# Patient Record
Sex: Male | Born: 1935
Health system: Southern US, Community
[De-identification: ages and names within clinical notes are randomized; demographics above are authoritative.]

## PROBLEM LIST (undated history)

## (undated) DIAGNOSIS — D049 Carcinoma in situ of skin, unspecified: Secondary | ICD-10-CM

## (undated) DIAGNOSIS — K5730 Diverticulosis of large intestine without perforation or abscess without bleeding: Secondary | ICD-10-CM

## (undated) DIAGNOSIS — I951 Orthostatic hypotension: Secondary | ICD-10-CM

## (undated) DIAGNOSIS — R55 Syncope and collapse: Secondary | ICD-10-CM

## (undated) DIAGNOSIS — R413 Other amnesia: Secondary | ICD-10-CM

## (undated) DIAGNOSIS — I428 Other cardiomyopathies: Secondary | ICD-10-CM

## (undated) DIAGNOSIS — D649 Anemia, unspecified: Secondary | ICD-10-CM

## (undated) DIAGNOSIS — R42 Dizziness and giddiness: Secondary | ICD-10-CM

## (undated) DIAGNOSIS — M1990 Unspecified osteoarthritis, unspecified site: Secondary | ICD-10-CM

## (undated) DIAGNOSIS — N400 Enlarged prostate without lower urinary tract symptoms: Secondary | ICD-10-CM

## (undated) DIAGNOSIS — I10 Essential (primary) hypertension: Secondary | ICD-10-CM

## (undated) DIAGNOSIS — R001 Bradycardia, unspecified: Secondary | ICD-10-CM

## (undated) DIAGNOSIS — I739 Peripheral vascular disease, unspecified: Secondary | ICD-10-CM

## (undated) DIAGNOSIS — I2510 Atherosclerotic heart disease of native coronary artery without angina pectoris: Secondary | ICD-10-CM

## (undated) DIAGNOSIS — I340 Nonrheumatic mitral (valve) insufficiency: Secondary | ICD-10-CM

## (undated) DIAGNOSIS — I493 Ventricular premature depolarization: Secondary | ICD-10-CM

## (undated) DIAGNOSIS — I4510 Unspecified right bundle-branch block: Secondary | ICD-10-CM

## (undated) HISTORY — DX: Bradycardia, unspecified: R00.1

## (undated) HISTORY — DX: Atherosclerotic heart disease of native coronary artery without angina pectoris: I25.10

## (undated) HISTORY — DX: Carcinoma in situ of skin, unspecified: D04.9

## (undated) HISTORY — DX: Peripheral vascular disease, unspecified: I73.9

## (undated) HISTORY — DX: Nonrheumatic mitral (valve) insufficiency: I34.0

## (undated) HISTORY — DX: Ventricular premature depolarization: I49.3

## (undated) HISTORY — DX: Other cardiomyopathies: I42.8

## (undated) HISTORY — PX: OTHER SURGICAL HISTORY: SHX169

## (undated) HISTORY — DX: Benign prostatic hyperplasia without lower urinary tract symptoms: N40.0

## (undated) HISTORY — DX: Dizziness and giddiness: R42

## (undated) HISTORY — DX: Other amnesia: R41.3

## (undated) HISTORY — DX: Anemia, unspecified: D64.9

## (undated) HISTORY — PX: APPENDECTOMY: SHX54

## (undated) HISTORY — DX: Essential (primary) hypertension: I10

## (undated) HISTORY — DX: Syncope and collapse: R55

## (undated) HISTORY — DX: Diverticulosis of large intestine without perforation or abscess without bleeding: K57.30

## (undated) HISTORY — DX: Unspecified right bundle-branch block: I45.10

## (undated) HISTORY — DX: Unspecified osteoarthritis, unspecified site: M19.90

## (undated) HISTORY — DX: Orthostatic hypotension: I95.1

---

## 1995-08-23 ENCOUNTER — Encounter: Payer: Self-pay | Admitting: Family Medicine

## 1997-03-24 ENCOUNTER — Encounter: Payer: Self-pay | Admitting: Family Medicine

## 1997-11-22 ENCOUNTER — Encounter: Payer: Self-pay | Admitting: Family Medicine

## 1999-02-15 ENCOUNTER — Encounter (INDEPENDENT_AMBULATORY_CARE_PROVIDER_SITE_OTHER): Payer: Self-pay | Admitting: Specialist

## 1999-02-15 ENCOUNTER — Encounter: Payer: Self-pay | Admitting: Emergency Medicine

## 1999-02-16 ENCOUNTER — Inpatient Hospital Stay (HOSPITAL_COMMUNITY): Admission: EM | Admit: 1999-02-16 | Discharge: 1999-02-17 | Payer: Self-pay | Admitting: Emergency Medicine

## 1999-04-25 ENCOUNTER — Encounter: Payer: Self-pay | Admitting: Family Medicine

## 2000-04-24 ENCOUNTER — Encounter: Payer: Self-pay | Admitting: Family Medicine

## 2001-05-22 ENCOUNTER — Encounter: Payer: Self-pay | Admitting: Family Medicine

## 2002-03-24 HISTORY — PX: OTHER SURGICAL HISTORY: SHX169

## 2002-05-23 ENCOUNTER — Encounter: Payer: Self-pay | Admitting: Family Medicine

## 2003-05-23 ENCOUNTER — Encounter: Payer: Self-pay | Admitting: Family Medicine

## 2003-05-23 LAB — CONVERTED CEMR LAB: PSA: 0.9 ng/mL

## 2004-03-24 ENCOUNTER — Encounter: Payer: Self-pay | Admitting: Family Medicine

## 2004-03-24 LAB — CONVERTED CEMR LAB: PSA: 1.15 ng/mL

## 2004-03-27 ENCOUNTER — Ambulatory Visit: Payer: Self-pay | Admitting: Family Medicine

## 2004-03-29 ENCOUNTER — Ambulatory Visit: Payer: Self-pay | Admitting: Family Medicine

## 2004-05-23 ENCOUNTER — Ambulatory Visit: Payer: Self-pay | Admitting: Family Medicine

## 2004-10-10 ENCOUNTER — Emergency Department (HOSPITAL_COMMUNITY): Admission: EM | Admit: 2004-10-10 | Discharge: 2004-10-11 | Payer: Self-pay | Admitting: Emergency Medicine

## 2004-10-23 ENCOUNTER — Ambulatory Visit: Payer: Self-pay | Admitting: Family Medicine

## 2004-11-08 ENCOUNTER — Ambulatory Visit (HOSPITAL_COMMUNITY): Admission: RE | Admit: 2004-11-08 | Discharge: 2004-11-08 | Payer: Self-pay | Admitting: Gastroenterology

## 2004-11-08 LAB — HM COLONOSCOPY

## 2004-11-27 ENCOUNTER — Ambulatory Visit: Payer: Self-pay | Admitting: Family Medicine

## 2004-11-29 ENCOUNTER — Ambulatory Visit: Payer: Self-pay | Admitting: Cardiology

## 2004-12-19 ENCOUNTER — Ambulatory Visit: Payer: Self-pay

## 2005-01-13 ENCOUNTER — Ambulatory Visit: Payer: Self-pay | Admitting: Family Medicine

## 2005-01-16 ENCOUNTER — Ambulatory Visit: Payer: Self-pay | Admitting: Family Medicine

## 2005-01-30 ENCOUNTER — Ambulatory Visit: Payer: Self-pay | Admitting: Cardiology

## 2005-04-28 ENCOUNTER — Ambulatory Visit: Payer: Self-pay | Admitting: Family Medicine

## 2005-04-29 ENCOUNTER — Ambulatory Visit: Payer: Self-pay | Admitting: Family Medicine

## 2005-08-22 ENCOUNTER — Ambulatory Visit: Payer: Self-pay | Admitting: Family Medicine

## 2005-09-07 ENCOUNTER — Emergency Department (HOSPITAL_COMMUNITY): Admission: EM | Admit: 2005-09-07 | Discharge: 2005-09-07 | Payer: Self-pay | Admitting: Emergency Medicine

## 2005-09-19 ENCOUNTER — Ambulatory Visit: Payer: Self-pay | Admitting: Family Medicine

## 2005-09-19 LAB — CONVERTED CEMR LAB: PSA: 1.37 ng/mL

## 2005-09-22 ENCOUNTER — Ambulatory Visit: Payer: Self-pay | Admitting: Family Medicine

## 2005-10-10 ENCOUNTER — Ambulatory Visit: Payer: Self-pay | Admitting: Family Medicine

## 2005-11-13 ENCOUNTER — Ambulatory Visit: Payer: Self-pay | Admitting: Family Medicine

## 2006-05-02 ENCOUNTER — Ambulatory Visit: Payer: Self-pay | Admitting: Family Medicine

## 2006-10-05 ENCOUNTER — Ambulatory Visit: Payer: Self-pay | Admitting: Family Medicine

## 2006-10-05 LAB — CONVERTED CEMR LAB
AST: 21 units/L (ref 0–37)
Albumin: 3.4 g/dL — ABNORMAL LOW (ref 3.5–5.2)
Alkaline Phosphatase: 54 units/L (ref 39–117)
Chloride: 104 meq/L (ref 96–112)
Creatinine, Ser: 1.5 mg/dL (ref 0.4–1.5)
Creatinine,U: 236.6 mg/dL
HDL: 69.6 mg/dL (ref >39.0)
PSA: 1.62 ng/mL (ref 0.10–4.00)
Sodium: 139 meq/L (ref 135–145)
TSH: 1.51 microintl units/mL (ref 0.35–5.50)
Total Bilirubin: 0.8 mg/dL (ref 0.3–1.2)
VLDL: 10 mg/dL (ref 0–40)

## 2006-10-07 ENCOUNTER — Encounter: Payer: Self-pay | Admitting: Family Medicine

## 2006-10-07 DIAGNOSIS — L719 Rosacea, unspecified: Secondary | ICD-10-CM | POA: Insufficient documentation

## 2006-10-07 DIAGNOSIS — N400 Enlarged prostate without lower urinary tract symptoms: Secondary | ICD-10-CM

## 2006-10-07 DIAGNOSIS — I739 Peripheral vascular disease, unspecified: Secondary | ICD-10-CM | POA: Insufficient documentation

## 2006-10-07 DIAGNOSIS — J309 Allergic rhinitis, unspecified: Secondary | ICD-10-CM | POA: Insufficient documentation

## 2006-10-07 DIAGNOSIS — E749 Disorder of carbohydrate metabolism, unspecified: Secondary | ICD-10-CM | POA: Insufficient documentation

## 2006-10-07 DIAGNOSIS — M109 Gout, unspecified: Secondary | ICD-10-CM

## 2006-10-07 DIAGNOSIS — K5730 Diverticulosis of large intestine without perforation or abscess without bleeding: Secondary | ICD-10-CM | POA: Insufficient documentation

## 2006-10-09 ENCOUNTER — Ambulatory Visit: Payer: Self-pay | Admitting: Family Medicine

## 2006-10-22 ENCOUNTER — Encounter (INDEPENDENT_AMBULATORY_CARE_PROVIDER_SITE_OTHER): Payer: Self-pay | Admitting: *Deleted

## 2006-10-22 ENCOUNTER — Ambulatory Visit: Payer: Self-pay | Admitting: Family Medicine

## 2006-12-28 ENCOUNTER — Telehealth: Payer: Self-pay | Admitting: Family Medicine

## 2006-12-29 ENCOUNTER — Ambulatory Visit: Payer: Self-pay | Admitting: Family Medicine

## 2006-12-29 DIAGNOSIS — R002 Palpitations: Secondary | ICD-10-CM | POA: Insufficient documentation

## 2007-03-22 ENCOUNTER — Ambulatory Visit: Payer: Self-pay | Admitting: Family Medicine

## 2007-03-22 DIAGNOSIS — K4090 Unilateral inguinal hernia, without obstruction or gangrene, not specified as recurrent: Secondary | ICD-10-CM | POA: Insufficient documentation

## 2007-04-22 ENCOUNTER — Encounter: Payer: Self-pay | Admitting: Family Medicine

## 2007-06-28 ENCOUNTER — Encounter: Admission: RE | Admit: 2007-06-28 | Discharge: 2007-06-28 | Payer: Self-pay | Admitting: Surgery

## 2007-06-30 ENCOUNTER — Encounter: Payer: Self-pay | Admitting: Family Medicine

## 2007-06-30 ENCOUNTER — Ambulatory Visit (HOSPITAL_BASED_OUTPATIENT_CLINIC_OR_DEPARTMENT_OTHER): Admission: RE | Admit: 2007-06-30 | Discharge: 2007-06-30 | Payer: Self-pay | Admitting: Surgery

## 2007-06-30 ENCOUNTER — Encounter (INDEPENDENT_AMBULATORY_CARE_PROVIDER_SITE_OTHER): Payer: Self-pay | Admitting: Surgery

## 2007-07-30 ENCOUNTER — Encounter: Payer: Self-pay | Admitting: Family Medicine

## 2007-08-20 ENCOUNTER — Encounter: Payer: Self-pay | Admitting: Family Medicine

## 2007-10-01 ENCOUNTER — Ambulatory Visit: Payer: Self-pay | Admitting: Family Medicine

## 2007-10-01 DIAGNOSIS — S46212S Strain of muscle, fascia and tendon of other parts of biceps, left arm, sequela: Secondary | ICD-10-CM

## 2007-12-29 ENCOUNTER — Telehealth: Payer: Self-pay | Admitting: Family Medicine

## 2007-12-30 ENCOUNTER — Ambulatory Visit: Payer: Self-pay | Admitting: Family Medicine

## 2008-01-04 ENCOUNTER — Ambulatory Visit: Payer: Self-pay | Admitting: Family Medicine

## 2008-01-05 DIAGNOSIS — E8809 Other disorders of plasma-protein metabolism, not elsewhere classified: Secondary | ICD-10-CM | POA: Insufficient documentation

## 2008-01-05 LAB — CONVERTED CEMR LAB
ALT: 13 units/L (ref 0–53)
Albumin: 3.3 g/dL — ABNORMAL LOW (ref 3.5–5.2)
Calcium: 9 mg/dL (ref 8.4–10.5)
Creatinine, Ser: 1.1 mg/dL (ref 0.4–1.5)
Creatinine,U: 154.4 mg/dL
GFR calc non Af Amer: 70 mL/min
HDL: 66.2 mg/dL (ref >39.0)
LDL Cholesterol: 93 mg/dL (ref 0–99)
Microalb, Ur: 0.5 mg/dL (ref 0.0–1.9)
PSA: 1.35 ng/mL (ref 0.10–4.00)
Total Bilirubin: 0.7 mg/dL (ref 0.3–1.2)
Total CHOL/HDL Ratio: 2.5
Triglycerides: 34 mg/dL (ref 0–149)

## 2008-01-13 ENCOUNTER — Encounter: Payer: Self-pay | Admitting: Family Medicine

## 2008-01-13 LAB — CONVERTED CEMR LAB
Bacteria, UA: 0
Bilirubin Urine: NEGATIVE
Blood in Urine, dipstick: NEGATIVE
Mucus, UA: 0
RBC / HPF: 0
Urine crystals, microscopic: 0 /hpf
Urobilinogen, UA: 0.2
pH: 6.5

## 2008-01-19 ENCOUNTER — Encounter (INDEPENDENT_AMBULATORY_CARE_PROVIDER_SITE_OTHER): Payer: Self-pay | Admitting: *Deleted

## 2008-01-19 ENCOUNTER — Ambulatory Visit: Payer: Self-pay | Admitting: Family Medicine

## 2008-01-19 LAB — CONVERTED CEMR LAB: OCCULT 1: NEGATIVE

## 2008-05-16 ENCOUNTER — Telehealth: Payer: Self-pay | Admitting: Family Medicine

## 2008-05-29 ENCOUNTER — Ambulatory Visit: Payer: Self-pay | Admitting: Family Medicine

## 2008-07-03 ENCOUNTER — Ambulatory Visit: Payer: Self-pay | Admitting: Family Medicine

## 2008-07-10 ENCOUNTER — Ambulatory Visit: Payer: Self-pay | Admitting: Internal Medicine

## 2008-07-10 ENCOUNTER — Encounter: Payer: Self-pay | Admitting: Internal Medicine

## 2008-07-10 DIAGNOSIS — I498 Other specified cardiac arrhythmias: Secondary | ICD-10-CM

## 2008-07-18 ENCOUNTER — Telehealth (INDEPENDENT_AMBULATORY_CARE_PROVIDER_SITE_OTHER): Payer: Self-pay | Admitting: *Deleted

## 2008-07-19 ENCOUNTER — Encounter: Payer: Self-pay | Admitting: Cardiovascular Disease

## 2008-07-19 ENCOUNTER — Ambulatory Visit: Payer: Self-pay

## 2008-07-19 ENCOUNTER — Encounter: Payer: Self-pay | Admitting: Internal Medicine

## 2008-07-19 ENCOUNTER — Telehealth: Payer: Self-pay | Admitting: Internal Medicine

## 2008-07-20 ENCOUNTER — Ambulatory Visit (HOSPITAL_COMMUNITY): Admission: RE | Admit: 2008-07-20 | Discharge: 2008-07-21 | Payer: Self-pay | Admitting: Cardiovascular Disease

## 2008-07-20 ENCOUNTER — Ambulatory Visit: Payer: Self-pay | Admitting: Cardiovascular Disease

## 2008-07-21 ENCOUNTER — Telehealth: Payer: Self-pay | Admitting: Internal Medicine

## 2008-07-24 ENCOUNTER — Ambulatory Visit: Payer: Self-pay | Admitting: Surgery

## 2008-07-24 ENCOUNTER — Telehealth: Payer: Self-pay | Admitting: Family Medicine

## 2008-07-28 ENCOUNTER — Telehealth: Payer: Self-pay | Admitting: Cardiovascular Disease

## 2008-08-25 ENCOUNTER — Ambulatory Visit: Payer: Self-pay | Admitting: Internal Medicine

## 2008-08-25 DIAGNOSIS — I080 Rheumatic disorders of both mitral and aortic valves: Secondary | ICD-10-CM | POA: Insufficient documentation

## 2008-09-04 ENCOUNTER — Ambulatory Visit: Payer: Self-pay | Admitting: Surgery

## 2008-09-20 ENCOUNTER — Telehealth: Payer: Self-pay | Admitting: Internal Medicine

## 2008-09-20 ENCOUNTER — Encounter: Payer: Self-pay | Admitting: Internal Medicine

## 2008-10-25 ENCOUNTER — Encounter (INDEPENDENT_AMBULATORY_CARE_PROVIDER_SITE_OTHER): Payer: Self-pay | Admitting: *Deleted

## 2008-10-26 ENCOUNTER — Ambulatory Visit: Payer: Self-pay | Admitting: Internal Medicine

## 2008-10-26 LAB — CONVERTED CEMR LAB
BUN: 21 mg/dL (ref 6–23)
Basophils Absolute: 0 10*3/uL (ref 0.0–0.1)
CO2: 22 meq/L (ref 19–32)
Chloride: 109 meq/L (ref 96–112)
Glucose, Bld: 90 mg/dL (ref 70–99)
HCT: 36.1 % — ABNORMAL LOW (ref 39.0–52.0)
Hemoglobin: 12.5 g/dL — ABNORMAL LOW (ref 13.0–17.0)
Lymphs Abs: 1 10*3/uL (ref 0.7–4.0)
MCHC: 34.7 g/dL (ref 30.0–36.0)
MCV: 91.4 fL (ref 78.0–100.0)
Monocytes Absolute: 0.4 10*3/uL (ref 0.1–1.0)
Neutro Abs: 1.8 10*3/uL (ref 1.4–7.7)
Platelets: 186 10*3/uL (ref 150.0–400.0)
Potassium: 4.5 meq/L (ref 3.5–5.1)
RDW: 14.2 % (ref 11.5–14.6)
Sodium: 141 meq/L (ref 135–145)
aPTT: 26.6 s (ref 21.7–28.8)

## 2008-11-02 ENCOUNTER — Encounter: Payer: Self-pay | Admitting: Cardiology

## 2008-11-02 ENCOUNTER — Ambulatory Visit (HOSPITAL_COMMUNITY): Admission: RE | Admit: 2008-11-02 | Discharge: 2008-11-02 | Payer: Self-pay | Admitting: Cardiology

## 2008-11-02 ENCOUNTER — Inpatient Hospital Stay (HOSPITAL_BASED_OUTPATIENT_CLINIC_OR_DEPARTMENT_OTHER): Admission: RE | Admit: 2008-11-02 | Discharge: 2008-11-02 | Payer: Self-pay | Admitting: Cardiology

## 2008-11-02 ENCOUNTER — Ambulatory Visit: Payer: Self-pay | Admitting: Cardiology

## 2008-11-15 ENCOUNTER — Ambulatory Visit: Payer: Self-pay | Admitting: Internal Medicine

## 2008-11-15 DIAGNOSIS — I498 Other specified cardiac arrhythmias: Secondary | ICD-10-CM | POA: Insufficient documentation

## 2008-12-11 ENCOUNTER — Ambulatory Visit: Payer: Self-pay | Admitting: Family Medicine

## 2009-01-01 ENCOUNTER — Ambulatory Visit: Payer: Self-pay | Admitting: Family Medicine

## 2009-01-08 ENCOUNTER — Ambulatory Visit: Payer: Self-pay | Admitting: Family Medicine

## 2009-01-08 LAB — CONVERTED CEMR LAB
AST: 20 units/L (ref 0–37)
Albumin: 3.4 g/dL — ABNORMAL LOW (ref 3.5–5.2)
Alkaline Phosphatase: 35 units/L — ABNORMAL LOW (ref 39–117)
BUN: 25 mg/dL — ABNORMAL HIGH (ref 6–23)
Basophils Relative: 0.9 % (ref 0.0–3.0)
CO2: 28 meq/L (ref 19–32)
Calcium: 8.5 mg/dL (ref 8.4–10.5)
Cholesterol: 171 mg/dL (ref 0–200)
Creatinine,U: 181.9 mg/dL
Eosinophils Absolute: 0.2 10*3/uL (ref 0.0–0.7)
Eosinophils Relative: 5.1 % — ABNORMAL HIGH (ref 0.0–5.0)
GFR calc non Af Amer: 57.56 mL/min (ref >60)
Glucose, Bld: 97 mg/dL (ref 70–99)
HCT: 35.5 % — ABNORMAL LOW (ref 39.0–52.0)
Lymphs Abs: 1.1 10*3/uL (ref 0.7–4.0)
MCHC: 34.5 g/dL (ref 30.0–36.0)
MCV: 95.1 fL (ref 78.0–100.0)
Microalb Creat Ratio: 3.3 mg/g (ref 0.0–30.0)
Monocytes Absolute: 0.4 10*3/uL (ref 0.1–1.0)
Neutro Abs: 2.3 10*3/uL (ref 1.4–7.7)
RBC: 3.73 M/uL — ABNORMAL LOW (ref 4.22–5.81)
Sodium: 141 meq/L (ref 135–145)
Total Protein: 5.6 g/dL — ABNORMAL LOW (ref 6.0–8.3)
VLDL: 7.2 mg/dL (ref 0.0–40.0)
WBC: 4 10*3/uL — ABNORMAL LOW (ref 4.5–10.5)

## 2009-01-09 ENCOUNTER — Encounter: Payer: Self-pay | Admitting: Family Medicine

## 2009-01-09 LAB — CONVERTED CEMR LAB: Vit D, 25-Hydroxy: 58 ng/mL (ref 30–89)

## 2009-01-10 ENCOUNTER — Ambulatory Visit: Payer: Self-pay | Admitting: Family Medicine

## 2009-01-18 ENCOUNTER — Ambulatory Visit: Payer: Self-pay | Admitting: Family Medicine

## 2009-01-18 ENCOUNTER — Encounter (INDEPENDENT_AMBULATORY_CARE_PROVIDER_SITE_OTHER): Payer: Self-pay | Admitting: *Deleted

## 2009-01-18 LAB — CONVERTED CEMR LAB
OCCULT 1: NEGATIVE
OCCULT 2: NEGATIVE
OCCULT 3: NEGATIVE

## 2009-01-18 LAB — FECAL OCCULT BLOOD, GUAIAC: Fecal Occult Blood: NEGATIVE

## 2009-03-05 ENCOUNTER — Ambulatory Visit: Payer: Self-pay | Admitting: Family Medicine

## 2009-03-06 ENCOUNTER — Encounter: Payer: Self-pay | Admitting: Family Medicine

## 2009-03-06 LAB — CONVERTED CEMR LAB
Albumin: 3.3 g/dL — ABNORMAL LOW (ref 3.5–5.2)
Alkaline Phosphatase: 52 units/L (ref 39–117)
Basophils Absolute: 0 10*3/uL (ref 0.0–0.1)
Eosinophils Absolute: 0.2 10*3/uL (ref 0.0–0.7)
Hemoglobin: 12.1 g/dL — ABNORMAL LOW (ref 13.0–17.0)
Lymphocytes Relative: 21.3 % (ref 12.0–46.0)
MCHC: 33.8 g/dL (ref 30.0–36.0)
Neutro Abs: 2.8 10*3/uL (ref 1.4–7.7)
Neutrophils Relative %: 63.6 % (ref 43.0–77.0)
Platelets: 204 10*3/uL (ref 150.0–400.0)
RDW: 12.5 % (ref 11.5–14.6)
Total Bilirubin: 1 mg/dL (ref 0.3–1.2)

## 2009-03-12 ENCOUNTER — Ambulatory Visit: Payer: Self-pay | Admitting: Family Medicine

## 2009-03-19 ENCOUNTER — Encounter: Payer: Self-pay | Admitting: Internal Medicine

## 2009-08-07 ENCOUNTER — Ambulatory Visit: Payer: Self-pay | Admitting: Family Medicine

## 2009-08-07 LAB — CONVERTED CEMR LAB
Alkaline Phosphatase: 48 units/L (ref 39–117)
Basophils Absolute: 0 10*3/uL (ref 0.0–0.1)
Bilirubin, Direct: 0.1 mg/dL (ref 0.0–0.3)
CO2: 28 meq/L (ref 19–32)
Calcium: 9.4 mg/dL (ref 8.4–10.5)
Creatinine, Ser: 1.3 mg/dL (ref 0.4–1.5)
Creatinine,U: 349.9 mg/dL
Eosinophils Absolute: 0.2 10*3/uL (ref 0.0–0.7)
Glucose, Bld: 95 mg/dL (ref 70–99)
HDL: 72.9 mg/dL (ref >39.00)
Lymphocytes Relative: 30.8 % (ref 12.0–46.0)
MCHC: 34.4 g/dL (ref 30.0–36.0)
Microalb Creat Ratio: 0.5 mg/g (ref 0.0–30.0)
Microalb, Ur: 1.8 mg/dL (ref 0.0–1.9)
Neutrophils Relative %: 52.6 % (ref 43.0–77.0)
PSA: 1.62 ng/mL (ref 0.10–4.00)
RBC: 3.81 M/uL — ABNORMAL LOW (ref 4.22–5.81)
RDW: 13.2 % (ref 11.5–14.6)
Total Bilirubin: 0.5 mg/dL (ref 0.3–1.2)
Total CHOL/HDL Ratio: 2
Triglycerides: 31 mg/dL (ref 0.0–149.0)
VLDL: 6.2 mg/dL (ref 0.0–40.0)

## 2009-08-09 ENCOUNTER — Telehealth: Payer: Self-pay | Admitting: Internal Medicine

## 2009-08-14 ENCOUNTER — Ambulatory Visit: Payer: Self-pay | Admitting: Family Medicine

## 2009-09-10 ENCOUNTER — Ambulatory Visit: Payer: Self-pay | Admitting: Internal Medicine

## 2009-10-30 ENCOUNTER — Encounter (INDEPENDENT_AMBULATORY_CARE_PROVIDER_SITE_OTHER): Payer: Self-pay | Admitting: *Deleted

## 2010-01-31 ENCOUNTER — Ambulatory Visit: Payer: Self-pay | Admitting: Family Medicine

## 2010-04-23 NOTE — Assessment & Plan Note (Signed)
Summary: CPX / LFW   Vital Signs:  Patient profile:   76 year old male Weight:      164.75 pounds BMI:     24.78 Temp:     97.8 degrees F oral Pulse rate:   84 / minute Pulse rhythm:   irregular BP sitting:   110 / 70  (left arm) Cuff size:   regular  Vitals Entered By: Sydell Axon LPN (08/30/2009 9:21 AM) CC: 30 Minute checkup, had a colonoscopy at [facility] 5-6 years ago?   History of Present Illness: Extremely pleasant pt here fo followup. He feels well and has been stable with weight loss altho he is trying to gain. He still gets slightly dizzy, typically with getting up. His pulse today is higher than usual but BP is great.  He continues with diarrhea almost all the time.    Preventive Screening-Counseling & Management  Alcohol-Tobacco     Alcohol drinks/day: 2     Alcohol type: wine beer     Smoking Status: quit     Year Quit: 1986     Pack years: 60     Passive Smoke Exposure: no  Caffeine-Diet-Exercise     Caffeine use/day: 3     Does Patient Exercise: yes     Type of exercise: walking  calisthenics  1 hour     Times/week: 4  Problems Prior to Update: 1)  Unspecified Anemia  (ICD-285.9) 2)  Premature Ventricular Contractions, Frequent  (ICD-427.69) 3)  Unspecified Systolic Heart Failure  (ICD-428.20) 4)  Mitral Regurgitation  (ICD-396.3) 5)  Bradycardia  (ICD-427.89) 6)  Tendinitis, Right Lower Leg  (ICD-726.90) 7)  Other Disorders of Plasma Protein Metabolism  (ICD-273.8) 8)  Special Screening Malignant Neoplasm of Prostate  (ICD-V76.44) 9)  Biceps Tendon Rupture, Left  (ICD-727.62) 10)  Inguinal Hernia, Left  (ICD-550.90) 11)  Palpitations  (ICD-785.1) 12)  Screening For Malignannt Neoplasm, Site Nec  (ICD-V76.49) 13)  Health Maintenance Exam  (ICD-V70.0) 14)  Rosacea  (ICD-695.3) 15)  Hearing Loss (HFHL)  (ICD-389.9) 16)  Peripheral Vascular Disease  (ICD-443.9) 17)  Hypertension ( BP ELEV SINCE 95- OFF MEDS)  (ICD-401.9) 18)  Diverticulosis, Colon   (ICD-562.10) 19)  Allergic Rhinitis  (ICD-477.9) 20)  Hyprtrphy Prostate Bng w/o Urinary Obst/luts  (ICD-600.00) 21)  Carbohydrate Metabolism Disorder  (ICD-271.9) 22)  Gout  (ICD-274.9) 23)  Hypercholesterolemia, Pure (200/ LDL 117)  (ICD-272.0)  Medications Prior to Update: 1)  Vitamin C 500 Mg  Tabs (Ascorbic Acid) .... Take One By Mouth Daily 2)  Cilostazol 100 Mg Tabs (Cilostazol) .Marland Kitchen.. 1 Tab Two Times A Day 3)  Aspirin Ec 325 Mg Tbec (Aspirin) .... Take One Tablet By Mouth Daily 4)  Calcium Carbonate-Vitamin D 600-400 Mg-Unit  Tabs (Calcium Carbonate-Vitamin D) .... Take 2 Tables Once A Day 5)  Metoprolol Succinate 25 Mg Xr24h-Tab (Metoprolol Succinate) .... Take 1/2 By Mouth Two Times A Day  Allergies: No Known Drug Allergies  Past History:  Past Medical History: Last updated: 11/15/2008 Allergic rhinitis Diverticulosis, colon frequent PVCS asymptomatic bradycardia Gout (remote per pt) Hypertension (pt denies) Peripheral vascular disease Benign prostatic hypertrophy (pt denies) Mitral regurgitation, Pulmonic regurgitation, with marked LA enlargement Nonobstructive CAD  Family History: Last updated: 08/30/09 Father: Died at the age of 1 of a cerebral henmorrhage  HTN Mother: Died at the age of 48 Natural Causes w/CHF Brother died at 29 with an enlarged heart.   Brother A 72  s/p  neck surgery.  Sister died  61  motor vehicle accident.  Prostate Cancer in family uncles  Social History: Last updated: 07/10/2008 Marital Status: Married LIves in [place], [place] Children: 4 Occupation: Production designer, theatre/television/film for [employer]. Tob- 2PPD x 30 years, quit 10/1982 ETOH- 2 beer/wine per day  Risk Factors: Alcohol Use: 2 (08/14/2009) Caffeine Use: 3 (08/14/2009) Exercise: yes (08/14/2009)  Risk Factors: Smoking Status: quit (08/14/2009) Passive Smoke Exposure: no (08/14/2009)  Past Surgical History: Colonocopy 10/95 Madilyn Fireman) S/G divertics Appendectomy11/00 DEXA (secondary  shrunk 2") wnl 07/06/03 Abd/Pelvic CT Horseshoe kidney 10/10/04 Colonoscopy L & R divertics (Dr Madilyn Fireman) 11/08/04    10 years L Herniorraphy (Dr Daphine Deutscher) 06/30/07 Distal Aortogram  Periph Vasc Dz (Dr Sanjuana Kava) 07/20/08  Family History: Father: Died at the age of 59 of a cerebral henmorrhage  HTN Mother: Died at the age of 23 Natural Causes w/CHF Brother died at 29 with an enlarged heart.   Brother A 72  s/p  neck surgery.   Sister died  9  motor vehicle accident.  Prostate Cancer in family uncles  Review of Systems General:  Complains of fatigue and weakness; denies chills, fever, sweats, and weight loss. Eyes:  Denies blurring, discharge, and eye pain. ENT:  Complains of decreased hearing; denies ear discharge and ringing in ears; progressive.. CV:  Complains of fatigue; denies chest pain or discomfort, fainting, palpitations, shortness of breath with exertion, swelling of feet, and swelling of hands. Resp:  Denies cough, shortness of breath, and wheezing. GI:  Complains of diarrhea; denies abdominal pain, bloody stools, change in bowel habits, constipation, dark tarry stools, indigestion, loss of appetite, nausea, vomiting, vomiting blood, and yellowish skin color. GU:  Complains of nocturia; denies discharge, dysuria, and urinary frequency; once. MS:  Complains of joint pain; denies low back pain, muscle aches, and stiffness; r hip at times. Derm:  Denies dryness, itching, and rash. Neuro:  Denies numbness, poor balance, tingling, and tremors; mild dizziness at times..  Physical Exam  General:  Well-developed,well-nourished,in no acute distress; alert,appropriate and cooperative throughout examination, comfortable and without problem. Head:  Normocephalic and atraumatic without obvious abnormalities. No apparent alopecia or balding. Sinuses NT. Eyes:  Conjunctiva clear bilaterally.  Ears:  External ear exam shows no significant lesions or deformities.  Otoscopic examination reveals  clear canals, tympanic membranes are intact bilaterally without bulging, retraction, inflammation or discharge. Hearing is grossly normal bilaterally. Nose:  External nasal examination shows no deformity or inflammation. Nasal mucosa are pink and moist without lesions or exudates. Mouth:  Oral mucosa and oropharynx without lesions or exudates.  Teeth in good repair. Neck:  No deformities, masses, or tenderness noted. Chest Wall:  No deformities, masses, tenderness or gynecomastia noted. Breasts:  No masses or gynecomastia noted Lungs:  Normal respiratory effort, chest expands symmetrically. Lungs are clear to auscultation, no crackles or wheezes. Heart:  Normal rate and regular rhythm. S1 and S2 normal without gallop, murmur, click, rub or other extra sounds. Occas bigeminy by auscultation. Abdomen:  Bowel sounds positive,abdomen soft and non-tender without masses, organomegaly or hernias noted. Rectal:  No external abnormalities noted. Normal sphincter tone. No rectal masses or tenderness. G neg. Genitalia:  Testes bilaterally descended without nodularity, tenderness or masses. No scrotal masses or lesions. No penis lesions or urethral discharge. Prostate:  Prostate gland firm and smooth, no enlargement, nodularity, tenderness, mass, asymmetry or induration. 10 gms. Msk:  No deformity or scoliosis noted of thoracic or lumbar spine.   Pulses:  R and L carotid,radial,femoral,dorsalis pedis and  posterior tibial pulses are full and equal bilaterally Extremities:  No clubbing, cyanosis, edema, or deformity noted with normal full range of motion of all joints.   Neurologic:  No cranial nerve deficits noted. Station and gait are normal. Sensory, motor and coordinative functions appear intact. Skin:  Focal area of pruritis w/o any lesion...has many B9 AKs on his back sporadically yet globally. Cervical Nodes:  No lymphadenopathy noted Inguinal Nodes:  No significant adenopathy Psych:  Cognition and  judgment appear intact. Alert and cooperative with normal attention span and concentration. No apparent delusions, illusions, hallucinations, good eye contact and pleasant demeanor.   Impression & Recommendations:  Problem # 1:  BRADYCARDIA (ICD-427.89) Assessment Improved  His updated medication list for this problem includes:    Cilostazol 100 Mg Tabs (Cilostazol) .Marland Kitchen... 1 tab two times a day    Aspirin Ec 325 Mg Tbec (Aspirin) .Marland Kitchen... Take one tablet by mouth daily    Metoprolol Succinate 25 Mg Xr24h-tab (Metoprolol succinate) .Marland Kitchen... Take 1/2 by mouth two times a day  Echocardiogram: Study Conclusions  1. Left ventricle: The cavity size was mildly dilated. Systolic    function was mildly to moderately reduced. The estimated ejection    fraction was 40%. 2. Aortic valve: There was no stenosis. Trivial regurgitation. 3. Aorta: Normal caliber aortic root and thoracic aorta. Grade    III-IV plaque in the arch. 4. Mitral valve: There is mild to probably moderate mitral    regurgitation. 5. Left atrium: The atrium was mildly dilated. No evidence of    thrombus in the atrial cavity or appendage. 6. Right atrium: No evidence of thrombus in the atrial cavity or    appendage. 7. Atrial septum: No defect or patent foramen ovale was identified.    Negative bubble study. Impressions:  - Mildly dilated LV with mild to moderate global systolic   dysfunction, EF 40%. There is mild to probably moderate mitral   regurgitation. Echocardiography. 2D and color Doppler. Patient status: Outpatient. Location: Endoscopy.  -------------------------------------------------------------------- (11/02/2008)  Labs Reviewed: Na: 139 (08/07/2009)   K+: 5.2 (08/07/2009)   CL: 104 (08/07/2009)   HCO3: 28 (08/07/2009) Ca: 9.4 (08/07/2009)   TSH: 2.06 (08/07/2009)   HCO3: 28 (08/07/2009)  Problem # 2:  PREMATURE VENTRICULAR CONTRACTIONS, FREQUENT (ICD-427.69) Assessment: Improved He is totally unaware  now. His updated medication list for this problem includes:    Cilostazol 100 Mg Tabs (Cilostazol) .Marland Kitchen... 1 tab two times a day    Aspirin Ec 325 Mg Tbec (Aspirin) .Marland Kitchen... Take one tablet by mouth daily    Metoprolol Succinate 25 Mg Xr24h-tab (Metoprolol succinate) .Marland Kitchen... Take 1/2 by mouth two times a day  Problem # 3:  OTHER DISORDERS OF PLASMA PROTEIN METABOLISM (ICD-273.8) Assessment: Unchanged  Problem # 4:  HEARING LOSS (HFHL) (ICD-389.9) Assessment: Unchanged Continues. Is looking at hearing aids again.  Problem # 5:  HYPERTENSION ( BP ELEV SINCE 95- OFF MEDS) (ICD-401.9) Assessment: Improved  His updated medication list for this problem includes:    Metoprolol Succinate 25 Mg Xr24h-tab (Metoprolol succinate) .Marland Kitchen... Take 1/2 by mouth two times a day  BP today: 110/70 Prior BP: 150/100 (03/12/2009)  Labs Reviewed: K+: 5.2 (08/07/2009) Creat: : 1.3 (08/07/2009)   Chol: 175 (08/07/2009)   HDL: 72.90 (08/07/2009)   LDL: 96 (08/07/2009)   TG: 31.0 (08/07/2009)  Problem # 6:  DIVERTICULOSIS, COLON (ICD-562.10) Assessment: Unchanged  Assx. Discussed being seen for prolonged LLQ pain.  Labs Reviewed: Hgb: 12.3 (08/07/2009)   Hct: 35.8 (08/07/2009)  WBC: 4.3 (08/07/2009)  Problem # 7:  HYPRTRPHY PROSTATE BNG W/O URINARY OBST/LUTS (ICD-600.00) Assessment: Unchanged Stable.  Problem # 8:  CARBOHYDRATE METABOLISM DISORDER (ICD-271.9) Assessment: Improved Sugar nml, Microalb nml.  Problem # 9:  HYPERCHOLESTEROLEMIA, PURE (200/ LDL 117) (ICD-272.0) Assessment: Unchanged Good nos. Labs Reviewed: SGOT: 20 (08/07/2009)   SGPT: 16 (08/07/2009)   HDL:72.90 (08/07/2009), 69.20 (01/08/2009)  LDL:96 (08/07/2009), 95 (01/08/2009)  Chol:175 (08/07/2009), 171 (01/08/2009)  Trig:31.0 (08/07/2009), 36.0 (01/08/2009)  Problem # 10:  GOUT (ICD-274.9) Assessment: Improved Uric acid low, hasn't had sxs and don't expect he will at curr level.  Complete Medication List: 1)  Vitamin C 500 Mg  Tabs (Ascorbic acid) .... Take one by mouth daily 2)  Cilostazol 100 Mg Tabs (Cilostazol) .Marland Kitchen.. 1 tab two times a day 3)  Aspirin Ec 325 Mg Tbec (Aspirin) .... Take one tablet by mouth daily 4)  Calcium Carbonate-vitamin D 600-400 Mg-unit Tabs (Calcium carbonate-vitamin d) .... Take 2 tables once a day 5)  Metoprolol Succinate 25 Mg Xr24h-tab (Metoprolol succinate) .... Take 1/2 by mouth two times a day  Patient Instructions: 1)  Start Citrucel 1 tsp in 4 oz of water every AM. 2)  Call in Jul for followup appt in Nov.  Current Allergies (reviewed today): No known allergies

## 2010-04-23 NOTE — Letter (Signed)
Summary: Adam Clements letter  [place] [facility]  [address] [place], [place] [address]   Phone: [phone]  Fax: [phone]       10/30/2009 MRN: [id number]  Adam Clements [address] [place], [place]  [address]  Dear Mr. Rande Brunt [facility] [place] announce the retirement of Arta Silence, M.D., from full-time practice at the [facility] office effective September 20, 2009 and his plans of returning part-time.  It is important to Dr. Hetty Ely and to our practice that you understand that [facility] has seven physicians in our office for your health care needs.  We will continue to offer the same exceptional care that you have today.    Dr. Hetty Ely has spoken to many of you about his plans for retirement and returning part-time in the fall.   We will continue to work with you through the transition to schedule appointments for you in the office and meet the high standards that [facility] is committed to.   Again, it is with great pleasure that we share the news that Dr. Hetty Ely will return to [facility] in October of 2011 with a reduced schedule.    If you have any questions, or would like to request an appointment with one of our physicians, please call us at [phone] and press the option for Scheduling an appointment.  We take pleasure in providing you with excellent patient care and look forward to seeing you at your next office visit.  Our [facility] Physicians are:  Tillman Abide, M.D. Laurita Quint, M.D. Roxy Manns, M.D. Kerby Nora, M.D. Hannah Beat, M.D. Ruthe Mannan, M.D. We proudly welcomed Raechel Ache, M.D. and Eustaquio Boyden, M.D. to the practice in July/August 2011.  Sincerely,  [facility]

## 2010-04-23 NOTE — Assessment & Plan Note (Signed)
Summary: Adam Clements per pt call/lg @ 9am   Visit Type:  6 o f/u Referring Provider:  Laurita Quint, MD Primary Provider:  Shaune Leeks MD  CC:  edema/right foot at times...no other complaints today.  History of Present Illness: The patient presents today for routine cardiology followup. He reports doing very well since last being seen in our clinic.   He exercises daily, without difficulty.  He does find that he gets lightheaded in the sauna.  He feels his PVCs have significantly decreased in frequency.  The patient denies symptoms of chest pain, shortness of breath, orthopnea, PND, lower extremity edema, dizziness, presyncope, syncope, or neurologic sequela. The patient is tolerating medications without difficulties and is otherwise without complaint today.   Current Medications (verified): 1)  Vitamin C 500 Mg  Tabs (Ascorbic Acid) .... Take One By Mouth Daily 2)  Cilostazol 100 Mg Tabs (Cilostazol) .Marland Kitchen.. 1 Tab Two Times A Day 3)  Aspirin Ec 325 Mg Tbec (Aspirin) .... Take One Tablet By Mouth Daily 4)  Calcium Carbonate-Vitamin D 600-400 Mg-Unit  Tabs (Calcium Carbonate-Vitamin D) .... Take 2 Tables Once A Day 5)  Metoprolol Succinate 25 Mg Xr24h-Tab (Metoprolol Succinate) .... Take 1/2 By Mouth Two Times A Day  Allergies (verified): No Known Drug Allergies  Past History:  Past Medical History: Allergic rhinitis Diverticulosis, colon frequent PVCS asymptomatic bradycardia Gout (remote per pt) Hypertension (pt denies) Peripheral vascular disease Benign prostatic hypertrophy (pt denies) Mitral regurgitation, Pulmonic regurgitation, with marked LA enlargement Nonobstructive CAD Nonischemic CM (EF 40%) NYHA Class I CHF  Past Surgical History: Reviewed history from 08/14/2009 and no changes required. Colonocopy 10/95 Madilyn Fireman) S/G divertics Appendectomy11/00 DEXA (secondary shrunk 2") wnl 07/06/03 Abd/Pelvic CT Horseshoe kidney 10/10/04 Colonoscopy L & R divertics (Dr Madilyn Fireman)  11/08/04    10 years L Herniorraphy (Dr Daphine Deutscher) 06/30/07 Distal Aortogram  Periph Vasc Dz (Dr Sanjuana Kava) 07/20/08  Vital Signs:  Patient profile:   76 year old male Height:      68.5 inches Weight:      164 pounds BMI:     24.66 Pulse rate:   69 / minute Pulse rhythm:   irregular BP sitting:   90 / 60  (left arm) Cuff size:   regular  Vitals Entered By: Danielle Rankin, CMA (September 10, 2009 9:07 AM)  Physical Exam  General:  Well developed, well nourished, in no acute distress. Head:  normocephalic and atraumatic Eyes:  PERRLA/EOM intact; conjunctiva and lids normal. Mouth:  Teeth, gums and palate normal. Oral mucosa normal. Neck:  Neck supple, no JVD. No masses, thyromegaly or abnormal cervical nodes. Lungs:  Clear bilaterally to auscultation and percussion. Heart:  Non-displaced PMI, chest non-tender; regular rate and rhythm, S1, S2 without murmurs, rubs or gallops. Carotid upstroke normal, no bruit. Normal abdominal aortic size, no bruits. Femorals normal pulses, no bruits. Pedals normal pulses. No edema, no varicosities. Abdomen:  Bowel sounds positive; abdomen soft and non-tender without masses, organomegaly, or hernias noted. No hepatosplenomegaly. Msk:  Back normal, normal gait. Muscle strength and tone normal. Pulses:  R DP/PT pulses 1+ Skin:  Intact without lesions or rashes. Cervical Nodes:  no significant adenopathy Psych:  Normal affect.   EKG  Procedure date:  09/10/2009  Findings:      sinus rhyhtm 70 bpm,  RBBB  Impression & Recommendations:  Problem # 1:  UNSPECIFIED SYSTOLIC HEART FAILURE (ICD-428.20) Chronic systolic dysfunction appears stable.  He has NYHA Class I symptoms. He did not take lisinopril as  directed, though presently, his BP limits this. No changes today  I have advised him to avoid hottubs/ saunas due to concerns for hypotension and syncope  Problem # 2:  PERIPHERAL VASCULAR DISEASE (ICD-443.9) stable no changes today regular daily walking  is advised  Problem # 3:  PREMATURE VENTRICULAR CONTRACTIONS, FREQUENT (ICD-427.69) much improved with toprol we will reassess EF with echo upon return  Patient Instructions: 1)  Your physician recommends that you schedule a follow-up appointment in: 9 months with Dr Johney Frame 2)  [employer] Hot tubs

## 2010-04-23 NOTE — Progress Notes (Signed)
Summary: Question about medication  Phone Note Call from Patient Call back at Home Phone [phone]   Caller: Patient Summary of Call: Pt have question regarding his medication Pletal 100mg  Initial call taken by: Judie Grieve,  Aug 09, 2009 9:42 AM  Follow-up for Phone Call        the med is two times a day pt will call if there is a problem when he gets to pharmacy Dennis Bast, RN, BSN  Aug 10, 2009 9:33 AM

## 2010-04-23 NOTE — Assessment & Plan Note (Signed)
Summary: f/u per md/dlo   Vital Signs:  Patient profile:   76 year old male Weight:      160 pounds Temp:     97.5 degrees F oral Pulse rate:   72 / minute Pulse rhythm:   irregular BP sitting:   108 / 64  (left arm) Cuff size:   regular  Vitals Entered By: Sydell Axon LPN (January 31, 2010 9:05 AM) CC: follow-up visit   History of Present Illness: Pt here for followup. He feels well and overall everything seems better.  His brother had melanoma recently and he is aware of risk. He has one lesion on the left ear that comes and goes. He was in the sun as a youth on the farm a lot, the brother did more of that into his adult years. He is travelling to [place] ponce a week and notices dry skin there.  Problems Prior to Update: 1)  Premature Ventricular Contractions, Frequent  (ICD-427.69) 2)  Unspecified Systolic Heart Failure  (ICD-428.20) 3)  Mitral Regurgitation  (ICD-396.3) 4)  Bradycardia  (ICD-427.89) 5)  Tendinitis, Right Lower Leg  (ICD-726.90) 6)  Other Disorders of Plasma Protein Metabolism  (ICD-273.8) 7)  Special Screening Malignant Neoplasm of Prostate  (ICD-V76.44) 8)  Biceps Tendon Rupture, Left  (ICD-727.62) 9)  Inguinal Hernia, Left  (ICD-550.90) 10)  Palpitations  (ICD-785.1) 11)  Screening For Malignannt Neoplasm, Site Nec  (ICD-V76.49) 12)  Health Maintenance Exam  (ICD-V70.0) 13)  Rosacea  (ICD-695.3) 14)  Hearing Loss (HFHL)  (ICD-389.9) 15)  Peripheral Vascular Disease  (ICD-443.9) 16)  Hypertension ( BP ELEV SINCE 95- OFF MEDS)  (ICD-401.9) 17)  Diverticulosis, Colon  (ICD-562.10) 18)  Allergic Rhinitis  (ICD-477.9) 19)  Hyprtrphy Prostate Bng w/o Urinary Obst/luts  (ICD-600.00) 20)  Carbohydrate Metabolism Disorder  (ICD-271.9) 21)  Gout  (ICD-274.9) 22)  Hypercholesterolemia, Pure (200/ LDL 117)  (ICD-272.0)  Medications Prior to Update: 1)  Vitamin C 500 Mg  Tabs (Ascorbic Acid) .... Take One By Mouth Daily 2)  Cilostazol 100 Mg Tabs  (Cilostazol) .Marland Kitchen.. 1 Tab Two Times A Day 3)  Aspirin Ec 325 Mg Tbec (Aspirin) .... Take One Tablet By Mouth Daily 4)  Calcium Carbonate-Vitamin D 600-400 Mg-Unit  Tabs (Calcium Carbonate-Vitamin D) .... Take 2 Tables Once A Day 5)  Metoprolol Succinate 25 Mg Xr24h-Tab (Metoprolol Succinate) .... Take 1/2 By Mouth Two Times A Day  Current Medications (verified): 1)  Vitamin C 500 Mg  Tabs (Ascorbic Acid) .... Take One By Mouth Daily 2)  Cilostazol 100 Mg Tabs (Cilostazol) .Marland Kitchen.. 1 Tab Two Times A Day 3)  Aspirin Ec 325 Mg Tbec (Aspirin) .... Take One Tablet By Mouth Daily 4)  Calcium Carbonate-Vitamin D 600-400 Mg-Unit  Tabs (Calcium Carbonate-Vitamin D) .... Take 2 Tables Once A Day 5)  Metoprolol Succinate 25 Mg Xr24h-Tab (Metoprolol Succinate) .... Take 1/2 By Mouth Two Times A Day 6)  Citrucel  Powd (Methylcellulose (Laxative)) .... One Tablespoon Daily As Directed  Allergies: No Known Drug Allergies  Physical Exam  General:  Well-developed,well-nourished,in no acute distress; alert,appropriate and cooperative throughout examination, comfortable and without problem. Head:  Normocephalic and atraumatic without obvious abnormalities. No apparent alopecia or balding. Sinuses NT. Eyes:  Conjunctiva clear bilaterally.  Ears:  External ear exam shows no significant lesions or deformities.  Otoscopic examination reveals clear canals, tympanic membranes are intact bilaterally without bulging, retraction, inflammation or discharge. Hearing is grossly normal bilaterally. Nose:  External nasal examination shows no deformity or  inflammation. Nasal mucosa are pink and moist without lesions or exudates. Mouth:  Oral mucosa and oropharynx without lesions or exudates.  Teeth in good repair. Neck:  No deformities, masses, or tenderness noted. Lungs:  Normal respiratory effort, chest expands symmetrically. Lungs are clear to auscultation, no crackles or wheezes. Heart:  Normal rate and regular rhythm. S1  and S2 normal without gallop, murmur, click, rub or other extra sounds. Occas bigeminy by auscultation. Skin:  L ear with maculopapular irritated spot 5mm diam inside ear.   Impression & Recommendations:  Problem # 1:  PREMATURE VENTRICULAR CONTRACTIONS, FREQUENT (ICD-427.69) Assessment Improved Cont as is. His updated medication list for this problem includes:    Cilostazol 100 Mg Tabs (Cilostazol) .Marland Kitchen... 1 tab two times a day    Aspirin Ec 325 Mg Tbec (Aspirin) .Marland Kitchen... Take one tablet by mouth daily    Metoprolol Succinate 25 Mg Xr24h-tab (Metoprolol succinate) .Marland Kitchen... Take 1/2 by mouth two times a day  Problem # 2:  TENDINITIS, RIGHT LOWER LEG (ICD-726.90) Assessment: Improved Discussed muscle aches.  Problem # 3:  SCREENING FOR MALIGNANNT NEOPLASM, SITE NEC (ICD-V76.49) Assessment: Unchanged Melanoma in bro. Make appt with Derm for general check and look at lesion on left ear.  Problem # 4:  HYPERTENSION ( BP ELEV SINCE 95- OFF MEDS) (ICD-401.9) Assessment: Unchanged  Stable. His updated medication list for this problem includes:    Metoprolol Succinate 25 Mg Xr24h-tab (Metoprolol succinate) .Marland Kitchen... Take 1/2 by mouth two times a day  BP today: 108/64 Prior BP: 90/60 (09/10/2009)  Labs Reviewed: K+: 5.2 (08/07/2009) Creat: : 1.3 (08/07/2009)   Chol: 175 (08/07/2009)   HDL: 72.90 (08/07/2009)   LDL: 96 (08/07/2009)   TG: 31.0 (08/07/2009)  Complete Medication List: 1)  Vitamin C 500 Mg Tabs (Ascorbic acid) .... Take one by mouth daily 2)  Cilostazol 100 Mg Tabs (Cilostazol) .Marland Kitchen.. 1 tab two times a day 3)  Aspirin Ec 325 Mg Tbec (Aspirin) .... Take one tablet by mouth daily 4)  Calcium Carbonate-vitamin D 600-400 Mg-unit Tabs (Calcium carbonate-vitamin d) .... Take 2 tables once a day 5)  Metoprolol Succinate 25 Mg Xr24h-tab (Metoprolol succinate) .... Take 1/2 by mouth two times a day 6)  Citrucel Powd (Methylcellulose (laxative)) .... One tablespoon daily as directed  Patient  Instructions: 1)  RTC early June for Comp Exam, labs prior 2)  For dry skin, use Eucerin Cream as often as desired. 3)  If baad, use Amlactin once a day. 4)  Get appt with Derm. Call if needs referral.   Orders Added: 1)  Est. Patient Level III [16109]    Current Allergies (reviewed today): No known allergies

## 2010-06-29 LAB — POCT I-STAT 3, VENOUS BLOOD GAS (G3P V)
Bicarbonate: 22.4 mEq/L (ref 20.0–24.0)
O2 Saturation: 70 %
TCO2: 24 mmol/L (ref 0–100)
pCO2, Ven: 41.2 mmHg — ABNORMAL LOW (ref 45.0–50.0)
pH, Ven: 7.343 — ABNORMAL HIGH (ref 7.250–7.300)
pO2, Ven: 39 mmHg (ref 30.0–45.0)

## 2010-06-29 LAB — POCT I-STAT 3, ART BLOOD GAS (G3+)
pCO2 arterial: 38.7 mmHg (ref 35.0–45.0)
pH, Arterial: 7.37 (ref 7.350–7.450)
pO2, Arterial: 71 mmHg — ABNORMAL LOW (ref 80.0–100.0)

## 2010-07-03 LAB — CBC
HCT: 33.2 % — ABNORMAL LOW (ref 39.0–52.0)
HCT: 36.8 % — ABNORMAL LOW (ref 39.0–52.0)
Hemoglobin: 11.5 g/dL — ABNORMAL LOW (ref 13.0–17.0)
Hemoglobin: 12.6 g/dL — ABNORMAL LOW (ref 13.0–17.0)
MCV: 91.5 fL (ref 78.0–100.0)
MCV: 91.6 fL (ref 78.0–100.0)
Platelets: 195 10*3/uL (ref 150–400)
Platelets: 221 10*3/uL (ref 150–400)
RDW: 13.3 % (ref 11.5–15.5)
WBC: 5.6 10*3/uL (ref 4.0–10.5)

## 2010-07-03 LAB — POCT I-STAT, CHEM 8
BUN: 23 mg/dL (ref 6–23)
Calcium, Ion: 1.22 mmol/L (ref 1.12–1.32)
Chloride: 107 mEq/L (ref 96–112)
Creatinine, Ser: 1.1 mg/dL (ref 0.4–1.5)
TCO2: 22 mmol/L (ref 0–100)

## 2010-07-03 LAB — BASIC METABOLIC PANEL
BUN: 19 mg/dL (ref 6–23)
Chloride: 111 mEq/L (ref 96–112)
GFR calc non Af Amer: >60 mL/min (ref >60)
Glucose, Bld: 97 mg/dL (ref 70–99)
Potassium: 4.2 mEq/L (ref 3.5–5.1)
Sodium: 141 mEq/L (ref 135–145)

## 2010-07-03 LAB — HEMOGLOBIN AND HEMATOCRIT, BLOOD
HCT: 33.6 % — ABNORMAL LOW (ref 39.0–52.0)
Hemoglobin: 11.6 g/dL — ABNORMAL LOW (ref 13.0–17.0)

## 2010-07-17 ENCOUNTER — Encounter: Payer: Self-pay | Admitting: Internal Medicine

## 2010-07-18 ENCOUNTER — Encounter: Payer: Self-pay | Admitting: Internal Medicine

## 2010-07-18 ENCOUNTER — Ambulatory Visit (INDEPENDENT_AMBULATORY_CARE_PROVIDER_SITE_OTHER): Payer: Medicare Other | Admitting: Internal Medicine

## 2010-07-18 DIAGNOSIS — I5020 Unspecified systolic (congestive) heart failure: Secondary | ICD-10-CM | POA: Insufficient documentation

## 2010-07-18 DIAGNOSIS — I7389 Other specified peripheral vascular diseases: Secondary | ICD-10-CM

## 2010-07-18 DIAGNOSIS — I4949 Other premature depolarization: Secondary | ICD-10-CM

## 2010-07-18 DIAGNOSIS — I519 Heart disease, unspecified: Secondary | ICD-10-CM

## 2010-07-18 DIAGNOSIS — I5022 Chronic systolic (congestive) heart failure: Secondary | ICD-10-CM

## 2010-07-18 DIAGNOSIS — I739 Peripheral vascular disease, unspecified: Secondary | ICD-10-CM

## 2010-07-18 NOTE — Patient Instructions (Signed)
Your physician wants you to follow-up in: 12 months with Dr Jacquiline Doe will receive a reminder letter in the mail two months in advance. If you don't receive a letter, please call our office to schedule the follow-up appointment.  Your physician has requested that you have an echocardiogram. Echocardiography is a painless test that uses sound waves to create images of your heart. It provides your doctor with information about the size and shape of your heart and how well your heart's chambers and valves are working. This procedure takes approximately one hour. There are no restrictions for this procedure.   Your physician recommends that you return for fasting lab work the same day as echo  Lipid and liver  443.89

## 2010-07-18 NOTE — Assessment & Plan Note (Signed)
Nonischemic CM (EF 40%) NYHA Class I/II  He has not tolerated low dose lisinopril previously due to significant dizziness. Continue toprol We will repeat an echo .

## 2010-07-18 NOTE — Progress Notes (Signed)
The patient presents today for routine cardiology followup. He reports doing very well since last being seen in our clinic.   He exercises daily, without difficulty.  He does still find that he gets lightheaded in the sauna.  He is reluctant to stop using the sauna despite my recommendation.  He feels his PVCs have pretty much resolved.   His claudication has also significantly improved with pletal.   The patient denies symptoms of chest pain, shortness of breath, orthopnea, PND, lower extremity edema, dizziness, presyncope, syncope, or neurologic sequela. The patient is tolerating medications without difficulties and is otherwise without complaint today.    Past Medical History  Diagnosis Date  . Allergic rhinitis   . Diverticulosis of colon   . PVC (premature ventricular contraction)   . Bradycardia   . Gout   . HTN (hypertension)   . PVD (peripheral vascular disease)     followed by Dr Myra Gianotti  . BPH (benign prostatic hypertrophy)   . Mitral regurgitation     Pulmonic regurgitation, with marked LA enlargement  . CAD (coronary artery disease)     nonobstructive  . Nonischemic cardiomyopathy     EF 40%   Past Surgical History  Procedure Date  . Appendectomy   . Dexa (secondary shrunk 2") wnl 07/06/03   . Abd/pelvic ct horseshoe kidney 10/10/04     Current Outpatient Prescriptions  Medication Sig Dispense Refill  . Ascorbic Acid (VITAMIN C) 500 MG tablet Take 500 mg by mouth daily.        Marland Kitchen aspirin 325 MG tablet Take 325 mg by mouth daily.        Marland Kitchen CALCIUM-VITAMIN D PO Take by mouth as directed.        . cilostazol (PLETAL) 100 MG tablet Take 100 mg by mouth 2 (two) times daily.        . metoprolol succinate (TOPROL-XL) 25 MG 24 hr tablet 1/2 tab po bid         No Known Allergies  History   Social History  . Marital Status: Married    Spouse Name: N/A    Number of Children: N/A  . Years of Education: N/A   Occupational History  . Not on file.   Social History Main  Topics  . Smoking status: Former Games developer  . Smokeless tobacco: Not on file  . Alcohol Use: No  . Drug Use: No  . Sexually Active: Not on file   Other Topics Concern  . Not on file   Social History Narrative  . No narrative on file    Family History  Problem Relation Age of Onset  . Hypertension      ROS-  All systems are reviewed and are negative except as outlined in the HPI above    Physical Exam: Filed Vitals:   07/18/10 1240  BP: 118/70  Pulse: 60  Height: 5\' 10"  (1.778 m)  Weight: 167 lb (75.751 kg)    GEN- The patient is well appearing, alert and oriented x 3 today.   Head- normocephalic, atraumatic Eyes-  Sclera clear, conjunctiva pink Ears- hearing intact Oropharynx- clear Neck- supple, no JVP Lymph- no cervical lymphadenopathy Lungs- Clear to ausculation bilaterally, normal work of breathing Heart- Regular rate and rhythm, no murmurs, rubs or gallops, PMI not laterally displaced GI- soft, NT, ND, + BS Extremities- no clubbing, cyanosis, or edema, extremities warm and cool MS- no significant deformity or atrophy Skin- no rash or lesion Psych- euthymic mood, full affect Neuro-  strength and sensation are intact  Assessment and Plan:

## 2010-07-18 NOTE — Assessment & Plan Note (Signed)
Stable on pletal.  He does not smoke at this time. Continue regular exercise.  We will check fasting lipids and LFTs.  He will need statin (crestor) if LDL >70.

## 2010-07-18 NOTE — Assessment & Plan Note (Signed)
Improved Continue toprol

## 2010-07-30 ENCOUNTER — Ambulatory Visit (HOSPITAL_COMMUNITY): Payer: Medicare Other | Attending: Internal Medicine

## 2010-07-30 ENCOUNTER — Other Ambulatory Visit (INDEPENDENT_AMBULATORY_CARE_PROVIDER_SITE_OTHER): Payer: Medicare Other | Admitting: *Deleted

## 2010-07-30 DIAGNOSIS — I5022 Chronic systolic (congestive) heart failure: Secondary | ICD-10-CM | POA: Insufficient documentation

## 2010-07-30 DIAGNOSIS — I7389 Other specified peripheral vascular diseases: Secondary | ICD-10-CM

## 2010-07-30 DIAGNOSIS — I428 Other cardiomyopathies: Secondary | ICD-10-CM

## 2010-07-30 DIAGNOSIS — I509 Heart failure, unspecified: Secondary | ICD-10-CM

## 2010-07-30 LAB — HEPATIC FUNCTION PANEL
AST: 21 U/L (ref 0–37)
Albumin: 3.2 g/dL — ABNORMAL LOW (ref 3.5–5.2)
Alkaline Phosphatase: 51 U/L (ref 39–117)
Total Protein: 5.2 g/dL — ABNORMAL LOW (ref 6.0–8.3)

## 2010-07-30 LAB — LIPID PANEL: Cholesterol: 161 mg/dL (ref 0–200)

## 2010-08-02 ENCOUNTER — Other Ambulatory Visit: Payer: Self-pay | Admitting: *Deleted

## 2010-08-02 MED ORDER — CILOSTAZOL 100 MG PO TABS: 100.0000 mg | ORAL_TABLET | Freq: Two times a day (BID) | ORAL | Status: DC

## 2010-08-06 NOTE — Discharge Summary (Signed)
NAME:  DENZAL, MEIR NO.:  [id number]   MEDICAL RECORD NO.:  [id number]          PATIENT TYPE:  OIB   LOCATION:  2018                         FACILITY:  [facility]   PHYSICIAN:  Pricilla Riffle, MD, FACCDATE OF BIRTH:  12-20-1934   DATE OF ADMISSION:  07/20/2008  DATE OF DISCHARGE:  07/21/2008                               DISCHARGE SUMMARY   PROCEDURES:  Distal aortogram with bilateral lower extremity runoff.   PRIMARY FINAL DISCHARGE DIAGNOSIS:  Peripheral vascular disease.   SECONDARY DIAGNOSES:  1. Orthostatic hypotension.  2. History of bradycardia.  3. Allergic rhinitis.  4. Diverticulosis.  5. Gout.  6. Benign prostatic hyperplasia.  7. Status post appendectomy, colonoscopy.   TIME OF DISCHARGE:  35 minutes.   HOSPITAL COURSE:  Mr. Brissett is a 76 year old male with no previous  history of coronary artery disease.  He was evaluated by Dr. Johney Frame for  bradycardia.  At that time, a stress test showed of thinning in the  inferior wall with a question of trivial ischemia.  That study was not  gaited.  A echocardiogram showed an EF of 40% with no wall motion  abnormalities.  He had pseudonormal left ventricular filling pattern  with uncommitted abnormal relaxation and increased filling pressures,  grade 2 diastolic dysfunction.  ABIs showed an ABI of 0.37 on the right  and 0.62 on the left.  He was scheduled for an aortogram.  He came to  the hospital for this on July 20, 2008.   His renal arteries were without stenosis bilaterally.  The right common  iliac had plaque and the right common femoral head of 50% calcified  stenosis.  The left common iliac had plaque in the internal iliac was  40% stenosed.  The left common femoral artery was at least 50% stenosed.  The right mid SFA was 100% occluded with reconstitution via collaterals.  Only small collateral supply the right foot.  The left mid SFA was 40%,  distal 50%, and the left tibioperoneal was 100%  occluded.  The left  anterior tibial artery was occluded as well.  Dr. Clifton Loren evaluated the  films and felt that it was unlikely the PTA and stent of the right SFA  would improve flow to the right foot.  Dr. Myra Gianotti was contacted and  agreed to see Mr. Bernette Mayers on Jul 24, 2008.   Postprocedure, he was hypotensive with a systolic blood pressure in the  70s despite IV fluid boluses.  Hydration was continued and he was  admitted overnight.   On July 21, 2008, his vital signs initially were orthostatic, but the  systolic blood pressure going from 138 to 84 with going from lying to  standing.  He was given an additional 500 mL fluid bolus and the IV  fluids were increased.  After the boluses, his systolic blood pressure  went from 98 to 90 when going from lying to standing.  His heart rate  was stable and there was no dizziness or presyncope with ambulation.  He  was evaluated by Dr. Tenny Craw who felt he was stable for discharge  home with  outpatient followup arranged.   DISCHARGE INSTRUCTIONS:  His activity level is to be increased gradually  with no lifting for a week.  No driving for 2 days.  He is to call our  office for any problems with the cath site.  He is to stick to a low-  sodium, heart-healthy diet.  He is to follow up with Dr. Myra Gianotti as  scheduled.  He is to follow up with Dr. Hetty Ely, Dr. Clifton Sidi, and Dr.  Johney Frame as needed or as scheduled.   DISCHARGE MEDICATIONS:  1. Calcium and vitamin D daily.  2. Vitamin C 500 mg daily.  3. Aspirin 81 mg 2 tablets daily.  4. Pletal 100 mg b.i.d.      Theodore Demark, PA-C      Pricilla Riffle, MD, [facility]  Electronically Signed    RB/MEDQ  D:  07/21/2008  T:  07/22/2008  Job:  [id number]   cc:   Jorge Ny, MD  Arta Silence, MD  Hillis Range, MD

## 2010-08-06 NOTE — Cardiovascular Report (Signed)
NAME:  Adam Clements, HUPFER NO.:  [id number]   MEDICAL RECORD NO.:  [id number]          PATIENT TYPE:  AMB   LOCATION:  ENDO                         FACILITY:  [facility]   PHYSICIAN:  Marca Ancona, MD      DATE OF BIRTH:  Mar 01, 1935   DATE OF PROCEDURE:  DATE OF DISCHARGE:                            CARDIAC CATHETERIZATION   PRIMARY CARDIOLOGIST:  Hillis Range, MD   PROCEDURE:  1. Left heart catheterization.  2. Right heart catheterization.  3. Coronary angiography  4. Left ventriculography.   INDICATIONS:  This is a 76 year old with history of peripheral arterial  disease, frequent PVCs, and decreased LV systolic function noted by  surface echo and transesophageal echocardiogram.  Right heart  catheterization was done to assess filling pressures and left heart  catheterization was done to assess for coronary artery disease because  of his cardiomyopathy.   PROCEDURE NOTE:  After informed consent was obtained, the right groin  was sterilely prepped and draped.  Lidocaine 1% was used to locally  anesthetize the right groin area.  The right common femoral vein was  entered using Seldinger technique and a 7-French venous sheath was  placed.  The right femoral artery was accessed using Seldinger technique  and a 4-French arterial sheath was placed.  The right heart  catheterization was carried out without complication and samples were  removed from the pulmonary artery for saturation measurement.  The left  heart catheterization was then performed.  The left coronary artery was  engaged using the 4-French JL-4 catheter.  The right coronary artery was  engaged using a 4-French 3-DRC catheter and the left ventricle was  entered using the 4-French angled pigtail catheter.  The patient was  noted to have frequent PVCs and bigeminy during the procedure.   FINDINGS:  1. Hemodynamics:  Mean right atrial pressure 2 mmHg, RV 26/6, PA 25/18      with a mean PA pressure of 16  mmHg, mean pulmonary capillary wedge      pressure 5 mmHg, LV 148/9, aorta 136/99.  Cardiac output 5.5 L/min.      Cardiac index 3.1.  Mixed venous O2 saturation is 70%.  Aortic      saturation 94%.  2. Left ventriculography:  The EF was noted to be 40-45% with mild      global hypokinesis.  Determination of EF is difficult due to      frequent PVCs.  There was mild mitral regurgitation  3. Right coronary artery:  The right coronary artery was dominant      vessel.  There was a 30-40% mid RCA stenosis.  4. Left main:  The left main coronary artery had minimal luminal      irregularities.  5. Left circumflex system:  The left circumflex had a large first      obtuse marginal and 2 other smaller obtuse marginals.  There was      about a 30% midvessel stenosis in the large first obtuse marginal.  6. Left anterior descending system:  The LAD is a large vessel.  There  is significant calcification in the proximal vessel, but minimal 10-      20% angiographic stenosis.  There was a large first diagonal with      no significant disease.  Just beyond the first diagonal of the      proximal LAD, there is about a 30% stenosis.  The remainder of the      LAD had only luminal irregularities.   ASSESSMENT/PLAN:  This is a 76 year old with frequent premature  ventricular contractions and mildly decreased systolic function measured  40-45% by left ventriculography and 40% by echo.  He has no obstructive  coronary artery disease, therefore his cardiomyopathy is likely  nonischemic.  His right and left heart filling pressures are normal.  It  may be that his cardiomyopathy results from his very frequent PVCs and  bigeminy.      Marca Ancona, MD  Electronically Signed     Marca Ancona, MD  Electronically Signed    DM/MEDQ  D:  11/02/2008  T:  11/03/2008  Job:  [id number]   cc:   Hillis Range, MD

## 2010-08-06 NOTE — Procedures (Signed)
VASCULAR LAB EXAM   INDICATION:  Evaluation of lower extremity veins prior to lower  extremity bypass graft.   HISTORY:  Diabetes:  No.  Cardiac:  No.  Hypertension:  No.   EXAM:  Bilateral greater saphenous vein mapping.   IMPRESSION:  1. Greater saphenous veins were identified and mapped bilaterally.  2. Measurements are on the affixed page.  3. Greater saphenous veins are of acceptable caliber for use as a      bypass graft conduit.   ___________________________________________  V. Charlena Cross, MD   MC/MEDQ  D:  07/24/2008  T:  07/24/2008  Job:  [id number]

## 2010-08-06 NOTE — Assessment & Plan Note (Signed)
OFFICE VISIT   MONNIE, ANSPACH  DOB:  [birth year]                                       09/04/2008  CHART#:[id number]   REASON FOR VISIT:  Followup claudication.   HISTORY:  This is a 76 year old gentleman I saw at the request Dr.  Clifton Hattie in early May for evaluation of right leg claudication.  The  patient had undergone an arteriogram which showed an occluded right  superficial femoral artery and occlusion of all of his runoff vessels.  He came for discussion of possible blind segment fem-pop bypass.  We had  started him on Pletal after his arteriogram.  When he came in, he had  dramatic improvement in his symptoms in his right leg.  For that reason  we elected to continue to manage him medically.  He comes back in 6  weeks later for further evaluation to see if he is continuing to  improve.  The patient states that he is getting better and better with  each day.  He does complain of a burning pain which begins in his hip  and goes down to his foot which starts after walking.  Does not have any  ulcers.  He does not have any rest pain.   PHYSICAL EXAMINATION:  VITAL SIGNS:  Blood pressure 102/58, pulse is 47.  GENERAL:  Well-appearing, in no distress.  EXTREMITIES:  His legs are warm and well perfused.  His has prominent  varicosities in the right leg.  There are no ulcerations.   ASSESSMENT/PLAN:  Bilateral claudication, right greater than left.   PLAN:  The patient has had an excellent response with Pletal.  I think  we should continue to manage him medically.  I would not recommend  bypass at this time.  I am going to have him come back to see me in 6  months.  He will call me if he is doing well at 6 months and does not  need to come back until later date.  I have given him another  prescription for cilostazol as he wanted to try the generic equivalent.   Jorge Ny, MD  Electronically Signed   VWB/MEDQ  D:  09/03/2008  T:  09/05/2008   Job:  [id number]   cc:   Verne Carrow, MD

## 2010-08-06 NOTE — Assessment & Plan Note (Signed)
OFFICE VISIT   Adam Clements, Adam Clements  DOB:  Apr 17, 1934                                       07/24/2008  CHART#:[id number]   REFERRING PHYSICIAN:  Verne Carrow, MD   REASON FOR VISIT:  Evaluate right leg pain.   HISTORY:  This is 76 year old gentleman I am seeing at the request of  Dr. Clifton Zackrey for evaluation of right leg claudication.  Several weeks  ago, the patient developed an acute change in his claudication.  He  began having trouble walking, doing his normal daily activities.  His  right leg was the only leg affected.  Pain improved with rest.  He had  an ultrasound that showed an ankle brachial index of 0.37 on the right  and 0.62 on the left.  He underwent arteriogram on 04/29 which showed an  occluded right superficial femoral artery with occlusion of all of his  runoff vessels.  No percutaneous intervention was performed.  The  patient is referred to me for discussion of possible bypass.  He was  subsequently started on Pletal.  He states he is now walking  approximately three-quarters of a mile before he develops right calf  pain.   PAST MEDICAL HISTORY:  Significant orthostatic hypotension, history of  bradycardia, allergic rhinitis, diverticulosis, gout, benign prostatic  hypertrophy, history of appendectomy, peripheral vascular disease.   MEDICATIONS:  Include calcium, vitamin C, aspirin, Pletal.   ALLERGIES:  None.   SURGICAL HISTORY:  Appendectomy.   SOCIAL HISTORY:  Negative for tobacco.  Occasional alcohol use.   PHYSICAL EXAMINATION:  VITAL SIGNS:  He is afebrile, hemodynamically  stable.  GENERAL:  Well-appearing, no distress.  HEENT:  Normocephalic, atraumatic.  Pupils equal.  Sclerae anicteric.  NECK:  Supple.  No JVD.  CARDIOVASCULAR:  Regular rate and rhythm.  PULMONARY:  Clear bilaterally.  EXTREMITIES:  Warm and well-perfused.  There is no rash, no ulceration.  Mild redness on the dorsum of his right foot.  Motor  and sensory  function are intact.  NEURO:  Cranial nerves II-XII grossly intact, nonfocal exam.  PSYCHIATRIC:  He is alert and oriented x3.  SKIN:  Without rash.   DIAGNOSTIC STUDIES:  I have reviewed the patient's arteriogram.  He has  an occluded right superficial femoral artery with reconstitution of  popliteal artery above the knee.  All his runoff vessels are occluded.   PLAN:  Given the patient has had a good response to Pletal, I would like  to continue to monitor his claudication.  We should consider a blind  segment above-knee femoral popliteal bypass graft.  However, since he  has symptomatic improvement with Pletal, I think we should delay this.  He was vein mapped today.  He has adequate vein should we need to  operate on him.  The patient is in agreement with this plan.  He is  going to see me back in 6 weeks.   Adam Ny, MD  Electronically Signed   VWB/MEDQ  D:  07/24/2008  T:  07/25/2008  Job:  [id number]   cc:   Verne Carrow, MD

## 2010-08-06 NOTE — Op Note (Signed)
NAME:  Adam Clements, Adam Clements               ACCOUNT NO.:  [id number]   MEDICAL RECORD NO.:  [id number]          PATIENT TYPE:  AMB   LOCATION:  DSC                          FACILITY:  [facility]   PHYSICIAN:  Thornton Park. Daphine Deutscher, MD  DATE OF BIRTH:  1934/06/10   DATE OF PROCEDURE:  06/30/2007  DATE OF DISCHARGE:                               OPERATIVE REPORT   PREOPERATIVE DIAGNOSIS:  Left inguinal hernia.   POSTOPERATIVE DIAGNOSIS:  Left indirect inguinal hernia.   PROCEDURE:  Left inguinal hernia repair, open, using ULTRAPRO mesh.   SURGEON:  Thornton Park. Daphine Deutscher, MD   ANESTHESIA:  General.   FINDINGS:  Indirect hernia with small sliding component with excision of  sac closure, and then repair of the floor with mesh.   DESCRIPTION OF PROCEDURE:  Mr. No is a 76 year old white male who  was brought to OR #3 at [facility] Day surgery on June 30, 2007 and given  general anesthesia.  The abdomen was clipped and the correct side had  been marked in the holding area with his assistance, and then this was  prepped with Techni-Care and draped sterilely.   A small oblique incision was made, having first made as a crosshatch to  help align the edges of the wound.  This was carried down to the  external oblique.  This was noted to be splayed and torn.  I went ahead  and opened down to the external ring.  I then mobilized the cord.  Proximally I went and identified the hernia sac, which was readily  apparent, and dissected it free from the cord structures.  I opened it,  and put my finger in so that I could get a complete excision; and then I  went up high, where I found medially, what appeared to be the sigmoid  colon.   At that point, I cut down on the sac, and I sutured it under direct  vision with a 2-0 Vicryl.  I then removed the excess sac, and sent that  as specimen.  This was then tucked into the abdomen.  I repaired the  ring with the medial suture, again, trying to get the muscle of the  internal transversalis fascia down to the inguinal ligament, and then  laterally, I incorporated the mesh that I placed to close the internal  ring somewhat laterally.   Meantime, I cut a piece of the ULTRAPRO mesh to fit and sutured it along  the inguinal ligament with running 2-0 Prolene, and medially running 2-0  Prolene tucked beneath the external oblique which was then closed over  this.  I identified the ilioinguinal nerve branches and spared those.  The area was injected with 1/2% lidocaine, and it was irrigated well.  No bleeding was noted.  The wound was then closed with 4-0 Vicryl  subcuticularly and with  Dermabond.  The patient seemed to tolerate the procedure well, and was  taken to recovery room.  He will be given Tylox for pain, and will be  sent home to the care of his wife.   FINAL DIAGNOSIS:  Left indirect  inguinal hernia.      Thornton Park Daphine Deutscher, MD  Electronically Signed     MBM/MEDQ  D:  06/30/2007  T:  06/30/2007  Job:  [id number]   cc:   Arta Silence, MD

## 2010-08-06 NOTE — Op Note (Signed)
NAME:  Adam Clements, Adam Clements NO.:  [id number]   MEDICAL RECORD NO.:  [id number]          PATIENT TYPE:  OIB   LOCATION:  2018                         FACILITY:  [facility]   PHYSICIAN:  Verne Carrow, MDDATE OF BIRTH:  1934/08/15   DATE OF PROCEDURE:  07/20/2008  DATE OF DISCHARGE:                               OPERATIVE REPORT   PRIMARY CARDIOLOGIST:  Hillis Range, MD   REFERRING PHYSICIAN:  Hillis Range, MD   PROCEDURE PERFORMED:  Distal aortogram with bilateral lower extremity  runoff.   OPERATOR:  Verne Carrow, MD   INDICATION:  This is a 76 year old Caucasian male with a history of  hypertension, gout, and known peripheral vascular disease who was  recently evaluated for worsening claudication in the right lower  extremity.  Noninvasive arterial Doppler studies performed on July 19, 2008, showed a moderate obstructive disease in the bilateral common  femoral arteries as well as a total occlusion of the right superficial  femoral artery in the mid to distal portion with reconstitution in the  right popliteal artery.  There was moderate disease noted in the left  superficial femoral artery.  The Doppler studies were suggestive of poor  runoff in the arteries below the knees.  The patient was referred for  diagnostic lower aortogram with bilateral lower extremity runoff.   DETAILS OF PROCEDURE:  The patient was brought into the Peripheral  Vascular Laboratory after signing informed consent for the procedure.  The left groin was prepped and draped in a sterile fashion.  A 5-French  sheath was inserted into the left femoral artery.  A 1% lidocaine was  used for local anesthesia.  A pigtail catheter was placed in the distal  aorta and angiography was performed.  The patient tolerated the  procedure well and was taken to the holding area in stable condition.   HEMODYNAMIC FINDINGS:  Central aortic pressure 143/72.   ANGIOGRAPHIC FINDINGS:  1. The  right renal artery was not selectively injected and showed no      significant stenosis.  2. The left renal artery was not selectively injected and showed no      significant stenosis.  3. The infrarenal aorta had diffuse plaque but no severe stenosis.  4. The right common iliac artery, right external iliac artery, right      internal iliac artery had plaque disease only.  There was no      obstructive disease in this system.  The right common femoral      artery had heavy calcification and at least to 50% stenosis prior      to the bifurcation.  5. The left common iliac artery, left external iliac artery, and left      internal iliac artery had nonobstructive plaque disease noted.  The      left common femoral artery had severe calcification with at least      50% stenosis.  This could be higher grade, but by angiography could      not be further defined.  6. The right superficial femoral artery had plaque in the proximal  portion and it was 100% occluded in the midportion.  There was      reconstitution of flow into the right popliteal artery via      collaterals.  The right tibioperoneal trunk was 100% occluded at      the margin.  The right anterior tibial artery was occluded at its      proximal portion.  The only runoff below the knee to the patient's      foot was via small collateral vessels.  7. The left superficial femoral artery had plaque in the proximal      portion, a 40% stenosis in the midportion, a 50% stenosis in the      distal portion.  The left popliteal artery had plaque disease.  The      left tibioperoneal trunk was 100% occluded at the margin.  The left      anterior tibial artery had a proximal 100% occlusion.  The only      runoff below the knee in the left leg was via small collaterals      extending to the foot.   IMPRESSION:  1. Severe bilateral lower extremity arterial disease.  2. Moderate severe obstructive disease in the bilateral common femoral       arteries with calcification.  3. A 100% occlusion of the mid superficial femoral artery with      reconstitution via collaterals in the right popliteal artery.  4. No significant arterial flow through major vessels to the bilateral      feet.  The bulk of the flow below the knees was via small      collateral networks.   RECOMMENDATIONS:  I did not think that it is likely that placing a stent  in the distal right superficial femoral artery will significantly  benefit this patient, as the runoff is poor and is only via small  collaterals.  I have reviewed the films here today with Dr. Durene Cal of Vascular Surgery and  a surgical option may be the best  approach in this patient.  Dr. Myra Gianotti has arranged to have Mr. Babington  come to his office for a followup on Monday, Jul 24, 2008.  We could  consider a possible right common femoral endarterectomy with the right  femoral-to-popliteal bypass.  The patient will be continued on full-  strength aspirin and will be started on Pletal 100 mg twice daily.  He  will be discharged later today if he is clinically stable.  He will be  instructed to return to the emergency room promptly if his foot becomes  cold or blue.      Verne Carrow, MD  Electronically Signed     CM/MEDQ  D:  07/20/2008  T:  07/21/2008  Job:  [id number]   cc:   Hillis Range, MD

## 2010-08-09 NOTE — Op Note (Signed)
NAME:  Adam Clements, Adam Clements               ACCOUNT NO.:  [id number]   MEDICAL RECORD NO.:  [id number]          PATIENT TYPE:  AMB   LOCATION:  ENDO                         [facility]AC[facility]   PHYSICIAN:  John C. Madilyn Fireman, M.D.    DATE OF BIRTH:  1934-11-25   DATE OF PROCEDURE:  11/08/2004  DATE OF DISCHARGE:                                 OPERATIVE REPORT   PROCEDURE:  Colonoscopy.   ENDOSCOPIST:  Everardo All. Madilyn Fireman, M.D.   INDICATIONS FOR PROCEDURE:  Average-risk colon cancer screening as well as  history of previous presentations compatible clinically with diverticulitis.   DESCRIPTION OF PROCEDURE:  The patient was placed in the left lateral  decubitus position and placed on the pulse monitor with continuous low-flow  oxygen delivered by nasal cannula. He was sedated with 75 mcg of IV fentanyl  and 7 mg of IV Versed. The Olympus video colonoscope was inserted into the  rectum and advanced to cecum, confirmed by transillumination of McBurney's  point and visualization of the ileocecal valve and appendiceal orifice. The  prep was excellent. The cecum appeared normal with no masses, polyps,  diverticula, or other mucosal abnormalities. Within the ascending colon,  there were seen several scattered diverticula that were also present in the  transverse and probably more concentrated in the descending and sigmoid  colon.  Otherwise, no masses or polyps were seen anywhere in the colon. The  rectum appeared normal on retroflexed view. The anus revealed no obvious  internal hemorrhoids. The scope was then withdrawn and the patient returned  to the recovery room in a stable condition. He tolerated the procedure well,  and there were no immediate complications.   IMPRESSION:  Left and right-sided diverticulosis.   PLAN:  Repeat colonoscopy within 10 years, consider flexible sigmoidoscopy  and/or Hemoccults in five years.           ______________________________  Everardo All Madilyn Fireman, M.D.     JCH/MEDQ   D:  11/08/2004  T:  11/08/2004  Job:  [id number]   cc:   Laurita Quint, M.D.  [address] [place]  [place] [address]  Fax: [phone]

## 2010-08-10 ENCOUNTER — Encounter: Payer: Self-pay | Admitting: Family Medicine

## 2010-08-10 ENCOUNTER — Encounter: Payer: Self-pay | Admitting: Internal Medicine

## 2010-08-12 ENCOUNTER — Telehealth: Payer: Self-pay | Admitting: Internal Medicine

## 2010-08-12 NOTE — Telephone Encounter (Signed)
Pt aware of results of echo and labs

## 2010-08-12 NOTE — Telephone Encounter (Signed)
Pt calling re echo test results. Pt would like to talk to a nurse.

## 2010-08-26 ENCOUNTER — Other Ambulatory Visit: Payer: Self-pay

## 2010-08-28 ENCOUNTER — Encounter: Payer: Self-pay | Admitting: Family Medicine

## 2010-10-31 ENCOUNTER — Other Ambulatory Visit: Payer: Self-pay | Admitting: Family Medicine

## 2010-10-31 DIAGNOSIS — R002 Palpitations: Secondary | ICD-10-CM

## 2010-10-31 DIAGNOSIS — N400 Enlarged prostate without lower urinary tract symptoms: Secondary | ICD-10-CM

## 2010-10-31 DIAGNOSIS — M109 Gout, unspecified: Secondary | ICD-10-CM

## 2010-10-31 DIAGNOSIS — I5020 Unspecified systolic (congestive) heart failure: Secondary | ICD-10-CM

## 2010-10-31 DIAGNOSIS — E749 Disorder of carbohydrate metabolism, unspecified: Secondary | ICD-10-CM

## 2010-10-31 DIAGNOSIS — K5730 Diverticulosis of large intestine without perforation or abscess without bleeding: Secondary | ICD-10-CM

## 2010-11-04 ENCOUNTER — Other Ambulatory Visit (INDEPENDENT_AMBULATORY_CARE_PROVIDER_SITE_OTHER): Payer: Medicare Other | Admitting: Family Medicine

## 2010-11-04 DIAGNOSIS — R002 Palpitations: Secondary | ICD-10-CM

## 2010-11-04 DIAGNOSIS — K5730 Diverticulosis of large intestine without perforation or abscess without bleeding: Secondary | ICD-10-CM

## 2010-11-04 DIAGNOSIS — M109 Gout, unspecified: Secondary | ICD-10-CM

## 2010-11-04 DIAGNOSIS — N400 Enlarged prostate without lower urinary tract symptoms: Secondary | ICD-10-CM

## 2010-11-04 DIAGNOSIS — E749 Disorder of carbohydrate metabolism, unspecified: Secondary | ICD-10-CM

## 2010-11-04 DIAGNOSIS — I5020 Unspecified systolic (congestive) heart failure: Secondary | ICD-10-CM

## 2010-11-04 LAB — RENAL FUNCTION PANEL
CO2: 26 mEq/L (ref 19–32)
Chloride: 104 mEq/L (ref 96–112)
GFR: 62.82 mL/min (ref >60.00)
Phosphorus: 2.9 mg/dL (ref 2.3–4.6)
Potassium: 4.4 mEq/L (ref 3.5–5.1)
Sodium: 137 mEq/L (ref 135–145)

## 2010-11-04 LAB — LIPID PANEL
HDL: 74.6 mg/dL (ref >39.00)
LDL Cholesterol: 88 mg/dL (ref 0–99)
Total CHOL/HDL Ratio: 2
Triglycerides: 29 mg/dL (ref 0.0–149.0)
VLDL: 5.8 mg/dL (ref 0.0–40.0)

## 2010-11-04 LAB — TSH: TSH: 1.34 u[IU]/mL (ref 0.35–5.50)

## 2010-11-04 LAB — HEPATIC FUNCTION PANEL
Alkaline Phosphatase: 49 U/L (ref 39–117)
Bilirubin, Direct: 0.1 mg/dL (ref 0.0–0.3)
Total Bilirubin: 0.9 mg/dL (ref 0.3–1.2)
Total Protein: 5.5 g/dL — ABNORMAL LOW (ref 6.0–8.3)

## 2010-11-04 LAB — CBC WITH DIFFERENTIAL/PLATELET
Basophils Absolute: 0 10*3/uL (ref 0.0–0.1)
Eosinophils Relative: 6 % — ABNORMAL HIGH (ref 0.0–5.0)
Lymphocytes Relative: 26.9 % (ref 12.0–46.0)
Monocytes Relative: 11.1 % (ref 3.0–12.0)
Platelets: 234 10*3/uL (ref 150.0–400.0)
RDW: 14 % (ref 11.5–14.6)
WBC: 3.5 10*3/uL — ABNORMAL LOW (ref 4.5–10.5)

## 2010-11-04 LAB — URIC ACID: Uric Acid, Serum: 5.1 mg/dL (ref 4.0–7.8)

## 2010-11-07 ENCOUNTER — Encounter: Payer: Self-pay | Admitting: Family Medicine

## 2010-11-07 ENCOUNTER — Ambulatory Visit (INDEPENDENT_AMBULATORY_CARE_PROVIDER_SITE_OTHER): Payer: Medicare Other | Admitting: Family Medicine

## 2010-11-07 DIAGNOSIS — N400 Enlarged prostate without lower urinary tract symptoms: Secondary | ICD-10-CM

## 2010-11-07 DIAGNOSIS — E749 Disorder of carbohydrate metabolism, unspecified: Secondary | ICD-10-CM

## 2010-11-07 DIAGNOSIS — Z0000 Encounter for general adult medical examination without abnormal findings: Secondary | ICD-10-CM

## 2010-11-07 DIAGNOSIS — K5730 Diverticulosis of large intestine without perforation or abscess without bleeding: Secondary | ICD-10-CM

## 2010-11-07 DIAGNOSIS — E8809 Other disorders of plasma-protein metabolism, not elsewhere classified: Secondary | ICD-10-CM

## 2010-11-07 DIAGNOSIS — J309 Allergic rhinitis, unspecified: Secondary | ICD-10-CM

## 2010-11-07 DIAGNOSIS — Z0100 Encounter for examination of eyes and vision without abnormal findings: Secondary | ICD-10-CM

## 2010-11-07 DIAGNOSIS — K4090 Unilateral inguinal hernia, without obstruction or gangrene, not specified as recurrent: Secondary | ICD-10-CM

## 2010-11-07 DIAGNOSIS — R002 Palpitations: Secondary | ICD-10-CM

## 2010-11-07 NOTE — Assessment & Plan Note (Signed)
Sxs stable as is PSA. Cont to observe.

## 2010-11-07 NOTE — Assessment & Plan Note (Signed)
Albumin slightly low but stqble. Long history of stable nos.

## 2010-11-07 NOTE — Progress Notes (Signed)
  Subjective:    Patient ID: Adam Clements, male    DOB: 1934-06-09, 76 y.o.   MRN: [id number]  HPI Pt here for Comp Exam. He was last seen by Dr Johney Frame of Cardiology 4/12 and was found to be stable, kept off Lisinopril because of dizziness and kept on Toprol.  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    Review of Systems  Constitutional: Negative for fever, chills, diaphoresis, appetite change, fatigue and unexpected weight change.  HENT: Negative for ear pain, tinnitus and ear discharge. Hearing loss: mild noticeable. Has hearing aids and wears them about 50% of the time.    Eyes: Negative for pain, discharge and visual disturbance.  Respiratory: Negative for cough, shortness of breath and wheezing.   Cardiovascular: Negative for chest pain and palpitations.       No SOB w/ exertion  Gastrointestinal: Negative for nausea, vomiting, abdominal pain, diarrhea, constipation and blood in stool.       No heartburn or swallowing problems.  Genitourinary: Negative for dysuria, frequency and difficulty urinating.       No nocturia  Musculoskeletal: Positive for arthralgias (physical work at [employer] makes tired.). Negative for myalgias and back pain.  Skin: Negative for rash.       No itching or dryness.  Neurological: Negative for tremors and numbness.       No tingling or balance problems.  Hematological: Negative for adenopathy. Does not bruise/bleed easily.  Psychiatric/Behavioral: Negative for dysphoric mood and agitation.       Objective:   Physical Exam  Constitutional: He is oriented to person, place, and time. He appears well-developed and well-nourished. No distress.  HENT:  Head: Normocephalic and atraumatic.  Right Ear: External ear normal.  Left Ear: External ear normal.  Nose: Nose normal.  Mouth/Throat: Oropharynx is clear and moist.  Eyes: Conjunctivae and EOM are normal. Pupils are equal, round, and reactive to light. Right eye exhibits no  discharge. Left eye exhibits no discharge. No scleral icterus.  Neck: Normal range of motion. Neck supple. No thyromegaly present.  Cardiovascular: Normal rate, regular rhythm, normal heart sounds and intact distal pulses.   No murmur heard. Pulmonary/Chest: Effort normal and breath sounds normal. No respiratory distress. He has no wheezes.  Abdominal: Soft. Bowel sounds are normal. He exhibits no distension and no mass. There is no tenderness. There is no rebound and no guarding.  Genitourinary: Penis normal.       No inguinal hernias felt. Rectal not done.  Musculoskeletal: Normal range of motion. He exhibits no edema.  Lymphadenopathy:    He has no cervical adenopathy.  Neurological: He is alert and oriented to person, place, and time. Coordination normal.  Skin: Skin is warm and dry. No rash noted. He is not diaphoretic.  Psychiatric: He has a normal mood and affect. His behavior is normal. Judgment and thought content normal.          Assessment & Plan:  HMPE   I have personally reviewed the Medicare Annual Wellness questionnaire and have noted 1. The patient's medical and social history 2. Their use of alcohol, tobacco or illicit drugs 3. Their current medications and supplements 4. The patient's functional ability including ADL's, fall risks, home safety risks and hearing or visual             impairment. 5. Diet and physical activities 6. Evidence for depression or mood disorders

## 2010-11-07 NOTE — Patient Instructions (Signed)
Call early for appt next year to be assigned to new doctor.

## 2010-11-07 NOTE — Assessment & Plan Note (Signed)
Repaired 4/09.

## 2010-11-07 NOTE — Assessment & Plan Note (Signed)
Adequately controlled per his symptoms and opinion.

## 2010-11-07 NOTE — Assessment & Plan Note (Signed)
Come in for prolonged LLQ discomfort. 

## 2010-11-07 NOTE — Assessment & Plan Note (Signed)
Well controlled. Cont curr meds. 

## 2010-11-07 NOTE — Assessment & Plan Note (Signed)
Stable at fasting glucose at 102. Discussed avoiding sweets and carbs.

## 2010-11-20 ENCOUNTER — Other Ambulatory Visit: Payer: Self-pay | Admitting: *Deleted

## 2010-11-20 MED ORDER — METOPROLOL SUCCINATE ER 25 MG PO TB24: ORAL_TABLET | ORAL | Status: DC

## 2010-12-17 LAB — POCT HEMOGLOBIN-HEMACUE: Hemoglobin: 13.1

## 2011-02-05 ENCOUNTER — Encounter: Payer: Self-pay | Admitting: Internal Medicine

## 2011-02-05 ENCOUNTER — Ambulatory Visit (INDEPENDENT_AMBULATORY_CARE_PROVIDER_SITE_OTHER): Payer: Medicare Other | Admitting: Internal Medicine

## 2011-02-05 VITALS — BP 104/52 | HR 73 | Ht 69.0 in | Wt 158.0 lb

## 2011-02-05 DIAGNOSIS — I999 Unspecified disorder of circulatory system: Secondary | ICD-10-CM

## 2011-02-05 DIAGNOSIS — I519 Heart disease, unspecified: Secondary | ICD-10-CM

## 2011-02-05 DIAGNOSIS — I739 Peripheral vascular disease, unspecified: Secondary | ICD-10-CM

## 2011-02-05 DIAGNOSIS — I4949 Other premature depolarization: Secondary | ICD-10-CM

## 2011-02-05 DIAGNOSIS — I498 Other specified cardiac arrhythmias: Secondary | ICD-10-CM

## 2011-02-05 NOTE — Assessment & Plan Note (Signed)
He has occasional fatigue which may be due to metoprolol.  As PVCs have resolved, I have advised that he stop toprol.  IF his palpitations return or his fatigue does not improve, then he should restart toprol. No further workup planned at this point.

## 2011-02-05 NOTE — Assessment & Plan Note (Signed)
Claudication is stable with pletal Regular exercise encouraged He will follow-up with Dr Myra Gianotti is worsened.  Given recent arm numbness in the suana, we will order carotid dopplers.  If sypmtoms return, he should contact my office or follow-up with PCP. We will also obtain an abdominal US to evaluate for AAA given h/o tobacco and vascular disease.

## 2011-02-05 NOTE — Assessment & Plan Note (Signed)
Stable No change required today  

## 2011-02-05 NOTE — Progress Notes (Signed)
The patient presents today for routine cardiology followup. He reports doing very well since last being seen in our clinic.   He exercises daily, without difficulty.He is reluctant to stop using the sauna despite my recommendation.  He recently thinks that he has occasional R hand numbness when in the shower.  This has not occurred over the past few weeks.  He feels his PVCs have pretty much resolved.   His claudication is stable with pletal.   The patient denies symptoms of chest pain, shortness of breath, orthopnea, PND, lower extremity edema, dizziness, presyncope, syncope, or neurologic sequela.  He reports occasional fatigue. The patient is tolerating medications without difficulties and is otherwise without complaint today.    Past Medical History  Diagnosis Date  . Allergic rhinitis   . Diverticulosis of colon     conoscopy 12/1993 (Dr. Madilyn Fireman) S/G divertics//colonoscopy L&R divertics (Dr. Madilyn Fireman) 11/08/2004  . PVC (premature ventricular contraction)   . Bradycardia   . Gout   . HTN (hypertension)   . PVD (peripheral vascular disease)     followed by Dr Myra Gianotti  . BPH (benign prostatic hypertrophy)   . Mitral regurgitation     Pulmonic regurgitation, with marked LA enlargement  . CAD (coronary artery disease)     nonobstructive  . Nonischemic cardiomyopathy     EF 40%   Past Surgical History  Procedure Date  . Appendectomy   . Dexa (secondary shrunk 2") wnl 07/06/03   . Abd/pelvic ct horseshoe kidney 10/10/04     Current Outpatient Prescriptions  Medication Sig Dispense Refill  . Ascorbic Acid (VITAMIN C) 500 MG tablet Take 500 mg by mouth daily.        Marland Kitchen aspirin 325 MG tablet Take 325 mg by mouth daily.        Marland Kitchen CALCIUM-VITAMIN D PO Take by mouth as directed.        . cilostazol (PLETAL) 100 MG tablet Take 1 tablet (100 mg total) by mouth 2 (two) times daily.  60 tablet  6  . metoprolol succinate (TOPROL-XL) 25 MG 24 hr tablet Take 1/2 by mouth two times a day  30 tablet  6    . psyllium (FIBER THERAPY) 0.52 G capsule Take two by mouth twice a day         No Known Allergies  History   Social History  . Marital Status: Married    Spouse Name: N/A    Number of Children: N/A  . Years of Education: N/A   Occupational History  . Not on file.   Social History Main Topics  . Smoking status: Former Smoker -- 2.0 packs/day    Types: Cigarettes    Quit date: 11/10/1982  . Smokeless tobacco: Former Neurosurgeon  . Alcohol Use: 0.5 oz/week    1 drink(s) per week     daily  . Drug Use: No  . Sexually Active: No   Other Topics Concern  . Not on file   Social History Narrative  . No narrative on file    Family History  Problem Relation Age of Onset  . Hypertension    . Heart disease Mother 56    Natural causes with CHF  . Hypertension Father   . Stroke Father     cerebral hemorrhage    ROS-  All systems are reviewed and are negative except as outlined in the HPI above    Physical Exam: Filed Vitals:   02/05/11 1530  BP: 104/52  Pulse: 73  Height: 5\' 9"  (1.753 m)  Weight: 158 lb (71.668 kg)    GEN- The patient is well appearing, alert and oriented x 3 today.   Head- normocephalic, atraumatic Eyes-  Sclera clear, conjunctiva pink Ears- hearing intact Oropharynx- clear Neck- supple, no JVP Lymph- no cervical lymphadenopathy Lungs- Clear to ausculation bilaterally, normal work of breathing Heart- Regular rate and rhythm, no murmurs, rubs or gallops, PMI not laterally displaced GI- soft, NT, ND, + BS Extremities- no clubbing, cyanosis, or edema, extremities warm and cool MS- no significant deformity or atrophy Skin- no rash or lesion Psych- euthymic mood, full affect Neuro- strength and sensation are intact  ekg today reveals sinus rhythm 73 bpm, RBBB  Assessment and Plan:

## 2011-02-05 NOTE — Assessment & Plan Note (Signed)
improved

## 2011-02-05 NOTE — Patient Instructions (Signed)
Your physician wants you to follow-up in: 6 months with Dr Jacquiline Doe will receive a reminder letter in the mail two months in advance. If you don't receive a letter, please call our office to schedule the follow-up appointment.  Your physician has requested that you have a carotid duplex. This test is an ultrasound of the carotid arteries in your neck. It looks at blood flow through these arteries that supply the brain with blood. Allow one hour for this exam. There are no restrictions or special instructions.--vascular disease  Your physician has requested that you have an abdominal aorta duplex. During this test, an ultrasound is used to evaluate the aorta. Allow 30 minutes for this exam. Do not eat after midnight the day before and avoid carbonated beverages--vascular disease

## 2011-03-03 ENCOUNTER — Other Ambulatory Visit: Payer: Self-pay | Admitting: Internal Medicine

## 2011-03-07 ENCOUNTER — Other Ambulatory Visit: Payer: Self-pay | Admitting: Cardiology

## 2011-03-07 DIAGNOSIS — I6529 Occlusion and stenosis of unspecified carotid artery: Secondary | ICD-10-CM

## 2011-03-10 ENCOUNTER — Encounter (INDEPENDENT_AMBULATORY_CARE_PROVIDER_SITE_OTHER): Payer: Medicare Other | Admitting: Cardiology

## 2011-03-10 ENCOUNTER — Other Ambulatory Visit: Payer: Self-pay | Admitting: Internal Medicine

## 2011-03-10 ENCOUNTER — Encounter (INDEPENDENT_AMBULATORY_CARE_PROVIDER_SITE_OTHER): Payer: Medicare Other | Admitting: *Deleted

## 2011-03-10 DIAGNOSIS — I70219 Atherosclerosis of native arteries of extremities with intermittent claudication, unspecified extremity: Secondary | ICD-10-CM

## 2011-03-10 DIAGNOSIS — I6529 Occlusion and stenosis of unspecified carotid artery: Secondary | ICD-10-CM

## 2011-03-10 DIAGNOSIS — R209 Unspecified disturbances of skin sensation: Secondary | ICD-10-CM

## 2011-03-10 DIAGNOSIS — I700 Atherosclerosis of aorta: Secondary | ICD-10-CM

## 2011-03-10 DIAGNOSIS — I999 Unspecified disorder of circulatory system: Secondary | ICD-10-CM

## 2011-03-10 MED ORDER — CILOSTAZOL 100 MG PO TABS: 100.0000 mg | ORAL_TABLET | Freq: Two times a day (BID) | ORAL | Status: DC

## 2011-03-12 ENCOUNTER — Ambulatory Visit (INDEPENDENT_AMBULATORY_CARE_PROVIDER_SITE_OTHER): Payer: Medicare Other | Admitting: Family Medicine

## 2011-03-12 ENCOUNTER — Encounter: Payer: Self-pay | Admitting: Family Medicine

## 2011-03-12 DIAGNOSIS — R42 Dizziness and giddiness: Secondary | ICD-10-CM

## 2011-03-12 NOTE — Assessment & Plan Note (Addendum)
Seems related to sitting for a while and then getting up. BP typically low but not pathological as this pt's BP is on low side most of the time. Suggest he try hydrating better for a month and see if this simple routine might help. If not, would then add a small amt of salt to his regimen to see if augmented vascular volume might help.  If neither of these measures changes his symptoms for the better, would then see Dr Johney Frame again and discuss possible holter. He has not found his pulse to be low. Indeed he finds i elevated slightly when this happens, which is reassuring.

## 2011-03-12 NOTE — Progress Notes (Signed)
  Subjective:    Patient ID: Adam Clements, male    DOB: 14-Apr-1934, 76 y.o.   MRN: [id number]  HPI Pt here as acute appt for dizziness. He was seen by Dr Johney Frame and discussed working two days a week with [employer] and working two days otherwise leaves him very tired most weeks. He has checked his pulse and noticed 2 abnormal beats out of 100 and without it he is about the same. So he stopped the Metoprolol.  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 He had carotids checked a few days thereafter and was normal. He then had the same thing after having lunch and then standing. He has noticed that BP is 90/50 and he thinks his pulse is ok typically when this happens.  He also has passed out after using the hot tub and Dr Johney Frame told him to stop the hot tub, which he has done. He has not passed out since. He is very active and works with [employer] regularly. He does not want an episode while working on a roof or up on a ladder.    Review of SystemsNoncontributory except as above.       Objective:   Physical Exam  Constitutional: He is oriented to person, place, and time. He appears well-developed and well-nourished. No distress.  HENT:  Head: Normocephalic and atraumatic.  Right Ear: External ear normal.  Left Ear: External ear normal.  Nose: Nose normal.  Mouth/Throat: Oropharynx is clear and moist.  Eyes: Conjunctivae and EOM are normal. Pupils are equal, round, and reactive to light. Right eye exhibits no discharge. Left eye exhibits no discharge.  Neck: Normal range of motion. Neck supple.  Cardiovascular: Normal rate and regular rhythm.   Pulmonary/Chest: Effort normal and breath sounds normal. He has no wheezes.  Lymphadenopathy:    He has no cervical adenopathy.  Neurological: He is alert and oriented to person, place, and time. He has normal reflexes. He displays normal reflexes. No  cranial nerve deficit. He exhibits normal muscle tone. Coordination normal.  Skin: Skin is warm and dry. He is not diaphoretic.  Psychiatric: He has a normal mood and affect. His behavior is normal. Judgment and thought content normal.          Assessment & Plan:

## 2011-03-12 NOTE — Patient Instructions (Signed)
Concentrate on fluid replacement for one month and associate sxs. If no improvement, add some salt daily for another month. If no improvement, see Dr Johney Frame. ?Possibly get holter?

## 2011-03-25 DIAGNOSIS — D649 Anemia, unspecified: Secondary | ICD-10-CM

## 2011-03-25 HISTORY — DX: Anemia, unspecified: D64.9

## 2011-07-14 DIAGNOSIS — H905 Unspecified sensorineural hearing loss: Secondary | ICD-10-CM | POA: Diagnosis not present

## 2011-07-14 DIAGNOSIS — H903 Sensorineural hearing loss, bilateral: Secondary | ICD-10-CM | POA: Diagnosis not present

## 2011-07-15 ENCOUNTER — Other Ambulatory Visit: Payer: Self-pay | Admitting: Otolaryngology

## 2011-07-15 DIAGNOSIS — H905 Unspecified sensorineural hearing loss: Secondary | ICD-10-CM

## 2011-07-18 ENCOUNTER — Ambulatory Visit (INDEPENDENT_AMBULATORY_CARE_PROVIDER_SITE_OTHER): Payer: Medicare Other | Admitting: Internal Medicine

## 2011-07-18 ENCOUNTER — Ambulatory Visit
Admission: RE | Admit: 2011-07-18 | Discharge: 2011-07-18 | Disposition: A | Discharge status: Home / Self Care | Payer: Medicare Other | Source: Ambulatory Visit | Attending: Otolaryngology | Admitting: Otolaryngology

## 2011-07-18 ENCOUNTER — Encounter: Payer: Self-pay | Admitting: Internal Medicine

## 2011-07-18 VITALS — BP 142/74 | HR 82 | Ht 69.0 in | Wt 160.8 lb

## 2011-07-18 DIAGNOSIS — H905 Unspecified sensorineural hearing loss: Secondary | ICD-10-CM | POA: Diagnosis not present

## 2011-07-18 DIAGNOSIS — G319 Degenerative disease of nervous system, unspecified: Secondary | ICD-10-CM | POA: Diagnosis not present

## 2011-07-18 DIAGNOSIS — I519 Heart disease, unspecified: Secondary | ICD-10-CM

## 2011-07-18 DIAGNOSIS — I739 Peripheral vascular disease, unspecified: Secondary | ICD-10-CM | POA: Diagnosis not present

## 2011-07-18 MED ORDER — GADOBENATE DIMEGLUMINE 529 MG/ML IV SOLN
15.0000 mL | Freq: Once | INTRAVENOUS | Status: AC | PRN
  Administered 2011-07-18: 15 mL via INTRAVENOUS

## 2011-07-18 NOTE — Patient Instructions (Signed)
Your physician wants you to follow-up in: 12 months with Dr Allred You will receive a reminder letter in the mail two months in advance. If you don't receive a letter, please call our office to schedule the follow-up appointment.  

## 2011-07-21 ENCOUNTER — Encounter: Payer: Self-pay | Admitting: Internal Medicine

## 2011-07-21 NOTE — Progress Notes (Signed)
The patient presents today for routine cardiology followup. He reports doing very well since last being seen in our clinic.   He feels his PVCs have pretty much resolved.   His claudication is stable with pletal.   The patient denies symptoms of chest pain, shortness of breath, orthopnea, PND, lower extremity edema, dizziness, presyncope, syncope, or neurologic sequela.  He reports occasional fatigue. The patient is tolerating medications without difficulties and is otherwise without complaint today.    Past Medical History  Diagnosis Date  . Allergic rhinitis   . Diverticulosis of colon     conoscopy 12/1993 (Dr. Madilyn Fireman) S/G divertics//colonoscopy L&R divertics (Dr. Madilyn Fireman) 11/08/2004  . PVC (premature ventricular contraction)   . Bradycardia   . Gout   . HTN (hypertension)   . PVD (peripheral vascular disease)     followed by Dr Myra Gianotti  . BPH (benign prostatic hypertrophy)   . Mitral regurgitation     Pulmonic regurgitation, with marked LA enlargement  . CAD (coronary artery disease)     nonobstructive  . Nonischemic cardiomyopathy     EF 40%   Past Surgical History  Procedure Date  . Appendectomy   . Dexa (secondary shrunk 2") wnl 07/06/03   . Abd/pelvic ct horseshoe kidney 10/10/04     Current Outpatient Prescriptions  Medication Sig Dispense Refill  . Ascorbic Acid (VITAMIN C) 500 MG tablet Take 500 mg by mouth daily.        Marland Kitchen aspirin 325 MG tablet Take 325 mg by mouth daily.        Marland Kitchen CALCIUM-VITAMIN D PO Take by mouth as directed.        . cilostazol (PLETAL) 100 MG tablet Take 1 tablet (100 mg total) by mouth 2 (two) times daily.  60 tablet  6  . psyllium (FIBER THERAPY) 0.52 G capsule Take two by mouth twice a day         No Known Allergies  History   Social History  . Marital Status: Married    Spouse Name: N/A    Number of Children: N/A  . Years of Education: N/A   Occupational History  . Not on file.   Social History Main Topics  . Smoking status: Former  Smoker -- 2.0 packs/day    Types: Cigarettes    Quit date: 11/10/1982  . Smokeless tobacco: Former Neurosurgeon  . Alcohol Use: 0.5 oz/week    1 drink(s) per week     daily  . Drug Use: No  . Sexually Active: No   Other Topics Concern  . Not on file   Social History Narrative  . No narrative on file    Family History  Problem Relation Age of Onset  . Hypertension    . Heart disease Mother 33    Natural causes with CHF  . Hypertension Father   . Stroke Father     cerebral hemorrhage     Physical Exam: Filed Vitals:   07/18/11 1541  BP: 142/74  Pulse: 82  Height: 5\' 9"  (1.753 m)  Weight: 160 lb 12.8 oz (72.938 kg)    GEN- The patient is well appearing, alert and oriented x 3 today.   Head- normocephalic, atraumatic Eyes-  Sclera clear, conjunctiva pink Ears- hearing intact Oropharynx- clear Neck- supple, no JVP Lymph- no cervical lymphadenopathy Lungs- Clear to ausculation bilaterally, normal work of breathing Heart- Regular rate and rhythm, no murmurs, rubs or gallops, PMI not laterally displaced GI- soft, NT, ND, + BS Extremities-  no clubbing, cyanosis, or edema, extremities warm and cool MS- no significant deformity or atrophy Skin- no rash or lesion Psych- euthymic mood, full affect Neuro- strength and sensation are intact  Assessment and Plan:

## 2011-07-21 NOTE — Assessment & Plan Note (Signed)
Stable No change required today  

## 2011-07-21 NOTE — Assessment & Plan Note (Signed)
Claudication is stable with pletal Regular exercise encouraged He will follow-up with Dr Myra Gianotti   Carotid dopplers and abdominal US reviewed with patient.  No changes today.

## 2011-07-29 ENCOUNTER — Other Ambulatory Visit: Payer: Self-pay

## 2011-07-29 DIAGNOSIS — D0420 Carcinoma in situ of skin of unspecified ear and external auricular canal: Secondary | ICD-10-CM | POA: Diagnosis not present

## 2011-07-29 DIAGNOSIS — C44319 Basal cell carcinoma of skin of other parts of face: Secondary | ICD-10-CM | POA: Diagnosis not present

## 2011-09-12 ENCOUNTER — Ambulatory Visit (INDEPENDENT_AMBULATORY_CARE_PROVIDER_SITE_OTHER): Payer: Medicare Other | Admitting: Family Medicine

## 2011-09-12 ENCOUNTER — Encounter: Payer: Self-pay | Admitting: Family Medicine

## 2011-09-12 VITALS — BP 140/87 | HR 78 | Temp 98.4°F | Ht 69.0 in | Wt 157.5 lb

## 2011-09-12 DIAGNOSIS — R634 Abnormal weight loss: Secondary | ICD-10-CM | POA: Diagnosis not present

## 2011-09-12 DIAGNOSIS — J309 Allergic rhinitis, unspecified: Secondary | ICD-10-CM

## 2011-09-12 LAB — COMPREHENSIVE METABOLIC PANEL
ALT: 17 U/L (ref 0–53)
AST: 22 U/L (ref 0–37)
Albumin: 3.4 g/dL — ABNORMAL LOW (ref 3.5–5.2)
CO2: 27 mEq/L (ref 19–32)
Calcium: 8.9 mg/dL (ref 8.4–10.5)
Chloride: 104 mEq/L (ref 96–112)
GFR: 59.79 mL/min — ABNORMAL LOW (ref >60.00)
Potassium: 4.1 mEq/L (ref 3.5–5.1)
Sodium: 138 mEq/L (ref 135–145)
Total Protein: 5.5 g/dL — ABNORMAL LOW (ref 6.0–8.3)

## 2011-09-12 LAB — CBC WITH DIFFERENTIAL/PLATELET
Basophils Absolute: 0 10*3/uL (ref 0.0–0.1)
Eosinophils Absolute: 0.1 10*3/uL (ref 0.0–0.7)
Lymphocytes Relative: 24 % (ref 12.0–46.0)
MCHC: 33.3 g/dL (ref 30.0–36.0)
Monocytes Relative: 11 % (ref 3.0–12.0)
Neutro Abs: 2.3 10*3/uL (ref 1.4–7.7)
Platelets: 224 10*3/uL (ref 150.0–400.0)
RDW: 15.5 % — ABNORMAL HIGH (ref 11.5–14.6)

## 2011-09-12 LAB — TSH: TSH: 1.24 u[IU]/mL (ref 0.35–5.50)

## 2011-09-12 MED ORDER — LORATADINE 10 MG PO TABS: 10.0000 mg | ORAL_TABLET | Freq: Every day | ORAL | Status: DC

## 2011-09-12 MED ORDER — CILOSTAZOL 100 MG PO TABS: 100.0000 mg | ORAL_TABLET | Freq: Two times a day (BID) | ORAL | Status: DC

## 2011-09-12 NOTE — Progress Notes (Signed)
  Subjective:    Patient ID: Adam Clements, male    DOB: 1934-05-10, 77 y.o.   MRN: [id number]  HPI CC: transfer of care from Dr. Hetty Ely, weight loss  Continues to work 2d/wk with [employer].  Pleasant 77 yo presents with concern for weight loss.  Thinks has been losing weight over last few years.  Goes to gym regularly, within past month, weights have been as low as 150lbs.  Stays active.  Also this month hemoglobin has been low - unable to give blood because of this.  Hgb was 11.8 and then 11.2.  States eats very well.  Recently had low albumin.  Denies fevers/chill, appetite changes, night sweats, abd pain, n/v, chest pain/tightness, SOB, blood in stool or urine, coughing.  Quit smoking 1984, prior 2 ppd.  Wt Readings from Last 3 Encounters:  09/12/11 157 lb 8 oz (71.442 kg)  07/18/11 160 lb 12.8 oz (72.938 kg)  03/12/11 162 lb 8 oz (73.71 kg)  Body mass index is 23.26 kg/(m^2).   Colonoscopy - 2006 by Dr. Madilyn Fireman - R and L sided diverticulosis, rec rpt in 10 yrs.  H/o PVCs, stable off toprol.  Actually off all bp meds for last several months and feels overall well.  bp elevated for him today - however h/o low bps and dizziness.  Claudication sxs ok with pletal.  Saw Dr. Johney Frame 06/2011 - overall stable  Past Medical History  Diagnosis Date  . Allergic rhinitis   . Diverticulosis of colon     conoscopy 12/1993 (Dr. Madilyn Fireman) S/G divertics//colonoscopy L&R divertics (Dr. Madilyn Fireman) 11/08/2004  . PVC (premature ventricular contraction)   . Bradycardia   . Gout   . HTN (hypertension)   . PVD (peripheral vascular disease)     followed by Dr Myra Gianotti  . BPH (benign prostatic hypertrophy)   . Mitral regurgitation     Pulmonic regurgitation, with marked LA enlargement  . CAD (coronary artery disease)     nonobstructive  . Nonischemic cardiomyopathy     EF 40%   Last CPE 10/2010.  Denies falls in last year.  Denies depression/anhedonia.  Review of Systems Per HPI     Objective:   Physical Exam  Nursing note and vitals reviewed. Constitutional: He appears well-developed and well-nourished. No distress.  HENT:  Head: Normocephalic and atraumatic.  Right Ear: Hearing, tympanic membrane, external ear and ear canal normal.  Left Ear: Hearing, tympanic membrane, external ear and ear canal normal.  Mouth/Throat: Oropharynx is clear and moist. No oropharyngeal exudate.  Eyes: Conjunctivae and EOM are normal. Pupils are equal, round, and reactive to light. No scleral icterus.  Neck: Normal range of motion. Neck supple.  Cardiovascular: Normal rate, regular rhythm, normal heart sounds and intact distal pulses.   No murmur heard. Pulmonary/Chest: Effort normal and breath sounds normal. No respiratory distress. He has no wheezes. He has no rales.  Abdominal: Soft. Bowel sounds are normal. He exhibits no distension and no mass. There is no tenderness. There is no rebound and no guarding.  Musculoskeletal: He exhibits no edema.  Lymphadenopathy:    He has no cervical adenopathy.  Skin: Skin is warm and dry. No rash noted.  Psychiatric: He has a normal mood and affect.       Assessment & Plan:

## 2011-09-12 NOTE — Assessment & Plan Note (Signed)
rec try some nasal saline and claritin.

## 2011-09-12 NOTE — Assessment & Plan Note (Signed)
Actually reviewing last few years, seems pretty stable.  Reassured. Monitor for now. Will check CMP, CBC, TSH for reversible causes of possible weight loss esp given endorsing anemia when tried to donate blood last. Colonoscopy with diverticulosis in 2006 Madilyn Fireman).  If anemia, further eval with iFOB, iron studies, possible referral sooner to GI.

## 2011-09-12 NOTE — Patient Instructions (Addendum)
Keep an eye on blood pressure, if staying consistently >140/90, let me know. Weight seems to be doing allright looking over last 3 years. We will check blood work today.  If abnormal, we will further investigate. Try claritin for drainage. Good to see you today, call us with questions.

## 2011-09-14 ENCOUNTER — Other Ambulatory Visit: Payer: Self-pay | Admitting: Family Medicine

## 2011-09-14 DIAGNOSIS — D72819 Decreased white blood cell count, unspecified: Secondary | ICD-10-CM

## 2011-09-14 DIAGNOSIS — D649 Anemia, unspecified: Secondary | ICD-10-CM

## 2011-09-15 ENCOUNTER — Other Ambulatory Visit (INDEPENDENT_AMBULATORY_CARE_PROVIDER_SITE_OTHER): Payer: Medicare Other

## 2011-09-15 DIAGNOSIS — D649 Anemia, unspecified: Secondary | ICD-10-CM | POA: Diagnosis not present

## 2011-09-15 DIAGNOSIS — D72819 Decreased white blood cell count, unspecified: Secondary | ICD-10-CM

## 2011-09-15 LAB — IBC PANEL
Iron: 152 ug/dL (ref 42–165)
Transferrin: 236.3 mg/dL (ref 212.0–360.0)

## 2011-09-15 LAB — VITAMIN B12: Vitamin B-12: 384 pg/mL (ref 211–911)

## 2011-09-16 LAB — PATHOLOGIST SMEAR REVIEW

## 2011-09-18 ENCOUNTER — Other Ambulatory Visit: Payer: Self-pay | Admitting: Family Medicine

## 2011-09-18 DIAGNOSIS — D509 Iron deficiency anemia, unspecified: Secondary | ICD-10-CM

## 2011-09-18 DIAGNOSIS — D649 Anemia, unspecified: Secondary | ICD-10-CM | POA: Insufficient documentation

## 2011-09-26 ENCOUNTER — Telehealth: Payer: Self-pay

## 2011-09-26 NOTE — Telephone Encounter (Signed)
Pt left v/m requesting stool sample results from 1 week ago.Please advise.

## 2011-09-26 NOTE — Telephone Encounter (Signed)
No results received yet. Spoke with patient and advised that Dr. Reece Agar was out of the office this week and that I would call him as soon as I received the results. He verbalized understanding.

## 2011-10-03 NOTE — Telephone Encounter (Signed)
Pt left v/m requesting stool test results. Terri will ck on and call pt.

## 2011-10-03 NOTE — Telephone Encounter (Signed)
Spoke to the [employer], they have not received ifob on this patient. I called and informed the patient, I told him the samples get held up in the mail, but I would give him another ifob kit. He will wait another week, if no results he will pick up another kit

## 2011-10-13 NOTE — Telephone Encounter (Signed)
Pt called for results of IFOB, non available and pt will come by for IFOB kit.

## 2011-10-17 ENCOUNTER — Other Ambulatory Visit: Payer: Medicare Other

## 2011-10-17 DIAGNOSIS — D509 Iron deficiency anemia, unspecified: Secondary | ICD-10-CM

## 2011-10-17 LAB — FECAL OCCULT BLOOD, IMMUNOCHEMICAL: Fecal Occult Bld: NEGATIVE

## 2011-10-21 ENCOUNTER — Other Ambulatory Visit: Payer: Medicare Other

## 2011-10-30 ENCOUNTER — Other Ambulatory Visit: Payer: Self-pay | Admitting: Family Medicine

## 2011-10-30 DIAGNOSIS — I10 Essential (primary) hypertension: Secondary | ICD-10-CM | POA: Insufficient documentation

## 2011-10-30 DIAGNOSIS — I739 Peripheral vascular disease, unspecified: Secondary | ICD-10-CM

## 2011-10-30 DIAGNOSIS — N400 Enlarged prostate without lower urinary tract symptoms: Secondary | ICD-10-CM

## 2011-11-05 ENCOUNTER — Other Ambulatory Visit (INDEPENDENT_AMBULATORY_CARE_PROVIDER_SITE_OTHER): Payer: Medicare Other

## 2011-11-05 DIAGNOSIS — N400 Enlarged prostate without lower urinary tract symptoms: Secondary | ICD-10-CM | POA: Diagnosis not present

## 2011-11-05 DIAGNOSIS — I10 Essential (primary) hypertension: Secondary | ICD-10-CM | POA: Diagnosis not present

## 2011-11-05 DIAGNOSIS — I739 Peripheral vascular disease, unspecified: Secondary | ICD-10-CM

## 2011-11-05 LAB — LIPID PANEL
Cholesterol: 171 mg/dL (ref 0–200)
HDL: 78.5 mg/dL (ref >39.00)
LDL Cholesterol: 86 mg/dL (ref 0–99)
Total CHOL/HDL Ratio: 2
Triglycerides: 34 mg/dL (ref 0.0–149.0)

## 2011-11-10 ENCOUNTER — Ambulatory Visit (INDEPENDENT_AMBULATORY_CARE_PROVIDER_SITE_OTHER): Payer: Medicare Other | Admitting: Family Medicine

## 2011-11-10 ENCOUNTER — Encounter: Payer: Self-pay | Admitting: Family Medicine

## 2011-11-10 VITALS — BP 140/82 | HR 82 | Temp 97.6°F | Ht 69.0 in | Wt 156.2 lb

## 2011-11-10 DIAGNOSIS — R634 Abnormal weight loss: Secondary | ICD-10-CM | POA: Diagnosis not present

## 2011-11-10 DIAGNOSIS — Z1211 Encounter for screening for malignant neoplasm of colon: Secondary | ICD-10-CM | POA: Diagnosis not present

## 2011-11-10 DIAGNOSIS — Z0000 Encounter for general adult medical examination without abnormal findings: Secondary | ICD-10-CM | POA: Diagnosis not present

## 2011-11-10 DIAGNOSIS — D509 Iron deficiency anemia, unspecified: Secondary | ICD-10-CM | POA: Diagnosis not present

## 2011-11-10 MED ORDER — FERROUS SULFATE CR 160 (50 FE) MG PO TBCR: 160.0000 mg | EXTENDED_RELEASE_TABLET | Freq: Every day | ORAL | Status: DC

## 2011-11-10 NOTE — Assessment & Plan Note (Addendum)
With Hgb 11.5 last check, ferritin 9.  Other iron panel WNL. Unsure where blood loss from. On aspirin 325mg  and pletal for h/o PVD. Refer to GI for further eval Previous iron caused GI upset.  Start with slow FE

## 2011-11-10 NOTE — Assessment & Plan Note (Signed)
I have personally reviewed the Medicare Annual Wellness questionnaire and have noted 1. The patient's medical and social history 2. Their use of alcohol, tobacco or illicit drugs 3. Their current medications and supplements 4. The patient's functional ability including ADL's, fall risks, home safety risks and hearing or visual impairment. 5. Diet and physical activity 6. Evidence for depression or mood disorders The patients weight, height, BMI have been recorded in the chart.  Hearing and vision has been addressed. I have made referrals, counseling and provided education to the patient based review of the above and I have provided the pt with a written personalized care plan for preventive services. See scanned questionairre. Advanced directives discussed: has living will at home.  would want wife to be HCPOA then children  Reviewed preventative protocols and updated unless pt declined. PSA/DRE reassuring, consider d/c (pt wanted to continue this year). UTD colonoscopy and immunizations.

## 2011-11-10 NOTE — Progress Notes (Signed)
Subjective:    Patient ID: Adam Clements, male    DOB: 28-May-1934, 77 y.o.   MRN: [id number]  HPI CC: medicare wellness visit  Denies falls in last year. Denies anhedonia, depression, sadness.  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  Enjoys reading and working with horses. Goes to gym 3x/wk.  Body mass index is 23.07 kg/(m^2).  Wt Readings from Last 3 Encounters:  11/10/11 156 lb 4 oz (70.875 kg)  09/12/11 157 lb 8 oz (71.442 kg)  07/18/11 160 lb 12.8 oz (72.938 kg)  worried about weight continuing to drop, albeit slightly.  Down 1 lb in last 2 months.  Per pt down 10-20 lbs total in last several years, per chart max down 10lbs.  Found IDA - denies bleeding/bruising.  Colonoscopy 2006.  Several years ago on iron supplement, caused stomach upset and decreased appetite.  Diet - good meat, vegetable intake.  No h/o GERD, no stomach upset.  H/o claudication - on compression stocking which have helped, also on aspiirn 325 and cilostazol.  Preventative: Colonoscopy - 2006 by Dr. Madilyn Fireman - R and L sided diverticulosis, rec rpt in 10 yrs. Prostate screening - normal PSA last year, normal this year.  H/o BPH.  May age out.  Pt would like to continue for next few years.  Nocturia x1.  Strong stream. Pneumovax 2004 Td 2007, again 2012 in [place] after dog bite. zostavax 2009. Flu shot - did receive last year. Advanced directives: has living will at home -done through attorney.  HCPOA is wife then son and then oldest daughter etc.  Activity: goes to gym, works for [employer] for [employer] Diet: healthy, leafy greens, good meat intake weekly, good water  Medications and allergies reviewed and updated in chart.  Past histories reviewed and updated if relevant as below. Patient Active Problem List  Diagnosis  . CARBOHYDRATE METABOLISM DISORDER  . OTHER DISORDERS OF PLASMA PROTEIN METABOLISM  . GOUT  . MITRAL REGURGITATION  . PREMATURE VENTRICULAR CONTRACTIONS, FREQUENT  . BRADYCARDIA  .  UNSPECIFIED SYSTOLIC HEART FAILURE  . PERIPHERAL VASCULAR DISEASE  . ALLERGIC RHINITIS  . DIVERTICULOSIS, COLON  . HYPRTRPHY PROSTATE BNG W/O URINARY OBST/LUTS  . ROSACEA  . BICEPS TENDON RUPTURE, LEFT  . PALPITATIONS  . Chronic systolic dysfunction of left ventricle  . Postural dizziness  . Weight loss  . IDA (iron deficiency anemia)  . HTN (hypertension)   Past Medical History  Diagnosis Date  . Allergic rhinitis   . Diverticulosis of colon     conoscopy 12/1993 (Dr. Madilyn Fireman) S/G divertics//colonoscopy L&R divertics (Dr. Madilyn Fireman) 11/08/2004  . PVC (premature ventricular contraction)   . Bradycardia   . Gout   . HTN (hypertension)   . PVD (peripheral vascular disease)     followed by Dr Myra Gianotti  . BPH (benign prostatic hypertrophy)   . Mitral regurgitation     Pulmonic regurgitation, with marked LA enlargement  . CAD (coronary artery disease)     nonobstructive  . Nonischemic cardiomyopathy     EF 40%   Past Surgical History  Procedure Date  . Appendectomy   . Dexa (secondary shrunk 2") wnl 07/06/03   . Abd/pelvic ct horseshoe kidney 10/10/04    History  Substance Use Topics  . Smoking status: Former Smoker -- 2.0 packs/day    Types: Cigarettes    Quit date: 11/10/1982  . Smokeless tobacco: Former Neurosurgeon  . Alcohol Use: 0.5 oz/week    1 drink(s) per week  daily   Family History  Problem Relation Age of Onset  . Hypertension    . Heart disease Mother 10    Natural causes with CHF  . Hypertension Father   . Stroke Father     cerebral hemorrhage   No Known Allergies Current Outpatient Prescriptions on File Prior to Visit  Medication Sig Dispense Refill  . aspirin 325 MG tablet Take 325 mg by mouth daily.        Marland Kitchen CALCIUM-VITAMIN D PO Take by mouth as directed.        . cilostazol (PLETAL) 100 MG tablet Take 1 tablet (100 mg total) by mouth 2 (two) times daily.  60 tablet  6  . flintstones complete (FLINTSTONES) 60 MG chewable tablet Chew 1 tablet by mouth  daily.      . psyllium (FIBER THERAPY) 0.52 G capsule Take two by mouth twice a day         Review of Systems  Constitutional: Positive for unexpected weight change (slightly decreased). Negative for fever, chills, activity change, appetite change and fatigue.  HENT: Negative for hearing loss and neck pain.   Eyes: Negative for visual disturbance.  Respiratory: Negative for cough, chest tightness, shortness of breath and wheezing.   Cardiovascular: Negative for chest pain, palpitations and leg swelling.  Gastrointestinal: Negative for nausea, vomiting, abdominal pain, diarrhea, constipation, blood in stool and abdominal distention.  Genitourinary: Negative for hematuria and difficulty urinating.  Musculoskeletal: Negative for myalgias and arthralgias.  Skin: Negative for rash.  Neurological: Negative for dizziness, seizures, syncope and headaches.  Hematological: Does not bruise/bleed easily.  Psychiatric/Behavioral: Negative for dysphoric mood. The patient is not nervous/anxious.        Objective:   Physical Exam  Nursing note and vitals reviewed. Constitutional: He is oriented to person, place, and time. He appears well-developed and well-nourished. No distress.  HENT:  Head: Normocephalic and atraumatic.  Right Ear: Hearing, tympanic membrane, external ear and ear canal normal.  Left Ear: Hearing, tympanic membrane, external ear and ear canal normal.  Nose: Nose normal.  Mouth/Throat: Oropharynx is clear and moist. No oropharyngeal exudate.       Hearing aide  Eyes: Conjunctivae and EOM are normal. Pupils are equal, round, and reactive to light. No scleral icterus.  Neck: Normal range of motion. Neck supple. Carotid bruit is not present.  Cardiovascular: Normal rate, regular rhythm, normal heart sounds and intact distal pulses.   No murmur heard. Pulses:      Radial pulses are 2+ on the right side, and 2+ on the left side.  Pulmonary/Chest: Effort normal and breath sounds  normal. No respiratory distress. He has no wheezes. He has no rales.  Abdominal: Soft. Bowel sounds are normal. He exhibits no distension and no mass. There is no tenderness. There is no rigidity, no rebound, no guarding and negative Murphy's sign.       No HSM  Genitourinary: Rectum normal and prostate normal. Rectal exam shows no external hemorrhoid, no internal hemorrhoid, no fissure, no mass, no tenderness and anal tone normal. Guaiac negative stool. Prostate is not enlarged (minimally, 20gm) and not tender.  Musculoskeletal: Normal range of motion. He exhibits no edema.  Lymphadenopathy:    He has no cervical adenopathy.  Neurological: He is alert and oriented to person, place, and time.       CN grossly intact, station and gait intact  Skin: Skin is warm and dry. No rash noted.  Psychiatric: He has a normal mood  and affect. His behavior is normal. Judgment and thought content normal.       Assessment & Plan:

## 2011-11-10 NOTE — Assessment & Plan Note (Signed)
Mild on clinic records but pt endorses 10-20 lb weight loss over last several years.  Denies appetite changes or constitutional sxs. Given associated IDA (albeit longstanding), will refer to GI for recs.  Pt agrees with plan, worried about weight.

## 2011-11-10 NOTE — Patient Instructions (Addendum)
Pass by Marion's office for GI referral. Return as needed or in 1 year for next physical. Start slow release iron at 1 pill daily, try to take with orange juice between meals if stomach tolerates. Return sooner if weight continues to drop. Good to see you today, call us with quesitons.

## 2011-11-11 ENCOUNTER — Telehealth: Payer: Self-pay

## 2011-11-11 DIAGNOSIS — D509 Iron deficiency anemia, unspecified: Secondary | ICD-10-CM

## 2011-11-11 MED ORDER — FERROUS SULFATE ER 142 (45 FE) MG PO TBCR: 1.0000 | EXTENDED_RELEASE_TABLET | Freq: Every day | ORAL | Status: DC

## 2011-11-11 NOTE — Telephone Encounter (Signed)
plz notify I've sent in new formulation, but I recommend pt check with pharmacy to see if available prior to taking trip.

## 2011-11-11 NOTE — Telephone Encounter (Signed)
Pt said [facility] on [address]  iron med sent in 11/10/11 is no longer available and request substitution sent to [facility].Please advise.

## 2011-11-12 NOTE — Telephone Encounter (Signed)
Left message asking pt to call.

## 2011-11-13 NOTE — Telephone Encounter (Signed)
Pt left v/m he now has slow release iron; all is good.

## 2011-11-13 NOTE — Telephone Encounter (Signed)
Message left for patient to return my call.  

## 2011-11-30 DIAGNOSIS — H2510 Age-related nuclear cataract, unspecified eye: Secondary | ICD-10-CM | POA: Diagnosis not present

## 2011-12-05 DIAGNOSIS — D649 Anemia, unspecified: Secondary | ICD-10-CM | POA: Diagnosis not present

## 2011-12-05 DIAGNOSIS — R634 Abnormal weight loss: Secondary | ICD-10-CM | POA: Diagnosis not present

## 2011-12-12 ENCOUNTER — Encounter: Payer: Self-pay | Admitting: Family Medicine

## 2011-12-26 DIAGNOSIS — Z23 Encounter for immunization: Secondary | ICD-10-CM | POA: Diagnosis not present

## 2012-04-02 ENCOUNTER — Encounter (INDEPENDENT_AMBULATORY_CARE_PROVIDER_SITE_OTHER): Payer: Medicare Other

## 2012-04-02 DIAGNOSIS — I6529 Occlusion and stenosis of unspecified carotid artery: Secondary | ICD-10-CM

## 2012-04-13 ENCOUNTER — Telehealth: Payer: Self-pay | Admitting: Internal Medicine

## 2012-04-13 NOTE — Telephone Encounter (Signed)
Pt requesting carotid test results

## 2012-04-13 NOTE — Telephone Encounter (Signed)
Pt aware of results 

## 2012-04-27 ENCOUNTER — Other Ambulatory Visit: Payer: Self-pay | Admitting: Family Medicine

## 2012-06-28 ENCOUNTER — Ambulatory Visit (INDEPENDENT_AMBULATORY_CARE_PROVIDER_SITE_OTHER): Payer: Medicare Other | Admitting: Internal Medicine

## 2012-06-28 ENCOUNTER — Other Ambulatory Visit: Payer: Self-pay

## 2012-06-28 ENCOUNTER — Encounter: Payer: Self-pay | Admitting: Internal Medicine

## 2012-06-28 ENCOUNTER — Encounter: Payer: Self-pay | Admitting: Surgery

## 2012-06-28 VITALS — BP 103/63 | HR 82 | Ht 69.0 in | Wt 159.6 lb

## 2012-06-28 DIAGNOSIS — I739 Peripheral vascular disease, unspecified: Secondary | ICD-10-CM | POA: Diagnosis not present

## 2012-06-28 DIAGNOSIS — I4949 Other premature depolarization: Secondary | ICD-10-CM | POA: Diagnosis not present

## 2012-06-28 DIAGNOSIS — I10 Essential (primary) hypertension: Secondary | ICD-10-CM | POA: Diagnosis not present

## 2012-06-28 DIAGNOSIS — I493 Ventricular premature depolarization: Secondary | ICD-10-CM

## 2012-06-28 NOTE — Assessment & Plan Note (Signed)
Well controlled 

## 2012-06-28 NOTE — Patient Instructions (Addendum)
Your physician wants you to follow-up in: 12 months with Dr Jacquiline Doe will receive a reminder letter in the mail two months in advance. If you don't receive a letter, please call our office to schedule the follow-up appointment.   Your physician recommends that you continue on your current medications as directed. Please refer to the Current Medication list given to you today.   You have been referred back to Dr Myra Gianotti [phone]  08/09/12 at 1:30

## 2012-06-28 NOTE — Assessment & Plan Note (Signed)
Stable No change required today  

## 2012-06-28 NOTE — Progress Notes (Signed)
PCP: Eustaquio Boyden, MD  The patient presents today for routine cardiology followup. He reports doing very well since last being seen in our clinic.  His claudication is stable with pletal.   The patient denies symptoms of palpitations, chest pain, shortness of breath, orthopnea, PND, lower extremity edema, dizziness, presyncope, syncope, or neurologic sequela.  He reports occasional fatigue. The patient is tolerating medications without difficulties and is otherwise without complaint today.    Past Medical History  Diagnosis Date  . Allergic rhinitis   . Diverticulosis of colon     conoscopy 12/1993 (Dr. Madilyn Fireman) S/G divertics//colonoscopy L&R divertics (Dr. Madilyn Fireman) 11/08/2004  . PVC (premature ventricular contraction)   . Bradycardia   . Gout   . HTN (hypertension)   . PVD (peripheral vascular disease)     followed by Dr Myra Gianotti  . BPH (benign prostatic hypertrophy)   . Mitral regurgitation     Pulmonic regurgitation, with marked LA enlargement  . CAD (coronary artery disease)     nonobstructive  . Nonischemic cardiomyopathy     EF 40%   Past Surgical History  Procedure Laterality Date  . Appendectomy    . Dexa (secondary shrunk 2") wnl 07/06/03    . 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 Ascorbic Acid (VITAMIN C PO) Take 1 tablet by mouth daily.      Marland Kitchen aspirin 325 MG tablet Take 325 mg by mouth daily.        Marland Kitchen CALCIUM-VITAMIN D PO Take by mouth as directed.        . cilostazol (PLETAL) 100 MG tablet take 1 tablet by mouth twice a day  60 tablet  6  . psyllium (FIBER THERAPY) 0.52 G capsule Take two by mouth twice a day        No current facility-administered medications for this visit.    No Known Allergies  History   Social History  . Marital Status: Married    Spouse Name: N/A    Number of Children: N/A  . Years of Education: N/A   Occupational History  . Not on file.   Social History  Main Topics  . Smoking status: Former Smoker -- 2.00 packs/day    Types: Cigarettes    Quit date: 11/10/1982  . Smokeless tobacco: Former Neurosurgeon  . Alcohol Use: 0.5 oz/week    1 drink(s) per week     Comment: daily  . Drug Use: No  . Sexually Active: No   Other Topics Concern  . Not on file   Social History Narrative   Activity: goes to gym, works for [employer]   Diet: healthy, leafy greens, good meat intake weekly, good water    Family History  Problem Relation Age of Onset  . Hypertension    . Heart disease Mother 6    Natural causes with CHF  . Hypertension Father   . Stroke Father     cerebral hemorrhage     Physical Exam: Filed Vitals:   06/28/12 0920  BP: 103/63  Pulse: 82  Height: 5\' 9"  (1.753 m)  Weight: 159 lb 9.6 oz (72.394 kg)    GEN- The patient is well appearing, alert and oriented x 3 today.   Head- normocephalic, atraumatic Eyes-  Sclera clear, conjunctiva pink Ears- hearing intact Oropharynx- clear Neck- supple, no JVP Lymph- no cervical lymphadenopathy Lungs- Clear to ausculation bilaterally, normal work of breathing Heart- Regular rate  and rhythm, no murmurs, rubs or gallops, PMI not laterally displaced GI- soft, NT, ND, + BS Extremities- no clubbing, cyanosis, or edema, extremities warm and cool  ekg today reveals sinus rhythm 77 bpm, PR 174, QRS 148, RBBB, rare PVCs  Assessment and Plan:

## 2012-06-28 NOTE — Assessment & Plan Note (Signed)
Stable No change required today  He has not seen Dr Myra Gianotti in several years.  We will have him follow-up.

## 2012-08-06 ENCOUNTER — Encounter: Payer: Self-pay | Admitting: Surgery

## 2012-08-09 ENCOUNTER — Encounter: Payer: Medicare Other | Admitting: Surgery

## 2012-08-09 ENCOUNTER — Encounter: Payer: Self-pay | Admitting: Surgery

## 2012-08-09 ENCOUNTER — Encounter (INDEPENDENT_AMBULATORY_CARE_PROVIDER_SITE_OTHER): Payer: Medicare Other | Admitting: *Deleted

## 2012-08-09 ENCOUNTER — Ambulatory Visit (INDEPENDENT_AMBULATORY_CARE_PROVIDER_SITE_OTHER): Payer: Medicare Other | Admitting: Surgery

## 2012-08-09 VITALS — BP 132/81 | HR 78 | Ht 69.0 in | Wt 159.4 lb

## 2012-08-09 DIAGNOSIS — I739 Peripheral vascular disease, unspecified: Secondary | ICD-10-CM | POA: Insufficient documentation

## 2012-08-09 DIAGNOSIS — I70219 Atherosclerosis of native arteries of extremities with intermittent claudication, unspecified extremity: Secondary | ICD-10-CM

## 2012-08-09 NOTE — Progress Notes (Signed)
[facility] [place]   Patient name: Adam Clements MRN: [id number] DOB: 04-Oct-1934 Sex: male   Referred by: Dr. Johney Frame  Reason for referral:  Chief Complaint  Patient presents with  . PVD    est pt last OV 09/04/2008 - Dr. Sharlene Motts referring for PVD pt c/o intermittent claudication/pain RT leg moreso but states that his sx have got better since starting pletal  several years ago    HISTORY OF PRESENT ILLNESS: Patient comes in for continued followup of his peripheral vascular disease. The patient was last seen in this office in 2010. At that time he was having severe bilateral lower extremity claudication, right greater than left. At that time his ABI was 0.37 on the right and 0.63 on the left. Arteriography revealed right superficial femoral artery occlusion and occlusion of all of his runoff vessels. I contemplated a possible blind segment femoral-popliteal bypass graft however he had an excellent response to Pletal. He continues to be stable with regards to his symptoms. He states that he walks on a treadmill 3 times a week and can walk 1 mile. He also is an avid Marine scientist. He had difficulty at the beginning however the more he walks the further he can go. He denies rest pain or nonhealing ulcers.  The patient suffers from cardiac disease. He has a history of bradycardia as well as nonischemic cardiomyopathy. He is medically managed for his hypertension. He is a former smoker who quit in 1984.  Past Medical History  Diagnosis Date  . Allergic rhinitis   . Diverticulosis of colon     conoscopy 12/1993 (Dr. Madilyn Fireman) S/G divertics//colonoscopy L&R divertics (Dr. Madilyn Fireman) 11/08/2004  . PVC (premature ventricular contraction)   . Bradycardia   . Gout   . HTN (hypertension)   . PVD (peripheral vascular disease)     followed by Dr Myra Gianotti  . Mitral regurgitation     Pulmonic regurgitation, with marked LA enlargement  . CAD (coronary artery disease)     nonobstructive  .  Nonischemic cardiomyopathy     EF 40%  . Arthritis     Gout  . BPH (benign prostatic hypertrophy)     Past Surgical History  Procedure Laterality Date  . Appendectomy    . Dexa (secondary shrunk 2") wnl 07/06/03    . Abd/pelvic ct horseshoe kidney 10/10/04      History   Social History  . Marital Status: Married    Spouse Name: N/A    Number of Children: N/A  . Years of Education: N/A   Occupational History  . Not on file.   Social History Main Topics  . Smoking status: Former Smoker -- 2.00 packs/day    Types: Cigarettes    Quit date: 11/10/1982  . Smokeless tobacco: Former Neurosurgeon  . Alcohol Use: 8.4 oz/week    10 Glasses of wine, 4 Cans of beer per week     Comment: daily  . Drug Use: No  . Sexually Active: No   Other Topics Concern  . Not on file   Social History Narrative   Activity: goes to gym, works for [employer]   Diet: healthy, leafy greens, good meat intake weekly, good water    Family History  Problem Relation Age of Onset  . Hypertension    . Heart disease Mother 67    Natural causes with CHF  . Hypertension Mother   . Hypertension Father   . Stroke Father     cerebral  hemorrhage  . Hyperlipidemia Brother   . Heart disease Brother     before age 27  . Hypertension Brother   . Heart attack Brother     Allergies as of 08/09/2012  . (No Known Allergies)    Current Outpatient Prescriptions on File Prior to Visit  Medication Sig Dispense Refill  . Ascorbic Acid (VITAMIN C PO) Take 1 tablet by mouth daily.      Marland Kitchen aspirin 325 MG tablet Take 325 mg by mouth daily.        Marland Kitchen CALCIUM-VITAMIN D PO Take 600 mg by mouth daily. 2 tablets once daily      . cilostazol (PLETAL) 100 MG tablet take 1 tablet by mouth twice a day  60 tablet  6  . psyllium (FIBER THERAPY) 0.52 G capsule Take two by mouth twice a day        No current facility-administered medications on file prior to visit.     REVIEW OF SYSTEMS: Cardiovascular: Positive for  pain in legs with walking, history of phlebitis, varicose veins Pulmonary: No productive cough, asthma or wheezing. Neurologic: Positive for numbness in his legs and occasional dizziness  Hematologic: No bleeding problems or clotting disorders. Musculoskeletal: No joint pain or joint swelling. Gastrointestinal: No blood in stool or hematemesis Genitourinary: No dysuria or hematuria. Psychiatric:: No history of major depression. Integumentary: No rashes or ulcers. Constitutional: No fever or chills.  PHYSICAL EXAMINATION: General: The patient appears their stated age.  Vital signs are BP 132/81  Pulse 78  Ht 5\' 9"  (1.753 m)  Wt 159 lb 6.4 oz (72.303 kg)  BMI 23.53 kg/m2  SpO2 100% HEENT:  No gross abnormalities Pulmonary: Respirations are non-labored Musculoskeletal: There are no major deformities.   Neurologic: No focal weakness or paresthesias are detected, Skin: There are no ulcer or rashes noted. Psychiatric: The patient has normal affect. Cardiovascular: There is a regular rate and rhythm without significant murmur appreciated. Pedal pulses are not palpable  Diagnostic Studies: ABI on the right is 0.56 which is an increase from 0.37. On the left it measures 0.65.  Outside Studies/Documentation    Assessment:  Peripheral vascular disease with claudication, bilateral Plan: The patient's symptoms have remained stable over the past 78 years. He does have some limitations with his activity level, however these are not lifestyle limiting. He does not have rest pain or nonhealing ulcers. I have recommended continued medical management. This would include continuing his Pletal as well as aspirin. He will need close monitoring of his blood pressure which she states is under good control. In addition should he have hypercholesterolemia, he would need to be on a statin. If he develops a nonhealing wound or worsening pain the patient has been instructed to contact me. Should this occur, he  would need to undergo repeat angiography. He will contact me should that occur     V. Charlena Cross, M.D. [facility] [place] Office: [phone] Pager:  [phone]

## 2012-11-03 ENCOUNTER — Other Ambulatory Visit (INDEPENDENT_AMBULATORY_CARE_PROVIDER_SITE_OTHER): Payer: Medicare Other

## 2012-11-03 ENCOUNTER — Other Ambulatory Visit: Payer: Self-pay | Admitting: Family Medicine

## 2012-11-03 DIAGNOSIS — R634 Abnormal weight loss: Secondary | ICD-10-CM | POA: Diagnosis not present

## 2012-11-03 DIAGNOSIS — N400 Enlarged prostate without lower urinary tract symptoms: Secondary | ICD-10-CM

## 2012-11-03 DIAGNOSIS — I5020 Unspecified systolic (congestive) heart failure: Secondary | ICD-10-CM

## 2012-11-03 DIAGNOSIS — E749 Disorder of carbohydrate metabolism, unspecified: Secondary | ICD-10-CM

## 2012-11-03 DIAGNOSIS — I10 Essential (primary) hypertension: Secondary | ICD-10-CM | POA: Diagnosis not present

## 2012-11-03 DIAGNOSIS — D509 Iron deficiency anemia, unspecified: Secondary | ICD-10-CM | POA: Diagnosis not present

## 2012-11-03 LAB — COMPREHENSIVE METABOLIC PANEL
ALT: 15 U/L (ref 0–53)
AST: 21 U/L (ref 0–37)
CO2: 27 mEq/L (ref 19–32)
Calcium: 9.1 mg/dL (ref 8.4–10.5)
Chloride: 106 mEq/L (ref 96–112)
GFR: 58 mL/min — ABNORMAL LOW (ref >60.00)
Sodium: 137 mEq/L (ref 135–145)
Total Protein: 5.6 g/dL — ABNORMAL LOW (ref 6.0–8.3)

## 2012-11-03 LAB — CBC WITH DIFFERENTIAL/PLATELET
Eosinophils Relative: 5.5 % — ABNORMAL HIGH (ref 0.0–5.0)
HCT: 35.5 % — ABNORMAL LOW (ref 39.0–52.0)
Lymphocytes Relative: 24 % (ref 12.0–46.0)
Monocytes Relative: 10.1 % (ref 3.0–12.0)
Neutrophils Relative %: 59.9 % (ref 43.0–77.0)
Platelets: 245 10*3/uL (ref 150.0–400.0)
WBC: 4.1 10*3/uL — ABNORMAL LOW (ref 4.5–10.5)

## 2012-11-03 LAB — PSA: PSA: 1.45 ng/mL (ref 0.10–4.00)

## 2012-11-03 LAB — IBC PANEL: Saturation Ratios: 37.8 % (ref 20.0–50.0)

## 2012-11-10 ENCOUNTER — Ambulatory Visit (INDEPENDENT_AMBULATORY_CARE_PROVIDER_SITE_OTHER): Payer: Medicare Other | Admitting: Family Medicine

## 2012-11-10 ENCOUNTER — Encounter: Payer: Self-pay | Admitting: Family Medicine

## 2012-11-10 VITALS — BP 118/76 | HR 80 | Temp 97.8°F | Ht 69.0 in | Wt 155.2 lb

## 2012-11-10 DIAGNOSIS — I10 Essential (primary) hypertension: Secondary | ICD-10-CM | POA: Diagnosis not present

## 2012-11-10 DIAGNOSIS — Z0000 Encounter for general adult medical examination without abnormal findings: Secondary | ICD-10-CM | POA: Diagnosis not present

## 2012-11-10 DIAGNOSIS — Z1211 Encounter for screening for malignant neoplasm of colon: Secondary | ICD-10-CM | POA: Diagnosis not present

## 2012-11-10 NOTE — Patient Instructions (Signed)
Let's do stool kit today.  If normal, we may wait until next year to return to see Dr. Madilyn Fireman. Bring me a copy of your living will to update your chart. Schedule eye exam as you're due Good to see you today ! Call us with questions.

## 2012-11-10 NOTE — Progress Notes (Signed)
Subjective:    Patient ID: Adam Clements, male    DOB: 1934/12/26, 78 y.o.   MRN: [id number]  HPI CC: medicare wellness visit  Iron deficiency - even slow release upset stomach so he stopped taking.   Wt Readings from Last 3 Encounters:  11/10/12 155 lb 4 oz (70.421 kg)  08/09/12 159 lb 6.4 oz (72.303 kg)  06/28/12 159 lb 9.6 oz (72.394 kg)    After workout, blood pressure drops.  Works out for 1 hour - 15 min walking, 30 min stretching, 15 min weight lifting.  Some dizziness with this.  H/o claudication - on compression stocking which have helped, also on aspiirn 325 and cilostazol.   Passes vision screen.  Wears hearing aides. Denies depression/sadness, anhedonia or falls in the past year.  Preventative:  Colonoscopy - 2006 by Dr. Madilyn Fireman - R and L sided diverticulosis, rec rpt in 10 yrs.  Saw GI last year 2/2 weight loss and IDA.  Decided to do yearly hemoccults until next colonoscopy 2016, continue slow release iron daily. Prostate screening - normal PSA last year, normal this year. H/o BPH. Pt would like to continue for next few years. Nocturia x1. Strong stream.  Pneumovax 2004  Td 2007, again 2012 in [place] after dog bite.  zostavax 2009.  Flu shot - did receive last year.  Advanced directives: has living will at home -done through attorney. HCPOA is wife then son and then oldest daughter etc.   Activity: goes to gym, works for [employer]  Diet: healthy, leafy greens, good meat intake weekly, good water  Medications and allergies reviewed and updated in chart.  Past histories reviewed and updated if relevant as below. Patient Active Problem List   Diagnosis Date Noted  . Atherosclerosis of native arteries of the extremities with intermittent claudication 08/09/2012  . Medicare annual wellness visit, subsequent 11/10/2011  . HTN (hypertension)   . IDA (iron deficiency anemia) 09/18/2011  . Weight loss 09/12/2011  . Postural dizziness 03/12/2011  . Chronic  systolic dysfunction of left ventricle 07/18/2010  . PREMATURE VENTRICULAR CONTRACTIONS, FREQUENT 11/15/2008  . UNSPECIFIED SYSTOLIC HEART FAILURE 11/15/2008  . MITRAL REGURGITATION 08/25/2008  . BRADYCARDIA 07/10/2008  . OTHER DISORDERS OF PLASMA PROTEIN METABOLISM 01/05/2008  . BICEPS TENDON RUPTURE, LEFT 10/01/2007  . PALPITATIONS 12/29/2006  . CARBOHYDRATE METABOLISM DISORDER 10/07/2006  . GOUT 10/07/2006  . PERIPHERAL VASCULAR DISEASE 10/07/2006  . ALLERGIC RHINITIS 10/07/2006  . DIVERTICULOSIS, COLON 10/07/2006  . HYPRTRPHY PROSTATE BNG W/O URINARY OBST/LUTS 10/07/2006  . ROSACEA 10/07/2006   Past Medical History  Diagnosis Date  . Allergic rhinitis   . Diverticulosis of colon     conoscopy 12/1993 (Dr. Madilyn Fireman) S/G divertics//colonoscopy L&R divertics (Dr. Madilyn Fireman) 11/08/2004  . PVC (premature ventricular contraction)   . Bradycardia   . Gout   . HTN (hypertension)   . PVD (peripheral vascular disease)     followed by Dr Myra Gianotti, treating medically (pletal and aspirin)  . Mitral regurgitation     Pulmonic regurgitation, with marked LA enlargement  . CAD (coronary artery disease)     nonobstructive  . Nonischemic cardiomyopathy     EF 40%  . Arthritis     Gout  . BPH (benign prostatic hypertrophy)    Past Surgical History  Procedure Laterality Date  . Appendectomy    . Dexa (secondary shrunk 2") wnl 07/06/03    . Abd/pelvic ct horseshoe kidney 10/10/04     History  Substance Use Topics  .  Smoking status: Former Smoker -- 2.00 packs/day    Types: Cigarettes    Quit date: 11/10/1982  . Smokeless tobacco: Former Neurosurgeon  . Alcohol Use: 8.4 oz/week    10 Glasses of wine, 4 Cans of beer per week     Comment: daily   Family History  Problem Relation Age of Onset  . Hypertension    . Heart disease Mother 19    Natural causes with CHF  . Hypertension Mother   . Hypertension Father   . Stroke Father     cerebral hemorrhage  . Hyperlipidemia Brother   . Heart  disease Brother     before age 51  . Hypertension Brother   . Heart attack Brother    No Known Allergies Current Outpatient Prescriptions on File Prior to Visit  Medication Sig Dispense Refill  . Ascorbic Acid (VITAMIN C PO) Take 1 tablet by mouth daily.      Marland Kitchen aspirin 325 MG tablet Take 325 mg by mouth daily.        Marland Kitchen CALCIUM-VITAMIN D PO Take 600 mg by mouth daily. 2 tablets once daily      . cilostazol (PLETAL) 100 MG tablet take 1 tablet by mouth twice a day  60 tablet  6  . psyllium (FIBER THERAPY) 0.52 G capsule Take two by mouth twice a day        No current facility-administered medications on file prior to visit.     Review of Systems  Constitutional: Negative for fever, chills, activity change, appetite change, fatigue and unexpected weight change.  HENT: Negative for hearing loss and neck pain.   Eyes: Negative for visual disturbance.  Respiratory: Negative for cough, chest tightness, shortness of breath and wheezing.   Cardiovascular: Negative for chest pain, palpitations and leg swelling.  Gastrointestinal: Negative for nausea, vomiting, abdominal pain, diarrhea, constipation, blood in stool and abdominal distention.  Genitourinary: Negative for hematuria and difficulty urinating.  Musculoskeletal: Negative for myalgias and arthralgias.  Skin: Negative for rash.  Neurological: Negative for dizziness, seizures, syncope and headaches.  Hematological: Negative for adenopathy. Does not bruise/bleed easily.  Psychiatric/Behavioral: Negative for dysphoric mood. The patient is not nervous/anxious.        Objective:   Physical Exam  Nursing note and vitals reviewed. Constitutional: He is oriented to person, place, and time. He appears well-developed and well-nourished. No distress.  HENT:  Head: Normocephalic and atraumatic.  Right Ear: Hearing, tympanic membrane, external ear and ear canal normal.  Left Ear: Hearing, tympanic membrane, external ear and ear canal normal.   Nose: Nose normal.  Mouth/Throat: Oropharynx is clear and moist. No oropharyngeal exudate.  Eyes: Conjunctivae and EOM are normal. Pupils are equal, round, and reactive to light. No scleral icterus.  Neck: Normal range of motion. Neck supple. Carotid bruit is not present. No thyromegaly present.  Cardiovascular: Normal rate, regular rhythm, normal heart sounds and intact distal pulses.   No murmur heard. Pulses:      Radial pulses are 2+ on the right side, and 2+ on the left side.  Pulmonary/Chest: Effort normal and breath sounds normal. No respiratory distress. He has no wheezes. He has no rales.  Abdominal: Soft. Bowel sounds are normal. He exhibits no distension and no mass. There is no tenderness. There is no rebound and no guarding.  Musculoskeletal: Normal range of motion. He exhibits no edema.  Lymphadenopathy:    He has no cervical adenopathy.  Neurological: He is alert and oriented to person,  place, and time.  CN grossly intact, station and gait intact  Skin: Skin is warm and dry. No rash noted.  Psychiatric: He has a normal mood and affect. His behavior is normal. Judgment and thought content normal.       Assessment & Plan:

## 2012-11-10 NOTE — Assessment & Plan Note (Addendum)
I have personally reviewed the Medicare Annual Wellness questionnaire and have noted 1. The patient's medical and social history 2. Their use of alcohol, tobacco or illicit drugs 3. Their current medications and supplements 4. The patient's functional ability including ADL's, fall risks, home safety risks and hearing or visual impairment. 5. Diet and physical activity 6. Evidence for depression or mood disorders The patients weight, height, BMI have been recorded in the chart.  Hearing and vision has been addressed. I have made referrals, counseling and provided education to the patient based review of the above and I have provided the pt with a written personalized care plan for preventive services. See scanned questionairre. Advanced directives discussed: will bring me copy of advanced directives  Reviewed preventative protocols and updated unless pt declined. PSA/DRE reassuring - pt wants to continue screening for now. Sent home with iFO[employer] today.

## 2012-11-10 NOTE — Assessment & Plan Note (Signed)
This has resolved.

## 2012-11-14 ENCOUNTER — Encounter: Payer: Self-pay | Admitting: Family Medicine

## 2012-11-15 ENCOUNTER — Other Ambulatory Visit: Payer: Medicare Other

## 2012-11-15 DIAGNOSIS — Z1211 Encounter for screening for malignant neoplasm of colon: Secondary | ICD-10-CM

## 2012-11-15 LAB — FECAL OCCULT BLOOD, GUAIAC: Fecal Occult Blood: NEGATIVE

## 2012-11-17 ENCOUNTER — Encounter: Payer: Self-pay | Admitting: Family Medicine

## 2012-11-17 ENCOUNTER — Encounter: Payer: Self-pay | Admitting: *Deleted

## 2012-11-26 ENCOUNTER — Other Ambulatory Visit: Payer: Self-pay | Admitting: Family Medicine

## 2012-12-03 ENCOUNTER — Ambulatory Visit (INDEPENDENT_AMBULATORY_CARE_PROVIDER_SITE_OTHER): Payer: Medicare Other | Admitting: Family Medicine

## 2012-12-03 ENCOUNTER — Encounter: Payer: Self-pay | Admitting: Family Medicine

## 2012-12-03 VITALS — BP 136/90 | HR 84 | Temp 98.1°F | Wt 154.5 lb

## 2012-12-03 DIAGNOSIS — S51809A Unspecified open wound of unspecified forearm, initial encounter: Secondary | ICD-10-CM | POA: Diagnosis not present

## 2012-12-03 DIAGNOSIS — Z23 Encounter for immunization: Secondary | ICD-10-CM

## 2012-12-03 DIAGNOSIS — S51812A Laceration without foreign body of left forearm, initial encounter: Secondary | ICD-10-CM | POA: Insufficient documentation

## 2012-12-03 NOTE — Addendum Note (Signed)
Addended by: Josph Macho A on: 12/03/2012 10:47 AM   Modules accepted: Orders

## 2012-12-03 NOTE — Assessment & Plan Note (Addendum)
Outside of window to suture lac, and relatively small, no further bleeding (despite pt on asa and pletal).  Relatively superficial lac, no evidence of tendon involvement, anticipate more forearm flexor muscle contusion from window injury. Treat with compression, elevation, rest. Red flags to return discussed (ie concern for infection) Pt agrees with plan.

## 2012-12-03 NOTE — Progress Notes (Signed)
  Subjective:    Patient ID: Adam Clements, male    DOB: Mar 29, 1934, 78 y.o.   MRN: [id number]  HPI CC: check L arm injury  DOI: 12/01/2012 Working with [employer], 2 story house.  From inside sealing 2nd story window.  As he took out seal strip, window fell down on him, swung on left anterior wrist.  Treated with pressure bandage.  Since then, when he moves hand certain way feels contraction of forearm wrist flexors - describes severe cramp.  Notices more when lathering hands with soap.  H/o L arm biceps rupture 3 yrs ago. H/o right hand tendon injury in remote past.  Past Medical History  Diagnosis Date  . Allergic rhinitis   . Diverticulosis of colon     conoscopy 12/1993 (Dr. Madilyn Fireman) S/G divertics//colonoscopy L&R divertics (Dr. Madilyn Fireman) 11/08/2004  . PVC (premature ventricular contraction)   . Bradycardia   . Gout   . HTN (hypertension)   . PVD (peripheral vascular disease)     followed by Dr Myra Gianotti, treating medically (pletal and aspirin)  . Mitral regurgitation     Pulmonic regurgitation, with marked LA enlargement  . CAD (coronary artery disease)     nonobstructive  . Nonischemic cardiomyopathy     EF 40%  . Arthritis     Gout  . BPH (benign prostatic hypertrophy)      Review of Systems Per HPI    Objective:   Physical Exam  Nursing note and vitals reviewed. Constitutional: He appears well-developed and well-nourished. No distress.  Cardiovascular:  Pulses:      Radial pulses are 2+ on the right side, and 2+ on the left side.  Musculoskeletal: He exhibits no edema.  L anterior forearm with ~1.5cm lac transverse on arm. No surrounding erythema or induration or drainage. Able to flex wrist and all digits individually. Tender just proximal to lac at forearm flexor muscle bulk Tender at lac to testing of flexion against resistance Sensation intact       Assessment & Plan:

## 2012-12-03 NOTE — Patient Instructions (Addendum)
Flu shot today. You had a tetanus shot 2012 so no need for another I want you to rest left arm and wrist to allow for speedy recovery. Continue antibiotic ointment and wrapping forearm. Monitor for spreading redness or draining pus - if this happens return right away or seek urgent. Good to see you today, call us with questions.

## 2013-01-22 DIAGNOSIS — H2510 Age-related nuclear cataract, unspecified eye: Secondary | ICD-10-CM | POA: Diagnosis not present

## 2013-03-22 ENCOUNTER — Encounter: Payer: Self-pay | Admitting: Family Medicine

## 2013-03-22 ENCOUNTER — Ambulatory Visit (INDEPENDENT_AMBULATORY_CARE_PROVIDER_SITE_OTHER): Payer: Medicare Other | Admitting: Family Medicine

## 2013-03-22 VITALS — BP 124/66 | HR 88 | Temp 98.0°F | Wt 156.8 lb

## 2013-03-22 DIAGNOSIS — R42 Dizziness and giddiness: Secondary | ICD-10-CM

## 2013-03-22 LAB — CBC WITH DIFFERENTIAL/PLATELET
Basophils Relative: 0.4 % (ref 0.0–3.0)
Eosinophils Relative: 4.3 % (ref 0.0–5.0)
HCT: 33.8 % — ABNORMAL LOW (ref 39.0–52.0)
Lymphs Abs: 1.3 10*3/uL (ref 0.7–4.0)
MCV: 95.7 fl (ref 78.0–100.0)
Monocytes Absolute: 0.5 10*3/uL (ref 0.1–1.0)
Neutro Abs: 3.5 10*3/uL (ref 1.4–7.7)
Platelets: 289 10*3/uL (ref 150.0–400.0)
WBC: 5.5 10*3/uL (ref 4.5–10.5)

## 2013-03-22 NOTE — Assessment & Plan Note (Signed)
Describes post exertional dizziness - in known h/o mild systolic CHF and PVD, as well as with evidence of varicose veins. Anticipate combination of decreased periph vascular resistance after exercise and sCHF. Recommended good hydration status to keep tank full, and recommended starting use of compression stockings (20-22mmHg) If not better with this, may recommend he return to see Dr. Johney Frame - ?rpt echo. Did not advise him to increase salt in diet given h/o sCHF. Doubt sick sinus or other etiology.

## 2013-03-22 NOTE — Patient Instructions (Addendum)
I think this is a combination of dilated veins in legs as well as some due to your heart. I'd like to check blood work today to follow your history of mild anemia. I'd like you to start knee high compression stockings during the day - prescription provided today. Always make sure to stay well hydrated. If this doesn't help, I may suggest return to see Dr. Johney Frame for further evaluation.

## 2013-03-22 NOTE — Progress Notes (Signed)
Pre-visit discussion using our clinic review tool. No additional management support is needed unless otherwise documented below in the visit note.  

## 2013-03-22 NOTE — Progress Notes (Signed)
   Subjective:    Patient ID: Adam Clements, male    DOB: 1934/05/19, 78 y.o.   MRN: [id number]  HPI CC: dizziness  Noticing increasing frequency and duration of dizzy episodes.  Dizziness occurs with any exertion.  No chest pain, dyspnea, palpitations, headache.   No h/o HTN. H/o syncopal episodes when in hot tub. Notes blood pressure drops after exercise - down to 60/30, symptomatic with these lows.  Heart rate 90-100 during these episodes.  This has been an ongoing issue for several years, but blood pressure seem to be lower than previously.  Affects vision as well.  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  H/o PVCs. H/o PVD - on pletal.  Continued intermittent claudication symptoms with first half mile then sxs improve. Reviewed EKG from 06/2012 cardiology appt - RBBB, one PVC. Last echo - nonischemic CM with mild systolic CHF with EF 40%, mild to mod regurg, mildly increased systolic pressure.  Past Medical History  Diagnosis Date  . Allergic rhinitis   . Diverticulosis of colon     conoscopy 12/1993 (Dr. Madilyn Fireman) S/G divertics//colonoscopy L&R divertics (Dr. Madilyn Fireman) 11/08/2004  . PVC (premature ventricular contraction)   . Bradycardia   . Gout   . HTN (hypertension)   . PVD (peripheral vascular disease)     followed by Dr Myra Gianotti, treating medically (pletal and aspirin)  . Mitral regurgitation     Pulmonic regurgitation, with marked LA enlargement  . CAD (coronary artery disease)     nonobstructive  . Nonischemic cardiomyopathy     EF 40%  . Arthritis     Gout  . BPH (benign prostatic hypertrophy)     Family History  Problem Relation Age of Onset  . Hypertension    . Heart disease Mother 29    Natural causes with CHF  . Hypertension Mother   . Hypertension Father   . Stroke Father     cerebral hemorrhage  . Hyperlipidemia Brother   . Heart disease Brother     before age 5  . Hypertension Brother   . Heart attack Brother     Review of Systems Per  HPI    Objective:   Physical Exam  Nursing note and vitals reviewed. Constitutional: He appears well-developed and well-nourished. No distress.  HENT:  Mouth/Throat: Oropharynx is clear and moist. No oropharyngeal exudate.  Neck: Normal range of motion. Neck supple.  Cardiovascular: Normal rate, regular rhythm, normal heart sounds and intact distal pulses.   No murmur heard. Rare ectopy  Pulmonary/Chest: Effort normal and breath sounds normal. No respiratory distress. He has no wheezes. He has no rales.  Musculoskeletal: He exhibits no edema.  Varicose veins R>L       Assessment & Plan:

## 2013-03-23 ENCOUNTER — Encounter: Payer: Self-pay | Admitting: Family Medicine

## 2013-03-24 DIAGNOSIS — D049 Carcinoma in situ of skin, unspecified: Secondary | ICD-10-CM

## 2013-03-24 HISTORY — DX: Carcinoma in situ of skin, unspecified: D04.9

## 2013-05-24 ENCOUNTER — Telehealth: Payer: Self-pay

## 2013-05-24 DIAGNOSIS — I739 Peripheral vascular disease, unspecified: Secondary | ICD-10-CM

## 2013-05-24 DIAGNOSIS — I872 Venous insufficiency (chronic) (peripheral): Secondary | ICD-10-CM

## 2013-05-24 NOTE — Telephone Encounter (Signed)
Pt brought copies of EOB and copy of prescription for compression stocking written 02/2013. Pt is self filing for insurance. Request diagnosis code changed on prescription. Copy of EOB and rx is in Dr Synthia Innocent in box.Please advise.

## 2013-05-26 ENCOUNTER — Encounter (HOSPITAL_COMMUNITY): Payer: Self-pay | Admitting: Emergency Medicine

## 2013-05-26 ENCOUNTER — Emergency Department (HOSPITAL_COMMUNITY)
Admission: EM | Admit: 2013-05-26 | Discharge: 2013-05-27 | Disposition: A | Discharge status: Home / Self Care | Payer: Medicare Other | Attending: Emergency Medicine | Admitting: Emergency Medicine

## 2013-05-26 DIAGNOSIS — R197 Diarrhea, unspecified: Secondary | ICD-10-CM | POA: Insufficient documentation

## 2013-05-26 DIAGNOSIS — I10 Essential (primary) hypertension: Secondary | ICD-10-CM | POA: Diagnosis not present

## 2013-05-26 DIAGNOSIS — Z8719 Personal history of other diseases of the digestive system: Secondary | ICD-10-CM | POA: Insufficient documentation

## 2013-05-26 DIAGNOSIS — Z87448 Personal history of other diseases of urinary system: Secondary | ICD-10-CM | POA: Insufficient documentation

## 2013-05-26 DIAGNOSIS — R404 Transient alteration of awareness: Secondary | ICD-10-CM | POA: Diagnosis present

## 2013-05-26 DIAGNOSIS — E860 Dehydration: Secondary | ICD-10-CM

## 2013-05-26 DIAGNOSIS — Z87891 Personal history of nicotine dependence: Secondary | ICD-10-CM | POA: Diagnosis not present

## 2013-05-26 DIAGNOSIS — Z8709 Personal history of other diseases of the respiratory system: Secondary | ICD-10-CM | POA: Insufficient documentation

## 2013-05-26 DIAGNOSIS — R55 Syncope and collapse: Secondary | ICD-10-CM | POA: Diagnosis not present

## 2013-05-26 DIAGNOSIS — R5381 Other malaise: Secondary | ICD-10-CM | POA: Diagnosis not present

## 2013-05-26 DIAGNOSIS — Z79899 Other long term (current) drug therapy: Secondary | ICD-10-CM | POA: Diagnosis not present

## 2013-05-26 DIAGNOSIS — M129 Arthropathy, unspecified: Secondary | ICD-10-CM | POA: Insufficient documentation

## 2013-05-26 DIAGNOSIS — Z862 Personal history of diseases of the blood and blood-forming organs and certain disorders involving the immune mechanism: Secondary | ICD-10-CM | POA: Diagnosis not present

## 2013-05-26 DIAGNOSIS — I872 Venous insufficiency (chronic) (peripheral): Secondary | ICD-10-CM | POA: Insufficient documentation

## 2013-05-26 DIAGNOSIS — Z7982 Long term (current) use of aspirin: Secondary | ICD-10-CM | POA: Diagnosis not present

## 2013-05-26 DIAGNOSIS — I2510 Atherosclerotic heart disease of native coronary artery without angina pectoris: Secondary | ICD-10-CM | POA: Insufficient documentation

## 2013-05-26 DIAGNOSIS — R5383 Other fatigue: Secondary | ICD-10-CM | POA: Diagnosis not present

## 2013-05-26 LAB — CBC WITH DIFFERENTIAL/PLATELET
Basophils Absolute: 0 10*3/uL (ref 0.0–0.1)
Basophils Relative: 0 % (ref 0–1)
Eosinophils Absolute: 0.1 10*3/uL (ref 0.0–0.7)
Eosinophils Relative: 2 % (ref 0–5)
HEMATOCRIT: 40.7 % (ref 39.0–52.0)
HEMOGLOBIN: 14.4 g/dL (ref 13.0–17.0)
LYMPHS ABS: 0.3 10*3/uL — AB (ref 0.7–4.0)
LYMPHS PCT: 4 % — AB (ref 12–46)
MCH: 32.7 pg (ref 26.0–34.0)
MCHC: 35.4 g/dL (ref 30.0–36.0)
MCV: 92.3 fL (ref 78.0–100.0)
MONO ABS: 0.4 10*3/uL (ref 0.1–1.0)
MONOS PCT: 6 % (ref 3–12)
NEUTROS ABS: 5.7 10*3/uL (ref 1.7–7.7)
NEUTROS PCT: 88 % — AB (ref 43–77)
Platelets: 233 10*3/uL (ref 150–400)
RBC: 4.41 MIL/uL (ref 4.22–5.81)
RDW: 12.5 % (ref 11.5–15.5)
WBC: 6.5 10*3/uL (ref 4.0–10.5)

## 2013-05-26 LAB — COMPREHENSIVE METABOLIC PANEL
ALBUMIN: 3.5 g/dL (ref 3.5–5.2)
ALK PHOS: 74 U/L (ref 39–117)
ALT: 15 U/L (ref 0–53)
AST: 21 U/L (ref 0–37)
BILIRUBIN TOTAL: 0.8 mg/dL (ref 0.3–1.2)
BUN: 34 mg/dL — AB (ref 6–23)
CHLORIDE: 102 meq/L (ref 96–112)
CO2: 18 meq/L — AB (ref 19–32)
Calcium: 9.2 mg/dL (ref 8.4–10.5)
Creatinine, Ser: 1.46 mg/dL — ABNORMAL HIGH (ref 0.50–1.35)
GFR calc Af Amer: 52 mL/min — ABNORMAL LOW (ref >90)
GFR, EST NON AFRICAN AMERICAN: 45 mL/min — AB (ref >90)
GLUCOSE: 118 mg/dL — AB (ref 70–99)
POTASSIUM: 4.7 meq/L (ref 3.7–5.3)
Sodium: 135 mEq/L — ABNORMAL LOW (ref 137–147)
Total Protein: 6.3 g/dL (ref 6.0–8.3)

## 2013-05-26 LAB — URINALYSIS, ROUTINE W REFLEX MICROSCOPIC
Bilirubin Urine: NEGATIVE
GLUCOSE, UA: NEGATIVE mg/dL
Ketones, ur: 15 mg/dL — AB
Nitrite: NEGATIVE
PROTEIN: NEGATIVE mg/dL
SPECIFIC GRAVITY, URINE: 1.023 (ref 1.005–1.030)
Urobilinogen, UA: 0.2 mg/dL (ref 0.0–1.0)
pH: 5.5 (ref 5.0–8.0)

## 2013-05-26 LAB — URINE MICROSCOPIC-ADD ON

## 2013-05-26 LAB — TROPONIN I: Troponin I: <0.3 ng/mL (ref <0.30)

## 2013-05-26 MED ORDER — SODIUM CHLORIDE 0.9 % IV BOLUS (SEPSIS)
1000.0000 mL | Freq: Once | INTRAVENOUS | Status: AC
  Administered 2013-05-26: 1000 mL via INTRAVENOUS

## 2013-05-26 MED ORDER — DICYCLOMINE HCL 10 MG PO CAPS
10.0000 mg | ORAL_CAPSULE | Freq: Once | ORAL | Status: AC
  Administered 2013-05-26: 10 mg via ORAL
  Filled 2013-05-26: qty 1

## 2013-05-26 MED ORDER — ONDANSETRON HCL 4 MG/2ML IJ SOLN: 4.0000 mg | Freq: Once | INTRAMUSCULAR | Status: DC

## 2013-05-26 MED ORDER — ONDANSETRON HCL 4 MG PO TABS: 4.0000 mg | ORAL_TABLET | Freq: Four times a day (QID) | ORAL | Status: DC

## 2013-05-26 MED ORDER — DICYCLOMINE HCL 20 MG PO TABS: 20.0000 mg | ORAL_TABLET | Freq: Two times a day (BID) | ORAL | Status: DC

## 2013-05-26 NOTE — ED Notes (Signed)
Pt nausea now

## 2013-05-26 NOTE — Telephone Encounter (Signed)
New script written out and placed in Adam Clements's box.  plz notify pt he may send this in instead. Also notify - use caution with compression stockings - if worsening leg pains would back off their use given his history of arterial insufficiency.

## 2013-05-26 NOTE — ED Provider Notes (Signed)
TIME SEEN: 9:30 PM  CHIEF COMPLAINT: Syncope, diarrhea  HPI: Patient is  79 year old male with a history of bradycardia, cardiac disease, nonischemic cardiomyopathy, hypertension, peripheral vascular disease who presents to the emergency department with complaints of greater than 8 episodes of nonbloody diarrhea and then a syncopal event today while on the toilet. He denies that he hit his head. He states her past off for several seconds. There is no tongue biting or incontinence. He denies any chest pain, shortness of breath, palpitations prior to syncopal episode currently. He did have one episode of nonbloody, nonbilious vomiting today. He denies he's had any fevers or chills. He has had diffuse crampy abdominal pain this is improved. Denies any sick contacts or recent hospitalization, recent travel or antibiotic use.   ROS: See HPI Constitutional: no fever  Eyes: no drainage  ENT: no runny nose   Cardiovascular:  no chest pain  Resp: no SOB  GI:  vomiting GU: no dysuria Integumentary: no rash  Allergy: no hives  Musculoskeletal: no leg swelling  Neurological: no slurred speech ROS otherwise negative  PAST MEDICAL HISTORY/PAST SURGICAL HISTORY:  Past Medical History  Diagnosis Date  . Allergic rhinitis   . Diverticulosis of colon     conoscopy 12/1993 (Dr. Amedeo Plenty) S/G divertics//colonoscopy L&R divertics (Dr. Amedeo Plenty) 11/08/2004  . PVC (premature ventricular contraction)   . Bradycardia   . Gout   . HTN (hypertension)   . PVD (peripheral vascular disease)     followed by Dr Trula Slade, treating medically (pletal and aspirin)  . Mitral regurgitation     Pulmonic regurgitation, with marked LA enlargement  . CAD (coronary artery disease)     nonobstructive  . Nonischemic cardiomyopathy     EF 40%  . Arthritis     Gout  . BPH (benign prostatic hypertrophy)   . Chronic anemia 2013    h/o IDA, did not tolerate oral iron, latest normal iron studies, B12, folate    MEDICATIONS:   Prior to Admission medications   Medication Sig Start Date End Date Taking? Authorizing Provider  Ascorbic Acid (VITAMIN C PO) Take 1 tablet by mouth 2 (two) times daily.    Yes Historical Provider, MD  aspirin 325 MG tablet Take 325 mg by mouth daily.     Yes Historical Provider, MD  CALCIUM-VITAMIN D PO Take 600 mg by mouth daily. 2 tablets once daily   Yes Historical Provider, MD  cilostazol (PLETAL) 100 MG tablet take 1 tablet by mouth twice a day 11/26/12  Yes Ria Bush, MD  psyllium (FIBER THERAPY) 0.52 G capsule Take two by mouth twice a day    Yes Historical Provider, MD    ALLERGIES:  No Known Allergies  SOCIAL HISTORY:  History  Substance Use Topics  . Smoking status: Former Smoker -- 2.00 packs/day    Types: Cigarettes    Quit date: 11/10/1982  . Smokeless tobacco: Former Systems developer  . Alcohol Use: 8.4 oz/week    10 Glasses of wine, 4 Cans of beer per week     Comment: daily    FAMILY HISTORY: Family History  Problem Relation Age of Onset  . Hypertension    . Heart disease Mother 43    Natural causes with CHF  . Hypertension Mother   . Hypertension Father   . Stroke Father     cerebral hemorrhage  . Hyperlipidemia Brother   . Heart disease Brother     before age 46  . Hypertension Brother   .  Heart attack Brother     EXAM: BP 123/71  Pulse 78  Temp(Src) 97.7 F (36.5 C) (Oral)  Resp 15  SpO2 98% CONSTITUTIONAL: Alert and oriented and responds appropriately to questions. Well-appearing; well-nourished HEAD: Normocephalic EYES: Conjunctivae clear, PERRL ENT: normal nose; no rhinorrhea; dry  mucous membranes; pharynx without lesions noted NECK: Supple, no meningismus, no LAD  CARD: RRR; S1 and S2 appreciated; no murmurs, no clicks, no rubs, no gallops RESP: Normal chest excursion without splinting or tachypnea; breath sounds clear and equal bilaterally; no wheezes, no rhonchi, no rales,  ABD/GI: Normal bowel sounds; non-distended; soft, non-tender, no  rebound, no guarding BACK:  The back appears normal and is non-tender to palpation, there is no CVA tenderness EXT: Normal ROM in all joints; non-tender to palpation; no edema; normal capillary refill; no cyanosis    SKIN: Normal color for age and race; warm NEURO: Moves all extremities equally PSYCH: The patient's mood and manner are appropriate. Grooming and personal hygiene are appropriate.  MEDICAL DECISION MAKING:  Patient here with diarrhea and syncopal event. Suspect a syncopal event was secondary to  Hypokalemia. He is significantly orthostatic. We'll obtain labs, urine. His abdominal exam is benign, do not feel he needs a dull imaging at this time. We'll give IV fluids.   ED PROGRESS:  Patient's labs show bicarbonate 18, anion gap of 15. He also has mildly elevated creatinine from his baseline. Urine shows trace hemoglobin small leukocytes but no other signs of infection and he has no urinary symptoms. He does have ketones in his urine. Patient's troponin is negative and his EKG shows no new ischemic changes. Will continue to hydrate and reassess. He may need admission for observation and continued hydration.    Patient is no longer orthostatic after 2 L of fluid. He reports his dizziness has resolved. He is having a mild abdominal cramping, will give Bentyl. Repeat abdominal exam is still very benign. Likely viral illness. Have recommended admission for observation and continued hydration the patient reports he would like to be discharged home. He lives at home with his wife who can monitor him. Patient's daughter at bedside is comfortable with this plan. Given strict return precautions.   EKG Interpretation  Date/Time:  Thursday May 26 2013 21:10:55 EST Ventricular Rate:  78 PR Interval:  154 QRS Duration: 160 QT Interval:  406 QTC Calculation: 462 R Axis:   85 Text Interpretation:  Sinus rhythm Multiform ventricular premature complexes Consider right atrial enlargement Right  bundle branch block No significant change since last tracing Confirmed by Christophere Hillhouse,  DO, Jonnette Nuon [phone]) on 05/26/2013 9:43:51 PM        Allison, DO 05/26/13 [id number]

## 2013-05-26 NOTE — Discharge Instructions (Signed)
Dehydration, Adult Dehydration is when you lose more fluids from the body than you take in. Vital organs like the kidneys, brain, and heart cannot function without a proper amount of fluids and salt. Any loss of fluids from the body can cause dehydration.  CAUSES   Vomiting.  Diarrhea.  Excessive sweating.  Excessive urine output.  Fever. SYMPTOMS  Mild dehydration  Thirst.  Dry lips.  Slightly dry mouth. Moderate dehydration  Very dry mouth.  Sunken eyes.  Skin does not bounce back quickly when lightly pinched and released.  Dark urine and decreased urine production.  Decreased tear production.  Headache. Severe dehydration  Very dry mouth.  Extreme thirst.  Rapid, weak pulse (more than 100 beats per minute at rest).  Cold hands and feet.  Not able to sweat in spite of heat and temperature.  Rapid breathing.  Blue lips.  Confusion and lethargy.  Difficulty being awakened.  Minimal urine production.  No tears. DIAGNOSIS  Your caregiver will diagnose dehydration based on your symptoms and your exam. Blood and urine tests will help confirm the diagnosis. The diagnostic evaluation should also identify the cause of dehydration. TREATMENT  Treatment of mild or moderate dehydration can often be done at home by increasing the amount of fluids that you drink. It is best to drink small amounts of fluid more often. Drinking too much at one time can make vomiting worse. Refer to the home care instructions below. Severe dehydration needs to be treated at the hospital where you will probably be given intravenous (IV) fluids that contain water and electrolytes. HOME CARE INSTRUCTIONS   Ask your caregiver about specific rehydration instructions.  Drink enough fluids to keep your urine clear or pale yellow.  Drink small amounts frequently if you have nausea and vomiting.  Eat as you normally do.  Avoid:  Foods or drinks high in sugar.  Carbonated  drinks.  Juice.  Extremely hot or cold fluids.  Drinks with caffeine.  Fatty, greasy foods.  Alcohol.  Tobacco.  Overeating.  Gelatin desserts.  Wash your hands well to avoid spreading bacteria and viruses.  Only take over-the-counter or prescription medicines for pain, discomfort, or fever as directed by your caregiver.  Ask your caregiver if you should continue all prescribed and over-the-counter medicines.  Keep all follow-up appointments with your caregiver. SEEK MEDICAL CARE IF:  You have abdominal pain and it increases or stays in one area (localizes).  You have a rash, stiff neck, or severe headache.  You are irritable, sleepy, or difficult to awaken.  You are weak, dizzy, or extremely thirsty. SEEK IMMEDIATE MEDICAL CARE IF:   You are unable to keep fluids down or you get worse despite treatment.  You have frequent episodes of vomiting or diarrhea.  You have blood or green matter (bile) in your vomit.  You have blood in your stool or your stool looks black and tarry.  You have not urinated in 6 to 8 hours, or you have only urinated a small amount of very dark urine.  You have a fever.  You faint. MAKE SURE YOU:   Understand these instructions.  Will watch your condition.  Will get help right away if you are not doing well or get worse. Document Released: 03/10/2005 Document Revised: 06/02/2011 Document Reviewed: 10/28/2010 [facility] Patient Information 2014 [place], [place].  Syncope Syncope is a fainting spell. This means the person loses consciousness and drops to the ground. The person is generally unconscious for less than 5 minutes.  The person may have some muscle twitches for up to 15 seconds before waking up and returning to normal. Syncope occurs more often in elderly people, but it can happen to anyone. While most causes of syncope are not dangerous, syncope can be a sign of a serious medical problem. It is important to seek medical care.   CAUSES  Syncope is caused by a sudden decrease in blood flow to the brain. The specific cause is often not determined. Factors that can trigger syncope include:  Taking medicines that lower blood pressure.  Sudden changes in posture, such as standing up suddenly.  Taking more medicine than prescribed.  Standing in one place for too long.  Seizure disorders.  Dehydration and excessive exposure to heat.  Low blood sugar (hypoglycemia).  Straining to have a bowel movement.  Heart disease, irregular heartbeat, or other circulatory problems.  Fear, emotional distress, seeing blood, or severe pain. SYMPTOMS  Right before fainting, you may:  Feel dizzy or lightheaded.  Feel nauseous.  See all white or all black in your field of vision.  Have cold, clammy skin. DIAGNOSIS  Your caregiver will ask about your symptoms, perform a physical exam, and perform electrocardiography (ECG) to record the electrical activity of your heart. Your caregiver may also perform other heart or blood tests to determine the cause of your syncope. TREATMENT  In most cases, no treatment is needed. Depending on the cause of your syncope, your caregiver may recommend changing or stopping some of your medicines. HOME CARE INSTRUCTIONS  Have someone stay with you until you feel stable.  Do not drive, operate machinery, or play sports until your caregiver says it is okay.  Keep all follow-up appointments as directed by your caregiver.  Lie down right away if you start feeling like you might faint. Breathe deeply and steadily. Wait until all the symptoms have passed.  Drink enough fluids to keep your urine clear or pale yellow.  If you are taking blood pressure or heart medicine, get up slowly, taking several minutes to sit and then stand. This can reduce dizziness. SEEK IMMEDIATE MEDICAL CARE IF:   You have a severe headache.  You have unusual pain in the chest, abdomen, or back.  You are bleeding  from the mouth or rectum, or you have black or tarry stool.  You have an irregular or very fast heartbeat.  You have pain with breathing.  You have repeated fainting or seizure-like jerking during an episode.  You faint when sitting or lying down.  You have confusion.  You have difficulty walking.  You have severe weakness.  You have vision problems. If you fainted, call your local emergency services (911 in [place] Do not drive yourself to the hospital.  MAKE SURE YOU:  Understand these instructions.  Will watch your condition.  Will get help right away if you are not doing well or get worse. Document Released: 03/10/2005 Document Revised: 09/09/2011 Document Reviewed: 05/09/2011 [facility] Patient Information 2014 [place], [place].  Rehydration, Elderly Rehydration is the replacement of body fluids lost during dehydration. Dehydration is an extreme loss of body fluids to the point of body function impairment. There are many ways extreme fluid loss can occur, including vomiting, diarrhea, or excess sweating. Recovering from dehydration requires replacing lost fluids, continuing to eat to maintain strength, and avoiding foods and beverages that may contribute to further fluid loss or may increase nausea. It is especially important for older adults to stay hydrated because dehydration can lead to  dizziness and falls. Some factors that can contribute to dehydration are more common in the elderly, including the use of prescription medicines and a decreased sensation of thirst.  HOW TO REHYDRATE In most cases, rehydration involves the replacement of not only fluids but also carbohydrates and basic body salts. Rehydration with an oral rehydration solution is one way to replace essential nutrients lost through dehydration. An oral rehydration solution can be purchased at pharmacies, retail stores, and online. Premixed packets of powder that you combine with water to make a solution are also  sold. You can prepare an oral rehydration solution at home by mixing the following ingredients together:      tsp table salt.   tsp baking soda.   tsp salt substitute containing potassium chloride.  1 tablespoons sugar.  1 L (34 oz) of water. Be sure to use exact measurements. Including too much sugar can make diarrhea worse. Drink  1 cup (120 240 mL) of oral rehydration solution each time you have diarrhea or vomit. If drinking this amount makes your vomiting worse, try drinking smaller amounts more often. For example, drink 1 3 tsp every 5 10 minutes.  A general rule for staying hydrated is to drink 1 2 L of fluid per day. Talk to your caregiver about the specific amount you should be drinking each day. Drink enough fluids to keep your urine clear or pale yellow. EATING WHEN DEHYDRATED  Even if you have had severe sweating or you are having diarrhea, do not stop eating. Many healthy items in a normal diet are okay to continue eating while recovering from dehydration. The following tips can help you to lessen nausea when you eat:  Ask someone else to prepare your food. Cooking smells may worsen nausea.  Eat in a well-ventilated room away from cooking smells.  Sit up when you eat. Avoid lying down until 1 2 hours after eating.  Eat small amounts when you eat.  Eat foods that are easy to digest. These include soft, well-cooked, or mashed foods. FOODS AND BEVERAGES TO AVOID Avoid eating or drinking the following foods and beverages that may increase nausea or further loss of fluid:   Fruit juices with a high sugar content, such as concentrated juices.  Alcohol.  Beverages containing caffeine.  Carbonated drinks. They may cause a lot of gas.  Foods that may cause a lot of gas, such as cabbage, broccoli, and beans.  Fatty, greasy, and fried foods.  Spicy, very salty, and very sweet foods or drinks.  Foods or drinks that are very hot or very cold. Consume food or drinks at or  near room temperature.  Foods that need a lot of chewing, such as raw vegetables.  Foods that are sticky or hard to swallow, such as peanut butter. Document Released: 06/02/2011 Document Revised: 12/03/2011 Document Reviewed: 06/02/2011 [facility] Patient Information 2014 [place], [place]. Viral Gastroenteritis Viral gastroenteritis is also known as stomach flu. This condition affects the stomach and intestinal tract. It can cause sudden diarrhea and vomiting. The illness typically lasts 3 to 8 days. Most people develop an immune response that eventually gets rid of the virus. While this natural response develops, the virus can make you quite ill. CAUSES  Many different viruses can cause gastroenteritis, such as rotavirus or noroviruses. You can catch one of these viruses by consuming contaminated food or water. You may also catch a virus by sharing utensils or other personal items with an infected person or by touching a contaminated surface.  SYMPTOMS  The most common symptoms are diarrhea and vomiting. These problems can cause a severe loss of body fluids (dehydration) and a body salt (electrolyte) imbalance. Other symptoms may include:  Fever.  Headache.  Fatigue.  Abdominal pain. DIAGNOSIS  Your caregiver can usually diagnose viral gastroenteritis based on your symptoms and a physical exam. A stool sample may also be taken to test for the presence of viruses or other infections. TREATMENT  This illness typically goes away on its own. Treatments are aimed at rehydration. The most serious cases of viral gastroenteritis involve vomiting so severely that you are not able to keep fluids down. In these cases, fluids must be given through an intravenous line (IV). HOME CARE INSTRUCTIONS   Drink enough fluids to keep your urine clear or pale yellow. Drink small amounts of fluids frequently and increase the amounts as tolerated.  Ask your caregiver for specific rehydration  instructions.  Avoid:  Foods high in sugar.  Alcohol.  Carbonated drinks.  Tobacco.  Juice.  Caffeine drinks.  Extremely hot or cold fluids.  Fatty, greasy foods.  Too much intake of anything at one time.  Dairy products until 24 to 48 hours after diarrhea stops.  You may consume probiotics. Probiotics are active cultures of beneficial bacteria. They may lessen the amount and number of diarrheal stools in adults. Probiotics can be found in yogurt with active cultures and in supplements.  Wash your hands well to avoid spreading the virus.  Only take over-the-counter or prescription medicines for pain, discomfort, or fever as directed by your caregiver. Do not give aspirin to children. Antidiarrheal medicines are not recommended.  Ask your caregiver if you should continue to take your regular prescribed and over-the-counter medicines.  Keep all follow-up appointments as directed by your caregiver. SEEK IMMEDIATE MEDICAL CARE IF:   You are unable to keep fluids down.  You do not urinate at least once every 6 to 8 hours.  You develop shortness of breath.  You notice blood in your stool or vomit. This may look like coffee grounds.  You have abdominal pain that increases or is concentrated in one small area (localized).  You have persistent vomiting or diarrhea.  You have a fever.  The patient is a child younger than 3 months, and he or she has a fever.  The patient is a child older than 3 months, and he or she has a fever and persistent symptoms.  The patient is a child older than 3 months, and he or she has a fever and symptoms suddenly get worse.  The patient is a baby, and he or she has no tears when crying. MAKE SURE YOU:   Understand these instructions.  Will watch your condition.  Will get help right away if you are not doing well or get worse. Document Released: 03/10/2005 Document Revised: 06/02/2011 Document Reviewed: 12/25/2010 [facility] Patient  Information 2014 [place].

## 2013-05-26 NOTE — ED Notes (Addendum)
Pt to ED via EMS for syncopal episode while in a bath room. Pt states he passed out for few seconds. Pt states he felt dizzy before he passed out. Pt denies hitting head. Pt reports vomiting. Pt reports watery diarrhea for 12hrs, had bowl movement x8, states had abdominal pain but denies now. Per EMS initial BP-95/73, HR-105, SpO2-96 on room air.

## 2013-05-27 NOTE — Telephone Encounter (Signed)
Patient notified and Rx mailed to patient.

## 2013-05-28 ENCOUNTER — Encounter: Payer: Self-pay | Admitting: Family Medicine

## 2013-06-03 ENCOUNTER — Encounter: Payer: Self-pay | Admitting: Family Medicine

## 2013-06-03 ENCOUNTER — Ambulatory Visit (INDEPENDENT_AMBULATORY_CARE_PROVIDER_SITE_OTHER): Payer: Medicare Other | Admitting: Family Medicine

## 2013-06-03 VITALS — BP 136/84 | HR 88 | Temp 97.5°F | Wt 161.0 lb

## 2013-06-03 DIAGNOSIS — I739 Peripheral vascular disease, unspecified: Secondary | ICD-10-CM | POA: Diagnosis not present

## 2013-06-03 DIAGNOSIS — I951 Orthostatic hypotension: Secondary | ICD-10-CM

## 2013-06-03 DIAGNOSIS — G903 Multi-system degeneration of the autonomic nervous system: Secondary | ICD-10-CM | POA: Insufficient documentation

## 2013-06-03 NOTE — Patient Instructions (Signed)
I think this all stems from your hydration status.  You are a bit more sensitive to blood pressure changes than normal. Continue medicines as up to now.  Orthostatic Hypotension Orthostatic hypotension is a sudden fall in blood pressure. It occurs when a person goes from a sitting or lying position to a standing position. CAUSES   Loss of body fluids (dehydration).  Medicines that lower blood pressure.  Sudden changes in posture, such as sudden standing when you have been sitting or lying down.  Taking too much of your medicine. SYMPTOMS   Lightheadedness or dizziness.  Fainting or near-fainting.  A fast heart rate (tachycardia).  Weakness.  Feeling tired (fatigue). DIAGNOSIS  Your caregiver may find the cause of orthostatic hypotension through:  A history and/or physical exam.  Checking your blood pressure. Your caregiver will check your blood pressure when you are:  Lying down.  Sitting.  Standing.  Tilt table testing. In this test, you are placed on a table that goes from a lying position to a standing position. You will be strapped to the table. This test helps to monitor your blood pressure and heart rate when you are in different positions. TREATMENT   If orthostatic hypotension is caused by your medicines, your caregiver will need to adjust your dosage. Do not stop or adjust your medicine on your own.  When changing positions, make these changes slowly. This allows your body to adjust to the different position.  Compression stockings that are worn on your lower legs may be helpful.  Your caregiver may have you consume extra salt. Do not add extra salt to your diet unless directed by your caregiver.  Eat frequent, small meals. Avoid sudden standing after eating.  Avoid hot showers or excessive heat.  Your caregiver may give you fluids through the vein (intravenous).  Your caregiver may put you on medicine to help enhance fluid retention. SEEK IMMEDIATE  MEDICAL CARE IF:   You faint or have a near-fainting episode. Call your local emergency services (911 in [place]  You have or develop chest pain.  You feel sick to your stomach (nauseous) or vomit.  You have a loss of feeling or movement in your arms or legs.  You have difficulty talking, slurred speech, or you are unable to talk.  You have difficulty thinking or have confused thinking. MAKE SURE YOU:   Understand these instructions.  Will watch your condition.  Will get help right away if you are not doing well or get worse. Document Released: 02/28/2002 Document Revised: 06/02/2011 Document Reviewed: 06/23/2008 [facility] Patient Information 2014 [place], [place].

## 2013-06-03 NOTE — Assessment & Plan Note (Addendum)
After presumed viral gastroenteritis. Discussed importance of good hydration status. Provided with handout on orthostatic hypotension. Supportive care as per instructions.

## 2013-06-03 NOTE — Progress Notes (Signed)
Pre visit review using our clinic review tool, if applicable. No additional management support is needed unless otherwise documented below in the visit note. 

## 2013-06-03 NOTE — Assessment & Plan Note (Signed)
Tolerating compression stockings despite this dx.

## 2013-06-03 NOTE — Progress Notes (Signed)
   BP 136/84  Pulse 88  Temp(Src) 97.5 F (36.4 C) (Oral)  Wt 161 lb (73.029 kg)   CC: ER f/u  Subjective:    Patient ID: Adam Clements, male    DOB: 1934/08/13, 79 y.o.   MRN: [id number]  HPI: Adam Clements is a 79 y.o. male presenting on 06/03/2013 for Follow-up   H/o nonischemic CM with EF 40%, PAD, CVI, mitral regurg. Recent ER evaluation 05/26/2013 for non- bloody diarrhea followed by syncope while on commode.  This was thought related to orthostasis from dehydration.  ER records reviewed.  BP down to 60/40 according to patient.  Treated with IVF and bentyl for abd cramping.  Blood work showed slight ARF with Cr 1.4 (baseline 1.3), normal K and CBC.  Dx with dehydration after viral gastroenteritis that led to orthostatic syncope.  Since child, every times he gets sick tends to pass out. H/o syncopal episodes when in hot tub.  Blood pressure drops after exercise - down to 60/30, symptomatic with these lows. Improved with improved hydration status. These have been ongoing issue for several years.  Drinks on average 6 glasses of water a day. Does drink wine and tea as well.  Relevant past medical, surgical, family and social history reviewed and updated as indicated.  Allergies and medications reviewed and updated. Current Outpatient Prescriptions on File Prior to Visit  Medication Sig  . Ascorbic Acid (VITAMIN C PO) Take 1 tablet by mouth 2 (two) times daily.   Marland Kitchen aspirin 325 MG tablet Take 325 mg by mouth daily.    Marland Kitchen CALCIUM-VITAMIN D PO Take 600 mg by mouth daily. 2 tablets once daily  . cilostazol (PLETAL) 100 MG tablet take 1 tablet by mouth twice a day  . psyllium (FIBER THERAPY) 0.52 G capsule Take two by mouth twice a day    No current facility-administered medications on file prior to visit.    Review of Systems Per HPI unless specifically indicated above    Objective:    BP 136/84  Pulse 88  Temp(Src) 97.5 F (36.4 C) (Oral)  Wt 161 lb (73.029 kg)  Physical  Exam  Nursing note and vitals reviewed. Constitutional: He appears well-developed and well-nourished. No distress.  HENT:  Head: Normocephalic and atraumatic.  Mouth/Throat: Oropharynx is clear and moist. No oropharyngeal exudate.  Eyes: Conjunctivae and EOM are normal. Pupils are equal, round, and reactive to light. No scleral icterus.  Cardiovascular: Normal rate, regular rhythm, normal heart sounds and intact distal pulses.   No murmur heard. Pulses:      Dorsalis pedis pulses are 2+ on the right side, and 2+ on the left side.  Pulmonary/Chest: Effort normal and breath sounds normal. No respiratory distress. He has no wheezes. He has no rales.  Musculoskeletal: He exhibits no edema.  Compression stockings on       Assessment & Plan:   Problem List Items Addressed This Visit   Orthostatic syncope     After presumed viral gastroenteritis. Discussed importance of good hydration status. Provided with handout on orthostatic hypotension. Supportive care as per instructions.    PAD (peripheral artery disease) - Primary     Tolerating compression stockings despite this dx.        Follow up plan: Return if symptoms worsen or fail to improve.

## 2013-06-29 ENCOUNTER — Other Ambulatory Visit: Payer: Self-pay | Admitting: Family Medicine

## 2013-08-02 ENCOUNTER — Other Ambulatory Visit: Payer: Self-pay

## 2013-08-02 DIAGNOSIS — B353 Tinea pedis: Secondary | ICD-10-CM | POA: Diagnosis not present

## 2013-08-02 DIAGNOSIS — L739 Follicular disorder, unspecified: Secondary | ICD-10-CM | POA: Diagnosis not present

## 2013-08-02 DIAGNOSIS — D0420 Carcinoma in situ of skin of unspecified ear and external auricular canal: Secondary | ICD-10-CM | POA: Diagnosis not present

## 2013-08-02 DIAGNOSIS — L919 Hypertrophic disorder of the skin, unspecified: Secondary | ICD-10-CM | POA: Diagnosis not present

## 2013-08-02 DIAGNOSIS — L909 Atrophic disorder of skin, unspecified: Secondary | ICD-10-CM | POA: Diagnosis not present

## 2013-08-02 DIAGNOSIS — D1801 Hemangioma of skin and subcutaneous tissue: Secondary | ICD-10-CM | POA: Diagnosis not present

## 2013-08-02 DIAGNOSIS — D485 Neoplasm of uncertain behavior of skin: Secondary | ICD-10-CM | POA: Diagnosis not present

## 2013-08-02 DIAGNOSIS — L821 Other seborrheic keratosis: Secondary | ICD-10-CM | POA: Diagnosis not present

## 2013-08-02 DIAGNOSIS — B351 Tinea unguium: Secondary | ICD-10-CM | POA: Diagnosis not present

## 2013-08-02 DIAGNOSIS — L819 Disorder of pigmentation, unspecified: Secondary | ICD-10-CM | POA: Diagnosis not present

## 2013-08-09 ENCOUNTER — Encounter: Payer: Self-pay | Admitting: Family Medicine

## 2013-08-22 DIAGNOSIS — C44221 Squamous cell carcinoma of skin of unspecified ear and external auricular canal: Secondary | ICD-10-CM | POA: Diagnosis not present

## 2013-10-14 DIAGNOSIS — H903 Sensorineural hearing loss, bilateral: Secondary | ICD-10-CM | POA: Diagnosis not present

## 2013-10-14 DIAGNOSIS — H905 Unspecified sensorineural hearing loss: Secondary | ICD-10-CM | POA: Diagnosis not present

## 2013-11-09 DIAGNOSIS — H905 Unspecified sensorineural hearing loss: Secondary | ICD-10-CM | POA: Diagnosis not present

## 2013-11-09 DIAGNOSIS — H903 Sensorineural hearing loss, bilateral: Secondary | ICD-10-CM | POA: Diagnosis not present

## 2013-11-11 ENCOUNTER — Ambulatory Visit
Admission: RE | Admit: 2013-11-11 | Discharge: 2013-11-11 | Disposition: A | Discharge status: Home / Self Care | Payer: Medicare Other | Source: Ambulatory Visit | Attending: Otolaryngology | Admitting: Otolaryngology

## 2013-11-11 ENCOUNTER — Other Ambulatory Visit: Payer: Self-pay | Admitting: Otolaryngology

## 2013-11-11 DIAGNOSIS — H903 Sensorineural hearing loss, bilateral: Secondary | ICD-10-CM

## 2013-11-11 DIAGNOSIS — H905 Unspecified sensorineural hearing loss: Secondary | ICD-10-CM | POA: Diagnosis not present

## 2013-11-11 MED ORDER — GADOBENATE DIMEGLUMINE 529 MG/ML IV SOLN
15.0000 mL | Freq: Once | INTRAVENOUS | Status: AC | PRN
  Administered 2013-11-11: 15 mL via INTRAVENOUS

## 2013-11-21 ENCOUNTER — Ambulatory Visit (INDEPENDENT_AMBULATORY_CARE_PROVIDER_SITE_OTHER): Payer: Medicare Other | Admitting: Internal Medicine

## 2013-11-21 ENCOUNTER — Other Ambulatory Visit: Payer: Medicare Other

## 2013-11-21 ENCOUNTER — Encounter: Payer: Self-pay | Admitting: Internal Medicine

## 2013-11-21 VITALS — BP 128/86 | HR 71 | Ht 69.0 in | Wt 153.4 lb

## 2013-11-21 DIAGNOSIS — I080 Rheumatic disorders of both mitral and aortic valves: Secondary | ICD-10-CM

## 2013-11-21 DIAGNOSIS — I519 Heart disease, unspecified: Secondary | ICD-10-CM

## 2013-11-21 DIAGNOSIS — I4949 Other premature depolarization: Secondary | ICD-10-CM

## 2013-11-21 DIAGNOSIS — I739 Peripheral vascular disease, unspecified: Secondary | ICD-10-CM

## 2013-11-21 NOTE — Progress Notes (Signed)
PCP: Ria Bush, MD  The patient presents today for routine cardiology followup. He reports doing very well since last being seen in our clinic.  His claudication is stable with pletal.   The patient denies symptoms of palpitations, chest pain, shortness of breath, orthopnea, PND, lower extremity edema, presyncope, syncope, or neurologic sequela.  He reports occasional postural dizziness. The patient is tolerating medications without difficulties and is otherwise without complaint today.    Past Medical History  Diagnosis Date  . Allergic rhinitis   . Diverticulosis of colon     conoscopy 12/1993 (Dr. Amedeo Plenty) S/G divertics//colonoscopy L&R divertics (Dr. Amedeo Plenty) 11/08/2004  . PVC (premature ventricular contraction)   . Bradycardia   . Gout   . HTN (hypertension)   . PVD (peripheral vascular disease)     followed by Dr Trula Slade, treating medically (pletal and aspirin)  . Mitral regurgitation     Pulmonic regurgitation, with marked LA enlargement  . CAD (coronary artery disease)     nonobstructive  . Nonischemic cardiomyopathy     EF 40%  . Arthritis     Gout  . BPH (benign prostatic hypertrophy)   . Chronic anemia 2013    h/o IDA, did not tolerate oral iron, latest normal iron studies, B12, folate  . RBBB longstanding  . Squamous cell carcinoma in situ of skin 2015    L ear   Past Surgical History  Procedure Laterality Date  . Appendectomy    . Dexa (secondary shrunk 2") wnl 07/06/03    . 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 Ascorbic Acid (VITAMIN C PO) Take 1 tablet by mouth 2 (two) times daily.       Marland Kitchen aspirin 325 MG tablet Take 325 mg by mouth daily.        Marland Kitchen CALCIUM-VITAMIN D PO Take 1,200 mg by mouth daily.       . cilostazol (PLETAL) 100 MG tablet take 1 tablet by mouth twice a day  60 tablet  6  . psyllium (FIBER THERAPY) 0.52 G capsule Take 2 capsules by mouth twice a day       No  current facility-administered medications for this visit.    No Known Allergies  History   Social History  . Marital Status: Married    Spouse Name: N/A    Number of Children: N/A  . Years of Education: N/A   Occupational History  . Not on file.   Social History Main Topics  . Smoking status: Former Smoker -- 2.00 packs/day    Types: Cigarettes    Quit date: 11/10/1982  . Smokeless tobacco: Former Systems developer  . Alcohol Use: 8.4 oz/week    10 Glasses of wine, 4 Cans of beer per week     Comment: daily  . Drug Use: No  . Sexual Activity: No   Other Topics Concern  . Not on file   Social History Narrative   Activity: goes to gym, works for [employer]   Diet: healthy, leafy greens, good meat intake weekly, good water      Advanced directives in chart (10/2012).  Does not want artificial nutrition/hydration or prolonged life support    Family History  Problem Relation Age of Onset  . Hypertension    . Heart disease Mother 32    Natural causes with CHF  . Hypertension Mother   . Hypertension Father   . Stroke Father  cerebral hemorrhage  . Hyperlipidemia Brother   . Heart disease Brother     before age 65  . Hypertension Brother   . Heart attack Brother      Physical Exam: Filed Vitals:   11/21/13 1026  BP: 128/86  Pulse: 71  Height: 5\' 9"  (1.753 m)  Weight: 153 lb 6.4 oz (69.582 kg)    GEN- The patient is well appearing, alert and oriented x 3 today.   Head- normocephalic, atraumatic Eyes-  Sclera clear, conjunctiva pink Ears- hearing intact Oropharynx- clear Neck- supple, no JVP Lymph- no cervical lymphadenopathy Lungs- Clear to ausculation bilaterally, normal work of breathing Heart- Regular rate and rhythm, no murmurs, rubs or gallops, PMI not laterally displaced GI- soft, NT, ND, + BS Extremities- no clubbing, cyanosis, or edema, extremities warm and cool  ekg today reveals sinus rhythm 71 bpm, PR 170, QRS 148, RBBB, rare PVCs Echo 2012  is reviewed Epic records are reviewed  Assessment and Plan:  1. PVCs Asymptomatic No changes  2. Nonischemic CM NYHA Class I/II Repeat echo (last echo 2012) Low BP limits our ability to treat  3. Postural dizziness Adequate hydration and salt liberalization encouraged  4. PAD Follows with Dr Trula Slade  5. Health maintenance He is due for Lipids.  States that he would want PCP to do with physical at next appointment  Return to see me in 1 year

## 2013-11-21 NOTE — Patient Instructions (Signed)
Your physician wants you to follow-up in: 12 months with Dr Vallery Ridge will receive a reminder letter in the mail two months in advance. If you don't receive a letter, please call our office to schedule the follow-up appointment.   Your physician has requested that you have an echocardiogram. Echocardiography is a painless test that uses sound waves to create images of your heart. It provides your doctor with information about the size and shape of your heart and how well your heart's chambers and valves are working. This procedure takes approximately one hour. There are no restrictions for this procedure.  Increase Salt and keep hydrated

## 2013-11-25 ENCOUNTER — Other Ambulatory Visit (HOSPITAL_COMMUNITY): Payer: Medicare Other

## 2013-12-02 ENCOUNTER — Ambulatory Visit (HOSPITAL_COMMUNITY): Payer: Medicare Other | Attending: Cardiology | Admitting: Radiology

## 2013-12-02 DIAGNOSIS — I359 Nonrheumatic aortic valve disorder, unspecified: Secondary | ICD-10-CM | POA: Insufficient documentation

## 2013-12-02 DIAGNOSIS — I079 Rheumatic tricuspid valve disease, unspecified: Secondary | ICD-10-CM | POA: Insufficient documentation

## 2013-12-02 DIAGNOSIS — I428 Other cardiomyopathies: Secondary | ICD-10-CM

## 2013-12-02 DIAGNOSIS — I739 Peripheral vascular disease, unspecified: Secondary | ICD-10-CM | POA: Insufficient documentation

## 2013-12-02 DIAGNOSIS — I4510 Unspecified right bundle-branch block: Secondary | ICD-10-CM | POA: Insufficient documentation

## 2013-12-02 DIAGNOSIS — I4949 Other premature depolarization: Secondary | ICD-10-CM | POA: Diagnosis not present

## 2013-12-02 DIAGNOSIS — I519 Heart disease, unspecified: Secondary | ICD-10-CM

## 2013-12-02 DIAGNOSIS — Z87891 Personal history of nicotine dependence: Secondary | ICD-10-CM | POA: Diagnosis not present

## 2013-12-02 DIAGNOSIS — I379 Nonrheumatic pulmonary valve disorder, unspecified: Secondary | ICD-10-CM | POA: Diagnosis not present

## 2013-12-02 DIAGNOSIS — R002 Palpitations: Secondary | ICD-10-CM | POA: Diagnosis not present

## 2013-12-02 DIAGNOSIS — I059 Rheumatic mitral valve disease, unspecified: Secondary | ICD-10-CM | POA: Diagnosis not present

## 2013-12-02 DIAGNOSIS — I517 Cardiomegaly: Secondary | ICD-10-CM | POA: Insufficient documentation

## 2013-12-02 DIAGNOSIS — I2510 Atherosclerotic heart disease of native coronary artery without angina pectoris: Secondary | ICD-10-CM | POA: Diagnosis not present

## 2013-12-02 DIAGNOSIS — R42 Dizziness and giddiness: Secondary | ICD-10-CM | POA: Insufficient documentation

## 2013-12-02 NOTE — Progress Notes (Signed)
Echocardiogram performed.  

## 2013-12-04 ENCOUNTER — Other Ambulatory Visit: Payer: Self-pay | Admitting: Family Medicine

## 2013-12-04 DIAGNOSIS — R634 Abnormal weight loss: Secondary | ICD-10-CM

## 2013-12-04 DIAGNOSIS — Z125 Encounter for screening for malignant neoplasm of prostate: Secondary | ICD-10-CM

## 2013-12-04 DIAGNOSIS — I519 Heart disease, unspecified: Secondary | ICD-10-CM

## 2013-12-05 ENCOUNTER — Other Ambulatory Visit (INDEPENDENT_AMBULATORY_CARE_PROVIDER_SITE_OTHER): Payer: Medicare Other

## 2013-12-05 DIAGNOSIS — I519 Heart disease, unspecified: Secondary | ICD-10-CM

## 2013-12-05 DIAGNOSIS — Z125 Encounter for screening for malignant neoplasm of prostate: Secondary | ICD-10-CM | POA: Diagnosis not present

## 2013-12-05 DIAGNOSIS — R634 Abnormal weight loss: Secondary | ICD-10-CM

## 2013-12-05 LAB — BASIC METABOLIC PANEL
BUN: 22 mg/dL (ref 6–23)
CALCIUM: 8.8 mg/dL (ref 8.4–10.5)
CO2: 27 mEq/L (ref 19–32)
CREATININE: 1.2 mg/dL (ref 0.4–1.5)
Chloride: 106 mEq/L (ref 96–112)
GFR: 61.71 mL/min (ref >60.00)
Glucose, Bld: 81 mg/dL (ref 70–99)
Potassium: 4.8 mEq/L (ref 3.5–5.1)
Sodium: 138 mEq/L (ref 135–145)

## 2013-12-05 LAB — CBC WITH DIFFERENTIAL/PLATELET
BASOS ABS: 0 10*3/uL (ref 0.0–0.1)
Basophils Relative: 0.2 % (ref 0.0–3.0)
Eosinophils Absolute: 0.2 10*3/uL (ref 0.0–0.7)
Eosinophils Relative: 4.4 % (ref 0.0–5.0)
HCT: 36.5 % — ABNORMAL LOW (ref 39.0–52.0)
Hemoglobin: 12.2 g/dL — ABNORMAL LOW (ref 13.0–17.0)
LYMPHS PCT: 22.2 % (ref 12.0–46.0)
Lymphs Abs: 1.1 10*3/uL (ref 0.7–4.0)
MCHC: 33.5 g/dL (ref 30.0–36.0)
MCV: 95.3 fl (ref 78.0–100.0)
MONOS PCT: 10.5 % (ref 3.0–12.0)
Monocytes Absolute: 0.5 10*3/uL (ref 0.1–1.0)
Neutro Abs: 3.1 10*3/uL (ref 1.4–7.7)
Neutrophils Relative %: 62.7 % (ref 43.0–77.0)
PLATELETS: 260 10*3/uL (ref 150.0–400.0)
RBC: 3.83 Mil/uL — ABNORMAL LOW (ref 4.22–5.81)
RDW: 13.3 % (ref 11.5–15.5)
WBC: 4.9 10*3/uL (ref 4.0–10.5)

## 2013-12-05 LAB — LIPID PANEL
Cholesterol: 150 mg/dL (ref 0–200)
HDL: 62.3 mg/dL (ref >39.00)
LDL CALC: 84 mg/dL (ref 0–99)
NONHDL: 87.7
Total CHOL/HDL Ratio: 2
Triglycerides: 17 mg/dL (ref 0.0–149.0)
VLDL: 3.4 mg/dL (ref 0.0–40.0)

## 2013-12-05 LAB — TSH: TSH: 2.35 u[IU]/mL (ref 0.35–4.50)

## 2013-12-05 LAB — PSA, MEDICARE: PSA: 1.38 ng/mL (ref 0.10–4.00)

## 2013-12-12 ENCOUNTER — Ambulatory Visit (INDEPENDENT_AMBULATORY_CARE_PROVIDER_SITE_OTHER): Payer: Medicare Other | Admitting: Family Medicine

## 2013-12-12 ENCOUNTER — Encounter: Payer: Self-pay | Admitting: Family Medicine

## 2013-12-12 VITALS — BP 110/60 | HR 75 | Temp 97.5°F | Ht 67.5 in | Wt 151.2 lb

## 2013-12-12 DIAGNOSIS — Z1211 Encounter for screening for malignant neoplasm of colon: Secondary | ICD-10-CM

## 2013-12-12 DIAGNOSIS — Z7189 Other specified counseling: Secondary | ICD-10-CM | POA: Insufficient documentation

## 2013-12-12 DIAGNOSIS — R42 Dizziness and giddiness: Secondary | ICD-10-CM

## 2013-12-12 DIAGNOSIS — Z23 Encounter for immunization: Secondary | ICD-10-CM

## 2013-12-12 DIAGNOSIS — H903 Sensorineural hearing loss, bilateral: Secondary | ICD-10-CM | POA: Diagnosis not present

## 2013-12-12 DIAGNOSIS — R634 Abnormal weight loss: Secondary | ICD-10-CM

## 2013-12-12 DIAGNOSIS — Z0000 Encounter for general adult medical examination without abnormal findings: Secondary | ICD-10-CM

## 2013-12-12 DIAGNOSIS — D509 Iron deficiency anemia, unspecified: Secondary | ICD-10-CM

## 2013-12-12 DIAGNOSIS — H905 Unspecified sensorineural hearing loss: Secondary | ICD-10-CM | POA: Diagnosis not present

## 2013-12-12 DIAGNOSIS — I739 Peripheral vascular disease, unspecified: Secondary | ICD-10-CM

## 2013-12-12 NOTE — Assessment & Plan Note (Signed)
Continue aspirin ,pletal, compression stockings.

## 2013-12-12 NOTE — Progress Notes (Signed)
BP 110/60  Pulse 75  Temp(Src) 97.5 F (36.4 C) (Oral)  Ht 5' 7.5" (1.715 m)  Wt 151 lb 4 oz (68.607 kg)  BMI 23.33 kg/m2  SpO2 94%   CC: medicare wellness visit Subjective:    Patient ID: Adam Clements, male    DOB: November 29, 1934, 79 y.o.   MRN: [id number]  HPI: Adam Clements is a 79 y.o. male presenting on 12/12/2013 for Annual Exam and Dizziness   Wt Readings from Last 3 Encounters:  12/12/13 151 lb 4 oz (68.607 kg)  11/21/13 153 lb 6.4 oz (69.582 kg)  06/03/13 161 lb (73.029 kg)   Body mass index is 23.33 kg/(m^2). Weight slowly creeping down - attributes to increase in outdoor work over summer.  After workout, blood pressure drops. Works out for 1 hour - 15 min walking, 30 min stretching, 15 min weight lifting. Some dizziness with this.  H/o claudication - on compression stocking which have helped, also on aspiirn 325 and cilostazol.  Passes vision screen. Wears hearing aides.  Denies depression/sadness, anhedonia or falls in the past year.   Preventative: Colonoscopy - 2006 by Dr. Amedeo Plenty - R and L sided diverticulosis, rec rpt in 10 yrs. Saw GI last year 2/2 weight loss and IDA. Decided to do yearly hemoccults until next colonoscopy 2016, continue slow release iron daily.  Prostate screening - normal PSA last year, normal this year. H/o BPH. Nocturia x1. Strong stream. Desires this year as last screening. DEXA 2004 WNL.  Flu shot - today Pneumovax 2004, prevnar today Td 2012 zostavax 2009.  Advanced directives: has living will at home -done through attorney. HCPOA is wife then son and then oldest daughter etc. Advanced directives in chart 2012/11/14). Does not want artificial nutrition/hydration or prolonged life support  Lives with wife [name]). Dog passed away 2012/11/14. 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  Allergies and medications reviewed and updated. Current Outpatient Prescriptions on File Prior to Visit  Medication Sig  . Ascorbic Acid (VITAMIN C PO) Take 1 tablet by mouth 2 (two) times daily.   Marland Kitchen aspirin 325 MG tablet Take 325 mg by mouth daily.    Marland Kitchen CALCIUM-VITAMIN D PO Take 1,200 mg by mouth daily.   . cilostazol (PLETAL) 100 MG tablet take 1 tablet by mouth twice a day  . psyllium (FIBER THERAPY) 0.52 G capsule Take 2 capsules by mouth twice a day   No current facility-administered medications on file prior to visit.    Review of Systems Per HPI unless specifically indicated above    Objective:    BP 110/60  Pulse 75  Temp(Src) 97.5 F (36.4 C) (Oral)  Ht 5' 7.5" (1.715 m)  Wt 151 lb 4 oz (68.607 kg)  BMI 23.33 kg/m2  SpO2 94%  Physical Exam  Nursing note and vitals reviewed. Constitutional: He is oriented to person, place, and time. He appears well-developed and well-nourished. No distress.  HENT:  Head: Normocephalic and atraumatic.  Right Ear: Hearing, tympanic membrane, external ear and ear canal normal.  Left Ear: Hearing, tympanic membrane, external ear and ear canal normal.  Nose: Nose normal.  Mouth/Throat: Uvula is midline, oropharynx is clear and moist and mucous membranes are normal. No oropharyngeal exudate, posterior oropharyngeal edema or posterior oropharyngeal  erythema.  Eyes: Conjunctivae and EOM are normal. Pupils are equal, round, and reactive to light. No scleral icterus.  Neck: Normal range of motion. Neck supple. Carotid bruit is not present. No thyromegaly present.  Cardiovascular: Normal rate, regular rhythm, normal heart sounds and intact distal pulses.   No murmur heard. Pulses:      Radial pulses are 2+ on the right side, and 2+ on the left side.  Pulmonary/Chest: Effort normal and breath sounds normal. No respiratory distress. He has no  wheezes. He has no rales.  Abdominal: Soft. Bowel sounds are normal. He exhibits no distension and no mass. There is no tenderness. There is no rebound and no guarding.  Genitourinary: Rectum normal and prostate normal. Rectal exam shows no external hemorrhoid, no internal hemorrhoid, no fissure, no mass, no tenderness and anal tone normal. Prostate is not enlarged (20gm) and not tender.  Musculoskeletal: Normal range of motion. He exhibits no edema.  Lymphadenopathy:    He has no cervical adenopathy.  Neurological: He is alert and oriented to person, place, and time.  CN grossly intact, station and gait intact Recall 3/3  Calculation 4/5 serial 7s  Skin: Skin is warm and dry. No rash noted.  Psychiatric: He has a normal mood and affect. His behavior is normal. Judgment and thought content normal.   Results for orders placed in visit on 12/05/13  LIPID PANEL      Result Value Ref Range   Cholesterol 150  0 - 200 mg/dL   Triglycerides 17.0  0.0 - 149.0 mg/dL   HDL 62.30  >39.00 mg/dL   VLDL 3.4  0.0 - 40.0 mg/dL   LDL Cholesterol 84  0 - 99 mg/dL   Total CHOL/HDL Ratio 2     NonHDL 46.56    BASIC METABOLIC PANEL      Result Value Ref Range   Sodium 138  135 - 145 mEq/L   Potassium 4.8  3.5 - 5.1 mEq/L   Chloride 106  96 - 112 mEq/L   CO2 27  19 - 32 mEq/L   Glucose, Bld 81  70 - 99 mg/dL   BUN 22  6 - 23 mg/dL   Creatinine, Ser 1.2  0.4 - 1.5 mg/dL   Calcium 8.8  8.4 - 10.5 mg/dL   GFR 61.71  >60.00 mL/min  PSA, MEDICARE      Result Value Ref Range   PSA 1.38  0.10 - 4.00 ng/ml  TSH      Result Value Ref Range   TSH 2.35  0.35 - 4.50 uIU/mL  CBC WITH DIFFERENTIAL      Result Value Ref Range   WBC 4.9  4.0 - 10.5 K/uL   RBC 3.83 (*) 4.22 - 5.81 Mil/uL   Hemoglobin 12.2 (*) 13.0 - 17.0 g/dL   HCT 36.5 (*) 39.0 - 52.0 %   MCV 95.3  78.0 - 100.0 fl   MCHC 33.5  30.0 - 36.0 g/dL   RDW 13.3  11.5 - 15.5 %   Platelets 260.0  150.0 - 400.0 K/uL   Neutrophils Relative % 62.7   43.0 - 77.0 %   Lymphocytes Relative 22.2  12.0 - 46.0 %   Monocytes Relative 10.5  3.0 - 12.0 %   Eosinophils Relative 4.4  0.0 - 5.0 %   Basophils Relative 0.2  0.0 - 3.0 %   Neutro Abs 3.1  1.4 - 7.7 K/uL   Lymphs Abs 1.1  0.7 - 4.0 K/uL  Monocytes Absolute 0.5  0.1 - 1.0 K/uL   Eosinophils Absolute 0.2  0.0 - 0.7 K/uL   Basophils Absolute 0.0  0.0 - 0.1 K/uL      Assessment & Plan:   Problem List Items Addressed This Visit   Weight loss     Overall stable. Continue to monitor. Advised notify me if persistent weight loss.    PAD (peripheral artery disease)     Continue aspirin ,pletal, compression stockings.    Orthostatic dizziness     Discussed liberalizing sodium intake. Continue compression stockings.    Medicare annual wellness visit, subsequent - Primary     I have personally reviewed the Medicare Annual Wellness questionnaire and have noted 1. The patient's medical and social history 2. Their use of alcohol, tobacco or illicit drugs 3. Their current medications and supplements 4. The patient's functional ability including ADL's, fall risks, home safety risks and hearing or visual impairment. 5. Diet and physical activity 6. Evidence for depression or mood disorders The patients weight, height, BMI have been recorded in the chart.  Hearing and vision has been addressed. I have made referrals, counseling and provided education to the patient based review of the above and I have provided the pt with a written personalized care plan for preventive services. Provider list updated - see scanned questionairre.  Reviewed preventative protocols and updated unless pt declined.    IDA (iron deficiency anemia)     Stable. Did not tolerate oral iron. Check iFOB today.    Advanced care planning/counseling discussion     Advanced directives: has living will at home -done through attorney. HCPOA is wife then son and then oldest daughter etc. Advanced directives in chart (10/2012).  Does not want artificial nutrition/hydration or prolonged life support     Other Visit Diagnoses   Special screening for malignant neoplasms, colon        Relevant Orders       Fecal occult blood, imunochemical        Follow up plan: Return in about 1 year (around 12/13/2014), or as needed, for medicare wellness.

## 2013-12-12 NOTE — Assessment & Plan Note (Signed)
Stable. Did not tolerate oral iron. Check iFOB today.

## 2013-12-12 NOTE — Assessment & Plan Note (Signed)
Discussed liberalizing sodium intake. Continue compression stockings.

## 2013-12-12 NOTE — Addendum Note (Signed)
Addended by: Modena Nunnery on: 12/12/2013 12:38 PM   Modules accepted: Orders

## 2013-12-12 NOTE — Progress Notes (Signed)
Pre visit review using our clinic review tool, if applicable. No additional management support is needed unless otherwise documented below in the visit note. 

## 2013-12-12 NOTE — Patient Instructions (Signed)
Flu shot and pneumonia shot today. Pass by lab for a stool kit. You can be a bit more liberal with salt intake to help your low blood pressures. Let's keep an eye on weight -and let me know or return if weight continues dropping instead of weight gain expected. Return as needed or in 1 year for next physical. Good to se you today, call us with questions. 

## 2013-12-12 NOTE — Assessment & Plan Note (Signed)
Advanced directives: has living will at home -done through attorney. HCPOA is wife then son and then oldest daughter etc. Advanced directives in chart (10/2012). Does not want artificial nutrition/hydration or prolonged life support

## 2013-12-12 NOTE — Assessment & Plan Note (Signed)
Overall stable. Continue to monitor. Advised notify me if persistent weight loss.

## 2013-12-12 NOTE — Assessment & Plan Note (Signed)

## 2013-12-25 ENCOUNTER — Other Ambulatory Visit: Payer: Self-pay | Admitting: Family Medicine

## 2013-12-26 ENCOUNTER — Other Ambulatory Visit: Payer: Medicare Other

## 2013-12-26 DIAGNOSIS — Z1211 Encounter for screening for malignant neoplasm of colon: Secondary | ICD-10-CM

## 2013-12-26 LAB — FECAL OCCULT BLOOD, GUAIAC: Fecal Occult Blood: NEGATIVE

## 2013-12-26 LAB — FECAL OCCULT BLOOD, IMMUNOCHEMICAL: FECAL OCCULT BLD: NEGATIVE

## 2013-12-27 ENCOUNTER — Telehealth: Payer: Self-pay | Admitting: Family Medicine

## 2013-12-27 ENCOUNTER — Encounter (HOSPITAL_COMMUNITY): Payer: Self-pay | Admitting: Emergency Medicine

## 2013-12-27 ENCOUNTER — Encounter: Payer: Self-pay | Admitting: *Deleted

## 2013-12-27 ENCOUNTER — Emergency Department (HOSPITAL_COMMUNITY)
Admission: EM | Admit: 2013-12-27 | Discharge: 2013-12-27 | Disposition: A | Discharge status: Home / Self Care | Payer: Medicare Other | Attending: Emergency Medicine | Admitting: Emergency Medicine

## 2013-12-27 ENCOUNTER — Emergency Department (HOSPITAL_COMMUNITY): Payer: Medicare Other

## 2013-12-27 ENCOUNTER — Encounter: Payer: Self-pay | Admitting: Family Medicine

## 2013-12-27 ENCOUNTER — Ambulatory Visit (INDEPENDENT_AMBULATORY_CARE_PROVIDER_SITE_OTHER): Payer: Medicare Other | Admitting: Family Medicine

## 2013-12-27 VITALS — BP 140/76 | HR 44 | Temp 97.4°F | Wt 150.8 lb

## 2013-12-27 DIAGNOSIS — Z7982 Long term (current) use of aspirin: Secondary | ICD-10-CM | POA: Insufficient documentation

## 2013-12-27 DIAGNOSIS — I951 Orthostatic hypotension: Secondary | ICD-10-CM | POA: Diagnosis not present

## 2013-12-27 DIAGNOSIS — I10 Essential (primary) hypertension: Secondary | ICD-10-CM | POA: Insufficient documentation

## 2013-12-27 DIAGNOSIS — Z85828 Personal history of other malignant neoplasm of skin: Secondary | ICD-10-CM | POA: Diagnosis not present

## 2013-12-27 DIAGNOSIS — Z8719 Personal history of other diseases of the digestive system: Secondary | ICD-10-CM | POA: Insufficient documentation

## 2013-12-27 DIAGNOSIS — Z862 Personal history of diseases of the blood and blood-forming organs and certain disorders involving the immune mechanism: Secondary | ICD-10-CM | POA: Diagnosis not present

## 2013-12-27 DIAGNOSIS — R401 Stupor: Secondary | ICD-10-CM | POA: Diagnosis not present

## 2013-12-27 DIAGNOSIS — M1990 Unspecified osteoarthritis, unspecified site: Secondary | ICD-10-CM | POA: Diagnosis not present

## 2013-12-27 DIAGNOSIS — Z79899 Other long term (current) drug therapy: Secondary | ICD-10-CM | POA: Diagnosis not present

## 2013-12-27 DIAGNOSIS — I2510 Atherosclerotic heart disease of native coronary artery without angina pectoris: Secondary | ICD-10-CM | POA: Insufficient documentation

## 2013-12-27 DIAGNOSIS — R55 Syncope and collapse: Secondary | ICD-10-CM | POA: Diagnosis not present

## 2013-12-27 DIAGNOSIS — R400 Somnolence: Secondary | ICD-10-CM | POA: Diagnosis present

## 2013-12-27 DIAGNOSIS — R42 Dizziness and giddiness: Secondary | ICD-10-CM | POA: Diagnosis not present

## 2013-12-27 LAB — COMPREHENSIVE METABOLIC PANEL
ALT: 13 U/L (ref 0–53)
AST: 22 U/L (ref 0–37)
Albumin: 3.3 g/dL — ABNORMAL LOW (ref 3.5–5.2)
Alkaline Phosphatase: 56 U/L (ref 39–117)
Anion gap: 10 (ref 5–15)
BUN: 24 mg/dL — ABNORMAL HIGH (ref 6–23)
CALCIUM: 8.7 mg/dL (ref 8.4–10.5)
CO2: 26 mEq/L (ref 19–32)
Chloride: 100 mEq/L (ref 96–112)
Creatinine, Ser: 1.29 mg/dL (ref 0.50–1.35)
GFR calc non Af Amer: 52 mL/min — ABNORMAL LOW (ref >90)
GFR, EST AFRICAN AMERICAN: 60 mL/min — AB (ref >90)
GLUCOSE: 112 mg/dL — AB (ref 70–99)
Potassium: 3.7 mEq/L (ref 3.7–5.3)
Sodium: 136 mEq/L — ABNORMAL LOW (ref 137–147)
TOTAL PROTEIN: 5.9 g/dL — AB (ref 6.0–8.3)
Total Bilirubin: 0.5 mg/dL (ref 0.3–1.2)

## 2013-12-27 LAB — URINALYSIS, ROUTINE W REFLEX MICROSCOPIC
Bilirubin Urine: NEGATIVE
Glucose, UA: NEGATIVE mg/dL
Hgb urine dipstick: NEGATIVE
Ketones, ur: NEGATIVE mg/dL
LEUKOCYTES UA: NEGATIVE
Nitrite: NEGATIVE
Protein, ur: NEGATIVE mg/dL
Specific Gravity, Urine: 1.008 (ref 1.005–1.030)
UROBILINOGEN UA: 0.2 mg/dL (ref 0.0–1.0)
pH: 5.5 (ref 5.0–8.0)

## 2013-12-27 LAB — CBC
HCT: 34.5 % — ABNORMAL LOW (ref 39.0–52.0)
Hemoglobin: 11.9 g/dL — ABNORMAL LOW (ref 13.0–17.0)
MCH: 32 pg (ref 26.0–34.0)
MCHC: 34.5 g/dL (ref 30.0–36.0)
MCV: 92.7 fL (ref 78.0–100.0)
PLATELETS: 252 10*3/uL (ref 150–400)
RBC: 3.72 MIL/uL — AB (ref 4.22–5.81)
RDW: 12.5 % (ref 11.5–15.5)
WBC: 5.4 10*3/uL (ref 4.0–10.5)

## 2013-12-27 LAB — TROPONIN I

## 2013-12-27 MED ORDER — SODIUM CHLORIDE 0.9 % IV BOLUS (SEPSIS)
1000.0000 mL | Freq: Once | INTRAVENOUS | Status: AC
  Administered 2013-12-27: 1000 mL via INTRAVENOUS

## 2013-12-27 NOTE — Assessment & Plan Note (Addendum)
In h/o orthostatic hypotension with + orthostatics today, but today's episode is different - associated with palpitations/bradycardia. EKG today - RBBB, PVCs with bigeminy, normal axis, stable intervals, no acute ST/T changes. Anticipate dehydration combined with orthostatic hypotension. Dr. Rayann Heman not available so I spoke with Dr. Radford Pax regarding patient. Given marked drop in BP she suggests eval in hospital today. I spoke with patient regarding this. We will call hospital to notify he is on his way.  If eval unrevealing - may consider midodrine.

## 2013-12-27 NOTE — Telephone Encounter (Signed)
Pt evaluated at ER, stable. Discharged last night. plz call on Wednesday and ask him to update [place] with dizziness and blood pressures over next 1-2 wks, sooner if any more passing out.

## 2013-12-27 NOTE — Progress Notes (Signed)
BP 140/76  Pulse 44  Temp(Src) 97.4 F (36.3 C) (Oral)  Wt 150 lb 12 oz (68.38 kg)   CC: LOC  Subjective:    Patient ID: Adam Clements, male    DOB: [birth year], 79 y.o.   MRN: [id number]  HPI: Adam Clements is a 79 y.o. male presenting on 12/27/2013 for Loss of Consciousness   Pleasant 79 yo patient of mine who presents with 1 syncopal episode in last 24 hours. Noticed increased voiding today. Woke up around 6:30am to void, after voiding felt presyncopal, so sat down on floor and passed out. This lasted ~30 sec, then felt better and got up, remained dizzy throughout the morning. Went on to do his regular barn chores this morning. Did have some upper back pain yesterday after cleaning barn. "Shoulders and lower back feel tense". + palpitations.  EMS evaluated him, thought he was dehydrated.  Denies dysuria or urgency or hematuria, nausea, fevers. No dyspnea, no leg swelling. Denies diaphoresis. No chest pain/tightness. No tremors or imbalance.  Orthostatics today: supine - 140/77, HR 44, standing - 84/50, HR 56.  Known h/o orthostatic dizziness, uses compression stockings regularly and liberalizes salt intake. H/o claudication - on compression stocking which have helped, also on aspiirn 325 and cilostazol.  H/o NICM with last echo 11/2013 - EF 35-40%, diffuse hypokinesis, mild-mod MR, mild LA dilation. Known RBBB and mild bradycardia. Followed by Dr Rayann Heman.  Relevant past medical, surgical, family and social history reviewed and updated as indicated.  Allergies and medications reviewed and updated. Current Outpatient Prescriptions on File Prior to Visit  Medication Sig  . Ascorbic Acid (VITAMIN C PO) Take 1 tablet by mouth 2 (two) times daily.   Marland Kitchen aspirin 325 MG tablet Take 325 mg by mouth daily.    Marland Kitchen CALCIUM-VITAMIN D PO Take 1,200 mg by mouth daily.   . cilostazol (PLETAL) 100 MG tablet take 1 tablet by mouth twice a day  . psyllium (FIBER THERAPY) 0.52 G capsule Take 2  capsules by mouth twice a day   No current facility-administered medications on file prior to visit.   Past Medical History  Diagnosis Date  . Allergic rhinitis   . Diverticulosis of colon     conoscopy 12/1993 (Dr. Amedeo Plenty) S/G divertics//colonoscopy L&R divertics (Dr. Amedeo Plenty) 11/08/2004  . PVC (premature ventricular contraction)   . Bradycardia   . Gout   . HTN (hypertension)   . PVD (peripheral vascular disease)     followed by Dr Trula Slade, treating medically (pletal and aspirin)  . Mitral regurgitation     Pulmonic regurgitation, with marked LA enlargement  . CAD (coronary artery disease)     nonobstructive  . Nonischemic cardiomyopathy     EF 40%  . Arthritis     Gout  . BPH (benign prostatic hypertrophy)   . Chronic anemia 2013    h/o IDA, did not tolerate oral iron, latest normal iron studies, B12, folate  . RBBB longstanding  . Squamous cell carcinoma in situ of skin 2015    L ear    Review of Systems Per HPI unless specifically indicated above    Objective:    BP 140/76  Pulse 44  Temp(Src) 97.4 F (36.3 C) (Oral)  Wt 150 lb 12 oz (68.38 kg)  Physical Exam  Nursing note and vitals reviewed. Constitutional: He appears well-developed and well-nourished. No distress.  HENT:  Mouth/Throat: Oropharynx is clear and moist. No oropharyngeal exudate.  Eyes: Conjunctivae and EOM are normal.  Pupils are equal, round, and reactive to light. No scleral icterus.  Neck: Normal range of motion. Neck supple.  Cardiovascular: Normal heart sounds and intact distal pulses.  An irregularly irregular rhythm present. Bradycardia present.   No murmur heard. Pulmonary/Chest: Effort normal and breath sounds normal. No respiratory distress. He has no wheezes. He has no rales.  Musculoskeletal: He exhibits no edema.  Lymphadenopathy:    He has no cervical adenopathy.  Skin: Skin is warm and dry.  Psychiatric: He has a normal mood and affect.       Assessment & Plan:   Problem List  Items Addressed This Visit   Syncope - Primary     In h/o orthostatic hypotension with + orthostatics today, but today's episode is different - associated with palpitations/bradycardia. EKG today - RBBB, PVCs with bigeminy, normal axis, stable intervals, no acute ST/T changes. Anticipate dehydration combined with orthostatic hypotension. Dr. Rayann Heman not available so I spoke with Dr. Radford Pax regarding patient. Given marked drop in BP she suggests eval in hospital today. I spoke with patient regarding this. We will call hospital to notify he is on his way.  If eval unrevealing - may consider midodrine.    Relevant Orders      EKG 12-Lead (Completed)       Follow up plan: Return if symptoms worsen or fail to improve.

## 2013-12-27 NOTE — ED Notes (Signed)
Notified EDP pts ortho vs

## 2013-12-27 NOTE — Progress Notes (Signed)
Pre visit review using our clinic review tool, if applicable. No additional management support is needed unless otherwise documented below in the visit note. 

## 2013-12-27 NOTE — Patient Instructions (Signed)
To ER at [facility] - we will call them to let them know you're coming.

## 2013-12-27 NOTE — Telephone Encounter (Signed)
Patient's wife called back and spoke to the front office staff and said that patient passed out again and that she called EMS and wanted patient to be seen sooner than [place] office staff advised me and I had them tell her to have patient go to ER for eval. Patient's wife verbalized understanding and said that they would go right away.

## 2013-12-27 NOTE — ED Notes (Signed)
Presents with syncopal episode that occurred at 6 am today, pt endorses lightheadedness most of the day-denies pain, reports he knew that he was about to pass so he layed down. Dr. Milon Dikes sent him du to hypotension. Pt is hypotensive at this time in the 90s and still feels light headed.

## 2013-12-27 NOTE — Telephone Encounter (Signed)
Noted. Agree. H/o orthostatic hypotension/dizziness but with new sxs recommend ER evaluation.

## 2013-12-27 NOTE — Telephone Encounter (Signed)
Patient Information:  Caller Name: Adam Clements  Phone: [phone]  Patient: Adam, Clements  Gender: Male  DOB: June 24, 1934  Age: 79 Years  PCP: Ria Bush [facility])  Office Follow Up:  Does the office need to follow up with this patient?: Yes  Instructions For The Office: probably a good idea to have re-inforcement for symptoms, necessity for care now  RN Note:  Patient declining 911 so spoke with wife about symptoms, tried to have her convince him to call 911, says she agrees.  Symptoms  Reason For Call & Symptoms: "not feeling like self at all today 10/6".  Felt going to pass out about 6:30 am so got to bathroom floor and did pass out.  About 30 seconds later getting up, feeling okay so stood up.  Still has been very lightheaded.  Trying to do barn chores and had to stop several times due to feeling cold, clammy.  Noting pulse very irregular 58-59 when usually 70-80.  Noted lower back and shoulder pain at one time.  Still not feeling well but wanting to wait until 2 pm OV that he made to be seen.  Reviewed Health History In EMR: Yes  Reviewed Medications In EMR: Yes  Reviewed Allergies In EMR: Yes  Reviewed Surgeries / Procedures: Yes  Date of Onset of Symptoms: 12/27/2013  Guideline(s) Used:  Fainting  Disposition Per Guideline:   Call EMS 911 Now  Reason For Disposition Reached:   Still feels dizzy or lightheaded  Advice Given:  N/A  Patient Refused Recommendation:  Patient Refused Care Advice  Unsure if wife will be able to convince him to call 911.

## 2013-12-27 NOTE — ED Notes (Signed)
Dr Harrison at bedside

## 2013-12-27 NOTE — ED Provider Notes (Signed)
CSN: [id number]     Arrival date & time 12/27/13  1627 History   First MD Initiated Contact with Patient 12/27/13 1727     Chief Complaint  Patient presents with  . Loss of Consciousness   Patient is a 79 y.o. male presenting with syncope. The history is provided by the patient.  Loss of Consciousness Episode history:  Single Most recent episode:  Today Progression:  Waxing and waning Chronicity:  Recurrent Context: standing up   Witnessed: no   Relieved by:  Lying down Worsened by:  Posture Ineffective treatments:  None tried Associated symptoms: no chest pain, no confusion, no diaphoresis, no difficulty breathing, no fever, no headaches, no nausea, no shortness of breath, no vomiting and no weakness   Risk factors: coronary artery disease    79 year old Caucasian male with a history of bradycardia, CAD, nonischemic cardiomyopathy with an EF of 40% who presents after a syncopal event at home. Patient states that he has had micturition syncope in the past but that is not what happened today. Patient felt prodromal symptoms and sat down on the floor. While he laid down he states he passed out. He did not fall or hit his head. He denies bloody stools. He denies tachycardia. Patient states that he does occasionally have an abnormal heart rhythm.  Past Medical History  Diagnosis Date  . Allergic rhinitis   . Diverticulosis of colon     conoscopy 12/1993 (Dr. Amedeo Plenty) S/G divertics//colonoscopy L&R divertics (Dr. Amedeo Plenty) 11/08/2004  . PVC (premature ventricular contraction)   . Bradycardia   . Gout   . HTN (hypertension)   . PVD (peripheral vascular disease)     followed by Dr Trula Slade, treating medically (pletal and aspirin)  . Mitral regurgitation     Pulmonic regurgitation, with marked LA enlargement  . CAD (coronary artery disease)     nonobstructive  . Nonischemic cardiomyopathy     EF 40%  . Arthritis     Gout  . BPH (benign prostatic hypertrophy)   . Chronic anemia 2013   h/o IDA, did not tolerate oral iron, latest normal iron studies, B12, folate  . RBBB longstanding  . Squamous cell carcinoma in situ of skin 2015    L ear   Past Surgical History  Procedure Laterality Date  . Appendectomy    . Dexa  2004    "shrunk 2 inches", WNL  . Abd/pelvic ct horseshoe kidney 10/10/04     Family History  Problem Relation Age of Onset  . Hypertension    . Heart disease Mother 28    Natural causes with CHF  . Hypertension Mother   . Hypertension Father   . Stroke Father     cerebral hemorrhage  . Hyperlipidemia Brother   . Heart disease Brother     before age 50  . Hypertension Brother   . Heart attack Brother    History  Substance Use Topics  . Smoking status: Former Smoker -- 2.00 packs/day    Types: Cigarettes    Quit date: 11/10/1982  . Smokeless tobacco: Former Systems developer  . Alcohol Use: 8.4 oz/week    10 Glasses of wine, 4 Cans of beer per week     Comment: 1-2 drinks/day    Review of Systems  Constitutional: Negative for fever, chills and diaphoresis.  HENT: Negative for rhinorrhea and sore throat.   Eyes: Negative for visual disturbance.  Respiratory: Negative for cough and shortness of breath.   Cardiovascular: Positive for syncope. Negative  for chest pain.  Gastrointestinal: Negative for nausea, vomiting, abdominal pain, diarrhea and constipation.  Genitourinary: Negative for dysuria and hematuria.  Musculoskeletal: Negative for back pain and neck pain.  Skin: Negative for rash.  Neurological: Positive for light-headedness. Negative for syncope, speech difficulty, weakness and headaches.  Psychiatric/Behavioral: Negative for confusion.  All other systems reviewed and are negative.  Allergies  Review of patient's allergies indicates no known allergies.  Home Medications   Prior to Admission medications   Medication Sig Start Date End Date Taking? Authorizing Provider  Ascorbic Acid (VITAMIN C PO) Take 1 tablet by mouth 2 (two) times  daily.    Yes Historical Provider, MD  aspirin 325 MG tablet Take 325 mg by mouth daily.     Yes Historical Provider, MD  CALCIUM-VITAMIN D PO Take 1,200 mg by mouth daily.    Yes Historical Provider, MD  cilostazol (PLETAL) 100 MG tablet take 1 tablet by mouth twice a day 12/25/13  Yes Ria Bush, MD  psyllium (FIBER THERAPY) 0.52 G capsule Take 2 capsules by mouth twice a day   Yes Historical Provider, MD   BP 139/72  Pulse 67  Temp(Src) 97.8 F (36.6 C) (Oral)  Resp 14  SpO2 100% Physical Exam  Constitutional: He is oriented to person, place, and time. He appears well-developed and well-nourished. No distress.  Thin elderly male, NAD  HENT:  Head: Normocephalic and atraumatic.  Dry mucous membranes  Eyes: EOM are normal.  Neck: Neck supple. No JVD present.  Cardiovascular: Normal rate, regular rhythm, normal heart sounds and intact distal pulses.   Frequent PVCs  Pulmonary/Chest: Effort normal and breath sounds normal.  Abdominal: Soft. He exhibits no distension. There is no tenderness.  Musculoskeletal: Normal range of motion. He exhibits no edema.  Neurological: He is alert and oriented to person, place, and time. No cranial nerve deficit.  Skin: Skin is warm and dry.  Psychiatric: His behavior is normal.    ED Course  Procedures  None Labs Review Labs Reviewed  CBC - Abnormal; Notable for the following:    RBC 3.72 (*)    Hemoglobin 11.9 (*)    HCT 34.5 (*)    All other components within normal limits  COMPREHENSIVE METABOLIC PANEL - Abnormal; Notable for the following:    Sodium 136 (*)    Glucose, Bld 112 (*)    BUN 24 (*)    Total Protein 5.9 (*)    Albumin 3.3 (*)    GFR calc non Af Amer 52 (*)    GFR calc Af Amer 60 (*)    All other components within normal limits  TROPONIN I  URINALYSIS, ROUTINE W REFLEX MICROSCOPIC    Imaging Review Dg Chest 2 View  12/27/2013   CLINICAL DATA:  Loss of consciousness today.  EXAM: CHEST  2 VIEW  COMPARISON:   07/20/2008  FINDINGS: The heart size and mediastinal contours are within normal limits. Both lungs are clear. The visualized skeletal structures are unremarkable.  IMPRESSION: No active cardiopulmonary disease.   Electronically Signed   By: Rozetta Nunnery M.D.   On: 12/27/2013 19:36     EKG Interpretation   Date/Time:  Tuesday December 27 2013 16:56:58 EDT Ventricular Rate:  85 PR Interval:  164 QRS Duration: 150 QT Interval:  390 QTC Calculation: 464 R Axis:   89 Text Interpretation:  Sinus rhythm with Premature supraventricular  complexes Non-specific intra-ventricular conduction block Likely  repolarization abnormality seen in inferior leads Confirmed by HARRISON  MD, Shea Evans [phone]) on 12/27/2013 5:24:41 PM      MDM   Final diagnoses:  Orthostatic hypotension   79 year old Caucasian male with a history of PVCs, HTN, CAD, chronic anemia, mitral regurg who presents after syncopal episode at home. The patient appears clinically dehydrated with dry mucous membranes. He his EKG demonstrates sinus rhythm with a rate of 85 and PVCs. Patient also has episodes of PVCs on the monitor during my examination. QTC 464. No ST segment elevation or depression. Will obtain CBC, CMP, troponin, chest x-ray. Number saline bolus given. Will also obtain orthostatics. Orthostatic show hypertension but no tachycardia upon standing. She was given 1 L normal saline bolus and orthostatics were repeated with similar results. Suspect this was neurocardiogenic syncope as opposed to pure cardiogenic syncope. Patient had no prodromal symptoms upon standing. Feel patient is stable for discharge home with close followup. Return precautions given. Patient voices understanding and is agreeable with this plan.   Case discussed with Dr. Aline Brochure.   Gustavus Bryant, MD 12/27/13 2016

## 2013-12-28 NOTE — ED Provider Notes (Signed)
Medical screening examination/treatment/procedure(s) were conducted as a shared visit with resident physician and myself.  I personally evaluated the patient during the encounter.   EKG Interpretation  Date/Time:  Tuesday December 27 2013 16:56:58 EDT Ventricular Rate:  85 PR Interval:  164 QRS Duration: 150 QT Interval:  390 QTC Calculation: 464 R Axis:   89 Text Interpretation:  Sinus rhythm with Premature supraventricular complexes Non-specific intra-ventricular conduction block Likely repolarization abnormality seen in inferior leads Confirmed by Janaiya Beauchesne  MD, Markiah Janeway ([id number]) on 12/27/2013 5:24:41 PM      I interviewed and examined the patient. Lungs are CTAB. Cardiac exam wnl. Abdomen soft.  Pt w/ lightheadedness for mos now. Has had similar sx previously. Prodrome prior to episode today. Likely neurocardiogenic. Orthostatic per EMS. Pt would like to go home and I think this is reasonable. Will rec close f/u w/ pcp.   Pamella Pert, MD 12/28/13 1233

## 2013-12-28 NOTE — Telephone Encounter (Signed)
Patient notified and verbalized understanding. 

## 2014-01-24 ENCOUNTER — Encounter: Payer: Self-pay | Admitting: Family Medicine

## 2014-01-24 ENCOUNTER — Ambulatory Visit (INDEPENDENT_AMBULATORY_CARE_PROVIDER_SITE_OTHER): Payer: Medicare Other | Admitting: Family Medicine

## 2014-01-24 VITALS — BP 132/82 | HR 60 | Temp 97.6°F | Wt 153.2 lb

## 2014-01-24 DIAGNOSIS — I951 Orthostatic hypotension: Secondary | ICD-10-CM | POA: Diagnosis not present

## 2014-01-24 NOTE — Assessment & Plan Note (Addendum)
Clearly orthostatic hypotension based on history and log he brings. Some degree of autonomic instability but not necessarily consistent with a neurodegenerative disorder. Has already implemented several preventative measures without success (compressino stockings, pushing fluids for hydration status, small increases in salt intake). Discussed other nonpharmacological measures including elastic stockings to waist, raising head of bed 10 degrees, and avoiding exertion in hot environment. Also discussed pharmacological measures - pt requests continued trial at non pharmacological treatment. If this fails to control sxs, would consider trial of fludrocortisone. See pt instructions. I will also check with Dr. Rayann Heman about pletal use and possible orthostatic effect.

## 2014-01-24 NOTE — Progress Notes (Signed)
Pre visit review using our clinic review tool, if applicable. No additional management support is needed unless otherwise documented below in the visit note. 

## 2014-01-24 NOTE — Patient Instructions (Addendum)
I will check with Dr Rayann Heman about pletal. Try compression stockings to waist, keep head of bed elevated to 10-20 degrees, avoid exertion in warm weather, liberalize salt intake as well as ensure good hydration status, slowly stand with resting prior to ambulation.  If no improvement with these measures, we may discuss a daily medication to help prevent low blood pressures. Good to see you today, call us with questions.

## 2014-01-24 NOTE — Progress Notes (Signed)
BP 132/82 mmHg  Pulse 60  Temp(Src) 97.6 F (36.4 C) (Oral)  Wt 153 lb 4 oz (69.514 kg)   CC: ER f/u   Subjective:    Patient ID: Adam Clements, male    DOB: 1934/06/05, 79 y.o.   MRN: [id number]  HPI: Adam Clements is a 79 y.o. male presenting on 01/24/2014 for Follow-up   See prior note for details. Evaluated in office 1 mo ago with orthostatic syncope, found to have markedly low blood pressures so after consultation with cardiology sent to ER for further evaluation. Records reviewed. Hgb 11.9, BUN 24, Alb 3.3. IVF provided and discharged home. Thought neurocardiogenic reflex syncope.   He brings log of orthostatic blood pressures daily over the last month which clearly show orthostatic drops with sitting and standing.  Denies confusion, memory loss, paresthesias, weakness.  Known h/o orthostatic dizziness and symptomatic PVCs, uses compression stockings regularly and liberalizes salt intake. H/o claudication - on compression stocking which have helped, also on aspiirn 325 and cilostazol.  H/o NICM with last echo 11/2013 - EF 35-40%, diffuse hypokinesis, mild-mod MR, mild LA dilation. Known RBBB and mild bradycardia. Followed by Dr Rayann Heman.  Relevant past medical, surgical, family and social history reviewed and updated as indicated.  Allergies and medications reviewed and updated. Current Outpatient Prescriptions on File Prior to Visit  Medication Sig  . Ascorbic Acid (VITAMIN C PO) Take 1 tablet by mouth 2 (two) times daily.   Marland Kitchen aspirin 325 MG tablet Take 325 mg by mouth daily.    Marland Kitchen CALCIUM-VITAMIN D PO Take 1,200 mg by mouth daily.   . cilostazol (PLETAL) 100 MG tablet take 1 tablet by mouth twice a day  . psyllium (FIBER THERAPY) 0.52 G capsule Take 2 capsules by mouth twice a day   No current facility-administered medications on file prior to visit.    Review of Systems Per HPI unless specifically indicated above    Objective:    BP 132/82 mmHg  Pulse 60   Temp(Src) 97.6 F (36.4 C) (Oral)  Wt 153 lb 4 oz (69.514 kg)  Physical Exam  Constitutional: He is oriented to person, place, and time. He appears well-developed and well-nourished. No distress.  HENT:  Mouth/Throat: Oropharynx is clear and moist. No oropharyngeal exudate.  Eyes: Conjunctivae and EOM are normal. Pupils are equal, round, and reactive to light. No scleral icterus.  Neck: Normal range of motion. Neck supple. No thyromegaly present.  Cardiovascular: Normal rate, regular rhythm, normal heart sounds and intact distal pulses.   No murmur heard. Pulmonary/Chest: Effort normal and breath sounds normal. No respiratory distress. He has no wheezes. He has no rales.  Musculoskeletal: He exhibits no edema.  Lymphadenopathy:    He has no cervical adenopathy.  Neurological: He is alert and oriented to person, place, and time. He has normal strength. No cranial nerve deficit or sensory deficit. He displays a negative Romberg sign. Coordination and gait normal.  CN 2-12 intact Sensation and strength intact Intact FTN No pronator drift  Skin: Skin is warm and dry. No rash noted.  Psychiatric: He has a normal mood and affect.  Nursing note and vitals reviewed.      Assessment & Plan:   Problem List Items Addressed This Visit    Orthostatic hypotension - Primary    Clearly orthostatic hypotension based on history and log he brings. Some degree of autonomic instability but not necessarily consistent with a neurodegenerative disorder. Has already implemented several preventative measures  without success (compressino stockings, pushing fluids for hydration status, small increases in salt intake). Discussed other nonpharmacological measures including elastic stockings to waist, raising head of bed 10 degrees, and avoiding exertion in hot environment. Also discussed pharmacological measures - pt requests continued trial at non pharmacological treatment. If this fails to control sxs, would  consider trial of fludrocortisone. See pt instructions. I will also check with Dr. Rayann Heman about pletal use and possible orthostatic effect.        Follow up plan: Return if symptoms worsen or fail to improve.

## 2014-01-25 ENCOUNTER — Telehealth: Payer: Self-pay | Admitting: *Deleted

## 2014-01-25 NOTE — Telephone Encounter (Signed)
Message left advising patient.  

## 2014-01-25 NOTE — Telephone Encounter (Signed)
Would recommend low dose compression ~87mmHg given his h/o arterial insufficiency.

## 2014-01-25 NOTE — Telephone Encounter (Signed)
Pt states he would prescribed some stockings and he needs to know the amount of of compression in the hose. Please advise

## 2014-02-08 ENCOUNTER — Telehealth: Payer: Self-pay | Admitting: Family Medicine

## 2014-02-08 NOTE — Telephone Encounter (Signed)
plz notify - I checked with Dr Trula Slade and Allred about holding pletal. They agree. Let's stop pletal and monitor for dizziness/hypotension.

## 2014-02-08 NOTE — Telephone Encounter (Signed)
Patient notified and verbalized understanding. 

## 2014-02-27 DIAGNOSIS — H2513 Age-related nuclear cataract, bilateral: Secondary | ICD-10-CM | POA: Diagnosis not present

## 2014-03-07 ENCOUNTER — Other Ambulatory Visit: Payer: Self-pay

## 2014-03-07 DIAGNOSIS — B351 Tinea unguium: Secondary | ICD-10-CM | POA: Diagnosis not present

## 2014-03-07 DIAGNOSIS — L821 Other seborrheic keratosis: Secondary | ICD-10-CM | POA: Diagnosis not present

## 2014-03-07 DIAGNOSIS — Z85828 Personal history of other malignant neoplasm of skin: Secondary | ICD-10-CM | POA: Diagnosis not present

## 2014-03-07 DIAGNOSIS — D485 Neoplasm of uncertain behavior of skin: Secondary | ICD-10-CM | POA: Diagnosis not present

## 2014-03-07 DIAGNOSIS — L738 Other specified follicular disorders: Secondary | ICD-10-CM | POA: Diagnosis not present

## 2014-05-23 DIAGNOSIS — H903 Sensorineural hearing loss, bilateral: Secondary | ICD-10-CM | POA: Diagnosis not present

## 2014-06-26 ENCOUNTER — Ambulatory Visit (INDEPENDENT_AMBULATORY_CARE_PROVIDER_SITE_OTHER): Payer: Medicare Other | Admitting: Family Medicine

## 2014-06-26 ENCOUNTER — Encounter: Payer: Self-pay | Admitting: Family Medicine

## 2014-06-26 VITALS — BP 136/80 | HR 68 | Temp 98.1°F | Wt 154.5 lb

## 2014-06-26 DIAGNOSIS — I951 Orthostatic hypotension: Secondary | ICD-10-CM | POA: Diagnosis not present

## 2014-06-26 MED ORDER — CILOSTAZOL 50 MG PO TABS: 50.0000 mg | ORAL_TABLET | Freq: Two times a day (BID) | ORAL | Status: DC

## 2014-06-26 NOTE — Assessment & Plan Note (Signed)
Again discussed options. Remains hesitant for pharmacotherapy. Given some claudication symptoms , requests to try 1/2 dose pletal which is reasonable Will start pletal 50mg  bid. Pt's main goal is to continue participating in what he enjoys (gym and [place]).

## 2014-06-26 NOTE — Progress Notes (Signed)
BP 136/80 mmHg  Pulse 68  Temp(Src) 98.1 F (36.7 C) (Oral)  Wt 154 lb 8 oz (70.081 kg)   CC: f/u visit  Subjective:    Patient ID: Adam Clements, male    DOB: 1935/02/19, 80 y.o.   MRN: [id number]  HPI: Adam Clements is a 80 y.o. male presenting on 06/26/2014 for Follow-up   Known orthostatic hypotension - neurocardiogenic reflex syncope. Uses compression stockings regularly and liberalizes salt intake. Last visit we discussed fludrocortisone - pt declined pharmacotherapy. After consulting with [facility] and cards, we did trial off cilostazol to see if this would help at all. Since stopping, improved but still mildly persistent dizziness. No syncope in over a year. Dizziness "not as bad". Never tried waist high compression stockings.   Continues working with [employer] and goes to gym 3x/wk. Occasionally stays lightheaded. Noticing increased claudication sxs with exertion off pletal (ie picking up 8 ft ladder and walking 50 yards).   H/o PAD with claudication - compression stockings have actually helped. Also on aspirin 325mg .  Also with general fatigue. Declines labwork.   H/o NICM with last echo 11/2013 - EF 35-40%, diffuse hypokinesis, mild-mod MR, mild LA dilation. Known RBBB and mild bradycardia. Followed by Dr Rayann Heman.  Relevant past medical, surgical, family and social history reviewed and updated as indicated. Interim medical history since our last visit reviewed. Allergies and medications reviewed and updated. Current Outpatient Prescriptions on File Prior to Visit  Medication Sig  . Ascorbic Acid (VITAMIN C PO) Take 1 tablet by mouth 2 (two) times daily.   Marland Kitchen aspirin 325 MG tablet Take 325 mg by mouth daily.    Marland Kitchen CALCIUM-VITAMIN D PO Take 1,200 mg by mouth daily.   . psyllium (FIBER THERAPY) 0.52 G capsule Take 1 capsule by mouth 2 (two) times daily. Take 2 capsules by mouth twice a day   No current facility-administered medications on file prior to visit.     Review of Systems Per HPI unless specifically indicated above     Objective:    BP 136/80 mmHg  Pulse 68  Temp(Src) 98.1 F (36.7 C) (Oral)  Wt 154 lb 8 oz (70.081 kg)  Wt Readings from Last 3 Encounters:  06/26/14 154 lb 8 oz (70.081 kg)  01/24/14 153 lb 4 oz (69.514 kg)  12/27/13 150 lb 12 oz (68.38 kg)    Physical Exam  Constitutional: He appears well-developed and well-nourished. No distress.  HENT:  Head: Normocephalic and atraumatic.  Mouth/Throat: Oropharynx is clear and moist. No oropharyngeal exudate.  Cardiovascular: Normal rate, regular rhythm, normal heart sounds and intact distal pulses.   No murmur heard. Pulmonary/Chest: Effort normal and breath sounds normal. No respiratory distress. He has no wheezes. He has no rales.  Musculoskeletal: He exhibits no edema.  Diminished pulses bilaterally  Skin: Skin is warm and dry. No rash noted.  Psychiatric: He has a normal mood and affect.  Nursing note and vitals reviewed.  Results for orders placed or performed during the hospital encounter of 12/27/13  CBC  Result Value Ref Range   WBC 5.4 4.0 - 10.5 K/uL   RBC 3.72 (L) 4.22 - 5.81 MIL/uL   Hemoglobin 11.9 (L) 13.0 - 17.0 g/dL   HCT 34.5 (L) 39.0 - 52.0 %   MCV 92.7 78.0 - 100.0 fL   MCH 32.0 26.0 - 34.0 pg   MCHC 34.5 30.0 - 36.0 g/dL   RDW 12.5 11.5 - 15.5 %   Platelets  252 150 - 400 K/uL  Troponin I  Result Value Ref Range   Troponin I <0.30 <0.30 ng/mL  Comprehensive metabolic panel  Result Value Ref Range   Sodium 136 (L) 137 - 147 mEq/L   Potassium 3.7 3.7 - 5.3 mEq/L   Chloride 100 96 - 112 mEq/L   CO2 26 19 - 32 mEq/L   Glucose, Bld 112 (H) 70 - 99 mg/dL   BUN 24 (H) 6 - 23 mg/dL   Creatinine, Ser 1.29 0.50 - 1.35 mg/dL   Calcium 8.7 8.4 - 10.5 mg/dL   Total Protein 5.9 (L) 6.0 - 8.3 g/dL   Albumin 3.3 (L) 3.5 - 5.2 g/dL   AST 22 0 - 37 U/L   ALT 13 0 - 53 U/L   Alkaline Phosphatase 56 39 - 117 U/L   Total Bilirubin 0.5 0.3 - 1.2 mg/dL    GFR calc non Af Amer 52 (L) >90 mL/min   GFR calc Af Amer 60 (L) >90 mL/min   Anion gap 10 5 - 15  Urinalysis, Routine w reflex microscopic  Result Value Ref Range   Color, Urine YELLOW YELLOW   APPearance CLEAR CLEAR   Specific Gravity, Urine 1.008 1.005 - 1.030   pH 5.5 5.0 - 8.0   Glucose, UA NEGATIVE NEGATIVE mg/dL   Hgb urine dipstick NEGATIVE NEGATIVE   Bilirubin Urine NEGATIVE NEGATIVE   Ketones, ur NEGATIVE NEGATIVE mg/dL   Protein, ur NEGATIVE NEGATIVE mg/dL   Urobilinogen, UA 0.2 0.0 - 1.0 mg/dL   Nitrite NEGATIVE NEGATIVE   Leukocytes, UA NEGATIVE NEGATIVE      Assessment & Plan:   Problem List Items Addressed This Visit    Orthostatic hypotension - Primary    Again discussed options. Remains hesitant for pharmacotherapy. Given some claudication symptoms , requests to try 1/2 dose pletal which is reasonable Will start pletal 50mg  bid. Pt's main goal is to continue participating in what he enjoys (gym and [place]).          Follow up plan: Return in about 6 months (around 12/26/2014), or if symptoms worsen or fail to improve, for medicare wellness visit.

## 2014-06-26 NOTE — Progress Notes (Signed)
Pre visit review using our clinic review tool, if applicable. No additional management support is needed unless otherwise documented below in the visit note. 

## 2014-06-26 NOTE — Patient Instructions (Addendum)
Let's restart pletal 50mg  twice daily (lower dose). Watch for worsening dizziness or any passing out - and we should see improvement in leg fatigue symptoms. Return as needed or in 6 months for wellness visit.

## 2014-09-29 ENCOUNTER — Encounter: Payer: Self-pay | Admitting: Family Medicine

## 2014-09-29 ENCOUNTER — Ambulatory Visit (INDEPENDENT_AMBULATORY_CARE_PROVIDER_SITE_OTHER): Payer: Medicare Other | Admitting: Family Medicine

## 2014-09-29 VITALS — BP 88/58 | HR 92 | Temp 98.0°F | Wt 155.2 lb

## 2014-09-29 DIAGNOSIS — I951 Orthostatic hypotension: Secondary | ICD-10-CM

## 2014-09-29 DIAGNOSIS — I70219 Atherosclerosis of native arteries of extremities with intermittent claudication, unspecified extremity: Secondary | ICD-10-CM | POA: Diagnosis not present

## 2014-09-29 MED ORDER — FLUDROCORTISONE ACETATE 0.1 MG PO TABS: 0.1000 mg | ORAL_TABLET | Freq: Every day | ORAL | Status: DC

## 2014-09-29 NOTE — Patient Instructions (Addendum)
I think we need pletal for leg circulation. Continue compression stockings.  Let's start fludrocortisone 0.1mg  daily to keep blood pressures more steady. Let me know how you're doing with this.

## 2014-09-29 NOTE — Assessment & Plan Note (Signed)
pletal restarted due to claudication off med. Now better. Continue half dose pletal and high dose aspirin.

## 2014-09-29 NOTE — Progress Notes (Signed)
Pre visit review using our clinic review tool, if applicable. No additional management support is needed unless otherwise documented below in the visit note. 

## 2014-09-29 NOTE — Assessment & Plan Note (Addendum)
Pletal restarted 2/2 claudication symptoms.  Persistent orthostatic hypotension with pletal on board.  Pt willing to try fludrocortisone today - will start at 0.1mg  daily. Discussed side effects to monitor for. Update [place] with effect.  Continue good hydration, liberalized salt, knee high compression stockings (pt declines thigh high esp in this summer weather).

## 2014-09-29 NOTE — Progress Notes (Signed)
BP 88/58 mmHg  Pulse 92  Temp(Src) 98 F (36.7 C) (Oral)  Wt 155 lb 4 oz (70.421 kg)   CC: f/u known orthostatic hypotension  Subjective:    Patient ID: Adam Clements, male    DOB: [birth year], 80 y.o.   MRN: [id number]  HPI: Adam Clements is a 80 y.o. male presenting on 09/29/2014 for Follow-up   See prior note for details. Seen here 06/26/2014 with persistent known orthostatic hypotension - neurocardiogenic reflex syncope. Uses compression stockings regularly and liberalizes salt intake. Declined fludrocortisone in the past. Does not want to try waist high compression stockings.   After consulting with [facility] /cards, we trialed off cilostazol which was helpful (no syncope i nlast year, dizziness improved). However claudication sxs slowly started recurring so we restarted 1/2 dose pletal (50mg  BID). Pt's main goal was to continue participating in gym and [employer].   With restarting pletal noticed he's getting more lightheaded and blood pressures are dropping. Some confusion with this. No syncope. Interested in [place] of fludrocortinef.   H/o PAD with claudication - compression stockings have actually helped. Also on aspirin 325mg .   H/o NICM with last echo 11/2013 - EF 35-40%, diffuse hypokinesis, mild-mod MR, mild LA dilation. Known RBBB and mild bradycardia. Followed by Dr Rayann Heman.  Relevant past medical, surgical, family and social history reviewed and updated as indicated. Interim medical history since our last visit reviewed. Allergies and medications reviewed and updated. Current Outpatient Prescriptions on File Prior to Visit  Medication Sig  . Ascorbic Acid (VITAMIN C PO) Take 1 tablet by mouth 2 (two) times daily.   Marland Kitchen aspirin 325 MG tablet Take 325 mg by mouth daily.    Marland Kitchen CALCIUM-VITAMIN D PO Take 1,200 mg by mouth daily.   . cilostazol (PLETAL) 50 MG tablet Take 1 tablet (50 mg total) by mouth 2 (two) times daily.  . psyllium (FIBER THERAPY) 0.52 G capsule Take  1 capsule by mouth daily.    No current facility-administered medications on file prior to visit.    Review of Systems Per HPI unless specifically indicated above     Objective:    BP 88/58 mmHg  Pulse 92  Temp(Src) 98 F (36.7 C) (Oral)  Wt 155 lb 4 oz (70.421 kg)  Wt Readings from Last 3 Encounters:  09/29/14 155 lb 4 oz (70.421 kg)  06/26/14 154 lb 8 oz (70.081 kg)  01/24/14 153 lb 4 oz (69.514 kg)    Physical Exam  Constitutional: He appears well-developed and well-nourished. No distress.  HENT:  Mouth/Throat: Oropharynx is clear and moist. No oropharyngeal exudate.  Neck: No thyromegaly present.  Cardiovascular: Normal rate, regular rhythm, normal heart sounds and intact distal pulses.   No murmur heard. Pulmonary/Chest: Effort normal and breath sounds normal. No respiratory distress. He has no wheezes. He has no rales.  Musculoskeletal: He exhibits no edema.  Skin: Skin is warm and dry. No rash noted.  Nursing note and vitals reviewed.     Assessment & Plan:   Problem List Items Addressed This Visit    Atherosclerosis of native arteries of extremity with intermittent claudication    pletal restarted due to claudication off med. Now better. Continue half dose pletal and high dose aspirin.      Orthostatic hypotension - Primary    Pletal restarted 2/2 claudication symptoms.  Persistent orthostatic hypotension with pletal on board.  Pt willing to try fludrocortisone today - will start at 0.1mg  daily. Discussed side  effects to monitor for. Update [place] with effect.  Continue good hydration, liberalized salt, knee high compression stockings (pt declines thigh high esp in this summer weather).          Follow up plan: Return if symptoms worsen or fail to improve.

## 2014-10-28 ENCOUNTER — Other Ambulatory Visit: Payer: Self-pay | Admitting: Family Medicine

## 2014-11-10 ENCOUNTER — Telehealth: Payer: Self-pay | Admitting: Family Medicine

## 2014-11-10 NOTE — Telephone Encounter (Signed)
PLEASE NOTE: All timestamps contained within this report are represented as [place] Standard Time. CONFIDENTIALTY NOTICE: This fax transmission is intended only for the addressee. It contains information that is legally privileged, confidential or otherwise protected from use or disclosure. If you are not the intended recipient, you are strictly prohibited from reviewing, disclosing, copying using or disseminating any of this information or taking any action in reliance on or regarding this information. If you have received this fax in error, please notify us immediately by telephone so that we can arrange for its return to [place]. Phone: [phone], Toll-Free: [phone], Fax: [phone] Page: 1 of 2 Call Id: [id number] [place] Patient Name: Adam Clements Gender: Male DOB: Nov 14, 1934 Age: 80 Y 6 M 13 D Return Phone Number: [phone] (Primary) Address: [place] City/State/Zip: [place] [address] Client [facility] - Client Client Site [place] - Day Physician Ria Bush Contact Type Call Call Type Triage / Clinical Relationship To Patient Self Appointment Disposition EMR Appointment Not Necessary Info pasted into Epic Yes Return Phone Number [phone] (Primary) Chief Complaint Health information question (non symptomatic) Initial Comment Caller states two occasions working in sun in [place] where he could formulate speech in his mind but not speak it; one in July and one yesterday; lasts about 15 mins until cooling off; no sx now; Nurse Assessment Nurse: Ronnald Ramp, RN, Miranda Date/Time (Eastern Time): 11/10/2014 11:04:47 AM Confirm and document reason for call. If symptomatic, describe symptoms. ---Caller states he has had 2 episodes where he had speech was slurred while he was working out in the high heat. The  symptoms resolve when he gets out of the heat. He is not currently having symptoms. Has the patient traveled out of the country within the last 30 days? ---Not Applicable Does the patient require triage? ---No Please document clinical information provided and list any resource used. ---Information given regarding heat exhaustion given per reference: RareVoices.pl firstAidHeatExhaustion.htm?Imiw9cApl Heat exhaustion is the moderate form of heat illness. Heat illness occurs when a person's core body temperature rises above a safe level of the body's internal temperature range. Heat cramps are the earliest sign of heat illness. If precautions to cool off and rehydrate at this point are not made, the more severe stage of heat illness, heat exhaustion, can occur in a rapid progression. Use a combination of the following measures depending on the circumstances and means available: .Have the person rest, legs slightly elevated, in a shaded area or cool or air-conditioned building, room, or car. .Give the person an electrolyte drink, such as Gatorade Guidelines Guideline Title Affirmed Question Affirmed Notes Nurse Date/Time ([place] Time) Disp. Time Eilene Ghazi Time) Disposition Final User 11/10/2014 11:14:26 AM Clinical Call Yes Ronnald Ramp, RN, Miranda PLEASE NOTE: All timestamps contained within this report are represented as [place] Standard Time. CONFIDENTIALTY NOTICE: This fax transmission is intended only for the addressee. It contains information that is legally privileged, confidential or otherwise protected from use or disclosure. If you are not the intended recipient, you are strictly prohibited from reviewing, disclosing, copying using or disseminating any of this information or taking any action in reliance on or regarding this information. If you have received this fax in error, please notify us immediately by telephone so that we can arrange for its return to  [place]. Phone: [phone], Toll-Free: [phone], Fax: [phone] Page: 2 of 2 Call Id: [id number] After Care  Instructions Given Call Event Type User Date / Time Description Comments User: Leverne Humbles, RN Date/Time Eilene Ghazi Time): 11/10/2014 11:13:03 AM Office had already scheduled pt an appt for next week. Cancelled appt because he is not having any symptoms now and RN does not feel the symptoms he had are not unexpected under the circumstance.

## 2014-11-10 NOTE — Telephone Encounter (Signed)
Patient Name: Adam Clements DOB: [birth year] Initial Comment Caller states two occasions working in sun in [place] where he could formulate speech in his mind but not speak it; one in July and one yesterday; lasts about 15 mins until cooling off; no sx now; Nurse Assessment Nurse: Ronnald Ramp, RN, Miranda Date/Time (Eastern Time): 11/10/2014 11:04:47 AM Confirm and document reason for call. If symptomatic, describe symptoms. ---Caller states he has had 2 episodes where he had speech was slurred while he was working out in the high heat. The symptoms resolve when he gets out of the heat. He is not currently having symptoms. Has the patient traveled out of the country within the last 30 days? ---Not Applicable Does the patient require triage? ---No Please document clinical information provided and list any resource used. ---Information given regarding heat exhaustion given per reference: RareVoices.pl firstAidHeatExhaustion.htm?Imiw9cApl Heat exhaustion is the moderate form of heat illness. Heat illness occurs when a person's core body temperature rises above a safe level of the body's internal temperature range. Heat cramps are the earliest sign of heat illness. If precautions to cool off and rehydrate at this point are not made, the more severe stage of heat illness, heat exhaustion, can occur in a rapid progression. Use a combination of the following measures depending on the circumstances and means available: Have the person rest, legs slightly elevated, in a shaded area or cool or air-conditioned building, room, or car. Give the person an electrolyte drink, such as Gatorade Guidelines Guideline Title Affirmed Question Affirmed Notes Final Disposition User Comments Office had already scheduled pt an appt for next week. Cancelled appt because he is not having any symptoms now and RN does not feel the symptoms he had are not unexpected under the circumstance.

## 2014-11-11 NOTE — Telephone Encounter (Signed)
Noted. Agree. Consistent with heat exhaustion.

## 2014-11-14 ENCOUNTER — Ambulatory Visit: Payer: Medicare Other | Admitting: Family Medicine

## 2014-11-22 ENCOUNTER — Encounter: Payer: Self-pay | Admitting: Internal Medicine

## 2014-11-22 ENCOUNTER — Ambulatory Visit (INDEPENDENT_AMBULATORY_CARE_PROVIDER_SITE_OTHER): Payer: Medicare Other | Admitting: Internal Medicine

## 2014-11-22 VITALS — BP 120/90 | HR 82 | Ht 69.0 in | Wt 154.8 lb

## 2014-11-22 DIAGNOSIS — I951 Orthostatic hypotension: Secondary | ICD-10-CM | POA: Diagnosis not present

## 2014-11-22 DIAGNOSIS — I519 Heart disease, unspecified: Secondary | ICD-10-CM

## 2014-11-22 DIAGNOSIS — I70219 Atherosclerosis of native arteries of extremities with intermittent claudication, unspecified extremity: Secondary | ICD-10-CM

## 2014-11-22 NOTE — Progress Notes (Signed)
PCP: Ria Bush, MD  The patient presents today for routine followup. He reports doing very well since last being seen in our clinic.  His claudication is stable.  Orthostasis is much improved with florinef.  He has reduced EF but with class I/II symptoms.  Medical therapy has been limited by symptomatic hypotension.   The patient denies symptoms of palpitations, chest pain, shortness of breath, orthopnea, PND, lower extremity edema, presyncope, or recent syncope.  He did have 2 episodes over the summer where he developed an expressive aphasia while working in the heat for [employer].  Denies any other neurologic symptoms.  The patient is tolerating medications without difficulties and is otherwise without complaint today.    Past Medical History  Diagnosis Date  . Allergic rhinitis   . Diverticulosis of colon     conoscopy 12/1993 (Dr. Amedeo Plenty) S/G divertics//colonoscopy L&R divertics (Dr. Amedeo Plenty) 11/08/2004  . PVC (premature ventricular contraction)   . Bradycardia   . Gout   . HTN (hypertension)   . PVD (peripheral vascular disease)     followed by Dr Trula Slade, treating medically (pletal and aspirin)  . Mitral regurgitation     Pulmonic regurgitation, with marked LA enlargement  . CAD (coronary artery disease)     nonobstructive  . Nonischemic cardiomyopathy     EF 40%  . Arthritis     Gout  . BPH (benign prostatic hypertrophy)   . Chronic anemia 2013    h/o IDA, did not tolerate oral iron, latest normal iron studies, B12, folate  . RBBB longstanding  . Squamous cell carcinoma in situ of skin 2015    L ear   Past Surgical History  Procedure Laterality Date  . Appendectomy    . Dexa  2004    "shrunk 2 inches", WNL  . 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 Ascorbic Acid (VITAMIN C PO) Take 1 tablet by mouth 2 (two) times daily.     Marland Kitchen aspirin 325 MG tablet Take 325 mg by mouth daily.       Marland Kitchen CALCIUM-VITAMIN D PO Take 1,200 mg by mouth daily.     . cilostazol (PLETAL) 50 MG tablet take 1 tablet by mouth twice a day 60 tablet 3  . fludrocortisone (FLORINEF) 0.1 MG tablet Take 1 tablet (0.1 mg total) by mouth daily. 30 tablet 6  . psyllium (FIBER THERAPY) 0.52 G capsule Take 1 capsule by mouth 2 (two) times daily.      No current facility-administered medications for this visit.    No Known Allergies  Social History   Social History  . Marital Status: Married    Spouse Name: N/A  . Number of Children: N/A  . Years of Education: N/A   Occupational History  . Not on file.   Social History Main Topics  . Smoking status: Former Smoker -- 2.00 packs/day    Types: Cigarettes    Quit date: 11/10/1982  . Smokeless tobacco: Former Systems developer  . Alcohol Use: 8.4 oz/week    10 Glasses of wine, 4 Cans of beer per week     Comment: 1-2 drinks/day  . Drug Use: No  . Sexual Activity: No   Other Topics Concern  . Not on file   Social History Narrative   Lives with wife [name]). Dog passed away 11-06-2012.   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 HCPOA is wife then son and then oldest daughter etc. Advanced directives in chart (10/2012). Does not want artificial nutrition/hydration or prolonged life support    Family History  Problem Relation Age of Onset  . Hypertension    . Heart disease Mother 52    Natural causes with CHF  . Hypertension Mother   . Hypertension Father   . Stroke Father     cerebral hemorrhage  . Hyperlipidemia Brother   . Heart disease Brother     before age 59  . Hypertension Brother   . Heart attack Brother      Physical Exam: Filed Vitals:   11/22/14 1049  BP: 120/90  Pulse: 82  Height: 5\' 9"  (1.753  m)  Weight: 70.217 kg (154 lb 12.8 oz)    GEN- The patient is well appearing, alert and oriented x 3 today.   Head- normocephalic, atraumatic Eyes-  Sclera clear, conjunctiva pink Ears- hearing intact Oropharynx- clear Neck- supple, no JVP, no bruit Lymph- no cervical lymphadenopathy Lungs- Clear to ausculation bilaterally, normal work of breathing Heart- Regular rate and rhythm, no murmurs, rubs or gallops, PMI not laterally displaced,diminished DP/PT pulses today GI- soft, NT, ND, + BS Extremities- no clubbing, cyanosis, or edema, extremities warm and cool  ekg today reveals sinus rhythm 82 bpm, PR 170, QRS 150, RBBB, rare PVCs Echo 2015 is reviewed, lipids 2015 ar reviewed Epic records are reviewed  Assessment and Plan:  1. PVCs Asymptomatic No changes  2. Nonischemic CM NYHA Class I/II Symptomatic pustural hypotension limits our ability to treat  3. Postural dizziness Adequate hydration and salt liberalization encouraged Improved with florinef  4. PAD Follows with Dr Trula Slade Lipids reviewed, he wants to have repeat labs by primary care (declines today)  5. Expression aphasia No neurologic findings of concern today, though I am worried that he may have had a TIA.  Symptomatic hypotension due to dehydration with reduced cerebral perfusion is also possible. I have expressed concern today and have advised head CT with intra/extra cranial vessels as well as referral to neurology.  He is clear in his decision to decline.  He will discuss further when he sees primary care.  He states that he does not feel that further workup is indicated at this time.  States "I just got too hot".  Return to see Truitt Merle in 1 year EP issues are stable May benefit from general cardiology referral in the future  Thompson Grayer MD, [facility] 11/22/2014 11:20 AM

## 2014-11-22 NOTE — Patient Instructions (Signed)
Medication Instructions:  No changes were made to your medications today  Labwork: N/A  Testing/Procedures: N/A  Follow-Up: Your physician wants you to follow-up in: 12 months with Truitt Merle, NP. You will receive a reminder letter in the mail two months in advance. If you don't receive a letter, please call our office to schedule the follow-up appointment.   Any Other Special Instructions Will Be Listed Below (If Applicable).

## 2014-11-27 DIAGNOSIS — Z23 Encounter for immunization: Secondary | ICD-10-CM | POA: Diagnosis not present

## 2015-01-02 ENCOUNTER — Telehealth: Payer: Self-pay | Admitting: Family Medicine

## 2015-01-02 ENCOUNTER — Encounter: Payer: Self-pay | Admitting: Family Medicine

## 2015-01-02 ENCOUNTER — Ambulatory Visit (INDEPENDENT_AMBULATORY_CARE_PROVIDER_SITE_OTHER): Payer: Medicare Other | Admitting: Family Medicine

## 2015-01-02 ENCOUNTER — Ambulatory Visit
Admission: RE | Admit: 2015-01-02 | Discharge: 2015-01-02 | Disposition: A | Discharge status: Home / Self Care | Payer: Medicare Other | Source: Ambulatory Visit | Attending: Family Medicine | Admitting: Family Medicine

## 2015-01-02 VITALS — BP 110/60 | HR 80 | Temp 97.4°F | Wt 153.5 lb

## 2015-01-02 DIAGNOSIS — I951 Orthostatic hypotension: Secondary | ICD-10-CM | POA: Diagnosis not present

## 2015-01-02 DIAGNOSIS — I10 Essential (primary) hypertension: Secondary | ICD-10-CM

## 2015-01-02 DIAGNOSIS — R4789 Other speech disturbances: Secondary | ICD-10-CM | POA: Diagnosis not present

## 2015-01-02 DIAGNOSIS — R29810 Facial weakness: Secondary | ICD-10-CM

## 2015-01-02 DIAGNOSIS — R4781 Slurred speech: Secondary | ICD-10-CM | POA: Diagnosis not present

## 2015-01-02 DIAGNOSIS — R42 Dizziness and giddiness: Secondary | ICD-10-CM | POA: Diagnosis not present

## 2015-01-02 DIAGNOSIS — G459 Transient cerebral ischemic attack, unspecified: Secondary | ICD-10-CM

## 2015-01-02 DIAGNOSIS — I739 Peripheral vascular disease, unspecified: Secondary | ICD-10-CM | POA: Diagnosis not present

## 2015-01-02 LAB — CBC WITH DIFFERENTIAL/PLATELET
Basophils Absolute: 0 10*3/uL (ref 0.0–0.1)
Basophils Relative: 0.2 % (ref 0.0–3.0)
EOS ABS: 0.2 10*3/uL (ref 0.0–0.7)
Eosinophils Relative: 3.8 % (ref 0.0–5.0)
HCT: 35.9 % — ABNORMAL LOW (ref 39.0–52.0)
Hemoglobin: 12.1 g/dL — ABNORMAL LOW (ref 13.0–17.0)
LYMPHS ABS: 0.9 10*3/uL (ref 0.7–4.0)
Lymphocytes Relative: 23 % (ref 12.0–46.0)
MCHC: 33.8 g/dL (ref 30.0–36.0)
MCV: 94.9 fl (ref 78.0–100.0)
MONOS PCT: 9.8 % (ref 3.0–12.0)
Monocytes Absolute: 0.4 10*3/uL (ref 0.1–1.0)
NEUTROS PCT: 63.2 % (ref 43.0–77.0)
Neutro Abs: 2.5 10*3/uL (ref 1.4–7.7)
Platelets: 199 10*3/uL (ref 150.0–400.0)
RBC: 3.79 Mil/uL — AB (ref 4.22–5.81)
RDW: 13.6 % (ref 11.5–15.5)
WBC: 4 10*3/uL (ref 4.0–10.5)

## 2015-01-02 LAB — TSH: TSH: 1.18 u[IU]/mL (ref 0.35–4.50)

## 2015-01-02 MED ORDER — ASPIRIN-DIPYRIDAMOLE ER 25-200 MG PO CP12: 1.0000 | ORAL_CAPSULE | Freq: Two times a day (BID) | ORAL | Status: DC

## 2015-01-02 MED ORDER — ATORVASTATIN CALCIUM 20 MG PO TABS: 20.0000 mg | ORAL_TABLET | Freq: Every day | ORAL | Status: DC

## 2015-01-02 NOTE — Assessment & Plan Note (Signed)
This has been stable since he started fludrocortisone 0.1mg  daily.  No hypertension on this.

## 2015-01-02 NOTE — Telephone Encounter (Signed)
[facility]  Patient Name: Adam Clements  DOB: 1934-08-12    Initial Comment Caller states, he has had a 3rd episode where he can't think straight. He know what he wants to say, but he can't form the words to say it. The last episode was yesterday.    Nurse Assessment  Nurse: Wynetta Emery, RN, Baker Janus Date/Time Eilene Ghazi Time): 01/02/2015 9:32:41 AM  Confirm and document reason for call. If symptomatic, describe symptoms. ---[name] has had 3 episodes where he can't say what he wants to say unable to focus can't speak words he is thinking -- preparing for colonoscopy yesterday and could not verbalize thoughts. lasting about 1 hour and notes he could not read not comprehending what he is reading.  Has the patient traveled out of the country within the last 30 days? ---No  Does the patient have any new or worsening symptoms? ---Yes  Will a triage be completed? ---Yes  Related visit to physician within the last 2 weeks? ---No  Does the PT have any chronic conditions? (i.e. diabetes, asthma, etc.) ---Unknown     Guidelines    Guideline Title Affirmed Question Affirmed Notes  Neurologic Deficit [1] Loss of speech or garbled speech AND [2] gradual onset (e.g., days to weeks) AND [3] present now    Final Disposition User   See Physician within 4 Hours (or PCP triage) Wynetta Emery, RNBaker Janus    Comments  1115am 02-02-2015 Dr. Danise Mina c/o TIA s.sx   Referrals  REFERRED TO PCP OFFICE

## 2015-01-02 NOTE — Telephone Encounter (Signed)
Pt has appt 01/02/15 at 11:15 with [name].

## 2015-01-02 NOTE — Progress Notes (Signed)
Pre visit review using our clinic review tool, if applicable. No additional management support is needed unless otherwise documented below in the visit note. 

## 2015-01-02 NOTE — Progress Notes (Signed)
BP 110/60 mmHg  Pulse 80  Temp(Src) 97.4 F (36.3 C) (Oral)  Wt 153 lb 8 oz (69.627 kg)   CC: neurological symptoms  Subjective:    Patient ID: Adam Clements, male    DOB: 1934-11-15, 80 y.o.   MRN: [id number]  HPI: Adam Clements is a 80 y.o. male presenting on 01/02/2015 for Neurologic Problem   Pleasant patient of mine with known h/o orthostatic hypotension on fludrocortisone 0.1mg  daily, known PAD on pletal and aspirin 325mg  daily presents with episode of confusion while preparing for colonoscopy yesterday 5pm - trouble understanding colonoscopy report, also noted trouble forming words when went outside to talk with wife as well as slurred speech. Wife thought mouth was sagging on right. sxs resolved within the next 1-2 hours. This morning when he awoke noticed R sided headache and malaise. Also endorses intermittent dark spot in vision for years.   Pt denies unilateral weakness, unsteadiness, numbness or paresthesia, dysphagia. Denies chest pain, dyspnea,   Colonoscopy was cancelled. Advised to go to ER - but did not go.   Strong fmhx CAD and stroke.  MRI w/ and w/o contrast overall stable (10/2013) - checked for sensorineural hearing loss by Dr Thornell Mule. 2D echo stable dilated cardiomyopathy with ER 35-40% 11/2013 Carotid US stable B 0-39% ICA stenosis 03/2012 Known PAD with mod-severe disease by ABIs. On pletal for this.   Relevant past medical, surgical, family and social history reviewed and updated as indicated. Interim medical history since our last visit reviewed. Allergies and medications reviewed and updated. Current Outpatient Prescriptions on File Prior to Visit  Medication Sig  . Ascorbic Acid (VITAMIN C PO) Take 1 tablet by mouth 2 (two) times daily.   Marland Kitchen CALCIUM-VITAMIN D PO Take 1,200 mg by mouth daily.   . cilostazol (PLETAL) 50 MG tablet take 1 tablet by mouth twice a day  . fludrocortisone (FLORINEF) 0.1 MG tablet Take 1 tablet (0.1 mg total) by mouth daily.  .  psyllium (FIBER THERAPY) 0.52 G capsule Take 1 capsule by mouth 2 (two) times daily.    No current facility-administered medications on file prior to visit.   Past Medical History  Diagnosis Date  . Allergic rhinitis   . Diverticulosis of colon     conoscopy 12/1993 (Dr. Amedeo Plenty) S/G divertics//colonoscopy L&R divertics (Dr. Amedeo Plenty) 11/08/2004  . PVC (premature ventricular contraction)   . Bradycardia   . Gout   . HTN (hypertension)   . PVD (peripheral vascular disease) ([place])     followed by Dr Trula Slade, treating medically (pletal and aspirin)  . Mitral regurgitation     Pulmonic regurgitation, with marked LA enlargement  . CAD (coronary artery disease)     nonobstructive  . Nonischemic cardiomyopathy (HCC)     EF 40%  . Arthritis     Gout  . BPH (benign prostatic hypertrophy)   . Chronic anemia 2013    h/o IDA, did not tolerate oral iron, latest normal iron studies, B12, folate  . RBBB longstanding  . Squamous cell carcinoma in situ of skin 2015    L ear    Past Surgical History  Procedure Laterality Date  . Appendectomy    . Dexa  2004    "shrunk 2 inches", WNL  . Abd/pelvic ct horseshoe kidney 10/10/04      Review of Systems Per HPI unless specifically indicated above     Objective:    BP 110/60 mmHg  Pulse 80  Temp(Src) 97.4 F (36.3 C) (  Oral)  Wt 153 lb 8 oz (69.627 kg)  Wt Readings from Last 3 Encounters:  01/02/15 153 lb 8 oz (69.627 kg)  11/22/14 154 lb 12.8 oz (70.217 kg)  09/29/14 155 lb 4 oz (70.421 kg)    Physical Exam  Constitutional: He appears well-developed and well-nourished. No distress.  HENT:  Mouth/Throat: Oropharynx is clear and moist. No oropharyngeal exudate.  Neck: Carotid bruit is not present. No thyromegaly present.  Cardiovascular: Normal rate, regular rhythm, normal heart sounds and intact distal pulses.   No murmur heard. Pulmonary/Chest: Effort normal and breath sounds normal. No respiratory distress. He has no wheezes. He has no  rales.  Musculoskeletal: He exhibits no edema.  Lymphadenopathy:    He has no cervical adenopathy.  Skin: Skin is warm and dry. No rash noted.  Psychiatric: He has a normal mood and affect.  Nursing note and vitals reviewed.      Assessment & Plan:   Problem List Items Addressed This Visit    TIA (transient ischemic attack) - Primary    Concern for TIA symptoms (slurred speech, facial weakness, and trouble forming words) yesterday during prep for colonoscopy in h/o orthostatic syncope. BP stable today. Discussed importance of cholesterol control and blood pressure control - historically well controlled.  If true TIA which is likely, has failed full dose aspirin 325mg . Will change to aggrenox BID, will start atorvastatin 20mg  daily - in his case for plaque stabilization effect. Check head CT without contrast, update carotid ultrasound. Check BMP, CBC, TSH and dLDL today (nonfasting) Discussed in detail reasons to seen ER care if recurrent symptoms. Offered neurology appt - pt will consider. EKG today - NSR rate 60, normal axis, intervals, no acute ST/T changes ,?borderline RBBB.      Relevant Medications   atorvastatin (LIPITOR) 20 MG tablet   Other Relevant Orders   TSH   CBC with Differential/Platelet   Basic metabolic panel   LDL Cholesterol, Direct   CT Head Wo Contrast   Carotid   EKG 12-Lead (Completed)   PAD (peripheral artery disease) (HCC)    Continue pletal. When this was stopped, intermittent claudication sxs recurred.      Relevant Medications   atorvastatin (LIPITOR) 20 MG tablet   Orthostatic hypotension    This has been stable since he started fludrocortisone 0.1mg  daily.  No hypertension on this.      Relevant Medications   atorvastatin (LIPITOR) 20 MG tablet       Follow up plan: No Follow-up on file.

## 2015-01-02 NOTE — Assessment & Plan Note (Signed)
Continue pletal. When this was stopped, intermittent claudication sxs recurred.

## 2015-01-02 NOTE — Patient Instructions (Signed)
Concern for mini stroke.  labwork today. If recurrent symptoms please go to ER.  Let's stop aspirin, start aggrenox twice daily, start lipitor (atorvastatin) 20mg  nightly.  EKG today.  Pass by our referral coordinators to schedule upcoming imaging studies.  Return to see me in 1 month.   Transient Ischemic Attack A transient ischemic attack (TIA) is a "warning stroke" that causes stroke-like symptoms. Unlike a stroke, a TIA does not cause permanent damage to the brain. The symptoms of a TIA can happen very fast and do not last long. It is important to know the symptoms of a TIA and what to do. This can help prevent a major stroke or death. CAUSES  A TIA is caused by a temporary blockage in an artery in the brain or neck (carotid artery). The blockage does not allow the brain to get the blood supply it needs and can cause different symptoms. The blockage can be caused by either:  A blood clot.  Fatty buildup (plaque) in a neck or brain artery. RISK FACTORS  High blood pressure (hypertension).  High cholesterol.  Diabetes mellitus.  Heart disease.  The buildup of plaque in the blood vessels (peripheral artery disease or atherosclerosis).  The buildup of plaque in the blood vessels that provide blood and oxygen to the brain (carotid artery stenosis).  An abnormal heart rhythm (atrial fibrillation).  Obesity.  Using any tobacco products, including cigarettes, chewing tobacco, or electronic cigarettes.  Taking oral contraceptives, especially in combination with using tobacco.  Physical inactivity.  A diet high in fats, salt (sodium), and calories.  Excessive alcohol use.  Use of illegal drugs (especially cocaine and methamphetamine).  Being male.  Being African American.  Being over the age of 65 years.  Family history of stroke.  Previous history of blood clots, stroke, TIA, or heart attack.  Sickle cell disease. SIGNS AND SYMPTOMS  TIA symptoms are the same as a  stroke but are temporary. These symptoms usually develop suddenly, or may be newly present upon waking from sleep:  Sudden weakness or numbness of the face, arm, or leg, especially on one side of the body.  Sudden trouble walking or difficulty moving arms or legs.  Sudden confusion.  Sudden personality changes.  Trouble speaking (aphasia) or understanding.  Difficulty swallowing.  Sudden trouble seeing in one or both eyes.  Double vision.  Dizziness.  Loss of balance or coordination.  Sudden severe headache with no known cause.  Trouble reading or writing.  Loss of bowel or bladder control.  Loss of consciousness. DIAGNOSIS  Your health care provider may be able to determine the presence or absence of a TIA based on your symptoms, history, and physical exam. CT scan of the brain is usually performed to help identify a TIA. Other tests may include:  Electrocardiography (ECG).  Continuous heart monitoring.  Echocardiography.  Carotid ultrasonography.  MRI.  A scan of the brain circulation.  Blood tests. TREATMENT  Since the symptoms of TIA are the same as a stroke, it is important to seek treatment as soon as possible. You may need a medicine to dissolve a blood clot (thrombolytic) if that is the cause of the TIA. This medicine cannot be given if too much time has passed. Treatment may also include:   Rest, oxygen, fluids through an IV tube, and medicines to thin the blood (anticoagulants).  Measures will be taken to prevent short-term and long-term complications, including infection from breathing foreign material into the lungs (aspiration pneumonia), blood  clots in the legs, and falls.  Procedures to either remove plaque in the carotid arteries or dilate carotid arteries that have narrowed due to plaque. Those procedures are:  Carotid endarterectomy.  Carotid angioplasty and stenting.  Medicines and diet may be used to address diabetes, high blood pressure,  and other underlying risk factors. HOME CARE INSTRUCTIONS   Take medicines only as directed by your health care provider. Follow the directions carefully. Medicines may be used to control risk factors for a stroke. Be sure you understand all your medicine instructions.  You may be told to take aspirin or the anticoagulant warfarin. Warfarin needs to be taken exactly as instructed.  Taking too much or too little warfarin is dangerous. Too much warfarin increases the risk of bleeding. Too little warfarin continues to allow the risk for blood clots. While taking warfarin, you will need to have regular blood tests to measure your blood clotting time. A PT blood test measures how long it takes for blood to clot. Your PT is used to calculate another value called an INR. Your PT and INR help your health care provider to adjust your dose of warfarin. The dose can change for many reasons. It is critically important that you take warfarin exactly as prescribed.  Many foods, especially foods high in vitamin K can interfere with warfarin and affect the PT and INR. Foods high in vitamin K include spinach, kale, broccoli, cabbage, collard and turnip greens, Brussels sprouts, peas, cauliflower, seaweed, and parsley, as well as beef and pork liver, green tea, and soybean oil. You should eat a consistent amount of foods high in vitamin K. Avoid major changes in your diet, or notify your health care provider before changing your diet. Arrange a visit with a dietitian to answer your questions.  Many medicines can interfere with warfarin and affect the PT and INR. You must tell your health care provider about any and all medicines you take; this includes all vitamins and supplements. Be especially cautious with aspirin and anti-inflammatory medicines. Do not take or discontinue any prescribed or over-the-counter medicine except on the advice of your health care provider or pharmacist.  Warfarin can have side effects, such  as excessive bruising or bleeding. You will need to hold pressure over cuts for longer than usual. Your health care provider or pharmacist will discuss other potential side effects.  Avoid sports or activities that may cause injury or bleeding.  Be careful when shaving, flossing your teeth, or handling sharp objects.  Alcohol can change the body's ability to handle warfarin. It is best to avoid alcoholic drinks or consume only very small amounts while taking warfarin. Notify your health care provider if you change your alcohol intake.  Notify your dentist or other health care providers before procedures.  Eat a diet that includes 5 or more servings of fruits and vegetables each day. This may reduce the risk of stroke. Certain diets may be prescribed to address high blood pressure, high cholesterol, diabetes, or obesity.  A diet low in sodium, saturated fat, trans fat, and cholesterol is recommended to manage high blood pressure.  A diet low in saturated fat, trans fat, and cholesterol, and high in fiber may control cholesterol levels.  A controlled-carbohydrate, controlled-sugar diet is recommended to manage diabetes.  A reduced-calorie diet that is low in sodium, saturated fat, trans fat, and cholesterol is recommended to manage obesity.  Maintain a healthy weight.  Stay physically active. It is recommended that you get at least  30 minutes of activity on most or all days.  Do not use any tobacco products, including cigarettes, chewing tobacco, or electronic cigarettes. If you need help quitting, ask your health care provider.  Limit alcohol intake to no more than 1 drink per day for nonpregnant women and 2 drinks per day for men. One drink equals 12 ounces of beer, 5 ounces of wine, or 1 ounces of hard liquor.  Do not abuse drugs.  A safe home environment is important to reduce the risk of falls. Your health care provider may arrange for specialists to evaluate your home. Having grab  bars in the bedroom and bathroom is often important. Your health care provider may arrange for equipment to be used at home, such as raised toilets and a seat for the shower.  Follow all instructions for follow-up with your health care provider. This is very important. This includes any referrals and lab tests. Proper follow-up can prevent a stroke or another TIA from occurring. PREVENTION  The risk of a TIA can be decreased by appropriately treating high blood pressure, high cholesterol, diabetes, heart disease, and obesity, and by quitting smoking, limiting alcohol, and staying physically active. SEEK MEDICAL CARE IF:  You have personality changes.  You have difficulty swallowing.  You are seeing double.  You have dizziness.  You have a fever. SEEK IMMEDIATE MEDICAL CARE IF:  Any of the following symptoms may represent a serious problem that is an emergency. Do not wait to see if the symptoms will go away. Get medical help right away. Call your local emergency services (911 in [place] Do not drive yourself to the hospital.  You have sudden weakness or numbness of the face, arm, or leg, especially on one side of the body.  You have sudden trouble walking or difficulty moving arms or legs.  You have sudden confusion.  You have trouble speaking (aphasia) or understanding.  You have sudden trouble seeing in one or both eyes.  You have a loss of balance or coordination.  You have a sudden, severe headache with no known cause.  You have new chest pain or an irregular heartbeat.  You have a partial or total loss of consciousness. MAKE SURE YOU:   Understand these instructions.  Will watch your condition.  Will get help right away if you are not doing well or get worse.   This information is not intended to replace advice given to you by your health care provider. Make sure you discuss any questions you have with your health care provider.   Document Released: 12/18/2004  Document Revised: 03/31/2014 Document Reviewed: 06/15/2013 Elsevier Interactive Patient Education [employer].

## 2015-01-02 NOTE — Assessment & Plan Note (Addendum)
Concern for TIA symptoms (slurred speech, facial weakness, and trouble forming words) yesterday during prep for colonoscopy in h/o orthostatic syncope. BP stable today. Discussed importance of cholesterol control and blood pressure control - historically well controlled.  If true TIA which is likely, has failed full dose aspirin 325mg . Will change to aggrenox BID, will start atorvastatin 20mg  daily - in his case for plaque stabilization effect. Check head CT without contrast, update carotid ultrasound. Check BMP, CBC, TSH and dLDL today (nonfasting) Discussed in detail reasons to seen ER care if recurrent symptoms. Offered neurology appt - pt will consider. EKG today - NSR rate 60, normal axis, intervals, no acute ST/T changes ,?borderline RBBB.

## 2015-01-03 LAB — BASIC METABOLIC PANEL
BUN: 21 mg/dL (ref 6–23)
CHLORIDE: 105 meq/L (ref 96–112)
CO2: 28 meq/L (ref 19–32)
CREATININE: 1.13 mg/dL (ref 0.40–1.50)
Calcium: 8.8 mg/dL (ref 8.4–10.5)
GFR: 66.59 mL/min (ref >60.00)
Glucose, Bld: 93 mg/dL (ref 70–99)
POTASSIUM: 4 meq/L (ref 3.5–5.1)
SODIUM: 138 meq/L (ref 135–145)

## 2015-01-03 LAB — LDL CHOLESTEROL, DIRECT: Direct LDL: 73 mg/dL

## 2015-01-04 ENCOUNTER — Telehealth: Payer: Self-pay | Admitting: Family Medicine

## 2015-01-04 DIAGNOSIS — G459 Transient cerebral ischemic attack, unspecified: Secondary | ICD-10-CM

## 2015-01-04 DIAGNOSIS — I739 Peripheral vascular disease, unspecified: Secondary | ICD-10-CM

## 2015-01-04 DIAGNOSIS — I951 Orthostatic hypotension: Secondary | ICD-10-CM

## 2015-01-04 NOTE — Telephone Encounter (Signed)
Called the patient to schedule his carotid and he asked me to ask you to put in a Referral to Neurology. He wants to go to [facility] so please put that in your comments when the Referral is put in. Thank You

## 2015-01-05 NOTE — Telephone Encounter (Signed)
Referral placed.

## 2015-01-09 NOTE — Telephone Encounter (Signed)
Appt made with Dr Tomi Likens

## 2015-01-10 ENCOUNTER — Ambulatory Visit (HOSPITAL_COMMUNITY)
Admission: RE | Admit: 2015-01-10 | Discharge: 2015-01-10 | Disposition: A | Discharge status: Home / Self Care | Payer: Medicare Other | Source: Ambulatory Visit | Attending: Family Medicine | Admitting: Family Medicine

## 2015-01-10 DIAGNOSIS — I739 Peripheral vascular disease, unspecified: Secondary | ICD-10-CM | POA: Insufficient documentation

## 2015-01-10 DIAGNOSIS — I2510 Atherosclerotic heart disease of native coronary artery without angina pectoris: Secondary | ICD-10-CM | POA: Diagnosis not present

## 2015-01-10 DIAGNOSIS — I6523 Occlusion and stenosis of bilateral carotid arteries: Secondary | ICD-10-CM | POA: Diagnosis not present

## 2015-01-10 DIAGNOSIS — G459 Transient cerebral ischemic attack, unspecified: Secondary | ICD-10-CM | POA: Insufficient documentation

## 2015-01-10 DIAGNOSIS — I10 Essential (primary) hypertension: Secondary | ICD-10-CM | POA: Diagnosis not present

## 2015-01-15 ENCOUNTER — Telehealth: Payer: Self-pay | Admitting: Family Medicine

## 2015-01-15 NOTE — Telephone Encounter (Signed)
I don't think atorvastatin should cause lip sore But ok to hold atorvastatin until sore has healed, then rec restart it.

## 2015-01-15 NOTE — Telephone Encounter (Signed)
Pt called and believes he may be having a reaction, in the form of a lip sore, to his recently proscribed statin medication.  atorvastatin (LIPITOR) 20 MG tablet, prescribed 01/02/15.  Last office visit 01/02/15.  Best number to call pt is [phone]

## 2015-01-15 NOTE — Telephone Encounter (Signed)
Message left advising patient.  

## 2015-01-16 MED ORDER — PRAVASTATIN SODIUM 40 MG PO TABS: 40.0000 mg | ORAL_TABLET | Freq: Every day | ORAL | Status: DC

## 2015-01-16 NOTE — Telephone Encounter (Signed)
Spoke with patient. He said the symptoms all started when he started the medication and went away once he stopped. He says he is feeling fine now with no problems. He also said he never had a sore on his lip and has no idea where that came from. He wanted you to be aware so that if you wanted to change his meds you could.

## 2015-01-16 NOTE — Telephone Encounter (Signed)
Noted. Let's stop atorvastatin, start pravastatin 40mg  daily (less potent chol med).

## 2015-01-16 NOTE — Telephone Encounter (Signed)
Patient notified and verbalized understanding. 

## 2015-01-16 NOTE — Addendum Note (Signed)
Addended by: Ria Bush on: 01/16/2015 11:40 AM   Modules accepted: Orders, Medications

## 2015-01-16 NOTE — Telephone Encounter (Signed)
Pt called back , he does not think the sore on his lip is related to the starting of the atorvastatin.He stated that he does not have a sore on his lip at all.  He stopped taking meds  on Friday.  He was experiencing symptoms of memory trouble and confusion and tiredness and now they are gone   He is requesting cb cb [phone]

## 2015-02-02 ENCOUNTER — Encounter: Payer: Self-pay | Admitting: Family Medicine

## 2015-02-02 ENCOUNTER — Ambulatory Visit (INDEPENDENT_AMBULATORY_CARE_PROVIDER_SITE_OTHER): Payer: Medicare Other | Admitting: Family Medicine

## 2015-02-02 VITALS — BP 142/62 | HR 71 | Temp 97.5°F | Wt 155.0 lb

## 2015-02-02 DIAGNOSIS — I70219 Atherosclerosis of native arteries of extremities with intermittent claudication, unspecified extremity: Secondary | ICD-10-CM

## 2015-02-02 DIAGNOSIS — G459 Transient cerebral ischemic attack, unspecified: Secondary | ICD-10-CM | POA: Diagnosis not present

## 2015-02-02 MED ORDER — PRAVASTATIN SODIUM 20 MG PO TABS: 20.0000 mg | ORAL_TABLET | Freq: Every day | ORAL | Status: DC

## 2015-02-02 NOTE — Progress Notes (Signed)
BP 142/62 mmHg  Pulse 71  Temp(Src) 97.5 F (36.4 C) (Oral)  Wt 155 lb (70.308 kg)  SpO2 96%   CC: 1 mo f/u visit  Subjective:    Patient ID: Adam Clements, male    DOB: 11/22/1934, 80 y.o.   MRN: [id number]  HPI: Adam Clements is a 80 y.o. male presenting on 02/02/2015 for Follow-up   See prior note for details. Briefly, seen here 01/02/2015 with concern for TIA with confusion, slurred speech. sxs completely resolved. Aspirin 325mg  changed to aggrenox BID, and he was started on atorvastatin 20mg  daily. Reaction to atorvastatin - memory trouble, confusion, fatigue. Changed to pravastatin and tolerating better. Has appt with neurology Dr Tomi Likens 02/20/2015. No more episodes. He does notice more fatigue and more trouble with memory since presumed TIA 01/01/2015.   Head CT stable. Carotid US 1-39% stenosis bilaterally.   Strong fmhx CAD and stroke. MRI w/ and w/o contrast overall stable (10/2013) - checked for sensorineural hearing loss by Dr Thornell Mule. 2D echo stable dilated cardiomyopathy with ER 35-40% 11/2013 Carotid US stable B 0-39% ICA stenosis 03/2012 Known PAD with mod-severe disease by ABIs. On pletal for this.   Known orthostasis on fludrocortisone. Noticing some intermittent nocturnal polyuria, am nausea since statin started. Also with worsening memory and fatigue and "decreased vitality". Trouble getting motivated.   Denies tremor, trouble with sense of smell, no urine incontinence, no visual hallucinations. Denies easy bleeding or bruising.   Relevant past medical, surgical, family and social history reviewed and updated as indicated. Interim medical history since our last visit reviewed. Allergies and medications reviewed and updated. Current Outpatient Prescriptions on File Prior to Visit  Medication Sig  . Ascorbic Acid (VITAMIN C PO) Take 1 tablet by mouth 2 (two) times daily.   Marland Kitchen CALCIUM-VITAMIN D PO Take 1,200 mg by mouth daily.   . cilostazol (PLETAL) 50 MG tablet  take 1 tablet by mouth twice a day  . dipyridamole-aspirin (AGGRENOX) 200-25 MG 12hr capsule Take 1 capsule by mouth 2 (two) times daily.  . fludrocortisone (FLORINEF) 0.1 MG tablet Take 1 tablet (0.1 mg total) by mouth daily.  . psyllium (FIBER THERAPY) 0.52 G capsule Take 1 capsule by mouth 2 (two) times daily.    No current facility-administered medications on file prior to visit.    Review of Systems Per HPI unless specifically indicated in ROS section     Objective:    BP 142/62 mmHg  Pulse 71  Temp(Src) 97.5 F (36.4 C) (Oral)  Wt 155 lb (70.308 kg)  SpO2 96%  Wt Readings from Last 3 Encounters:  02/02/15 155 lb (70.308 kg)  01/02/15 153 lb 8 oz (69.627 kg)  11/22/14 154 lb 12.8 oz (70.217 kg)    Physical Exam  Constitutional: He appears well-developed and well-nourished. No distress.  HENT:  Mouth/Throat: Oropharynx is clear and moist. No oropharyngeal exudate.  Cardiovascular: Normal rate, regular rhythm, normal heart sounds and intact distal pulses.   No murmur heard. Pulmonary/Chest: Effort normal and breath sounds normal. No respiratory distress. He has no wheezes. He has no rales.  Musculoskeletal: He exhibits no edema.  Nursing note and vitals reviewed.  Results for orders placed or performed in visit on 01/02/15  TSH  Result Value Ref Range   TSH 1.18 0.35 - 4.50 uIU/mL  CBC with Differential/Platelet  Result Value Ref Range   WBC 4.0 4.0 - 10.5 K/uL   RBC 3.79 (L) 4.22 - 5.81 Mil/uL   Hemoglobin  12.1 (L) 13.0 - 17.0 g/dL   HCT 35.9 (L) 39.0 - 52.0 %   MCV 94.9 78.0 - 100.0 fl   MCHC 33.8 30.0 - 36.0 g/dL   RDW 13.6 11.5 - 15.5 %   Platelets 199.0 150.0 - 400.0 K/uL   Neutrophils Relative % 63.2 43.0 - 77.0 %   Lymphocytes Relative 23.0 12.0 - 46.0 %   Monocytes Relative 9.8 3.0 - 12.0 %   Eosinophils Relative 3.8 0.0 - 5.0 %   Basophils Relative 0.2 0.0 - 3.0 %   Neutro Abs 2.5 1.4 - 7.7 K/uL   Lymphs Abs 0.9 0.7 - 4.0 K/uL   Monocytes Absolute  0.4 0.1 - 1.0 K/uL   Eosinophils Absolute 0.2 0.0 - 0.7 K/uL   Basophils Absolute 0.0 0.0 - 0.1 K/uL  Basic metabolic panel  Result Value Ref Range   Sodium 138 135 - 145 mEq/L   Potassium 4.0 3.5 - 5.1 mEq/L   Chloride 105 96 - 112 mEq/L   CO2 28 19 - 32 mEq/L   Glucose, Bld 93 70 - 99 mg/dL   BUN 21 6 - 23 mg/dL   Creatinine, Ser 1.13 0.40 - 1.50 mg/dL   Calcium 8.8 8.4 - 10.5 mg/dL   GFR 66.59 >60.00 mL/min  LDL Cholesterol, Direct  Result Value Ref Range   Direct LDL 73.0 mg/dL      Assessment & Plan:   Problem List Items Addressed This Visit    TIA (transient ischemic attack) - Primary    See prior note for details. Tolerating aggrenox, pravastatin. Worried about possible med-related fatigue and memory impairment. Will decrease to 20mg  daily. Will appreciate neuro input on meds and ?dx.       Relevant Medications   pravastatin (PRAVACHOL) 20 MG tablet       Follow up plan: Return in about 3 months (around 05/05/2015), or as needed, for medicare wellness visit.

## 2015-02-02 NOTE — Assessment & Plan Note (Signed)
See prior note for details. Tolerating aggrenox, pravastatin. Worried about possible med-related fatigue and memory impairment. Will decrease to 20mg  daily. Will appreciate neuro input on meds and ?dx.

## 2015-02-02 NOTE — Patient Instructions (Addendum)
I think you are doing well. Continue current medicines. Cut pravastatin in half - take 20mg  daily. New dose at pharmacy. Return in 3 months for medicare wellness visit, fasting labs prior.

## 2015-02-05 ENCOUNTER — Telehealth: Payer: Self-pay | Admitting: Family Medicine

## 2015-02-05 ENCOUNTER — Emergency Department (HOSPITAL_COMMUNITY): Payer: Medicare Other

## 2015-02-05 ENCOUNTER — Encounter (HOSPITAL_COMMUNITY): Payer: Self-pay | Admitting: *Deleted

## 2015-02-05 ENCOUNTER — Emergency Department (HOSPITAL_COMMUNITY)
Admission: EM | Admit: 2015-02-05 | Discharge: 2015-02-05 | Disposition: A | Discharge status: Home / Self Care | Payer: Medicare Other | Attending: Emergency Medicine | Admitting: Emergency Medicine

## 2015-02-05 DIAGNOSIS — Z7952 Long term (current) use of systemic steroids: Secondary | ICD-10-CM | POA: Diagnosis not present

## 2015-02-05 DIAGNOSIS — R410 Disorientation, unspecified: Secondary | ICD-10-CM | POA: Diagnosis not present

## 2015-02-05 DIAGNOSIS — I951 Orthostatic hypotension: Secondary | ICD-10-CM | POA: Insufficient documentation

## 2015-02-05 DIAGNOSIS — Z8719 Personal history of other diseases of the digestive system: Secondary | ICD-10-CM | POA: Diagnosis not present

## 2015-02-05 DIAGNOSIS — R55 Syncope and collapse: Secondary | ICD-10-CM

## 2015-02-05 DIAGNOSIS — Z87438 Personal history of other diseases of male genital organs: Secondary | ICD-10-CM | POA: Diagnosis not present

## 2015-02-05 DIAGNOSIS — Z87891 Personal history of nicotine dependence: Secondary | ICD-10-CM | POA: Insufficient documentation

## 2015-02-05 DIAGNOSIS — I2510 Atherosclerotic heart disease of native coronary artery without angina pectoris: Secondary | ICD-10-CM | POA: Diagnosis not present

## 2015-02-05 DIAGNOSIS — R42 Dizziness and giddiness: Secondary | ICD-10-CM | POA: Diagnosis present

## 2015-02-05 DIAGNOSIS — R031 Nonspecific low blood-pressure reading: Secondary | ICD-10-CM | POA: Diagnosis not present

## 2015-02-05 DIAGNOSIS — Z85828 Personal history of other malignant neoplasm of skin: Secondary | ICD-10-CM | POA: Insufficient documentation

## 2015-02-05 DIAGNOSIS — Z79899 Other long term (current) drug therapy: Secondary | ICD-10-CM | POA: Diagnosis not present

## 2015-02-05 DIAGNOSIS — I10 Essential (primary) hypertension: Secondary | ICD-10-CM | POA: Insufficient documentation

## 2015-02-05 DIAGNOSIS — Z8679 Personal history of other diseases of the circulatory system: Secondary | ICD-10-CM

## 2015-02-05 DIAGNOSIS — Z862 Personal history of diseases of the blood and blood-forming organs and certain disorders involving the immune mechanism: Secondary | ICD-10-CM | POA: Diagnosis not present

## 2015-02-05 DIAGNOSIS — M1000 Idiopathic gout, unspecified site: Secondary | ICD-10-CM | POA: Diagnosis not present

## 2015-02-05 DIAGNOSIS — R51 Headache: Secondary | ICD-10-CM | POA: Diagnosis not present

## 2015-02-05 LAB — COMPREHENSIVE METABOLIC PANEL
ALBUMIN: 3.4 g/dL — AB (ref 3.5–5.0)
ALK PHOS: 48 U/L (ref 38–126)
ALT: 15 U/L — ABNORMAL LOW (ref 17–63)
ANION GAP: 6 (ref 5–15)
AST: 21 U/L (ref 15–41)
BILIRUBIN TOTAL: 0.8 mg/dL (ref 0.3–1.2)
BUN: 19 mg/dL (ref 6–20)
CALCIUM: 8.4 mg/dL — AB (ref 8.9–10.3)
CO2: 24 mmol/L (ref 22–32)
Chloride: 106 mmol/L (ref 101–111)
Creatinine, Ser: 1.05 mg/dL (ref 0.61–1.24)
GFR calc Af Amer: >60 mL/min (ref >60)
GLUCOSE: 92 mg/dL (ref 65–99)
Potassium: 3.6 mmol/L (ref 3.5–5.1)
Sodium: 136 mmol/L (ref 135–145)
TOTAL PROTEIN: 5.5 g/dL — AB (ref 6.5–8.1)

## 2015-02-05 LAB — CBC
HCT: 33.6 % — ABNORMAL LOW (ref 39.0–52.0)
HEMOGLOBIN: 11.6 g/dL — AB (ref 13.0–17.0)
MCH: 31.8 pg (ref 26.0–34.0)
MCHC: 34.5 g/dL (ref 30.0–36.0)
MCV: 92.1 fL (ref 78.0–100.0)
Platelets: 199 10*3/uL (ref 150–400)
RBC: 3.65 MIL/uL — ABNORMAL LOW (ref 4.22–5.81)
RDW: 12.7 % (ref 11.5–15.5)
WBC: 4.4 10*3/uL (ref 4.0–10.5)

## 2015-02-05 LAB — URINALYSIS, ROUTINE W REFLEX MICROSCOPIC
Bilirubin Urine: NEGATIVE
GLUCOSE, UA: NEGATIVE mg/dL
HGB URINE DIPSTICK: NEGATIVE
KETONES UR: NEGATIVE mg/dL
Leukocytes, UA: NEGATIVE
Nitrite: NEGATIVE
PROTEIN: NEGATIVE mg/dL
Specific Gravity, Urine: 1.014 (ref 1.005–1.030)
Urobilinogen, UA: 0.2 mg/dL (ref 0.0–1.0)
pH: 7 (ref 5.0–8.0)

## 2015-02-05 MED ORDER — SODIUM CHLORIDE 0.9 % IV BOLUS (SEPSIS)
1000.0000 mL | Freq: Once | INTRAVENOUS | Status: AC
  Administered 2015-02-05: 1000 mL via INTRAVENOUS

## 2015-02-05 NOTE — ED Notes (Signed)
Patient has had orthostatic changes in vital signs for months. EMS was called out today for a blood pressure check today. Patient was having some confusion and headache with drops in blood pressure.

## 2015-02-05 NOTE — Discharge Instructions (Signed)
You have been seen today for orthostatic hypotension. Your imaging and lab tests showed no abnormalities. Follow up with PCP as soon as possible on this issue for possible medication adjustment. Return to ED should symptoms worsen.

## 2015-02-05 NOTE — Telephone Encounter (Signed)
Patient Name: Adam Clements DOB: 07-09-1934 Initial Comment Caller states, husband had dizzy episode this morning, BP was low, 64/49 standing, sitting it did not register Nurse Assessment Nurse: Vallery Sa, RN, [place] Date/Time (Eastern Time): 02/05/2015 11:24:44 AM Confirm and document reason for call. If symptomatic, describe symptoms. ---Caller states her husband's blood pressure was 64/49 about 30 minutes ago and he is feeling dizzy. No severe breathing difficulty. Alert and responsive. Has the patient traveled out of the country within the last 30 days? ---No Does the patient have any new or worsening symptoms? ---Yes Will a triage be completed? ---Yes Related visit to physician within the last 2 weeks? ---Yes Does the PT have any chronic conditions? (i.e. diabetes, asthma, etc.) ---Yes List chronic conditions. ---Low blood pressure, Guidelines Guideline Title Affirmed Question Affirmed Notes Low Blood Pressure [id number] Systolic BP < 90 AND [id number] dizzy, lightheaded, or weak Final Disposition User Call EMS 911 Now Vallery Sa, RN, [place] Disagree/Comply: Agree. 911 on the way.

## 2015-02-05 NOTE — ED Provider Notes (Signed)
CSN: [id number]     Arrival date & time 02/05/15  1318 History   First MD Initiated Contact with Patient 02/05/15 1504     Chief Complaint  Patient presents with  . Hypotension     (Consider location/radiation/quality/duration/timing/severity/associated sxs/prior Treatment) HPI   Adam Clements is a 80 y.o. male, with a history of known neuro-cardiogenic orthostatic hypotension, presenting to the ED with lightheadedness, confusion, and occasional hypotension beginning upon waking at 6:20 AM this morning. Pt states he was trying to put in his hearing aids this morning and couldn't figure out how to do it, which is abnormal for him. Pt confirms that he knew he was feeling confused. Pt states he currently feels better but feels "thickheaded" and it's hard to think. Pt states the only medication change was pt pravastatin dosage being cut in half 3 days ago. Pt states he last felt normal yesterday. Pt also complains of feeling cold and "tense" in his muscles yesterday. Pt states he had an episode similar to this in July 2016, which spontaneous resolved and was ruled dehydration vs TIA. Pt has an appointment with a neurologist on Nov 29. Pt adds that he has had some diarrhea intermittently for months. Pt also states he has been getting up 4 or 5 times a night to urinate for the past 3 nights, which has made him drink less water.  Pt denies recent illness, fever, cough, N/V/C, chest pain, shortness of breath, or any other complaints.    Past Medical History  Diagnosis Date  . Allergic rhinitis   . Diverticulosis of colon     conoscopy 12/1993 (Dr. Amedeo Plenty) S/G divertics//colonoscopy L&R divertics (Dr. Amedeo Plenty) 11/08/2004  . PVC (premature ventricular contraction)   . Bradycardia   . Gout   . HTN (hypertension)   . PVD (peripheral vascular disease) ([place])     followed by Dr Trula Slade, treating medically (pletal and aspirin)  . Mitral regurgitation     Pulmonic regurgitation, with marked LA enlargement   . CAD (coronary artery disease)     nonobstructive  . Nonischemic cardiomyopathy ([facility])     EF 40%  . Arthritis     Gout  . BPH (benign prostatic hypertrophy)   . Chronic anemia 2013    h/o IDA, did not tolerate oral iron, latest normal iron studies, B12, folate  . RBBB longstanding  . Squamous cell carcinoma in situ of skin 2015    L ear   Past Surgical History  Procedure Laterality Date  . Appendectomy    . Dexa  2004    "shrunk 2 inches", WNL  . Abd/pelvic ct horseshoe kidney 10/10/04     Family History  Problem Relation Age of Onset  . Hypertension    . Heart disease Mother 80    Natural causes with CHF  . Hypertension Mother   . Hypertension Father   . Stroke Father     cerebral hemorrhage  . Hyperlipidemia Brother   . Heart disease Brother     before age 14  . Hypertension Brother   . Heart attack Brother    Social History  Substance Use Topics  . Smoking status: Former Smoker -- 2.00 packs/day    Types: Cigarettes    Quit date: 11/10/1982  . Smokeless tobacco: Former Systems developer  . Alcohol Use: 8.4 oz/week    10 Glasses of wine, 4 Cans of beer per week     Comment: 1-2 drinks/day    Review of Systems  Constitutional: Negative  for fever, chills and diaphoresis.  Cardiovascular: Negative for chest pain and leg swelling.  Gastrointestinal: Negative for nausea, vomiting, diarrhea and constipation.  Neurological: Positive for light-headedness. Negative for syncope, facial asymmetry, speech difficulty, weakness and numbness.  All other systems reviewed and are negative.     Allergies  Atorvastatin  Home Medications   Prior to Admission medications   Medication Sig Start Date End Date Taking? Authorizing Provider  Ascorbic Acid (VITAMIN C PO) Take 1 tablet by mouth 2 (two) times daily.    Yes Historical Provider, MD  CALCIUM-VITAMIN D PO Take 1,200 mg by mouth daily.    Yes Historical Provider, MD  cilostazol (PLETAL) 50 MG tablet take 1 tablet by mouth  twice a day 10/30/14  Yes Ria Bush, MD  dipyridamole-aspirin (AGGRENOX) 200-25 MG 12hr capsule Take 1 capsule by mouth 2 (two) times daily. 01/02/15  Yes Ria Bush, MD  fludrocortisone (FLORINEF) 0.1 MG tablet Take 1 tablet (0.1 mg total) by mouth daily. 09/29/14  Yes Ria Bush, MD  pravastatin (PRAVACHOL) 20 MG tablet Take 1 tablet (20 mg total) by mouth daily. 02/02/15  Yes Ria Bush, MD  psyllium (FIBER THERAPY) 0.52 G capsule Take 1 capsule by mouth 2 (two) times daily.    Yes Historical Provider, MD   BP 143/82 mmHg  Pulse 80  Temp(Src) 97.9 F (36.6 C) (Oral)  Resp 16  SpO2 96% Physical Exam  Constitutional: He is oriented to person, place, and time. He appears well-developed and well-nourished. No distress.  HENT:  Head: Normocephalic and atraumatic.  Mouth/Throat: Oropharynx is clear and moist.  Eyes: Conjunctivae and EOM are normal. Pupils are equal, round, and reactive to light.  Neck: Normal range of motion. Neck supple.  Cardiovascular: Normal rate, regular rhythm, normal heart sounds and intact distal pulses.   Pulmonary/Chest: Effort normal and breath sounds normal. No respiratory distress.  Abdominal: Soft. Bowel sounds are normal.  Musculoskeletal: Normal range of motion. He exhibits no edema or tenderness.  Lymphadenopathy:    He has no cervical adenopathy.  Neurological: He is alert and oriented to person, place, and time. He has normal reflexes.  No sensory deficits. Strength 5/5 in all extremities. Cranial nerves II-XII grossly intact.  Skin: Skin is warm and dry. He is not diaphoretic.  Nursing note and vitals reviewed.   ED Course  Procedures (including critical care time) Labs Review Labs Reviewed  COMPREHENSIVE METABOLIC PANEL - Abnormal; Notable for the following:    Calcium 8.4 (*)    Total Protein 5.5 (*)    Albumin 3.4 (*)    ALT 15 (*)    All other components within normal limits  CBC - Abnormal; Notable for the following:     RBC 3.65 (*)    Hemoglobin 11.6 (*)    HCT 33.6 (*)    All other components within normal limits  URINALYSIS, ROUTINE W REFLEX MICROSCOPIC (NOT AT [facility])    Imaging Review Ct Head Wo Contrast  02/05/2015  CLINICAL DATA:  Lightheaded.  Confusion. EXAM: CT HEAD WITHOUT CONTRAST TECHNIQUE: Contiguous axial images were obtained from the base of the skull through the vertex without intravenous contrast. COMPARISON:  01/02/2015 head CT. FINDINGS: No evidence of parenchymal hemorrhage or extra-axial fluid collection. No mass lesion, mass effect, or midline shift. No CT evidence of acute infarction. There is intracranial atherosclerosis and mild diffuse cerebral volume loss. Nonspecific mild stable subcortical and periventricular white matter hypodensity, most in keeping with chronic small vessel ischemic change. No  ventriculomegaly. Stable tiny osteoma in the left posterior sphenoid sinus. Partial opacification of the left frontal sinus. The visualized paranasal sinuses are otherwise essentially clear. The mastoid air cells are unopacified. No evidence of calvarial fracture. IMPRESSION: 1.  No evidence of acute intracranial abnormality. 2. Intracranial atherosclerosis, mild diffuse cerebral volume loss and mild chronic small vessel ischemic white matter change. 3. Left frontal sinusitis, likely chronic. Electronically Signed   By: Ilona Sorrel M.D.   On: 02/05/2015 16:47   I have personally reviewed and evaluated these images and lab results as part of my medical decision-making.   EKG Interpretation   Date/Time:  Monday February 05 2015 14:30:13 EST Ventricular Rate:  64 PR Interval:  168 QRS Duration: 163 QT Interval:  462 QTC Calculation: 477 R Axis:   62 Text Interpretation:  Sinus rhythm Ventricular premature complex Right  bundle branch block No significant change since last tracing Confirmed by  YAO  MD, DAVID ([id number]) on 02/05/2015 3:18:52 PM      MDM   Final diagnoses:  Syncope,  cardiogenic  Orthostatic hypotension  H/O orthostatic hypotension    Adam Clements presents with orthostatic hypotension, confusion, and lightheadedness since this morning.  Findings and plan of care discussed with Wandra Arthurs, MD.  No focal neurologic deficits, now or in pt description of his problems this morning. Suspect this may still be a manifestation of his known orthostatic hypotension problems. Dr. Darl Householder will send a message to patient's PCP and inform him so PCP can adjust pt Florinef. This plan of care and plan for discharge was communicated with the patient and the patient's wife at the bedside, both parties agreed plan and are comfortable with discharge.     Lorayne Bender, PA-C 02/05/15 [place] Yao, MD 02/06/15 [phone]

## 2015-02-05 NOTE — ED Notes (Signed)
Pt has urine at bedside

## 2015-02-05 NOTE — ED Notes (Signed)
IV site Left AC, Placed by prior to arrival - EMS. Taken out per order, Clean.

## 2015-02-06 NOTE — Telephone Encounter (Signed)
Spoke with patient. He checked BP while on the phone and  Results were: Sitting 115/68    Standing 138/86. He said he is feeling great and thinks the IVF really made a big difference.

## 2015-02-06 NOTE — Telephone Encounter (Signed)
Ok. notify me if bp ever stays <100/60.

## 2015-02-06 NOTE — Telephone Encounter (Signed)
Patient was advised to do same.

## 2015-02-06 NOTE — Telephone Encounter (Signed)
Seen at ER with hypotension, given IVF. plz call to f/u today - how are blood pressures running? If still low <100/60 rec increase fludrocortisone to 0.2mg  daily.

## 2015-02-07 ENCOUNTER — Ambulatory Visit (INDEPENDENT_AMBULATORY_CARE_PROVIDER_SITE_OTHER): Payer: Medicare Other | Admitting: Family Medicine

## 2015-02-07 ENCOUNTER — Ambulatory Visit (HOSPITAL_COMMUNITY)
Admission: RE | Admit: 2015-02-07 | Discharge: 2015-02-07 | Disposition: A | Discharge status: Home / Self Care | Payer: Medicare Other | Source: Ambulatory Visit | Attending: Family Medicine | Admitting: Family Medicine

## 2015-02-07 ENCOUNTER — Telehealth: Payer: Self-pay | Admitting: Family Medicine

## 2015-02-07 ENCOUNTER — Encounter: Payer: Self-pay | Admitting: Family Medicine

## 2015-02-07 VITALS — BP 156/86 | HR 65 | Temp 97.4°F | Ht 69.0 in | Wt 154.8 lb

## 2015-02-07 DIAGNOSIS — G459 Transient cerebral ischemic attack, unspecified: Secondary | ICD-10-CM

## 2015-02-07 DIAGNOSIS — I70219 Atherosclerosis of native arteries of extremities with intermittent claudication, unspecified extremity: Secondary | ICD-10-CM | POA: Diagnosis not present

## 2015-02-07 DIAGNOSIS — G319 Degenerative disease of nervous system, unspecified: Secondary | ICD-10-CM | POA: Diagnosis not present

## 2015-02-07 NOTE — Patient Instructions (Signed)
Please stop up front for referral for MRI/MRA  Keep your follow up appt with [name] and also with neurology  Let us know if you have more episodes Stay hydrated Continue current medicines

## 2015-02-07 NOTE — Progress Notes (Signed)
Pre visit review using our clinic review tool, if applicable. No additional management support is needed unless otherwise documented below in the visit note. 

## 2015-02-07 NOTE — Telephone Encounter (Signed)
Spoke with pt; not having a problem now and scheduled appt to see Dr Glori Bickers 02/07/15 at 11:45 am. Pt has someone who will drive him to [facility] appt. If pt condition worsens prior to appt pt will call 911 and go to ED.

## 2015-02-07 NOTE — Progress Notes (Signed)
Subjective:    Patient ID: Adam Clements, male    DOB: 24-Jan-1935, 80 y.o.   MRN: [id number]  HPI Here for f/u of MS change  Pt has hx of neurocardiogenic hypotension and syncope  Was in ED for confusion and hypotension on 11/14 (felt confused)  Had CT  Ct Head Wo Contrast  02/05/2015 CLINICAL DATA: Lightheaded. Confusion. EXAM: CT HEAD WITHOUT CONTRAST TECHNIQUE: Contiguous axial images were obtained from the base of the skull through the vertex without intravenous contrast. COMPARISON: 01/02/2015 head CT. FINDINGS: No evidence of parenchymal hemorrhage or extra-axial fluid collection. No mass lesion, mass effect, or midline shift. No CT evidence of acute infarction. There is intracranial atherosclerosis and mild diffuse cerebral volume loss. Nonspecific mild stable subcortical and periventricular white matter hypodensity, most in keeping with chronic small vessel ischemic change. No ventriculomegaly. Stable tiny osteoma in the left posterior sphenoid sinus. Partial opacification of the left frontal sinus. The visualized paranasal sinuses are otherwise essentially clear. The mastoid air cells are unopacified. No evidence of calvarial fracture. IMPRESSION: 1. No evidence of acute intracranial abnormality. 2. Intracranial atherosclerosis, mild diffuse cerebral volume loss and mild chronic small vessel ischemic white matter change. 3. Left frontal sinusitis, likely chronic.   EKG showed rate of 64 and RBBB  Admission on 02/05/2015, Discharged on 02/05/2015  Component Date Value Ref Range Status  . Sodium 02/05/2015 136  135 - 145 mmol/L Final  . Potassium 02/05/2015 3.6  3.5 - 5.1 mmol/L Final  . Chloride 02/05/2015 106  101 - 111 mmol/L Final  . CO2 02/05/2015 24  22 - 32 mmol/L Final  . Glucose, Bld 02/05/2015 92  65 - 99 mg/dL Final  . BUN 02/05/2015 19  6 - 20 mg/dL Final  . Creatinine, Ser 02/05/2015 1.05  0.61 - 1.24 mg/dL Final  . Calcium 02/05/2015 8.4* 8.9 - 10.3 mg/dL Final   . Total Protein 02/05/2015 5.5* 6.5 - 8.1 g/dL Final  . Albumin 02/05/2015 3.4* 3.5 - 5.0 g/dL Final  . AST 02/05/2015 21  15 - 41 U/L Final  . ALT 02/05/2015 15* 17 - 63 U/L Final  . Alkaline Phosphatase 02/05/2015 48  38 - 126 U/L Final  . Total Bilirubin 02/05/2015 0.8  0.3 - 1.2 mg/dL Final  . GFR calc non Af Amer 02/05/2015 >60  >60 mL/min Final  . GFR calc Af Amer 02/05/2015 >60  >60 mL/min Final   Comment: (NOTE) The eGFR has been calculated using the CKD EPI equation. This calculation has not been validated in all clinical situations. eGFR's persistently <60 mL/min signify possible Chronic Kidney Disease.   . Anion gap 02/05/2015 6  5 - 15 Final  . WBC 02/05/2015 4.4  4.0 - 10.5 K/uL Final  . RBC 02/05/2015 3.65* 4.22 - 5.81 MIL/uL Final  . Hemoglobin 02/05/2015 11.6* 13.0 - 17.0 g/dL Final  . HCT 02/05/2015 33.6* 39.0 - 52.0 % Final  . MCV 02/05/2015 92.1  78.0 - 100.0 fL Final  . MCH 02/05/2015 31.8  26.0 - 34.0 pg Final  . MCHC 02/05/2015 34.5  30.0 - 36.0 g/dL Final  . RDW 02/05/2015 12.7  11.5 - 15.5 % Final  . Platelets 02/05/2015 199  150 - 400 K/uL Final  . Color, Urine 02/05/2015 YELLOW  YELLOW Final  . APPearance 02/05/2015 CLEAR  CLEAR Final  . Specific Gravity, Urine 02/05/2015 1.014  1.005 - 1.030 Final  . pH 02/05/2015 7.0  5.0 - 8.0 Final  . Glucose, UA  02/05/2015 NEGATIVE  NEGATIVE mg/dL Final  . Hgb urine dipstick 02/05/2015 NEGATIVE  NEGATIVE Final  . Bilirubin Urine 02/05/2015 NEGATIVE  NEGATIVE Final  . Ketones, ur 02/05/2015 NEGATIVE  NEGATIVE mg/dL Final  . Protein, ur 02/05/2015 NEGATIVE  NEGATIVE mg/dL Final  . Urobilinogen, UA 02/05/2015 0.2  0.0 - 1.0 mg/dL Final  . Nitrite 02/05/2015 NEGATIVE  NEGATIVE Final  . Leukocytes, UA 02/05/2015 NEGATIVE  NEGATIVE Final   MICROSCOPIC NOT DONE ON URINES WITH NEGATIVE PROTEIN, BLOOD, LEUKOCYTES, NITRITE, OR GLUCOSE <1000 mg/dL.    BP Readings from Last 3 Encounters:  02/07/15 156/86  02/05/15 174/84    02/02/15 142/62    Had another episode last night at 7:30- read a paragraph 5 times and could not understand it  Then went to speak to his wife and could not formulate words  (this has happened in the past) Mild headache /not bad Some dizziness  This lasted less than 10 minutes (in the past up to an hour)   Takes florinef to keep his bp up   Does not see any specialists   Feels fine right now   Used to have this happen yearly- now more often  Prev dx as TIAs  Has had 2D echo and carotid dopplers -no cause found ? About dehydration in the past - he has made an attempt to drink more water and then he forgets after a while   Hx of chronic iron def   Patient Active Problem List   Diagnosis Date Noted  . TIA (transient ischemic attack) 01/02/2015  . Syncope 12/27/2013  . Advanced care planning/counseling discussion 12/12/2013  . Orthostatic hypotension 06/03/2013  . Chronic venous insufficiency 05/26/2013  . PAD (peripheral artery disease) ([place]) 08/09/2012  . Medicare annual wellness visit, subsequent 11/10/2011  . IDA (iron deficiency anemia) 09/18/2011  . Weight loss 09/12/2011  . Dizziness 03/12/2011  . Chronic systolic dysfunction of left ventricle 07/18/2010  . PREMATURE VENTRICULAR CONTRACTIONS, FREQUENT 11/15/2008  . MITRAL REGURGITATION 08/25/2008  . BICEPS TENDON RUPTURE, LEFT 10/01/2007  . PALPITATIONS 12/29/2006  . GOUT 10/07/2006  . ALLERGIC RHINITIS 10/07/2006  . DIVERTICULOSIS, COLON 10/07/2006  . HYPRTRPHY PROSTATE BNG W/O URINARY OBST/LUTS 10/07/2006  . ROSACEA 10/07/2006   Past Medical History  Diagnosis Date  . Allergic rhinitis   . Diverticulosis of colon     conoscopy 12/1993 (Dr. Amedeo Plenty) S/G divertics//colonoscopy L&R divertics (Dr. Amedeo Plenty) 11/08/2004  . PVC (premature ventricular contraction)   . Bradycardia   . Gout   . HTN (hypertension)   . PVD (peripheral vascular disease) ([place])     followed by Dr Trula Slade, treating medically (pletal and  aspirin)  . Mitral regurgitation     Pulmonic regurgitation, with marked LA enlargement  . CAD (coronary artery disease)     nonobstructive  . Nonischemic cardiomyopathy ([facility])     EF 40%  . Arthritis     Gout  . BPH (benign prostatic hypertrophy)   . Chronic anemia 2013    h/o IDA, did not tolerate oral iron, latest normal iron studies, B12, folate  . RBBB longstanding  . Squamous cell carcinoma in situ of skin 2015    L ear   Past Surgical History  Procedure Laterality Date  . Appendectomy    . Dexa  2004    "shrunk 2 inches", WNL  . Abd/pelvic ct horseshoe kidney 10/10/04     Social History  Substance Use Topics  . Smoking status: Former Smoker -- 2.00 packs/day  Types: Cigarettes    Quit date: 11/10/1982  . Smokeless tobacco: Former Systems developer  . Alcohol Use: 8.4 oz/week    10 Glasses of wine, 4 Cans of beer per week     Comment: 1-2 drinks/day   Family History  Problem Relation Age of Onset  . Hypertension    . Heart disease Mother 11    Natural causes with CHF  . Hypertension Mother   . Hypertension Father   . Stroke Father     cerebral hemorrhage  . Hyperlipidemia Brother   . Heart disease Brother     before age 55  . Hypertension Brother   . Heart attack Brother    Allergies  Allergen Reactions  . Atorvastatin Other (See Comments)    Memory trouble, confusion, fatigue   Current Outpatient Prescriptions on File Prior to Visit  Medication Sig Dispense Refill  . Ascorbic Acid (VITAMIN C PO) Take 1 tablet by mouth 2 (two) times daily.     Marland Kitchen CALCIUM-VITAMIN D PO Take 1,200 mg by mouth daily.     . cilostazol (PLETAL) 50 MG tablet take 1 tablet by mouth twice a day 60 tablet 3  . dipyridamole-aspirin (AGGRENOX) 200-25 MG 12hr capsule Take 1 capsule by mouth 2 (two) times daily. 60 capsule 3  . fludrocortisone (FLORINEF) 0.1 MG tablet Take 1 tablet (0.1 mg total) by mouth daily. 30 tablet 6  . pravastatin (PRAVACHOL) 20 MG tablet Take 1 tablet (20 mg total) by  mouth daily. 30 tablet 3  . psyllium (FIBER THERAPY) 0.52 G capsule Take 1 capsule by mouth 2 (two) times daily.      No current facility-administered medications on file prior to visit.    Review of Systems Review of Systems  Constitutional: Negative for fever, appetite change, fatigue and unexpected weight change.  Eyes: Negative for pain and visual disturbance.  Respiratory: Negative for cough and shortness of breath.   Cardiovascular: Negative for cp or palpitations    Gastrointestinal: Negative for nausea, diarrhea and constipation.  Genitourinary: Negative for urgency and frequency.  Skin: Negative for pallor or rash   Neurological: Negative for weakness, light-headedness, numbness and headaches. pos for MS change and speech problem last night that is now resolved) Hematological: Negative for adenopathy. Does not bruise/bleed easily.  Psychiatric/Behavioral: Negative for dysphoric mood. The patient is not nervous/anxious.         Objective:   Physical Exam  Constitutional: He is oriented to person, place, and time. He appears well-developed and well-nourished. No distress.  Well appearing   HENT:  Head: Normocephalic and atraumatic.  Right Ear: External ear normal.  Left Ear: External ear normal.  Nose: Nose normal.  Mouth/Throat: Oropharynx is clear and moist. No oropharyngeal exudate.  No sinus tenderness No temporal tenderness  No TMJ tenderness  Eyes: Conjunctivae and EOM are normal. Pupils are equal, round, and reactive to light. Right eye exhibits no discharge. Left eye exhibits no discharge. No scleral icterus.  No nystagmus  Neck: Normal range of motion and full passive range of motion without pain. Neck supple. No JVD present. Carotid bruit is not present. No tracheal deviation present. No thyromegaly present.  Cardiovascular: Normal rate, regular rhythm and normal heart sounds.   No murmur heard. Pulmonary/Chest: Effort normal and breath sounds normal. No  respiratory distress. He has no wheezes. He has no rales.  Abdominal: Soft. Bowel sounds are normal. He exhibits no distension and no mass. There is no tenderness.  Musculoskeletal: He exhibits no  edema or tenderness.  Lymphadenopathy:    He has no cervical adenopathy.  Neurological: He is alert and oriented to person, place, and time. He has normal strength and normal reflexes. He displays no atrophy and no tremor. No cranial nerve deficit or sensory deficit. He exhibits normal muscle tone. He displays a negative Romberg sign. Coordination and gait normal.  No focal cerebellar signs   Skin: Skin is warm and dry. No rash noted. No pallor.  Psychiatric: He has a normal mood and affect. His behavior is normal. Thought content normal.  Pleasant and talkative           Assessment & Plan:   Problem List Items Addressed This Visit      Cardiovascular and Mediastinum   TIA (transient ischemic attack) - Primary    Symptoms from last night sound like TIA  Rev records and hx of hypotension as well   ( BP Readings from Last 3 Encounters:  02/07/15 156/86  02/05/15 174/84  02/02/15 142/62   no orthostatic changes today Rev CT Will order MRI/MRA today  Neurology f/u upcoming Rev s/s of CVA- and inst to call 911 for symptoms  Continue current medication s        Relevant Orders   MR MRA Head/Brain Wo Cm (Completed)   MR Brain Wo Contrast (Completed)

## 2015-02-07 NOTE — Telephone Encounter (Signed)
[facility] Patient Name: Adam Clements DOB: [birth year] Initial Comment Caller states had another episode where he couldn't speak last night about 7:30; had begun to read a book; read one paragraph 4x and could not understand it; had one Monday; Nurse Assessment Nurse: Markus Daft, RN, Sherre Poot Date/Time (Eastern Time): 02/07/2015 9:42:52 AM Confirm and document reason for call. If symptomatic, describe symptoms. ---Caller states had another episode where he couldn't speak last night about 7:30 pm. He had begun to read a book. He read one paragraph 4 times, and could not understand it. He's had memory issues also. - He had this on Monday. At that time, his wife called 911 and he was taken to ER for observation. Thought he may have been dehydrated. In the past, h/o TIA. Has the patient traveled out of the country within the last 30 days? ---Not Applicable Does the patient have any new or worsening symptoms? ---Yes Will a triage be completed? ---Yes Related visit to physician within the last 2 weeks? ---Yes Does the PT have any chronic conditions? (i.e. diabetes, asthma, etc.) ---Yes List chronic conditions. ---low BP - on Fludrocortisone Guidelines Guideline Title Affirmed Question Affirmed Notes Neurologic Deficit [1] Loss of speech or garbled speech AND [2] sudden onset AND [3] brief (now gone) Final Disposition User Go to ED Now (or PCP triage) Markus Daft, RN, Sherre Poot Comments Pt would like to see his PCP. RN contacted office to see about working into see his MD today. Melissa - will see about this. Warm conf. pt to her.  He is aware that he needs to be seen within the next HR. Referrals REFERRED TO PCP OFFICE Disagree/Comply: Comply

## 2015-02-07 NOTE — Telephone Encounter (Signed)
Pt was seen

## 2015-02-08 NOTE — Assessment & Plan Note (Signed)
Symptoms from last night sound like TIA  Rev records and hx of hypotension as well   ( BP Readings from Last 3 Encounters:  02/07/15 156/86  02/05/15 174/84  02/02/15 142/62   no orthostatic changes today Rev CT Will order MRI/MRA today  Neurology f/u upcoming Rev s/s of CVA- and inst to call 911 for symptoms  Continue current medication s

## 2015-02-13 ENCOUNTER — Encounter: Payer: Self-pay | Admitting: Family Medicine

## 2015-02-13 ENCOUNTER — Ambulatory Visit (INDEPENDENT_AMBULATORY_CARE_PROVIDER_SITE_OTHER): Payer: Medicare Other | Admitting: Family Medicine

## 2015-02-13 VITALS — BP 136/78 | HR 72 | Temp 97.3°F | Wt 153.8 lb

## 2015-02-13 DIAGNOSIS — G459 Transient cerebral ischemic attack, unspecified: Secondary | ICD-10-CM

## 2015-02-13 DIAGNOSIS — I70219 Atherosclerosis of native arteries of extremities with intermittent claudication, unspecified extremity: Secondary | ICD-10-CM

## 2015-02-13 DIAGNOSIS — H2513 Age-related nuclear cataract, bilateral: Secondary | ICD-10-CM | POA: Diagnosis not present

## 2015-02-13 NOTE — Progress Notes (Signed)
BP 136/78 mmHg  Pulse 72  Temp(Src) 97.3 F (36.3 C) (Oral)  Wt 153 lb 12.8 oz (69.763 kg)   CC: hosp f/u visit  Subjective:    Patient ID: Adam Clements, male    DOB: Dec 19, 1934, 80 y.o.   MRN: [id number]  HPI: [name] is a 80 y.o. male presenting on 02/13/2015 for Hospitalization Follow-up   See recent notes and ER visit for details. Briefly, dx with TIA 01/02/2015 with confusion, slurred speech. sxs <24 hours, with complete resolution. Aspirin 325mg  changed to aggrenox BID and atorvastatin 20mg  daily was started. He did not tolerate atorvastatin so this was changed to pravastatin. Has appt pending with neurology ([place] 02/20/2015).   Seen at ER 02/05/2015 with confusion, head CT stable with chronic L frontal sinusitis. Seen in f/u by Dr Glori Bickers 02/07/2015 with recurrent episodes of confusion (unable to comprehend book he was reading, unable to formulate words to wife). This lasted 10 minutes.   MRI/MRA ordered - WNL.  Head CT stable.  Carotid US 1-39% bilateral stenosis. 2D echo stable dilated cardiomyopathy with ER 35-40% 11/2013 Known PAD with mod-severe disease by ABIs. On pletal for this.  Known longstanding orthostatic hypotension on fludrocortisone.   No more episodes. Pt endorses worsening memory over the past month (since aggrenox and pravastatin were started). Now he needs to make lists to remember chores and errands. Wife has noticed as well.   Denies increased stiffness, anosmia, tremors, mood changes, behavioral changes, urinary incontinence, or hallucinations. Thinks balance is slightly off.  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 Recommended against this.   Strong fmhx CAD and stroke. No fmhx dementia.   Relevant past medical, surgical, family and social history reviewed and updated as indicated. Interim medical history since our last visit reviewed. Allergies and medications  reviewed and updated. Current Outpatient Prescriptions on File Prior to Visit  Medication Sig  . Ascorbic Acid (VITAMIN C PO) Take 1 tablet by mouth 2 (two) times daily.   Marland Kitchen CALCIUM-VITAMIN D PO Take 1,200 mg by mouth daily.   . cilostazol (PLETAL) 50 MG tablet take 1 tablet by mouth twice a day  . dipyridamole-aspirin (AGGRENOX) 200-25 MG 12hr capsule Take 1 capsule by mouth 2 (two) times daily.  . fludrocortisone (FLORINEF) 0.1 MG tablet Take 1 tablet (0.1 mg total) by mouth daily.  . pravastatin (PRAVACHOL) 20 MG tablet Take 1 tablet (20 mg total) by mouth daily.  . psyllium (FIBER THERAPY) 0.52 G capsule Take 1 capsule by mouth 2 (two) times daily.    No current facility-administered medications on file prior to visit.   Past Medical History  Diagnosis Date  . Allergic rhinitis   . Diverticulosis of colon     conoscopy 12/1993 (Dr. Amedeo Plenty) S/G divertics//colonoscopy L&R divertics (Dr. Amedeo Plenty) 11/08/2004  . PVC (premature ventricular contraction)   . Bradycardia   . Gout   . HTN (hypertension)   . PVD (peripheral vascular disease) ([place])     followed by Dr Trula Slade, treating medically (pletal and aspirin)  . Mitral regurgitation     Pulmonic regurgitation, with marked LA enlargement  . CAD (coronary artery disease)     nonobstructive  . Nonischemic cardiomyopathy ([facility])     EF 40%  . Arthritis     Gout  . BPH (benign prostatic hypertrophy)   . Chronic anemia 2013    h/o IDA, did not tolerate oral iron, latest normal  iron studies, B12, folate  . RBBB longstanding  . Squamous cell carcinoma in situ of skin 2015    L ear    Family History  Problem Relation Age of Onset  . Hypertension    . Heart disease Mother 61    Natural causes with CHF  . Hypertension Mother   . Hypertension Father   . Stroke Father     cerebral hemorrhage  . Hyperlipidemia Brother   . Heart disease Brother     before age 58  . Hypertension Brother   . Heart attack Brother    Review of  Systems Per HPI unless specifically indicated in ROS section     Objective:    BP 136/78 mmHg  Pulse 72  Temp(Src) 97.3 F (36.3 C) (Oral)  Wt 153 lb 12.8 oz (69.763 kg)  Wt Readings from Last 3 Encounters:  02/13/15 153 lb 12.8 oz (69.763 kg)  02/07/15 154 lb 12 oz (70.194 kg)  02/02/15 155 lb (70.308 kg)    Physical Exam  Constitutional: He is oriented to person, place, and time. He appears well-developed and well-nourished. No distress.  HENT:  Mouth/Throat: Oropharynx is clear and moist. No oropharyngeal exudate.  Eyes: Conjunctivae and EOM are normal. Pupils are equal, round, and reactive to light. No scleral icterus.  Neck: Normal range of motion. Neck supple.  Cardiovascular: Normal rate, regular rhythm, normal heart sounds and intact distal pulses.   No murmur heard. Pulmonary/Chest: Effort normal and breath sounds normal. No respiratory distress. He has no wheezes. He has no rales.  Musculoskeletal: He exhibits no edema.  Lymphadenopathy:    He has no cervical adenopathy.  Neurological: He is alert and oriented to person, place, and time. He has normal strength. No sensory deficit. He displays a negative Romberg sign. Coordination and gait normal.  FTN intact 5/5 strength BUE and BLE No pronator drift Intact, sure gait  No dysdiadochokinesia   Skin: Skin is warm and dry. No rash noted.  Psychiatric: He has a normal mood and affect.  Nursing note and vitals reviewed.  Results for orders placed or performed during the hospital encounter of 02/05/15  Comprehensive metabolic panel  Result Value Ref Range   Sodium 136 135 - 145 mmol/L   Potassium 3.6 3.5 - 5.1 mmol/L   Chloride 106 101 - 111 mmol/L   CO2 24 22 - 32 mmol/L   Glucose, Bld 92 65 - 99 mg/dL   BUN 19 6 - 20 mg/dL   Creatinine, Ser 1.05 0.61 - 1.24 mg/dL   Calcium 8.4 (L) 8.9 - 10.3 mg/dL   Total Protein 5.5 (L) 6.5 - 8.1 g/dL   Albumin 3.4 (L) 3.5 - 5.0 g/dL   AST 21 15 - 41 U/L   ALT 15 (L) 17 - 63  U/L   Alkaline Phosphatase 48 38 - 126 U/L   Total Bilirubin 0.8 0.3 - 1.2 mg/dL   GFR calc non Af Amer >60 >60 mL/min   GFR calc Af Amer >60 >60 mL/min   Anion gap 6 5 - 15  CBC  Result Value Ref Range   WBC 4.4 4.0 - 10.5 K/uL   RBC 3.65 (L) 4.22 - 5.81 MIL/uL   Hemoglobin 11.6 (L) 13.0 - 17.0 g/dL   HCT 33.6 (L) 39.0 - 52.0 %   MCV 92.1 78.0 - 100.0 fL   MCH 31.8 26.0 - 34.0 pg   MCHC 34.5 30.0 - 36.0 g/dL   RDW 12.7 11.5 - 15.5 %  Platelets 199 150 - 400 K/uL  Urinalysis, Routine w reflex microscopic (not at [facility])  Result Value Ref Range   Color, Urine YELLOW YELLOW   APPearance CLEAR CLEAR   Specific Gravity, Urine 1.014 1.005 - 1.030   pH 7.0 5.0 - 8.0   Glucose, UA NEGATIVE NEGATIVE mg/dL   Hgb urine dipstick NEGATIVE NEGATIVE   Bilirubin Urine NEGATIVE NEGATIVE   Ketones, ur NEGATIVE NEGATIVE mg/dL   Protein, ur NEGATIVE NEGATIVE mg/dL   Urobilinogen, UA 0.2 0.0 - 1.0 mg/dL   Nitrite NEGATIVE NEGATIVE   Leukocytes, UA NEGATIVE NEGATIVE   MRI HEAD WITHOUT CONTRAST MRA HEAD WITHOUT CONTRAST TECHNIQUE: Multiplanar, multiecho pulse sequences of the brain and surrounding structures were obtained without intravenous contrast. Angiographic images of the head were obtained using MRA technique without contrast.  COMPARISON: CT head 02/05/2015  FINDINGS: MRI HEAD FINDINGS  Negative for acute infarct.  Moderate atrophy. Mild chronic microvascular ischemic change in the cerebral white matter. Brainstem and basal ganglia normal. Cerebellum intact.  Pituitary normal in size. Cervical medullary junction normal.  Negative for intracranial hemorrhage.  Negative for mass or edema.  Mucosal edema throughout the paranasal sinuses without air-fluid level. Pituitary not enlarged.  MRA HEAD FINDINGS  Both vertebral arteries patent to the basilar. PICA patent bilaterally. Basilar widely patent. Superior cerebellar and posterior cerebral arteries patent  bilaterally.  Mild atherosclerotic disease in the left cavernous carotid without significant carotid stenosis bilaterally. Hypoplastic left A1 segment. Both anterior cerebral arteries supplied from the right and are patent. Middle cerebral arteries patent bilaterally.  Negative for cerebral aneurysm.  IMPRESSION: Negative for acute infarct. Atrophy and mild chronic microvascular ischemia Negative MRA head  Electronically Signed  By: Franchot Gallo M.D.  On: 02/07/2015 16:04    Assessment & Plan:  Over 25 minutes were spent face-to-face with the patient during this encounter and >50% of that time was spent on counseling and coordination of care  Problem List Items Addressed This Visit    TIA (transient ischemic attack) - Primary    No episode in the past week. Reviewed history and recent imaging with patient.  ?TIA vs seizures vs other (like lewy body dementia).  Pt has appt next week with neuro - will appreciate input on etiology and management, ?aggrenox vs plavix.  In interim, rec avoid operating heavy machinery or working at elevation. Pt agrees.          Follow up plan: Return if symptoms worsen or fail to improve.

## 2015-02-13 NOTE — Assessment & Plan Note (Addendum)
No episode in the past week. Reviewed history and recent imaging with patient.  ?TIA vs seizures vs other (like lewy body dementia).  Pt has appt next week with neuro - will appreciate input on etiology and management, ?aggrenox vs plavix.  In interim, rec avoid operating heavy machinery or working at elevation. Pt agrees.

## 2015-02-13 NOTE — Patient Instructions (Signed)
I will send today's note to Dr Loretta Plume.  ?mini-strokes vs mini seizures vs memory trouble?.  I'm glad no episodes in the last week.  I recommend avoiding operating any heavy machinery or ladder climbing.

## 2015-02-20 ENCOUNTER — Other Ambulatory Visit (INDEPENDENT_AMBULATORY_CARE_PROVIDER_SITE_OTHER): Payer: Medicare Other

## 2015-02-20 ENCOUNTER — Encounter: Payer: Self-pay | Admitting: Neurology

## 2015-02-20 ENCOUNTER — Ambulatory Visit (INDEPENDENT_AMBULATORY_CARE_PROVIDER_SITE_OTHER): Payer: Medicare Other | Admitting: Neurology

## 2015-02-20 VITALS — BP 136/68 | HR 74 | Ht 69.0 in | Wt 156.0 lb

## 2015-02-20 DIAGNOSIS — G903 Multi-system degeneration of the autonomic nervous system: Secondary | ICD-10-CM | POA: Diagnosis not present

## 2015-02-20 DIAGNOSIS — G459 Transient cerebral ischemic attack, unspecified: Secondary | ICD-10-CM

## 2015-02-20 DIAGNOSIS — R413 Other amnesia: Secondary | ICD-10-CM | POA: Diagnosis not present

## 2015-02-20 DIAGNOSIS — I70219 Atherosclerosis of native arteries of extremities with intermittent claudication, unspecified extremity: Secondary | ICD-10-CM

## 2015-02-20 DIAGNOSIS — F05 Delirium due to known physiological condition: Secondary | ICD-10-CM | POA: Diagnosis not present

## 2015-02-20 LAB — VITAMIN B12: Vitamin B-12: 243 pg/mL (ref 211–911)

## 2015-02-20 NOTE — Patient Instructions (Signed)
Continue Aggrenox We will schedule EEG to check your brain waves We will check B12 to evaluate for cause of memory problems.  If it looks okay, we will refer you for neuropsychological testing Follow up in 3 months.

## 2015-02-20 NOTE — Progress Notes (Signed)
NEUROLOGY CONSULTATION NOTE  SAMYOG MOULTRIE MRN: [id number] DOB: 01-Mar-1935  Referring provider: Dr. Danise Mina Primary care provider: Dr. Danise Mina  Reason for consult:  TIA  HISTORY OF PRESENT ILLNESS: Adam Clements is a 80 year old right-handed male with neuro-cardiogenic orthostatic hypotension and PAD  who presents for evaluation of TIA.  History obtained by patient, ED note and PCP notes.  Labs, EKG and imaging of CT, MRI and MRA of head reviewed.  He was diagnosed with neurocardiogenic orthostatic hypotension after experiencing several syncopal events over the past few months.  He would often pass out after period of exertion or frequent voiding.   He has been treated with Florinef.  Since the summer, he has had recurrent episodes of difficulty with speech output.  They seem to correlate with low blood pressure.  He was On 01/01/15, he developed confusion.  He was preparing for a colonoscopy and had trouble understanding the report.  He also had trouble with verbal output and was slurring his speech.  The following morning, he had right-sided headache.  Blood pressure was found to be 64/49.  EKG showed NSR of 60 bpm.  He was treated with IV fluids and felt better.  His PCP changed his ASA 325mg  to Aggrenox for suspected TIA.  LDL was 73.  TSH 1.18.  He was started on atorvastatin 20mg  daily.  CT of head showed no acute intracranial process.  Carotid doppler showed no hemodynamically significant ICA stenosis.  On the morning of 02/05/15, he developed confusion.  He had trouble figuring out how to put in his hearing aids.  He didn't feel well.  He presented to the ED, where CT of head showed chronic left frontal sinusitis but no acute intracranial process.  UA was negative.  Manifestation of his orthostatic hypotension was suspected and it was suggested that his Florinef should be adjusted.  He followed up with his PCP.  MRI and MRA of head was performed on 02/07/15 showed no acute infarct or  intracranial stenosis.  He has cardiomyopathy with a EF of 35-40%.   He also reports gradually worsening short-term memory over the past 4 month as well.  He reports that he needs to constantly write things down.  When he is distracted for a moment, he will forget what he needed to do.  He denies problems performing everyday tasks, disorientation while driving, or remembering names or faces.  He denies family history of dementia.  TSH was 1.18.  PAST MEDICAL HISTORY: Past Medical History  Diagnosis Date  . Allergic rhinitis   . Diverticulosis of colon     conoscopy 12/1993 (Dr. Amedeo Plenty) S/G divertics//colonoscopy L&R divertics (Dr. Amedeo Plenty) 11/08/2004  . PVC (premature ventricular contraction)   . Bradycardia   . Gout   . HTN (hypertension)   . PVD (peripheral vascular disease) ([place])     followed by Dr Trula Slade, treating medically (pletal and aspirin)  . Mitral regurgitation     Pulmonic regurgitation, with marked LA enlargement  . CAD (coronary artery disease)     nonobstructive  . Nonischemic cardiomyopathy ([facility])     EF 40%  . Arthritis     Gout  . BPH (benign prostatic hypertrophy)   . Chronic anemia 2013    h/o IDA, did not tolerate oral iron, latest normal iron studies, B12, folate  . RBBB longstanding  . Squamous cell carcinoma in situ of skin 2015    L ear    PAST SURGICAL HISTORY: Past Surgical History  Procedure Laterality Date  . Appendectomy    . Dexa  2004    "shrunk 2 inches", WNL  . Abd/pelvic ct horseshoe kidney 10/10/04      MEDICATIONS: Current Outpatient Prescriptions on File Prior to Visit  Medication Sig Dispense Refill  . Ascorbic Acid (VITAMIN C PO) Take 1 tablet by mouth 2 (two) times daily.     Marland Kitchen CALCIUM-VITAMIN D PO Take 1,200 mg by mouth daily.     . cilostazol (PLETAL) 50 MG tablet take 1 tablet by mouth twice a day 60 tablet 3  . dipyridamole-aspirin (AGGRENOX) 200-25 MG 12hr capsule Take 1 capsule by mouth 2 (two) times daily. 60 capsule 3  .  fludrocortisone (FLORINEF) 0.1 MG tablet Take 1 tablet (0.1 mg total) by mouth daily. 30 tablet 6  . pravastatin (PRAVACHOL) 20 MG tablet Take 1 tablet (20 mg total) by mouth daily. 30 tablet 3  . psyllium (FIBER THERAPY) 0.52 G capsule Take 1 capsule by mouth 2 (two) times daily.      No current facility-administered medications on file prior to visit.    ALLERGIES: Allergies  Allergen Reactions  . Atorvastatin Other (See Comments)    Memory trouble, confusion, fatigue    FAMILY HISTORY: Family History  Problem Relation Age of Onset  . Hypertension    . Heart disease Mother 8    Natural causes with CHF  . Hypertension Mother   . Hypertension Father   . Stroke Father     cerebral hemorrhage  . Hyperlipidemia Brother   . Heart disease Brother     before age 72  . Hypertension Brother   . Heart attack Brother     SOCIAL HISTORY: Social History   Social History  . Marital Status: Married    Spouse Name: N/A  . Number of Children: N/A  . Years of Education: N/A   Occupational History  . Not on file.   Social History Main Topics  . Smoking status: Former Smoker -- 2.00 packs/day    Types: Cigarettes    Quit date: 11/10/1982  . Smokeless tobacco: Former Systems developer  . Alcohol Use: 8.4 oz/week    10 Glasses of wine, 4 Cans of beer per week     Comment: 1-2 drinks/day  . Drug Use: No  . Sexual Activity: No   Other Topics Concern  . Not on file   Social History Narrative   Lives with wife [name]). Dog passed away 11-03-2012.   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 HCPOA is wife then son and then oldest daughter etc. Advanced directives in chart November 03, 2012). Does not want artificial nutrition/hydration or prolonged life support        Does have stairs in home. BS Degree    REVIEW OF SYSTEMS: Constitutional: No fevers, chills, or sweats, no generalized fatigue, change in appetite Eyes: No visual changes, double vision, eye pain Ear, nose and throat: No hearing loss, ear pain, nasal congestion, sore throat Cardiovascular: No chest pain, palpitations Respiratory:  No shortness of breath at rest or with exertion, wheezes GastrointestinaI: No nausea, vomiting, diarrhea, abdominal pain, fecal incontinence Genitourinary:  No dysuria, urinary retention or frequency Musculoskeletal:  No neck pain, back pain Integumentary: No rash, pruritus, skin lesions Neurological: as above  Psychiatric: No depression, insomnia, anxiety Endocrine: No palpitations, fatigue, diaphoresis, mood swings, change in appetite, change in weight, increased thirst Hematologic/Lymphatic:  No anemia, purpura, petechiae. Allergic/Immunologic: no itchy/runny eyes, nasal congestion, recent allergic reactions, rashes  PHYSICAL EXAM: Filed Vitals:   02/20/15 1444  BP: 136/68  Pulse: 74   General: No acute distress.  Patient appears well-groomed.  Head:  Normocephalic/atraumatic Eyes:  fundi unremarkable, without vessel changes, exudates, hemorrhages or papilledema. Neck: supple, no paraspinal tenderness, full range of motion Back: No paraspinal tenderness Heart: regular rate and rhythm Lungs: Clear to auscultation bilaterally. Vascular: No carotid bruits. Neurological Exam: Mental status: alert and oriented to person, place, and time, recent and remote memory intact, fund of knowledge intact, attention and concentration intact, speech fluent and not dysarthric, language intact. MMSE - Mini Mental State Exam 02/20/2015  Orientation to time 5  Orientation to Place 5  Registration 3  Attention/ Calculation 5  Recall 1  Language- name 2 objects 2  Language- repeat 1  Language- follow 3 step command 3  Language- read & follow direction 1  Write a  sentence 1  Copy design 1  Total score 28   Cranial nerves: CN I: not tested CN II: pupils equal, round and reactive to light, visual fields intact, fundi unremarkable, without vessel changes, exudates, hemorrhages or papilledema. CN III, IV, VI:  full range of motion, no nystagmus, no ptosis CN V: facial sensation intact CN VII: upper and lower face symmetric CN VIII: hearing intact CN IX, X: gag intact, uvula midline CN XI: sternocleidomastoid and trapezius muscles intact CN XII: tongue midline Bulk & Tone: normal, no fasciculations. Motor:  5/5 throughout  Sensation: temperature and vibration sensation intact. Deep Tendon Reflexes:  2+ throughout, toes downgoing.  Finger to nose testing:  Without dysmetria.  Heel to shin:  Without dysmetria.  Gait:  Normal station and stride.  Able to turn and tandem walk. Romberg negative.  IMPRESSION: 1.  Recurrent transient altered sensorium presenting as confusion and expressive aphasia.  It is possibly a transient ischemic-like event but likely related to decreased cerebral perfusion secondary to hypotension.  Typical recurrent TIAs wouldn't present with the same symptomatology unless there was a focal arterial stenosis.  Seizures are also on the differential.  2.  Memory deficits. 3.  Neurogenic orthostatic hypotension  PLAN: 1.  Continue Aggrenox and statin 2.  Will check EEG 3.  Will check B12.  If unremarkable, refer for neuropsychological testing 4.  Maintain appropriate blood pressure 5.  Follow up in 3 months.  Thank you for allowing me to take part in the care of this patient.  Metta Clines, DO  CC:  Izola Price, MD

## 2015-02-23 ENCOUNTER — Telehealth: Payer: Self-pay

## 2015-02-23 ENCOUNTER — Other Ambulatory Visit: Payer: Medicare Other

## 2015-02-23 DIAGNOSIS — E538 Deficiency of other specified B group vitamins: Secondary | ICD-10-CM

## 2015-02-23 DIAGNOSIS — R413 Other amnesia: Secondary | ICD-10-CM

## 2015-02-23 NOTE — Telephone Encounter (Signed)
-----   Message from Pieter Partridge, DO sent at 02/23/2015  7:17 AM EST ----- B12 level is in a low-normal range.  I would like to check a methylmalonic acid level to compare.  I would also like to proceed with neuropsychological testing with follow up afterwards

## 2015-02-23 NOTE — Telephone Encounter (Signed)
Orders placed for both. Pt aware. Will come in this afternoon for lab order.

## 2015-02-26 LAB — METHYLMALONIC ACID, SERUM: Methylmalonic Acid, Quant: 153 nmol/L (ref 87–318)

## 2015-02-27 ENCOUNTER — Telehealth: Payer: Self-pay | Admitting: Neurology

## 2015-02-27 ENCOUNTER — Telehealth: Payer: Self-pay

## 2015-02-27 NOTE — Telephone Encounter (Signed)
Pt returned your call/[phone]

## 2015-02-27 NOTE — Telephone Encounter (Signed)
See previous telephone encounter.

## 2015-02-27 NOTE — Telephone Encounter (Signed)
Message relayed to patient. Verbalized understanding and denied questions.   

## 2015-02-27 NOTE — Telephone Encounter (Signed)
-----   Message from Pieter Partridge, DO sent at 02/27/2015  7:11 AM EST ----- Methylmalonic acid level is normal, indicating that he does not have B12 deficiency.  However, since the B12 level is on the low normal side, I would recommend taking over the counter B12 1000 mcg daily.  I would also like to refer him for neuropsychological testing with follow up with me afterwards to review and discuss next steps.

## 2015-02-27 NOTE — Telephone Encounter (Signed)
Left message on machine for pt to return call to the office.  

## 2015-02-27 NOTE — Telephone Encounter (Signed)
Neuropsych. Testing scheduled @ [facility] for 04/02/15 for Clinical Interview. 04/10/15  for testing session, an on 04/30/15 for Feedback/results. Follow up with you scheduled for 04/09/15 @ 9:45.

## 2015-02-28 ENCOUNTER — Other Ambulatory Visit: Payer: Self-pay | Admitting: Family Medicine

## 2015-03-05 ENCOUNTER — Ambulatory Visit (INDEPENDENT_AMBULATORY_CARE_PROVIDER_SITE_OTHER): Payer: Medicare Other | Admitting: Neurology

## 2015-03-05 DIAGNOSIS — F05 Delirium due to known physiological condition: Secondary | ICD-10-CM

## 2015-03-05 NOTE — Procedures (Signed)
ELECTROENCEPHALOGRAM REPORT  Date of Study: 03/05/2015  Patient's Name: QUASHON HUBBARD MRN: [id number] Date of Birth: 10-09-1934  Referring Provider: Metta Clines, DO  Indication: Recurrent confusion and expressive aphasia  Medications: Pletal Pravachol Florinef  Technical Summary: This is a multichannel digital EEG recording, using the international 10-20 placement system with electrodes applied with paste and impedances below 5000 ohms.    Description: The EEG background is symmetric, with a well-developed posterior dominant rhythm of 8 Hz, which is reactive to eye opening and closing.  Diffuse beta activity is seen, with a bilateral frontal preponderance.  No focal or generalized abnormalities are seen.  No focal or generalized epileptiform discharges are seen.  Stage II sleep is not seen.  Hyperventilation was not performed.  Photic stimulation was performed, and produced no abnormalities.  ECG revealed PVCs  Impression: This is a normal routine EEG of the awake and drowsy, with activating procedures.  A normal study does not rule out the possibility of a seizure disorder in this patient.  Deyani Hegarty R. Tomi Likens, DO

## 2015-03-06 ENCOUNTER — Telehealth: Payer: Self-pay

## 2015-03-06 DIAGNOSIS — Z85828 Personal history of other malignant neoplasm of skin: Secondary | ICD-10-CM | POA: Diagnosis not present

## 2015-03-06 DIAGNOSIS — L738 Other specified follicular disorders: Secondary | ICD-10-CM | POA: Diagnosis not present

## 2015-03-06 DIAGNOSIS — D225 Melanocytic nevi of trunk: Secondary | ICD-10-CM | POA: Diagnosis not present

## 2015-03-06 DIAGNOSIS — L720 Epidermal cyst: Secondary | ICD-10-CM | POA: Diagnosis not present

## 2015-03-06 DIAGNOSIS — L821 Other seborrheic keratosis: Secondary | ICD-10-CM | POA: Diagnosis not present

## 2015-03-06 NOTE — Telephone Encounter (Signed)
Left message on machine for pt to return call to the office.  

## 2015-03-06 NOTE — Telephone Encounter (Signed)
Message relayed to patient. Verbalized understanding and denied questions.   

## 2015-03-06 NOTE — Telephone Encounter (Signed)
-----   Message from Pieter Partridge, DO sent at 03/06/2015  7:29 AM EST ----- EEG is normal

## 2015-03-22 ENCOUNTER — Encounter: Payer: Self-pay | Admitting: *Deleted

## 2015-04-02 DIAGNOSIS — R41843 Psychomotor deficit: Secondary | ICD-10-CM | POA: Diagnosis not present

## 2015-04-02 DIAGNOSIS — R413 Other amnesia: Secondary | ICD-10-CM | POA: Diagnosis not present

## 2015-04-02 DIAGNOSIS — R55 Syncope and collapse: Secondary | ICD-10-CM | POA: Diagnosis not present

## 2015-04-09 ENCOUNTER — Ambulatory Visit: Payer: Medicare Other | Admitting: Neurology

## 2015-04-10 DIAGNOSIS — R413 Other amnesia: Secondary | ICD-10-CM | POA: Diagnosis not present

## 2015-04-10 DIAGNOSIS — R55 Syncope and collapse: Secondary | ICD-10-CM | POA: Diagnosis not present

## 2015-04-10 DIAGNOSIS — R41843 Psychomotor deficit: Secondary | ICD-10-CM | POA: Diagnosis not present

## 2015-04-13 ENCOUNTER — Other Ambulatory Visit: Payer: Self-pay | Admitting: *Deleted

## 2015-04-13 MED ORDER — FLUDROCORTISONE ACETATE 0.1 MG PO TABS
0.1000 mg | ORAL_TABLET | Freq: Every day | ORAL | Status: DC
Start: 2015-04-13 — End: 2015-06-20

## 2015-04-23 DIAGNOSIS — H2511 Age-related nuclear cataract, right eye: Secondary | ICD-10-CM | POA: Diagnosis not present

## 2015-04-23 DIAGNOSIS — H25011 Cortical age-related cataract, right eye: Secondary | ICD-10-CM | POA: Diagnosis not present

## 2015-04-23 DIAGNOSIS — H00029 Hordeolum internum unspecified eye, unspecified eyelid: Secondary | ICD-10-CM | POA: Diagnosis not present

## 2015-04-23 DIAGNOSIS — H02839 Dermatochalasis of unspecified eye, unspecified eyelid: Secondary | ICD-10-CM | POA: Diagnosis not present

## 2015-04-30 DIAGNOSIS — I493 Ventricular premature depolarization: Secondary | ICD-10-CM | POA: Diagnosis not present

## 2015-04-30 DIAGNOSIS — R001 Bradycardia, unspecified: Secondary | ICD-10-CM | POA: Diagnosis not present

## 2015-04-30 DIAGNOSIS — I420 Dilated cardiomyopathy: Secondary | ICD-10-CM | POA: Diagnosis not present

## 2015-04-30 DIAGNOSIS — I739 Peripheral vascular disease, unspecified: Secondary | ICD-10-CM | POA: Diagnosis not present

## 2015-04-30 DIAGNOSIS — I10 Essential (primary) hypertension: Secondary | ICD-10-CM | POA: Diagnosis not present

## 2015-04-30 DIAGNOSIS — D539 Nutritional anemia, unspecified: Secondary | ICD-10-CM | POA: Diagnosis not present

## 2015-04-30 DIAGNOSIS — I951 Orthostatic hypotension: Secondary | ICD-10-CM | POA: Diagnosis not present

## 2015-04-30 DIAGNOSIS — F09 Unspecified mental disorder due to known physiological condition: Secondary | ICD-10-CM | POA: Diagnosis not present

## 2015-05-03 ENCOUNTER — Other Ambulatory Visit: Payer: Self-pay | Admitting: Family Medicine

## 2015-05-03 NOTE — Telephone Encounter (Signed)
Electronic refill request. Last Filled:    60 capsule 3 01/02/2015  Last office visit:   02/13/15  Please advise.

## 2015-05-07 ENCOUNTER — Encounter: Payer: Self-pay | Admitting: Family Medicine

## 2015-05-07 DIAGNOSIS — F4489 Other dissociative and conversion disorders: Secondary | ICD-10-CM | POA: Insufficient documentation

## 2015-05-10 DIAGNOSIS — H2511 Age-related nuclear cataract, right eye: Secondary | ICD-10-CM | POA: Diagnosis not present

## 2015-05-10 DIAGNOSIS — H25811 Combined forms of age-related cataract, right eye: Secondary | ICD-10-CM | POA: Diagnosis not present

## 2015-05-10 DIAGNOSIS — H2513 Age-related nuclear cataract, bilateral: Secondary | ICD-10-CM | POA: Diagnosis not present

## 2015-05-11 DIAGNOSIS — H2512 Age-related nuclear cataract, left eye: Secondary | ICD-10-CM | POA: Diagnosis not present

## 2015-05-31 DIAGNOSIS — H25812 Combined forms of age-related cataract, left eye: Secondary | ICD-10-CM | POA: Diagnosis not present

## 2015-05-31 DIAGNOSIS — H2512 Age-related nuclear cataract, left eye: Secondary | ICD-10-CM | POA: Diagnosis not present

## 2015-05-31 DIAGNOSIS — H2513 Age-related nuclear cataract, bilateral: Secondary | ICD-10-CM | POA: Diagnosis not present

## 2015-06-01 ENCOUNTER — Ambulatory Visit: Payer: Medicare Other | Admitting: Neurology

## 2015-06-04 ENCOUNTER — Ambulatory Visit (INDEPENDENT_AMBULATORY_CARE_PROVIDER_SITE_OTHER): Payer: Medicare Other | Admitting: Neurology

## 2015-06-04 ENCOUNTER — Encounter: Payer: Self-pay | Admitting: Neurology

## 2015-06-04 VITALS — BP 128/64 | HR 73 | Ht 69.0 in | Wt 158.0 lb

## 2015-06-04 DIAGNOSIS — E785 Hyperlipidemia, unspecified: Secondary | ICD-10-CM

## 2015-06-04 DIAGNOSIS — G4752 REM sleep behavior disorder: Secondary | ICD-10-CM | POA: Diagnosis not present

## 2015-06-04 DIAGNOSIS — I679 Cerebrovascular disease, unspecified: Secondary | ICD-10-CM | POA: Diagnosis not present

## 2015-06-04 DIAGNOSIS — G3184 Mild cognitive impairment, so stated: Secondary | ICD-10-CM | POA: Diagnosis not present

## 2015-06-04 DIAGNOSIS — I951 Orthostatic hypotension: Secondary | ICD-10-CM | POA: Diagnosis not present

## 2015-06-04 NOTE — Patient Instructions (Signed)
Episodes of confusion likely related to drops in blood pressure  1.  Continue Aggrenox 2.  Continue cholesterol-lowering medication 3.  Important to keep blood pressure normal and not to drop 4.  Refer for sleep study 5.  Continue B12 1000 mcg daily 6.  Try to discontinue all alcohol (including wine) 7.  Repeat neuropsychological testing in January 2018.  Follow up soon afterwards in January 2018

## 2015-06-04 NOTE — Progress Notes (Signed)
NEUROLOGY FOLLOW UP OFFICE NOTE  KHIZER TOEWS [id number]  HISTORY OF PRESENT ILLNESS: Adam Clements is a 81 year old right-handed male with neuro-cardiogenic orthostatic hypotension and PAD who follows up for recurrent episodes of confusion.  Neuropsychological testing report, EEG and labs reviewed.  UPDATE: B12 was in low-end of normal at 243, but methylmalonic acid level was normal at 153.  He was advised to start B12 1000 mcg daily.   EEG from 03/05/15 was normal.  He had neuropsychological testing at [facility] on 04/10/15.  Testing suggested mild unspecified mental disorder as demonstrated by deficits in semantic fluency with weakness in verbal memory encoding and recognition.  Recurrent episodes of hypotension likely the cause of this.  HISTORY: He was diagnosed with neurocardiogenic orthostatic hypotension after experiencing several syncopal events over the past year.  He would often pass out after period of exertion or frequent voiding.   He has been treated with Florinef.  Since the summer, he has had recurrent episodes of difficulty with speech output.  They seem to correlate with low blood pressure.  On 01/01/15, he developed confusion.  He was preparing for a colonoscopy and had trouble understanding the report.  He also had trouble with verbal output and was slurring his speech.  The following morning, he had right-sided headache.  Blood pressure was found to be 64/49.  EKG showed NSR of 60 bpm.  He was treated with IV fluids and felt better.  His PCP changed his ASA 325mg  to Aggrenox for suspected TIA.  LDL was 73.  TSH 1.18.  He was started on atorvastatin 20mg  daily.  CT of head showed no acute intracranial process.  Carotid doppler showed no hemodynamically significant ICA stenosis.  On the morning of 02/05/15, he developed confusion.  He had trouble figuring out how to put in his hearing aids.  He didn't feel well.  He presented to the ED, where CT of head showed chronic left  frontal sinusitis but no acute intracranial process.  UA was negative.  Manifestation of his orthostatic hypotension was suspected and it was suggested that his Florinef should be adjusted.  He followed up with his PCP.  MRI and MRA of head was performed on 02/07/15 showed no acute infarct or intracranial stenosis.  Over the past year, he notes increased fatigue.  He exhibits symptoms of REM sleep behavior disorder.  He will often thrash his arms when dreaming about being attacked.  This is recurrent.  Other than when he passes out, he denies falls or gait instability.  He has cardiomyopathy with an EF of 35-40%.   He also reports gradually worsening short-term memory over the past 6 month as well.  He reports that he needs to constantly write things down.  When he is distracted for a moment, he will forget what he needed to do.  He denies problems performing everyday tasks, disorientation while driving, or remembering names or faces.  He used to abuse alcohol but currently drinks one glass of wine a day.  He denies family history of dementia.  His mother had epilepsy.  TSH was 1.18.  PAST MEDICAL HISTORY: Past Medical History  Diagnosis Date  . Allergic rhinitis   . Diverticulosis of colon     conoscopy 12/1993 (Dr. Amedeo Plenty) S/G divertics//colonoscopy L&R divertics (Dr. Amedeo Plenty) 11/08/2004  . PVC (premature ventricular contraction)   . Bradycardia   . Gout   . HTN (hypertension)   . PVD (peripheral vascular disease) ([place])  followed by Dr Trula Slade, treating medically (pletal and aspirin)  . Mitral regurgitation     Pulmonic regurgitation, with marked LA enlargement  . CAD (coronary artery disease)     nonobstructive  . Nonischemic cardiomyopathy (HCC)     EF 40%  . Arthritis     Gout  . BPH (benign prostatic hypertrophy)   . Chronic anemia 2013    h/o IDA, did not tolerate oral iron, latest normal iron studies, B12, folate  . RBBB longstanding  . Squamous cell carcinoma in situ of skin  2015    L ear    MEDICATIONS: Current Outpatient Prescriptions on File Prior to Visit  Medication Sig Dispense Refill  . Ascorbic Acid (VITAMIN C PO) Take 1 tablet by mouth 2 (two) times daily.     Marland Kitchen CALCIUM-VITAMIN D PO Take 1,200 mg by mouth daily.     . cilostazol (PLETAL) 50 MG tablet take 1 tablet by mouth twice a day 60 tablet 11  . dipyridamole-aspirin (AGGRENOX) 200-25 MG 12hr capsule take 1 capsule by mouth twice a day 60 capsule 11  . fludrocortisone (FLORINEF) 0.1 MG tablet Take 1 tablet (0.1 mg total) by mouth daily. 30 tablet 6  . pravastatin (PRAVACHOL) 20 MG tablet Take 1 tablet (20 mg total) by mouth daily. 30 tablet 3  . psyllium (FIBER THERAPY) 0.52 G capsule Take 1 capsule by mouth 2 (two) times daily.      No current facility-administered medications on file prior to visit.    ALLERGIES: Allergies  Allergen Reactions  . Atorvastatin Other (See Comments)    Memory trouble, confusion, fatigue    FAMILY HISTORY: Family History  Problem Relation Age of Onset  . Hypertension    . Heart disease Mother 35    Natural causes with CHF  . Hypertension Mother   . Hypertension Father   . Stroke Father     cerebral hemorrhage  . Hyperlipidemia Brother   . Heart disease Brother     before age 17  . Hypertension Brother   . Heart attack Brother     SOCIAL HISTORY: Social History   Social History  . Marital Status: Married    Spouse Name: N/A  . Number of Children: N/A  . Years of Education: N/A   Occupational History  . Not on file.   Social History Main Topics  . Smoking status: Former Smoker -- 2.00 packs/day    Types: Cigarettes    Quit date: 11/10/1982  . Smokeless tobacco: Former Systems developer  . Alcohol Use: 8.4 oz/week    10 Glasses of wine, 4 Cans of beer per week     Comment: 1-2 drinks/day, prior to 2005 more  . Drug Use: No  . Sexual Activity: No   Other Topics Concern  . Not on file   Social History Narrative   Lives with wife [name]). Dog  passed away 10-28-12.   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 HCPOA is wife then son and then oldest daughter etc. Advanced directives in chart 28-Oct-2012). Does not want artificial nutrition/hydration or prolonged life support      Does have stairs in home. BS Degree    REVIEW OF SYSTEMS: Constitutional: No fevers, chills, or sweats, no generalized fatigue, change  in appetite Eyes: No visual changes, double vision, eye pain Ear, nose and throat: No hearing loss, ear pain, nasal congestion, sore throat Cardiovascular: No chest pain, palpitations Respiratory:  No shortness of breath at rest or with exertion, wheezes GastrointestinaI: No nausea, vomiting, diarrhea, abdominal pain, fecal incontinence Genitourinary:  No dysuria, urinary retention or frequency Musculoskeletal:  No neck pain, back pain Integumentary: No rash, pruritus, skin lesions Neurological: as above Psychiatric: No depression, insomnia, anxiety Endocrine: No palpitations, fatigue, diaphoresis, mood swings, change in appetite, change in weight, increased thirst Hematologic/Lymphatic:  No anemia, purpura, petechiae. Allergic/Immunologic: no itchy/runny eyes, nasal congestion, recent allergic reactions, rashes  PHYSICAL EXAM: Filed Vitals:   06/04/15 0925  BP: 128/64  Pulse: 73   General: No acute distress.  Patient appears well-groomed.   Head:  Normocephalic/atraumatic Eyes:  Fundi examined but not visualized Neck: supple, no paraspinal tenderness, full range of motion Heart:  Regular rate and rhythm Lungs:  Clear to auscultation bilaterally Back: No paraspinal tenderness Neurological Exam: alert and oriented to person, place, and time. Attention span and concentration  intact, delayed recall impaired, remote memory intact, fund of knowledge intact.  Speech fluent and not dysarthric, language intact.   Montreal Cognitive Assessment  06/04/2015  Visuospatial/ Executive (0/5) 4  Naming (0/3) 3  Attention: Read list of digits (0/2) 2  Attention: Read list of letters (0/1) 1  Attention: Serial 7 subtraction starting at 100 (0/3) 3  Language: Repeat phrase (0/2) 2  Language : Fluency (0/1) 1  Abstraction (0/2) 2  Delayed Recall (0/5) 2  Orientation (0/6) 6  Total 26  Adjusted Score (based on education) 26   CN II-XII intact. Bulk and tone normal, mildly decreased speed and amplitude of finger thumb tapping in right hand, muscle strength 5/5 throughout.  Sensation to light touch, temperature and vibration intact.  Deep tendon reflexes 2+ throughout, toes downgoing.  Finger to nose and heel to shin testing intact.  Gait normal, Romberg negative.  IMPRESSION: Memory and some mild cognitive deficits, secondary to recurrent episodes of cerebral hypoperfusion Orthostatic hypotension REM Sleep behavior disorder Fatigue Cerebrovascular disease  Given the orthostatic hypotension, asnomia and REM Sleep Behavior Disorder, one must consider a parkinsonian type syndrome, such as Multiple System Atrophy.  He does not exhibit any bradykinesia or recurrent falls (other than when he passes out) to suggest this at this time.  PLAN: 1.  Continue Aggrenox 2.  Continue cholesterol-lowering medication 3.  Important to maintain blood pressure normal and not to drop 4.  Refer for sleep study 5.  Continue B12 1000 mcg daily 6.  Try to discontinue all alcohol (including wine) 7.  Repeat neuropsychological testing in January 2018.  Follow up soon afterwards in January 2018  Metta Clines, DO  CC:  Ria Bush, MD  Thompson Grayer, MD

## 2015-06-19 ENCOUNTER — Ambulatory Visit: Payer: Medicare Other | Admitting: Neurology

## 2015-06-20 ENCOUNTER — Encounter: Payer: Self-pay | Admitting: Internal Medicine

## 2015-06-20 ENCOUNTER — Ambulatory Visit (INDEPENDENT_AMBULATORY_CARE_PROVIDER_SITE_OTHER): Payer: Medicare Other | Admitting: Internal Medicine

## 2015-06-20 VITALS — BP 118/52 | HR 81 | Ht 69.0 in | Wt 160.2 lb

## 2015-06-20 DIAGNOSIS — R55 Syncope and collapse: Secondary | ICD-10-CM

## 2015-06-20 DIAGNOSIS — I519 Heart disease, unspecified: Secondary | ICD-10-CM

## 2015-06-20 DIAGNOSIS — I951 Orthostatic hypotension: Secondary | ICD-10-CM

## 2015-06-20 DIAGNOSIS — G459 Transient cerebral ischemic attack, unspecified: Secondary | ICD-10-CM | POA: Diagnosis not present

## 2015-06-20 MED ORDER — FLUDROCORTISONE ACETATE 0.1 MG PO TABS: 0.1000 mg | ORAL_TABLET | Freq: Two times a day (BID) | ORAL | Status: DC

## 2015-06-20 NOTE — Patient Instructions (Addendum)
Medication Instructions:  Your physician has recommended you make the following change in your medication:  1) Increase Florinef to 0.1 mg twice daily   Labwork: None ordered   Testing/Procedures: Your physician has requested that you have an echocardiogram. Echocardiography is a painless test that uses sound waves to create images of your heart. It provides your doctor with information about the size and shape of your heart and how well your heart's chambers and valves are working. This procedure takes approximately one hour. There are no restrictions for this procedure.    Follow-Up: Your physician recommends that you schedule a follow-up appointment in: 6 weeks with Tommye Standard PA   Any Other Special Instructions Will Be Listed Below (If Applicable).     If you need a refill on your cardiac medications before your next appointment, please call your pharmacy.

## 2015-06-20 NOTE — Progress Notes (Signed)
PCP: Ria Bush, MD  The patient presents today for routine followup. He reports doing reasonably well since last being seen in our clinic.  His claudication is stable.  Orthostasis is much improved with florinef.   He continues to have frequent low blood pressures on home recordings with episodic presyncope.  He has had 2 episodes of syncope over the past 6 months.  He has reduced EF but with class I/II symptoms.  Medical therapy has been limited by symptomatic hypotension.  He had syncope while shaving 1 week ago.  This was abrupt in nature.  The patient denies symptoms of palpitations, chest pain, shortness of breath, orthopnea, PND, lower extremity edema, presyncope, or recent syncope.  The patient is tolerating medications without difficulties and is otherwise without complaint today.    Past Medical History  Diagnosis Date  . Allergic rhinitis   . Diverticulosis of colon     conoscopy 12/1993 (Dr. Amedeo Plenty) S/G divertics//colonoscopy L&R divertics (Dr. Amedeo Plenty) 11/08/2004  . PVC (premature ventricular contraction)   . Bradycardia   . Gout   . HTN (hypertension)   . PVD (peripheral vascular disease) ([place])     followed by Dr Trula Slade, treating medically (pletal and aspirin)  . Mitral regurgitation     Pulmonic regurgitation, with marked LA enlargement  . CAD (coronary artery disease)     nonobstructive  . Nonischemic cardiomyopathy (HCC)     EF 40%  . Arthritis     Gout  . BPH (benign prostatic hypertrophy)   . Chronic anemia 2013    h/o IDA, did not tolerate oral iron, latest normal iron studies, B12, folate  . RBBB longstanding  . Squamous cell carcinoma in situ of skin 2015    L ear  . Syncope   . Dizziness   . Memory loss   . Orthostatic hypotension    Past Surgical History  Procedure Laterality Date  . Appendectomy    . Dexa  2004    "shrunk 2 inches", WNL  . Abd/pelvic ct horseshoe kidney 10/10/04      Current Outpatient Prescriptions  Medication Sig Dispense  Refill  . Ascorbic Acid (VITAMIN C PO) Take 1 tablet by mouth 2 (two) times daily.     Marland Kitchen CALCIUM-VITAMIN D PO Take 1,200 mg by mouth daily.     . cilostazol (PLETAL) 50 MG tablet take 1 tablet by mouth twice a day 60 tablet 11  . dipyridamole-aspirin (AGGRENOX) 200-25 MG 12hr capsule take 1 capsule by mouth twice a day 60 capsule 11  . DUREZOL 0.05 % EMUL Apply 1 drop to eye as directed.  0  . fludrocortisone (FLORINEF) 0.1 MG tablet Take 1 tablet (0.1 mg total) by mouth daily. 30 tablet 6  . ILEVRO 0.3 % ophthalmic suspension Apply 1 drop to eye as directed.  0  . pravastatin (PRAVACHOL) 20 MG tablet Take 1 tablet (20 mg total) by mouth daily. 30 tablet 3  . psyllium (FIBER THERAPY) 0.52 G capsule Take 1 capsule by mouth 2 (two) times daily.      No current facility-administered medications for this visit.    Allergies  Allergen Reactions  . Atorvastatin Other (See Comments)    Memory trouble, confusion, fatigue    Social History   Social History  . Marital Status: Married    Spouse Name: N/A  . Number of Children: N/A  . Years of Education: N/A   Occupational History  . Not on file.   Social History Main Topics  .  Smoking status: Former Smoker -- 2.00 packs/day    Types: Cigarettes    Quit date: 11/10/1982  . Smokeless tobacco: Former Systems developer  . Alcohol Use: 8.4 oz/week    10 Glasses of wine, 4 Cans of beer per week     Comment: 1-2 drinks/day, prior to 2005 more  . Drug Use: No  . Sexual Activity: No   Other Topics Concern  . Not on file   Social History Narrative   Lives with wife [name]). Dog passed away 2012/11/17.   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 HCPOA is wife then son and then oldest daughter etc. Advanced  directives in chart November 17, 2012). Does not want artificial nutrition/hydration or prolonged life support      Does have stairs in home. BS Degree    Family History  Problem Relation Age of Onset  . Hypertension    . Heart disease Mother 45    Natural causes with CHF  . Hypertension Mother   . Hypertension Father   . Stroke Father     cerebral hemorrhage  . Hyperlipidemia Brother   . Heart disease Brother     before age 70  . Hypertension Brother   . Heart attack Brother      Physical Exam: Filed Vitals:   06/20/15 0909  BP: 118/52  Pulse: 81  Height: 5\' 9"  (1.753 m)  Weight: 160 lb 3.2 oz (72.666 kg)    GEN- The patient is well appearing, alert and oriented x 3 today.   Head- normocephalic, atraumatic Eyes-  Sclera clear, conjunctiva pink Ears- hearing intact Oropharynx- clear Neck- supple, no JVP, no bruit Lymph- no cervical lymphadenopathy Lungs- Clear to ausculation bilaterally, normal work of breathing Heart- Regular rate and rhythm, no murmurs, rubs or gallops, PMI not laterally displaced,diminished DP/PT pulses today GI- soft, NT, ND, + BS Extremities- no clubbing, cyanosis, or edema, extremities warm and cool  ekg today reveals sinus rhythm 68 bpm, PR 174, QRS 154, RBBB, LVH Echo 2015 is reviewed  Epic records are reviewed  Assessment and Plan:  1. Hypotension Has dysautonomia and chronic hypotension I will increase florinef to 0.1mg  BID at this time Abrupt syncope while shaving is worrisome.  I have advised monitoring with implantable loop recorder for recurrent syncope.  He would like to avoid this if possible but may reconsider if repeat syncope occurs.  He does not wish to have 30 day monitor.  2. Nonischemic CM NYHA Class I/II Symptomatic postural hypotension limits our ability to treat Repeat echo at this time Given advanced age, would not favor ICD in this patient for primary prevention  3. Postural dizziness Adequate hydration and salt  liberalization encouraged increase florinef (as above)  4. PAD Follows with Dr Trula Slade  5. Expression aphasia Followed by neurology  Return to see EP PA in 6 weeks EP issues are stable Return to see me in 1 year and follow-up with EP PA in the interim  Thompson Grayer MD, [facility] 06/20/2015 10:26 AM

## 2015-06-22 ENCOUNTER — Other Ambulatory Visit: Payer: Self-pay | Admitting: Family Medicine

## 2015-06-25 ENCOUNTER — Ambulatory Visit: Payer: Medicare Other | Admitting: Family Medicine

## 2015-06-29 ENCOUNTER — Encounter: Payer: Self-pay | Admitting: Family Medicine

## 2015-06-29 ENCOUNTER — Ambulatory Visit (INDEPENDENT_AMBULATORY_CARE_PROVIDER_SITE_OTHER): Payer: Medicare Other | Admitting: Family Medicine

## 2015-06-29 VITALS — BP 146/78 | HR 60 | Temp 97.5°F | Wt 159.0 lb

## 2015-06-29 DIAGNOSIS — I739 Peripheral vascular disease, unspecified: Secondary | ICD-10-CM | POA: Diagnosis not present

## 2015-06-29 DIAGNOSIS — G459 Transient cerebral ischemic attack, unspecified: Secondary | ICD-10-CM | POA: Diagnosis not present

## 2015-06-29 DIAGNOSIS — I499 Cardiac arrhythmia, unspecified: Secondary | ICD-10-CM

## 2015-06-29 DIAGNOSIS — I951 Orthostatic hypotension: Secondary | ICD-10-CM

## 2015-06-29 DIAGNOSIS — R55 Syncope and collapse: Secondary | ICD-10-CM | POA: Diagnosis not present

## 2015-06-29 NOTE — Assessment & Plan Note (Signed)
Continue aggrenox and pravastatin. Appreciate neuro care.

## 2015-06-29 NOTE — Progress Notes (Signed)
Pre visit review using our clinic review tool, if applicable. No additional management support is needed unless otherwise documented below in the visit note. 

## 2015-06-29 NOTE — Assessment & Plan Note (Signed)
No further episodes

## 2015-06-29 NOTE — Patient Instructions (Addendum)
Decrease pravastatin to once daily again. Monitor blood pressure at home - if you don't notice low blood pressures <110, continue once daily fludrocortisone. If blood pressure dropping below 110 consistently, increase fludrocortisone to twice daily.  Ok to liberalize salt intake a little. Make sure you stay well hydrated, shoot for 60oz water daily.  Stop calcium/vit D. Start plain vitamin D over the counter 1000 units daily. If sleep study not yet scheduled, call Dr Georgie Chard office to schedule EKG today.

## 2015-06-29 NOTE — Progress Notes (Addendum)
BP 146/78 mmHg  Pulse 60  Temp(Src) 97.5 F (36.4 C)  Wt 159 lb (72.122 kg)   CC: f/u visit  Subjective:    Patient ID: Adam Clements, male    DOB: 03-Jun-1934, 81 y.o.   MRN: [id number]  HPI: Adam Clements is a 81 y.o. male presenting on 06/29/2015 for Follow-up   See recent notes for details. Recently saw neurology Tomi Likens) and cardiology ([facility]). Notes reviewed. Mild cognitive deficit presumed from cerebral hypoperfusion, planned rpt neuropsychological testing 03/2016. Referred for sleep study as well - pending. Advised to stop all alcohol. For dysautonomia with chronic hyoptension - florinef increased to 0.1mg  bid. Declined offer for implantable loop recorder for recurrent syncope. Echo pending repeat. Has not had any further syncope in last few weeks.   He accidentally increased pravastaitn to twice daily instead of florinef.   weight 159 lbs today.  Completed bilateral cataract extractions.   Relevant past medical, surgical, family and social history reviewed and updated as indicated. Interim medical history since our last visit reviewed. Allergies and medications reviewed and updated. Current Outpatient Prescriptions on File Prior to Visit  Medication Sig  . cilostazol (PLETAL) 50 MG tablet take 1 tablet by mouth twice a day  . dipyridamole-aspirin (AGGRENOX) 200-25 MG 12hr capsule take 1 capsule by mouth twice a day  . pravastatin (PRAVACHOL) 20 MG tablet take 1 tablet by mouth once daily  . psyllium (FIBER THERAPY) 0.52 G capsule Take 1 capsule by mouth daily as needed.    No current facility-administered medications on file prior to visit.    Review of Systems Per HPI unless specifically indicated in ROS section     Objective:    BP 146/78 mmHg  Pulse 60  Temp(Src) 97.5 F (36.4 C)  Wt 159 lb (72.122 kg)  Wt Readings from Last 3 Encounters:  06/29/15 159 lb (72.122 kg)  06/20/15 160 lb 3.2 oz (72.666 kg)  06/04/15 158 lb (71.668 kg)    Physical Exam    Constitutional: He appears well-developed and well-nourished. No distress.  HENT:  Mouth/Throat: Oropharynx is clear and moist. No oropharyngeal exudate.  Cardiovascular: Normal rate, normal heart sounds and intact distal pulses.  An irregular rhythm present.  Extrasystoles are present.  No murmur heard. coupling  Pulmonary/Chest: Effort normal and breath sounds normal. No respiratory distress. He has no wheezes. He has no rales.  Musculoskeletal: He exhibits no edema.  Neurological:  No facial droop  Skin: Skin is warm and dry. No rash noted.  Psychiatric: He has a normal mood and affect.  Nursing note and vitals reviewed.  Results for orders placed or performed in visit on 02/23/15  Methylmalonic Acid  Result Value Ref Range   Methylmalonic Acid, Quant 153 87 - 318 nmol/L      Assessment & Plan:  Over 20 minutes were spent face-to-face with the patient during this encounter and >50% of that time was spent on counseling and coordination of care  Problem List Items Addressed This Visit    PAD (peripheral artery disease) (HCC)    Continue pletal. Used to treat intermittent claudication      Orthostatic hypotension    Has only been taking florinef once daily. With this bp mildly elevated today - so I suggested he continue once daily dosing.       Syncope    No further episodes.       TIA (transient ischemic attack) - Primary    Continue aggrenox and pravastatin. Appreciate  neuro care.       Irregular heart rate    Some coupling, and extrasystoles heard on exam today - update EKG.  EKG - RBBB, NSR rate 60s, nromal axis, intervals, no acute STT changes. No cause for irregular heart beat found      Relevant Orders   EKG 12-Lead (Completed)       Follow up plan: Return if symptoms worsen or fail to improve.  Ria Bush, MD

## 2015-06-29 NOTE — Assessment & Plan Note (Signed)
Has only been taking florinef once daily. With this bp mildly elevated today - so I suggested he continue once daily dosing.

## 2015-06-29 NOTE — Assessment & Plan Note (Signed)
Continue pletal. Used to treat intermittent claudication

## 2015-06-29 NOTE — Assessment & Plan Note (Addendum)
Some coupling, and extrasystoles heard on exam today - update EKG.  EKG - RBBB, NSR rate 60s, nromal axis, intervals, no acute STT changes. No cause for irregular heart beat found

## 2015-06-30 ENCOUNTER — Encounter: Payer: Self-pay | Admitting: Family Medicine

## 2015-06-30 DIAGNOSIS — I4510 Unspecified right bundle-branch block: Secondary | ICD-10-CM | POA: Insufficient documentation

## 2015-06-30 NOTE — Addendum Note (Signed)
Addended by: Ria Bush on: 06/30/2015 11:20 AM   Modules accepted: Level of Service

## 2015-07-10 ENCOUNTER — Other Ambulatory Visit: Payer: Self-pay

## 2015-07-10 ENCOUNTER — Ambulatory Visit (HOSPITAL_COMMUNITY): Payer: Medicare Other | Attending: Internal Medicine

## 2015-07-10 DIAGNOSIS — I4510 Unspecified right bundle-branch block: Secondary | ICD-10-CM | POA: Diagnosis not present

## 2015-07-10 DIAGNOSIS — I2510 Atherosclerotic heart disease of native coronary artery without angina pectoris: Secondary | ICD-10-CM | POA: Diagnosis not present

## 2015-07-10 DIAGNOSIS — I119 Hypertensive heart disease without heart failure: Secondary | ICD-10-CM | POA: Diagnosis not present

## 2015-07-10 DIAGNOSIS — I351 Nonrheumatic aortic (valve) insufficiency: Secondary | ICD-10-CM | POA: Diagnosis not present

## 2015-07-10 DIAGNOSIS — I429 Cardiomyopathy, unspecified: Secondary | ICD-10-CM | POA: Diagnosis not present

## 2015-07-10 DIAGNOSIS — I340 Nonrheumatic mitral (valve) insufficiency: Secondary | ICD-10-CM | POA: Insufficient documentation

## 2015-07-10 DIAGNOSIS — R55 Syncope and collapse: Secondary | ICD-10-CM | POA: Diagnosis not present

## 2015-07-10 DIAGNOSIS — I358 Other nonrheumatic aortic valve disorders: Secondary | ICD-10-CM | POA: Diagnosis not present

## 2015-08-20 NOTE — Progress Notes (Signed)
Cardiology Office Note Date:  08/21/2015  Patient ID:  Adam Clements, Adam Clements 17-Sep-1934, MRN [id number] PCP:  Ria Bush, MD  Cardiologist/Electrophysiologist: Dr. Rayann Heman   Chief Complaint: fatigue  History of Present Illness: Adam Clements is a 81 y.o. male with history of orthostatic hypotension on florinef, PVD followed by Dr. Trula Slade, HLD and expressive aphasia that he has seen neurology for.  He was last seen by Dr. Rayann Heman in March at that time doing fairly well, reported a syncopal event while shaving his florinef increased to BID dosing, recommended to have ILR but the patient wanted to hold off and declined 30day monitor.  He comes today seen for Dr. Rayann Heman, doing "OK", in regards to his syncope, he has had none further, has daily symptoms of lightheadedness that passes quickly this is at his baseline for years and has noted when checking his BP at home, since starting the new medicine his change in BP when standing is much better with less change/drop.  He mentions though that his exertional capacity is less in the last month or so.  He works very hard physically, Copywriter, advertising for [employer], caring for his horses, throwing hay and so on, going to the gym 3x week and feels like he needs to stop and "gather himself" this is more a feeling of physical weakness, no CP, not SOB, but weak, and not like his lightheaded symptoms.  This seems to be new symptom for him different then his orthostatic changes.  He denies any kind of CP, will occasionally feel palpitations, denies SOB, PND, DOE, or symptoms of orthopnea, he has not fainted.  And reports that he was instructed to take the florinef BID if he noted his BP < 110 and since starting that his BP has been better maintained consistantly >110 and improvement in his orthostatic change.  Past Medical History  Diagnosis Date  . Allergic rhinitis   . Diverticulosis of colon     conoscopy 12/1993 (Dr. Amedeo Plenty) S/G  divertics//colonoscopy L&R divertics (Dr. Amedeo Plenty) 11/08/2004  . PVC (premature ventricular contraction)   . Bradycardia   . Gout   . HTN (hypertension)   . PVD (peripheral vascular disease) ([facility])     followed by Dr Trula Slade, treating medically (pletal and aspirin)  . Mitral regurgitation     Pulmonic regurgitation, with marked LA enlargement  . CAD (coronary artery disease)     nonobstructive  . Nonischemic cardiomyopathy (HCC)     EF 40%  . Arthritis     Gout  . BPH (benign prostatic hypertrophy)   . Chronic anemia 2013    h/o IDA, did not tolerate oral iron, latest normal iron studies, B12, folate  . RBBB longstanding  . Squamous cell carcinoma in situ of skin 2015    L ear  . Syncope   . Dizziness   . Memory loss   . Orthostatic hypotension     Past Surgical History  Procedure Laterality Date  . Appendectomy    . Dexa  2004    "shrunk 2 inches", WNL  . 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 cholecalciferol (VITAMIN D) 1000 units tablet Take 1,000 Units by mouth daily.    . cilostazol (PLETAL) 50 MG tablet take 1 tablet by mouth twice a day 60 tablet 11  . dipyridamole-aspirin (AGGRENOX) 200-25 MG 12hr capsule take 1 capsule by mouth twice a day 60 capsule 11  .  fludrocortisone (FLORINEF) 0.1 MG tablet Take 1 tablet (0.1 mg total) by mouth daily.    . pravastatin (PRAVACHOL) 20 MG tablet take 1 tablet by mouth once daily 30 tablet 6  . psyllium (FIBER THERAPY) 0.52 G capsule Take 1 capsule by mouth daily as needed.     . vitamin B-12 (CYANOCOBALAMIN) 500 MCG tablet Take 500 mcg by mouth daily.     No current facility-administered medications for this visit.    Allergies:   Atorvastatin   Social History:  The patient  reports that he quit smoking about 32 years ago. His smoking use included Cigarettes. He smoked 2.00 packs per day. He has quit using smokeless tobacco. He reports that he drinks  about 8.4 oz of alcohol per week. He reports that he does not use illicit drugs.   Family History:  The patient's family history includes Heart attack in his brother; Heart disease in his brother; Heart disease (age of onset: 58) in his mother; Hyperlipidemia in his brother; Hypertension in his brother, father, and mother; Stroke in his father.  ROS:  Please see the history of present illness.   All other systems are reviewed and otherwise negative.   PHYSICAL EXAM:  VS:  BP 126/58 mmHg  Pulse 60  Ht 5\' 9"  (1.753 m)  Wt 154 lb (69.854 kg)  BMI 22.73 kg/m2 BMI: Body mass index is 22.73 kg/(m^2). Well nourished, well developed, in no acute distress HEENT: normocephalic, atraumatic Neck: no JVD, carotid bruits or masses Cardiac:  normal S1, S2; RRR; no significant murmurs, no rubs, or gallops Lungs:  clear to auscultation bilaterally, no wheezing, rhonchi or rales Abd: soft, nontender MS: no deformity or atrophy Ext: no edema Skin: warm and dry, no rash Neuro:  No gross deficits appreciated Psych: euthymic mood, full affect   EKG:  4/7/117: SR, RBBB, today's appears unchanged  07/10/15: Echocardiogram Study Conclusions - Left ventricle: The cavity size was normal. Wall thickness was  increased in a pattern of moderate LVH. Systolic function was  mildly reduced. The estimated ejection fraction was in the range  of 45% to 50%. Doppler parameters are consistent with abnormal  left ventricular relaxation (grade 1 diastolic dysfunction). The  E/e&' ratio is between 8-15, suggesting indeterminate LV filling  pressure. Ejection fraction (MOD, 2-plane): 47%. - Aortic valve: Trileaflet. Sclerosis without stenosis. There was  trivial regurgitation. - Mitral valve: Mildly thickened leaflets . There was mild  regurgitation. - Left atrium: Severely dilated at 58 ml/m2. - Inferior vena cava: The vessel was normal in size. The  respirophasic diameter changes were in the normal range  (>= 50%),  consistent with normal central venous pressure. Impressions: - Compared to the prior study in 2014, the EF has improved to  45-50%.  Recent Labs: 01/02/2015: TSH 1.18 02/05/2015: ALT 15*; BUN 19; Creatinine, Ser 1.05; Hemoglobin 11.6*; Platelets 199; Potassium 3.6; Sodium 136  01/02/2015: Direct LDL 73.0   CrCl cannot be calculated (Patient has no serum creatinine result on file.).   Wt Readings from Last 3 Encounters:  08/21/15 154 lb (69.854 kg)  06/29/15 159 lb (72.122 kg)  06/20/15 160 lb 3.2 oz (72.666 kg)     Other studies reviewed: Additional studies/records reviewed today include: summarized above  ASSESSMENT AND PLAN:  1. Hypotension, postural hypotension      Has dysautonomia and chronic hypotension      Improved by the patient's observation      + orthostatic change here today without symptoms  He is instructed to not climb ladders or roof work etc that would put him at fall/injury risk     Counseled on standing slowly, avoiding long periods of standing and to sit if symptoms  2. Nonischemic CM     NYHA Class I/II     Symptomatic postural hypotension limits our ability to treat     F/u echo with improved EF      3. PAD     Follows with Dr Trula Slade     On Aggrenox, Pletal, statin  4. Intermittent Expressive aphasia, confusion     Followed by neurology     Felt secondary to recurrent hypoperfusion secondary to episodes of hypotension  5. Symptom as described     Will evaluate as possible anginal equivalent given his PAD    Reportedly NOD by cath though this apparently many years ago    EKG today appears unchanged  Disposition: Plan for stress myoview, 48hour monitor and f/u in a month given some new symptomatology, sooner if needed.  Current medicines are reviewed at length with the patient today.  The patient did not have any concerns regarding medicines.  Haywood Lasso, PA-C 08/21/2015 9:40 AM     [facility] [address] [place] [address] [phone] (office)  [phone] (fax)

## 2015-08-21 ENCOUNTER — Ambulatory Visit (INDEPENDENT_AMBULATORY_CARE_PROVIDER_SITE_OTHER): Payer: Medicare Other | Admitting: Family Medicine

## 2015-08-21 ENCOUNTER — Encounter: Payer: Self-pay | Admitting: Physician Assistant

## 2015-08-21 ENCOUNTER — Encounter: Payer: Self-pay | Admitting: Family Medicine

## 2015-08-21 ENCOUNTER — Ambulatory Visit (INDEPENDENT_AMBULATORY_CARE_PROVIDER_SITE_OTHER): Payer: Medicare Other | Admitting: Physician Assistant

## 2015-08-21 VITALS — BP 150/100 | HR 68 | Temp 97.5°F | Ht 67.5 in | Wt 151.5 lb

## 2015-08-21 VITALS — BP 126/58 | HR 60 | Ht 69.0 in | Wt 154.0 lb

## 2015-08-21 DIAGNOSIS — R5383 Other fatigue: Secondary | ICD-10-CM

## 2015-08-21 DIAGNOSIS — E538 Deficiency of other specified B group vitamins: Secondary | ICD-10-CM

## 2015-08-21 DIAGNOSIS — I951 Orthostatic hypotension: Secondary | ICD-10-CM | POA: Diagnosis not present

## 2015-08-21 DIAGNOSIS — R0602 Shortness of breath: Secondary | ICD-10-CM | POA: Diagnosis not present

## 2015-08-21 DIAGNOSIS — I739 Peripheral vascular disease, unspecified: Secondary | ICD-10-CM

## 2015-08-21 DIAGNOSIS — Z0000 Encounter for general adult medical examination without abnormal findings: Secondary | ICD-10-CM

## 2015-08-21 DIAGNOSIS — I499 Cardiac arrhythmia, unspecified: Secondary | ICD-10-CM

## 2015-08-21 DIAGNOSIS — Z7189 Other specified counseling: Secondary | ICD-10-CM

## 2015-08-21 DIAGNOSIS — E785 Hyperlipidemia, unspecified: Secondary | ICD-10-CM | POA: Diagnosis not present

## 2015-08-21 DIAGNOSIS — I519 Heart disease, unspecified: Secondary | ICD-10-CM | POA: Diagnosis not present

## 2015-08-21 DIAGNOSIS — I429 Cardiomyopathy, unspecified: Secondary | ICD-10-CM | POA: Diagnosis not present

## 2015-08-21 DIAGNOSIS — R55 Syncope and collapse: Secondary | ICD-10-CM

## 2015-08-21 DIAGNOSIS — D509 Iron deficiency anemia, unspecified: Secondary | ICD-10-CM

## 2015-08-21 NOTE — Progress Notes (Signed)
Pre visit review using our clinic review tool, if applicable. No additional management support is needed unless otherwise documented below in the visit note. 

## 2015-08-21 NOTE — Assessment & Plan Note (Addendum)
Check labs. Declines iFOB or cologuard. Did not tolerate oral iron in the past.

## 2015-08-21 NOTE — Assessment & Plan Note (Signed)
Continue florinef 0.1mg  BID. Doing well overall, no significant recent dizzy episodes.

## 2015-08-21 NOTE — Assessment & Plan Note (Signed)
Pending 48 hr event monitor. Appreciate cards care.

## 2015-08-21 NOTE — Assessment & Plan Note (Addendum)
Continue pletal and statin. Known intermittent claudication.

## 2015-08-21 NOTE — Progress Notes (Signed)
BP 150/100 mmHg  Pulse 68  Temp(Src) 97.5 F (36.4 C) (Oral)  Ht 5' 7.5" (1.715 m)  Wt 151 lb 8 oz (68.72 kg)  BMI 23.36 kg/m2  SpO2 98%   CC: medicare wellness visit  Subjective:    Patient ID: Adam Clements, male    DOB: [birth year], 81 y.o.   MRN: [id number]  HPI: Adam Clements is a 81 y.o. male presenting on 08/21/2015 for Medicare Wellness   Saw Dr Rayann Heman this morning - dysautonomia with chronic hypotension, improved. Also followed for nonischemic cardiomyopathy. Pending myoview, 48hr monitor for new decreased exertional capacity. rec against climbing ladders. Continues fludrocortisone 0.1mg  BID.   PAD - continued aggrenox, pletal, statin.   Hearing screen - wears hearing aides. Eye exam - last checked 06/2015 No falls in past year Denies significant depression/anhedonia.   Preventative: Colonoscopy - 2006 by Dr. Amedeo Plenty - R and L sided diverticulosis, rec rpt in 10 yrs. Not interested in repeat at this time. Declines cologuard.  Saw GI 2015 2/2 weight loss and IDA. Decided to do yearly hemoccults until next colonoscopy 2016, even slow release iron caused stomach upset.  Prostate screening - normal PSA in the past. H/o BPH. Age out.  DEXA 2004 WNL.  Flu shot - yearly Pneumovax 2004, prevnar 2015 Td 2012  zostavax 2009  Advanced directives: has living will at home -done through attorney. Wants HCPOA to be wife then son and then oldest daughter etc. Advanced directives in chart 10-24-2012). Does not want artificial nutrition/hydration or prolonged life support Seat belt use discussed.  Sunscreen use discussed. No changing moles on skin. Sees derm regularly.   Lives with wife [name]). Dog passed away 2012-10-24. 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 Interim medical history since our last visit reviewed. Allergies and medications reviewed and updated. Current Outpatient Prescriptions on File Prior to Visit  Medication Sig  . cholecalciferol (VITAMIN D) 1000 units tablet Take 1,000 Units by mouth daily.  . cilostazol (PLETAL) 50 MG tablet take 1 tablet by mouth twice a day  . dipyridamole-aspirin (AGGRENOX) 200-25 MG 12hr capsule take 1 capsule by mouth twice a day  . fludrocortisone (FLORINEF) 0.1 MG tablet Take 0.1 mg by mouth 2 (two) times daily.   . pravastatin (PRAVACHOL) 20 MG tablet take 1 tablet by mouth once daily  . psyllium (FIBER THERAPY) 0.52 G capsule Take 1 capsule by mouth daily as needed.   . vitamin B-12 (CYANOCOBALAMIN) 500 MCG tablet Take 500 mcg by mouth daily.   No current facility-administered medications on file prior to visit.    Review of Systems Per HPI unless specifically indicated in ROS section     Objective:    BP 150/100 mmHg  Pulse 68  Temp(Src) 97.5 F (36.4 C) (Oral)  Ht 5' 7.5" (1.715 m)  Wt 151 lb 8 oz (68.72 kg)  BMI 23.36 kg/m2  SpO2 98%  Wt Readings from Last 3 Encounters:  08/21/15 151 lb 8 oz (68.72 kg)  08/21/15 154 lb (69.854 kg)  06/29/15 159 lb (72.122 kg)    Physical Exam  Constitutional: He is oriented to person, place, and time. He appears well-developed and well-nourished. No distress.  HENT:  Head: Normocephalic and atraumatic.  Right Ear: Hearing, tympanic membrane, external  ear and ear canal normal.  Left Ear: Hearing, tympanic membrane, external ear and ear canal normal.  Nose: Nose normal.  Mouth/Throat: Uvula is midline, oropharynx is clear and moist and mucous membranes are normal. No oropharyngeal exudate, posterior oropharyngeal edema or posterior oropharyngeal erythema.  Eyes: Conjunctivae and EOM are normal. Pupils are equal, round, and reactive to light. No scleral icterus.  Neck: Normal range of motion. Neck supple.  Carotid bruit is not present. No thyromegaly present.  Cardiovascular: Normal rate, regular rhythm, normal heart sounds and intact distal pulses.   No murmur heard. Pulses:      Radial pulses are 2+ on the right side, and 2+ on the left side.  Pulmonary/Chest: Effort normal and breath sounds normal. No respiratory distress. He has no wheezes. He has no rales.  Abdominal: Soft. Bowel sounds are normal. He exhibits no distension and no mass. There is no tenderness. There is no rebound and no guarding.  Musculoskeletal: Normal range of motion. He exhibits no edema.  Lymphadenopathy:    He has no cervical adenopathy.  Neurological: He is alert and oriented to person, place, and time.  CN grossly intact, station and gait intact Recall 3/3 Calculation 4/5 serial 7s  Skin: Skin is warm and dry. No rash noted.  Psychiatric: He has a normal mood and affect. His behavior is normal. Judgment and thought content normal.  Nursing note and vitals reviewed.  Results for orders placed or performed in visit on 02/23/15  Methylmalonic Acid  Result Value Ref Range   Methylmalonic Acid, Quant 153 87 - 318 nmol/L      Assessment & Plan:   Problem List Items Addressed This Visit    Chronic systolic dysfunction of left ventricle    Nonischemic. Followed by cards.      IDA (iron deficiency anemia)    Check labs. Declines iFOB or cologuard. Did not tolerate oral iron in the past.      Relevant Orders   CBC with Differential/Platelet   Ferritin   IBC panel   Medicare annual wellness visit, subsequent - Primary    I have personally reviewed the Medicare Annual Wellness questionnaire and have noted 1. The patient's medical and social history 2. Their use of alcohol, tobacco or illicit drugs 3. Their current medications and supplements 4. The patient's functional ability including ADL's, fall risks, home safety risks and hearing or visual impairment. Cognitive function has been assessed and addressed  as indicated.  5. Diet and physical activity 6. Evidence for depression or mood disorders The patients weight, height, BMI have been recorded in the chart. I have made referrals, counseling and provided education to the patient based on review of the above and I have provided the pt with a written personalized care plan for preventive services. Provider list updated.. See scanned questionairre as needed for further documentation. Reviewed preventative protocols and updated unless pt declined.       PAD (peripheral artery disease) (HCC)    Continue pletal and statin. Known intermittent claudication.      Relevant Orders   Lipid panel   Orthostatic hypotension    Continue florinef 0.1mg  BID. Doing well overall, no significant recent dizzy episodes.      Relevant Orders   Basic metabolic panel   Advanced care planning/counseling discussion    Advanced directives: has living will at home -done through attorney. HCPOA is wife then son and then oldest daughter etc. Advanced directives in chart (10/2012). Does not want artificial nutrition/hydration or  prolonged life support       Irregular heart rate    Pending 48 hr event monitor. Appreciate cards care.       Other Visit Diagnoses    Low vitamin B12 level        Relevant Orders    Vitamin B12    HLD (hyperlipidemia)        Relevant Orders    Lipid panel        Follow up plan: Return in about 6 months (around 02/21/2016), or as needed, for follow up visit.  Ria Bush, MD

## 2015-08-21 NOTE — Assessment & Plan Note (Signed)

## 2015-08-21 NOTE — Patient Instructions (Addendum)
Blood work today. Return as needed or in 6 months for follow up visit.  You are doing well today.   Health Maintenance, Male A healthy lifestyle and preventative care can promote health and wellness.  Maintain regular health, dental, and eye exams.  Eat a healthy diet. Foods like vegetables, fruits, whole grains, low-fat dairy products, and lean protein foods contain the nutrients you need and are low in calories. Decrease your intake of foods high in solid fats, added sugars, and salt. Get information about a proper diet from your health care provider, if necessary.  Regular physical exercise is one of the most important things you can do for your health. Most adults should get at least 150 minutes of moderate-intensity exercise (any activity that increases your heart rate and causes you to sweat) each week. In addition, most adults need muscle-strengthening exercises on 2 or more days a week.   Maintain a healthy weight. The body mass index (BMI) is a screening tool to identify possible weight problems. It provides an estimate of body fat based on height and weight. Your health care provider can find your BMI and can help you achieve or maintain a healthy weight. For males 20 years and older:  A BMI below 18.5 is considered underweight.  A BMI of 18.5 to 24.9 is normal.  A BMI of 25 to 29.9 is considered overweight.  A BMI of 30 and above is considered obese.  Maintain normal blood lipids and cholesterol by exercising and minimizing your intake of saturated fat. Eat a balanced diet with plenty of fruits and vegetables. Blood tests for lipids and cholesterol should begin at age 35 and be repeated every 5 years. If your lipid or cholesterol levels are high, you are over age 65, or you are at high risk for heart disease, you may need your cholesterol levels checked more frequently.Ongoing high lipid and cholesterol levels should be treated with medicines if diet and exercise are not  working.  If you smoke, find out from your health care provider how to quit. If you do not use tobacco, do not start.  Lung cancer screening is recommended for adults aged 49-80 years who are at high risk for developing lung cancer because of a history of smoking. A yearly low-dose CT scan of the lungs is recommended for people who have at least a 30-pack-year history of smoking and are current smokers or have quit within the past 15 years. A pack year of smoking is smoking an average of 1 pack of cigarettes a day for 1 year (for example, a 30-pack-year history of smoking could mean smoking 1 pack a day for 30 years or 2 packs a day for 15 years). Yearly screening should continue until the smoker has stopped smoking for at least 15 years. Yearly screening should be stopped for people who develop a health problem that would prevent them from having lung cancer treatment.  If you choose to drink alcohol, do not have more than 2 drinks per day. One drink is considered to be 12 oz (360 mL) of beer, 5 oz (150 mL) of wine, or 1.5 oz (45 mL) of liquor.  Avoid the use of street drugs. Do not share needles with anyone. Ask for help if you need support or instructions about stopping the use of drugs.  High blood pressure causes heart disease and increases the risk of stroke. High blood pressure is more likely to develop in:  People who have blood pressure in the  end of the normal range (100-139/85-89 mm Hg).  People who are overweight or obese.  People who are African American.  If you are 70-73 years of age, have your blood pressure checked every 3-5 years. If you are 37 years of age or older, have your blood pressure checked every year. You should have your blood pressure measured twice--once when you are at a hospital or clinic, and once when you are not at a hospital or clinic. Record the average of the two measurements. To check your blood pressure when you are not at a hospital or clinic, you can  use:  An automated blood pressure machine at a pharmacy.  A home blood pressure monitor.  If you are 9-5 years old, ask your health care provider if you should take aspirin to prevent heart disease.  Diabetes screening involves taking a blood sample to check your fasting blood sugar level. This should be done once every 3 years after age 82 if you are at a normal weight and without risk factors for diabetes. Testing should be considered at a younger age or be carried out more frequently if you are overweight and have at least 1 risk factor for diabetes.  Colorectal cancer can be detected and often prevented. Most routine colorectal cancer screening begins at the age of 9 and continues through age 58. However, your health care provider may recommend screening at an earlier age if you have risk factors for colon cancer. On a yearly basis, your health care provider may provide home test kits to check for hidden blood in the stool. A small camera at the end of a tube may be used to directly examine the colon (sigmoidoscopy or colonoscopy) to detect the earliest forms of colorectal cancer. Talk to your health care provider about this at age 27 when routine screening begins. A direct exam of the colon should be repeated every 5-10 years through age 41, unless early forms of precancerous polyps or small growths are found.  People who are at an increased risk for hepatitis B should be screened for this virus. You are considered at high risk for hepatitis B if:  You were born in a country where hepatitis B occurs often. Talk with your health care provider about which countries are considered high risk.  Your parents were born in a high-risk country and you have not received a shot to protect against hepatitis B (hepatitis B vaccine).  You have HIV or AIDS.  You use needles to inject street drugs.  You live with, or have sex with, someone who has hepatitis B.  You are a man who has sex with other  men (MSM).  You get hemodialysis treatment.  You take certain medicines for conditions like cancer, organ transplantation, and autoimmune conditions.  Hepatitis C blood testing is recommended for all people born from [birth year] through 1963 and any individual with known risk factors for hepatitis C.  Healthy men should no longer receive prostate-specific antigen (PSA) blood tests as part of routine cancer screening. Talk to your health care provider about prostate cancer screening.  Testicular cancer screening is not recommended for adolescents or adult males who have no symptoms. Screening includes self-exam, a health care provider exam, and other screening tests. Consult with your health care provider about any symptoms you have or any concerns you have about testicular cancer.  Practice safe sex. Use condoms and avoid high-risk sexual practices to reduce the spread of sexually transmitted infections (STIs).  You should  be screened for STIs, including gonorrhea and chlamydia if:  You are sexually active and are younger than 24 years.  You are older than 24 years, and your health care provider tells you that you are at risk for this type of infection.  Your sexual activity has changed since you were last screened, and you are at an increased risk for chlamydia or gonorrhea. Ask your health care provider if you are at risk.  If you are at risk of being infected with HIV, it is recommended that you take a prescription medicine daily to prevent HIV infection. This is called pre-exposure prophylaxis (PrEP). You are considered at risk if:  You are a man who has sex with other men (MSM).  You are a heterosexual man who is sexually active with multiple partners.  You take drugs by injection.  You are sexually active with a partner who has HIV.  Talk with your health care provider about whether you are at high risk of being infected with HIV. If you choose to begin PrEP, you should first be tested  for HIV. You should then be tested every 3 months for as long as you are taking PrEP.  Use sunscreen. Apply sunscreen liberally and repeatedly throughout the day. You should seek shade when your shadow is shorter than you. Protect yourself by wearing long sleeves, pants, a wide-brimmed hat, and sunglasses year round whenever you are outdoors.  Tell your health care provider of new moles or changes in moles, especially if there is a change in shape or color. Also, tell your health care provider if a mole is larger than the size of a pencil eraser.  A one-time screening for abdominal aortic aneurysm (AAA) and surgical repair of large AAAs by ultrasound is recommended for men aged 19-75 years who are current or former smokers.  Stay current with your vaccines (immunizations).   This information is not intended to replace advice given to you by your health care provider. Make sure you discuss any questions you have with your health care provider.   Document Released: 09/06/2007 Document Revised: 03/31/2014 Document Reviewed: 08/05/2010 Elsevier Interactive Patient Education [employer].

## 2015-08-21 NOTE — Assessment & Plan Note (Signed)
Nonischemic. Followed by cards.

## 2015-08-21 NOTE — Assessment & Plan Note (Signed)
Advanced directives: has living will at home -done through attorney. HCPOA is wife then son and then oldest daughter etc. Advanced directives in chart (10/2012). Does not want artificial nutrition/hydration or prolonged life support

## 2015-08-21 NOTE — Patient Instructions (Addendum)
Medication Instructions:   Your physician recommends that you continue on your current medications as directed. Please refer to the Current Medication list given to you today.   If you need a refill on your cardiac medications before your next appointment, please call your pharmacy.  Labwork:  NONE ORDER TODAY    Testing/Procedures:  Your physician has requested that you have en exercise stress myoview. For further information please visit [email or web address]. Please follow instruction sheet, as given.  Your physician has recommended that you wear a 48 HOUR  holter monitor. Holter monitors are medical devices that record the heart's electrical activity. Doctors most often use these monitors to diagnose arrhythmias. Arrhythmias are problems with the speed or rhythm of the heartbeat. The monitor is a small, portable device. You can wear one while you do your normal daily activities. This is usually used to diagnose what is causing palpitations/syncope (passing out).   Follow-Up: IN ONE MONTH WITH RENEE URSUY   Any Other Special Instructions Will Be Listed Below (If Applicable).  YOU HAVE BEEN RECCOMMENDED TO TRY TO AVOID ANY LADDER REQUIRED DUTIES

## 2015-08-22 ENCOUNTER — Telehealth (HOSPITAL_COMMUNITY): Payer: Self-pay | Admitting: *Deleted

## 2015-08-22 LAB — LIPID PANEL
CHOLESTEROL: 144 mg/dL (ref 0–200)
HDL: 63.2 mg/dL (ref >39.00)
LDL CALC: 72 mg/dL (ref 0–99)
NonHDL: 80.51
TRIGLYCERIDES: 43 mg/dL (ref 0.0–149.0)
Total CHOL/HDL Ratio: 2
VLDL: 8.6 mg/dL (ref 0.0–40.0)

## 2015-08-22 LAB — CBC WITH DIFFERENTIAL/PLATELET
BASOS PCT: 0.7 % (ref 0.0–3.0)
Basophils Absolute: 0 10*3/uL (ref 0.0–0.1)
EOS ABS: 0.2 10*3/uL (ref 0.0–0.7)
Eosinophils Relative: 4.3 % (ref 0.0–5.0)
HEMATOCRIT: 35.7 % — AB (ref 39.0–52.0)
Hemoglobin: 12.1 g/dL — ABNORMAL LOW (ref 13.0–17.0)
LYMPHS ABS: 1.2 10*3/uL (ref 0.7–4.0)
LYMPHS PCT: 29.5 % (ref 12.0–46.0)
MCHC: 33.9 g/dL (ref 30.0–36.0)
MCV: 96.1 fl (ref 78.0–100.0)
MONOS PCT: 12.3 % — AB (ref 3.0–12.0)
Monocytes Absolute: 0.5 10*3/uL (ref 0.1–1.0)
NEUTROS ABS: 2.2 10*3/uL (ref 1.4–7.7)
Neutrophils Relative %: 53.2 % (ref 43.0–77.0)
PLATELETS: 203 10*3/uL (ref 150.0–400.0)
RBC: 3.72 Mil/uL — ABNORMAL LOW (ref 4.22–5.81)
RDW: 13.4 % (ref 11.5–15.5)
WBC: 4.1 10*3/uL (ref 4.0–10.5)

## 2015-08-22 LAB — BASIC METABOLIC PANEL
BUN: 22 mg/dL (ref 6–23)
CO2: 28 mEq/L (ref 19–32)
CREATININE: 1.2 mg/dL (ref 0.40–1.50)
Calcium: 8.7 mg/dL (ref 8.4–10.5)
Chloride: 104 mEq/L (ref 96–112)
GFR: 62.03 mL/min (ref >60.00)
Glucose, Bld: 80 mg/dL (ref 70–99)
Potassium: 3.6 mEq/L (ref 3.5–5.1)
Sodium: 139 mEq/L (ref 135–145)

## 2015-08-22 LAB — IBC PANEL
IRON: 115 ug/dL (ref 42–165)
SATURATION RATIOS: 41.1 % (ref 20.0–50.0)
TRANSFERRIN: 200 mg/dL — AB (ref 212.0–360.0)

## 2015-08-22 LAB — FERRITIN: FERRITIN: 27.6 ng/mL (ref 22.0–322.0)

## 2015-08-22 LAB — VITAMIN B12: VITAMIN B 12: 940 pg/mL — AB (ref 211–911)

## 2015-08-22 NOTE — Telephone Encounter (Signed)
Patient's wife given detailed instructions per Myocardial Perfusion Study Information Sheet for the test on 08/27/15. Patient notified to arrive 15 minutes early and that it is imperative to arrive on time for appointment to keep from having the test rescheduled.  If you need to cancel or reschedule your appointment, please call the office within 24 hours of your appointment. Failure to do so may result in a cancellation of your appointment, and a $50 no show fee. Patient verbalized understanding.Hubbard Robinson, RN

## 2015-08-24 NOTE — Addendum Note (Signed)
Addended by: Freada Bergeron on: 08/24/2015 09:13 AM   Modules accepted: Orders

## 2015-08-25 ENCOUNTER — Other Ambulatory Visit: Payer: Self-pay | Admitting: Family Medicine

## 2015-08-25 DIAGNOSIS — Z1211 Encounter for screening for malignant neoplasm of colon: Secondary | ICD-10-CM

## 2015-08-27 ENCOUNTER — Ambulatory Visit (INDEPENDENT_AMBULATORY_CARE_PROVIDER_SITE_OTHER): Payer: Medicare Other

## 2015-08-27 ENCOUNTER — Ambulatory Visit (HOSPITAL_COMMUNITY): Payer: Medicare Other | Attending: Cardiology

## 2015-08-27 ENCOUNTER — Other Ambulatory Visit: Payer: Self-pay | Admitting: Physician Assistant

## 2015-08-27 DIAGNOSIS — R42 Dizziness and giddiness: Secondary | ICD-10-CM | POA: Insufficient documentation

## 2015-08-27 DIAGNOSIS — I4510 Unspecified right bundle-branch block: Secondary | ICD-10-CM | POA: Insufficient documentation

## 2015-08-27 DIAGNOSIS — R55 Syncope and collapse: Secondary | ICD-10-CM

## 2015-08-27 DIAGNOSIS — I119 Hypertensive heart disease without heart failure: Secondary | ICD-10-CM | POA: Insufficient documentation

## 2015-08-27 DIAGNOSIS — I429 Cardiomyopathy, unspecified: Secondary | ICD-10-CM

## 2015-08-27 DIAGNOSIS — I428 Other cardiomyopathies: Secondary | ICD-10-CM

## 2015-08-27 DIAGNOSIS — I739 Peripheral vascular disease, unspecified: Secondary | ICD-10-CM

## 2015-08-27 LAB — MYOCARDIAL PERFUSION IMAGING
CHL CUP NUCLEAR SRS: 3
CHL CUP NUCLEAR SSS: 6
CSEPPHR: 91 {beats}/min
LHR: 0.32
LV dias vol: 216 mL (ref 62–150)
LVSYSVOL: 139 mL
Rest HR: 63 {beats}/min
SDS: 3
TID: 1.03

## 2015-08-27 MED ORDER — REGADENOSON 0.4 MG/5ML IV SOLN
0.4000 mg | Freq: Once | INTRAVENOUS | Status: AC
  Administered 2015-08-27: 0.4 mg via INTRAVENOUS

## 2015-08-27 MED ORDER — TECHNETIUM TC 99M TETROFOSMIN IV KIT
10.8000 | PACK | Freq: Once | INTRAVENOUS | Status: AC | PRN
  Administered 2015-08-27: 11 via INTRAVENOUS
  Filled 2015-08-27: qty 11

## 2015-08-27 MED ORDER — TECHNETIUM TC 99M TETROFOSMIN IV KIT
32.7000 | PACK | Freq: Once | INTRAVENOUS | Status: AC | PRN
  Administered 2015-08-27: 32.7 via INTRAVENOUS
  Filled 2015-08-27: qty 33

## 2015-08-29 ENCOUNTER — Telehealth: Payer: Self-pay | Admitting: *Deleted

## 2015-08-29 NOTE — Telephone Encounter (Signed)
SPOKE WITH PATIENT

## 2015-08-29 NOTE — Telephone Encounter (Signed)
-----   Message from [place], [place] sent at 08/28/2015  4:56 PM EDT ----- Please let the patient know his stress test showed his heart muscle somewhat weakened again (similar to his older echos) and I would like to have him come in to discuss it and treatment options.  Give him an appointment in the next week or so please.  Thanks [employer]

## 2015-09-10 ENCOUNTER — Ambulatory Visit (INDEPENDENT_AMBULATORY_CARE_PROVIDER_SITE_OTHER): Payer: Medicare Other | Admitting: Physician Assistant

## 2015-09-10 ENCOUNTER — Encounter: Payer: Self-pay | Admitting: Physician Assistant

## 2015-09-10 VITALS — BP 124/48 | HR 70 | Ht 67.5 in | Wt 152.0 lb

## 2015-09-10 DIAGNOSIS — I429 Cardiomyopathy, unspecified: Secondary | ICD-10-CM | POA: Diagnosis not present

## 2015-09-10 DIAGNOSIS — I428 Other cardiomyopathies: Secondary | ICD-10-CM

## 2015-09-10 DIAGNOSIS — I951 Orthostatic hypotension: Secondary | ICD-10-CM | POA: Diagnosis not present

## 2015-09-10 DIAGNOSIS — I739 Peripheral vascular disease, unspecified: Secondary | ICD-10-CM

## 2015-09-10 NOTE — Progress Notes (Signed)
Cardiology Office Note Date:  09/10/2015  Patient ID:  Adam Clements, Adam Clements [birth year], MRN [id number] PCP:  Ria Bush, MD  Cardiologist/Electrophysiologist: Dr. Rayann Heman   Chief Complaint: fatigue, f/u on his stress test result  History of Present Illness: Adam Clements is a 81 y.o. male with history of orthostatic hypotension on florinef, PVD followed by Dr. Trula Slade, HLD and expressive aphasia that he has seen neurology for.  He was last seen by Dr. Rayann Heman in March at that time doing fairly well, reported a syncopal event while shaving his florinef increased to BID dosing, recommended to have ILR but the patient wanted to hold off and declined 30day monitor.  He was seen for Dr. Rayann Heman, by myself May 30th  doing "OK", in regards to his syncope, he has had none further, has daily symptoms of lightheadedness that passes quickly this is at his baseline for years and has noted when checking his BP at home, since starting the new medicine his change in BP when standing is much better with less change/drop.  He had mentioned though that his exertional capacity is less in the last month or so.  He works very hard physically, Copywriter, advertising for [employer], caring for his horses, throwing hay and so on, going to the gym 3x week and feels like he needs to stop and "gather himself" this is more a feeling of physical weakness, no CP, not SOB, but weak, and not like his lightheaded symptoms.  This seems to be new symptom for him different then his orthostatic changes.  He  continues to deny any kind of CP, will occasionally feel palpitations, denies SOB, PND, DOE, or symptoms of orthopnea, he has not fainted.  And reports that he was instructed to take the florinef BID if he noted his BP < 110 at his last visit he felt like this had been better, though today states he has in-fact made a correlation between his exsertional intolerance to lob BP readings.    Past Medical History  Diagnosis Date    . Allergic rhinitis   . Diverticulosis of colon     conoscopy 12/1993 (Dr. Amedeo Plenty) S/G divertics//colonoscopy L&R divertics (Dr. Amedeo Plenty) 11/08/2004  . PVC (premature ventricular contraction)   . Bradycardia   . Gout   . HTN (hypertension)   . PVD (peripheral vascular disease) ([place])     followed by Dr Trula Slade, treating medically (pletal and aspirin)  . Mitral regurgitation     Pulmonic regurgitation, with marked LA enlargement  . CAD (coronary artery disease)     nonobstructive  . Nonischemic cardiomyopathy ([facility])     EF 40%  . Arthritis     Gout  . BPH (benign prostatic hypertrophy)   . Chronic anemia 2013    h/o IDA, did not tolerate oral iron, latest normal iron studies, B12, folate  . RBBB longstanding  . Squamous cell carcinoma in situ of skin 2015    L ear  . Syncope   . Dizziness   . Memory loss   . Orthostatic hypotension     Past Surgical History  Procedure Laterality Date  . Appendectomy    . Dexa  2004    "shrunk 2 inches", WNL  . 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 cholecalciferol (VITAMIN D) 1000 units tablet Take 1,000 Units by mouth daily.    . cilostazol (PLETAL) 50 MG tablet take 1 tablet by  mouth twice a day 60 tablet 11  . dipyridamole-aspirin (AGGRENOX) 200-25 MG 12hr capsule take 1 capsule by mouth twice a day 60 capsule 11  . fludrocortisone (FLORINEF) 0.1 MG tablet Take 0.1 mg by mouth 2 (two) times daily.     . pravastatin (PRAVACHOL) 20 MG tablet take 1 tablet by mouth once daily 30 tablet 6  . psyllium (FIBER THERAPY) 0.52 G capsule Take 1 capsule by mouth daily as needed.     . vitamin B-12 (CYANOCOBALAMIN) 500 MCG tablet Take 500 mcg by mouth daily.     No current facility-administered medications for this visit.    Allergies:   Atorvastatin   Social History:  The patient  reports that he quit smoking about 32 years ago. His smoking use included Cigarettes. He  smoked 2.00 packs per day. He has quit using smokeless tobacco. He reports that he drinks about 8.4 oz of alcohol per week. He reports that he does not use illicit drugs.   Family History:  The patient's family history includes Heart attack in his brother; Heart disease in his brother; Heart disease (age of onset: 67) in his mother; Hyperlipidemia in his brother; Hypertension in his brother, father, and mother; Stroke in his father.  ROS:  Please see the history of present illness.   All other systems are reviewed and otherwise negative.   PHYSICAL EXAM:  VS:  There were no vitals taken for this visit. BMI: There is no weight on file to calculate BMI. Well nourished, well developed, in no acute distress HEENT: normocephalic, atraumatic Neck: no JVD, carotid bruits or masses Cardiac:  normal S1, S2; RRR; no significant murmurs, no rubs, or gallops Lungs:  clear to auscultation bilaterally, no wheezing, rhonchi or rales Abd: soft, nontender MS: no deformity or atrophy Ext: no edema Skin: warm and dry, no rash Neuro:  No gross deficits appreciated Psych: euthymic mood, full affect   EKG:  4/7/117: SR, RBBB, today's appears unchanged  08/27/15: stress test Study Highlights     Nuclear stress EF: 36%.  This is an intermediate risk study.  The left ventricular ejection fraction is moderately decreased (30-44%).  LV is dilated with moderate diffuse hypokinesis. There is no evidence of scar or ischemia.      08/27/15: 48hour holter Study Highlights    Sinus rhythm with bundle branch block Frequent premature atrial contractions and rare premature ventricular contractions Rare nonsustained atrial tachycardia Rare nonsustained ventricular tachycardia No atrial fibrillation     07/10/15: Echocardiogram Study Conclusions - Left ventricle: The cavity size was normal. Wall thickness was  increased in a pattern of moderate LVH. Systolic function was  mildly reduced. The  estimated ejection fraction was in the range  of 45% to 50%. Doppler parameters are consistent with abnormal  left ventricular relaxation (grade 1 diastolic dysfunction). The  E/e&' ratio is between 8-15, suggesting indeterminate LV filling  pressure. Ejection fraction (MOD, 2-plane): 47%. - Aortic valve: Trileaflet. Sclerosis without stenosis. There was  trivial regurgitation. - Mitral valve: Mildly thickened leaflets . There was mild  regurgitation. - Left atrium: Severely dilated at 58 ml/m2. - Inferior vena cava: The vessel was normal in size. The  respirophasic diameter changes were in the normal range (>= 50%),  consistent with normal central venous pressure. Impressions: - Compared to the prior study in 2014, the EF has improved to  45-50%.  07/30/10: EF 45% 11/02/08 EF 40% 07/19/08: EF 40%  Recent Labs: 01/02/2015: TSH 1.18 02/05/2015: ALT 15* 08/21/2015:  BUN 22; Creatinine, Ser 1.20; Hemoglobin 12.1*; Platelets 203.0; Potassium 3.6; Sodium 139  01/02/2015: Direct LDL 73.0 08/21/2015: Cholesterol 144; HDL 63.20; LDL Cholesterol 72; Total CHOL/HDL Ratio 2; Triglycerides 43.0; VLDL 8.6   CrCl cannot be calculated (Unknown ideal weight.).   Wt Readings from Last 3 Encounters:  08/21/15 151 lb 8 oz (68.72 kg)  08/21/15 154 lb (69.854 kg)  06/29/15 159 lb (72.122 kg)     Other studies reviewed: Additional studies/records reviewed today include: summarized above  ASSESSMENT AND PLAN:  1. Hypotension, postural hypotension      Has dysautonomia and chronic hypotension      He reports a correlation of his c/o exertional intolerance/weakness to findings of lower BP again at home      + orthostatic change here today again without symptoms      He is instructed to not climb ladders or roof work etc that would put him at fall/injury risk      He wears support stokcings      Counseled on standing slowly, avoiding long periods of standing and to sit if symptoms  2.  Nonischemic CM     NYHA Class I/II     Symptomatic postural hypotension limits our ability to treat     F/u echo with improved EF      3. PAD     Follows with Dr Trula Slade     On Aggrenox, Pletal, statin  4. Intermittent Expressive aphasia, confusion     Followed by neurology     Felt secondary to recurrent hypoperfusion secondary to episodes of hypotension  5. Symptom as described     Reportedly NOD by cath though this apparently many years ago     EKG today appeared unchanged     Stress test without noted ischemic, EF 30-44% discussed given his known PVD the possibility of CAD, he had no ischemia on his stress test, though concerns of anginal equivalent were discussed.  The patient does not want to pursue cardiac cath at this time, he fees like his low BP is the etiology.  He wears his support stockings routinely.  Disposition:  Increase the fludrocortisone to 0.1mg  TID, he is instructed to monitor for the development of any swelling of his LE and let us know if so.  We will see him back in 62mo, sooner if needed.   Current medicines are reviewed at length with the patient today.  The patient did not have any concerns regarding medicines.  Haywood Lasso, PA-C 09/10/2015 6:17 AM     [facility] [address] [place] [address] [phone] (office)  [phone] (fax)

## 2015-09-10 NOTE — Patient Instructions (Signed)
Medication Instructions:   START TAKING FLUDROCORTISONE THREE TIMES A DAY   If you need a refill on your cardiac medications before your next appointment, please call your pharmacy.  Labwork: NONE ORDER TODAY    Testing/Procedures:  NONE ORDER TODAY    Follow-Up IN 4 MONTHS WITH RENEE   Any Other Special Instructions Will Be Listed Below (If Applicable).  MONITOR FOR ANY LEG SWELLING AND CONTACT OFFICE TO UPDATE ON SYMPTOMS IF YOU HAVE ANY

## 2015-09-24 ENCOUNTER — Ambulatory Visit: Payer: Medicare Other | Admitting: Physician Assistant

## 2015-09-30 ENCOUNTER — Encounter: Payer: Self-pay | Admitting: Family Medicine

## 2015-10-08 ENCOUNTER — Encounter: Payer: Self-pay | Admitting: Family Medicine

## 2015-10-08 ENCOUNTER — Ambulatory Visit (INDEPENDENT_AMBULATORY_CARE_PROVIDER_SITE_OTHER)
Admission: RE | Admit: 2015-10-08 | Discharge: 2015-10-08 | Disposition: A | Discharge status: Home / Self Care | Payer: Medicare Other | Source: Ambulatory Visit | Attending: Family Medicine | Admitting: Family Medicine

## 2015-10-08 ENCOUNTER — Ambulatory Visit (INDEPENDENT_AMBULATORY_CARE_PROVIDER_SITE_OTHER): Payer: Medicare Other | Admitting: Family Medicine

## 2015-10-08 VITALS — BP 136/72 | HR 53 | Temp 97.5°F | Resp 18 | Ht 67.5 in | Wt 158.0 lb

## 2015-10-08 DIAGNOSIS — I5033 Acute on chronic diastolic (congestive) heart failure: Secondary | ICD-10-CM

## 2015-10-08 DIAGNOSIS — J90 Pleural effusion, not elsewhere classified: Secondary | ICD-10-CM | POA: Diagnosis not present

## 2015-10-08 MED ORDER — FUROSEMIDE 20 MG PO TABS: 10.0000 mg | ORAL_TABLET | Freq: Every day | ORAL | Status: DC | PRN

## 2015-10-08 NOTE — Patient Instructions (Addendum)
Chest xray today. Decrease fludrocortisone to once daily for 3 days then return to twice daily dosing. Take lasix water pill 1/2 tablet daily for next 3 days as well.  Update me if not doing better for further evaluation No bronchitis infection today.  Good to see you today, call us with questions.

## 2015-10-08 NOTE — Progress Notes (Signed)
Pre visit review using our clinic review tool, if applicable. No additional management support is needed unless otherwise documented below in the visit note. 

## 2015-10-08 NOTE — Progress Notes (Signed)
BP 136/72 mmHg  Pulse 53  Temp(Src) 97.5 F (36.4 C) (Oral)  Resp 18  Ht 5' 7.5" (1.715 m)  Wt 158 lb (71.668 kg)  BMI 24.37 kg/m2  SpO2 94%   CC: chest congestion  Subjective:    Patient ID: Adam Clements, male    DOB: 1934-07-14, 81 y.o.   MRN: [id number]  HPI: Adam Clements is a 81 y.o. male presenting on 10/08/2015 for Nasal Congestion   3 day history of dyspnea, chest congestion. Initially attributed to allergies. However it has been worsening. Over last 2 nights noticing worsening orthopnea as well as progressive dyspnea. Some head congestion and PNDrainage. Some dizziness as well as cough mildly productive of sputum. Some chest tightness/pressure. Has increased to 4 pillows at night time. 6lb weight gain noted.  No fevers/chills, ST, HA.   Hasn't tried anything for this yet.  No sick contacts at home.  No smokers at home.  No h/o asthma.  Known nonobstructive CAD.   Has been taking fludrocortisone TID, but today backed down to BID dosing after he noticed mild pedal edema.   Relevant past medical, surgical, family and social history reviewed and updated as indicated. Interim medical history since our last visit reviewed. Allergies and medications reviewed and updated. Current Outpatient Prescriptions on File Prior to Visit  Medication Sig  . cholecalciferol (VITAMIN D) 1000 units tablet Take 1,000 Units by mouth daily.  . cilostazol (PLETAL) 50 MG tablet take 1 tablet by mouth twice a day  . dipyridamole-aspirin (AGGRENOX) 200-25 MG 12hr capsule take 1 capsule by mouth twice a day  . pravastatin (PRAVACHOL) 20 MG tablet take 1 tablet by mouth once daily  . psyllium (FIBER THERAPY) 0.52 G capsule Take 1 capsule by mouth daily as needed.   . vitamin B-12 (CYANOCOBALAMIN) 500 MCG tablet Take 500 mcg by mouth daily.   No current facility-administered medications on file prior to visit.    Review of Systems Per HPI unless specifically indicated in ROS section       Objective:    BP 136/72 mmHg  Pulse 53  Temp(Src) 97.5 F (36.4 C) (Oral)  Resp 18  Ht 5' 7.5" (1.715 m)  Wt 158 lb (71.668 kg)  BMI 24.37 kg/m2  SpO2 94%  Wt Readings from Last 3 Encounters:  10/08/15 158 lb (71.668 kg)  09/10/15 152 lb (68.947 kg)  08/21/15 151 lb 8 oz (68.72 kg)    Physical Exam  Constitutional: He appears well-developed and well-nourished. No distress.  HENT:  Head: Normocephalic and atraumatic.  Right Ear: Hearing, tympanic membrane, external ear and ear canal normal.  Left Ear: Hearing, tympanic membrane, external ear and ear canal normal.  Nose: Nose normal. No mucosal edema or rhinorrhea. Right sinus exhibits no maxillary sinus tenderness and no frontal sinus tenderness. Left sinus exhibits no maxillary sinus tenderness and no frontal sinus tenderness.  Mouth/Throat: Uvula is midline, oropharynx is clear and moist and mucous membranes are normal. No oropharyngeal exudate, posterior oropharyngeal edema, posterior oropharyngeal erythema or tonsillar abscesses.  Eyes: Conjunctivae and EOM are normal. Pupils are equal, round, and reactive to light. No scleral icterus.  Neck: Normal range of motion. Neck supple. Hepatojugular reflux (mild) and JVD (mild) present.  Cardiovascular: Normal rate, regular rhythm, normal heart sounds and intact distal pulses.   No murmur heard. Pulmonary/Chest: Effort normal and breath sounds normal. No respiratory distress. He has no wheezes. He has no rales.  Crackles RLL  Lymphadenopathy:  He has no cervical adenopathy.  Skin: Skin is warm and dry. No rash noted.  Nursing note and vitals reviewed.     Assessment & Plan:   Problem List Items Addressed This Visit    Acute on chronic diastolic congestive heart failure (HCC) - Primary    Clinical acute CHF exacerbation after increased fludrocortisone dose to TID. I asked him to return to QD dosing, take lasix 10mg  daily for 3d then PRN pedal edema/dyspnea. Check CXR today to  further eval for CHF.  If not improving with treatment, I have asked him to let us know to return for labwork and likely f/u sooner with cardiology. Pt agrees with plan.       Relevant Medications   furosemide (LASIX) 20 MG tablet   Other Relevant Orders   DG Chest 2 View       Follow up plan: Return if symptoms worsen or fail to improve.  Ria Bush, MD

## 2015-10-08 NOTE — Assessment & Plan Note (Signed)
Clinical acute CHF exacerbation after increased fludrocortisone dose to TID. I asked him to return to QD dosing, take lasix 10mg  daily for 3d then PRN pedal edema/dyspnea. Check CXR today to further eval for CHF.  If not improving with treatment, I have asked him to let us know to return for labwork and likely f/u sooner with cardiology. Pt agrees with plan.

## 2015-10-22 ENCOUNTER — Telehealth: Payer: Self-pay | Admitting: Internal Medicine

## 2015-10-22 NOTE — Telephone Encounter (Signed)
Patient complaining of SOB for about a month or so. Patient stated that Tommye Standard PA started him on Fludrocortisone TID, but things did not improve with this change. Patient saw Dr. Danise Mina, who decreased fludrocrotisone to daily for 3 days, then BID, and had him take lasix 1/2 tablet daily for 3 days. Patient stated this helped him with edema in BLE, and sleeping at night. Patient stated he started having symptoms again of SOB, worse in the morning, and trouble sleeping at night. Patient stated he has decreased energy and congestion. Patient denies chest pain, but states he has discomfort at night when laying down. Patient is worried that this is going to be the new normal for him. Patient has an appointment with Tommye Standard PA this Thursday. Will forward to Tommye Standard PA for advisement.

## 2015-10-22 NOTE — Telephone Encounter (Signed)
NEw Message  Pt c/o Shortness Of Breath: STAT if SOB developed within the last 24 hours or pt is noticeably SOB on the phone  1. Are you currently SOB (can you hear that pt is SOB on the phone)? Pt states yes  2. How long have you been experiencing SOB? Pt states for about a week or two   3. Are you SOB when sitting or when up moving around? Pt states Both sitting and moving around   4. Are you currently experiencing any other symptoms? Pt states he just does not feel like hisself.

## 2015-10-24 ENCOUNTER — Telehealth: Payer: Self-pay | Admitting: *Deleted

## 2015-10-24 NOTE — Telephone Encounter (Signed)
SPOKE TO PATIENT ABOUT SOB AND HOW HE WAS FEELING AND RE CONFIRMING APPT  TOMORROW  WITH RENEE .Marland Kitchen  PT STATED HE WAS DOING OKAY RIGHT NOW AND LATER YESTERDAY EVENING HE BEGAN TO FEEL BETTER. BUT HE WOULD LIKE TO DISCUSS MORE WHEN HE COMES IN FOR HIS OV WITH URSUY

## 2015-10-25 ENCOUNTER — Ambulatory Visit (INDEPENDENT_AMBULATORY_CARE_PROVIDER_SITE_OTHER): Payer: Medicare Other | Admitting: Physician Assistant

## 2015-10-25 ENCOUNTER — Encounter: Payer: Self-pay | Admitting: Physician Assistant

## 2015-10-25 VITALS — BP 112/66 | HR 79 | Ht 67.0 in | Wt 157.0 lb

## 2015-10-25 DIAGNOSIS — I951 Orthostatic hypotension: Secondary | ICD-10-CM | POA: Diagnosis not present

## 2015-10-25 DIAGNOSIS — I420 Dilated cardiomyopathy: Secondary | ICD-10-CM

## 2015-10-25 DIAGNOSIS — I429 Cardiomyopathy, unspecified: Secondary | ICD-10-CM

## 2015-10-25 DIAGNOSIS — R0602 Shortness of breath: Secondary | ICD-10-CM

## 2015-10-25 DIAGNOSIS — I5023 Acute on chronic systolic (congestive) heart failure: Secondary | ICD-10-CM

## 2015-10-25 LAB — BASIC METABOLIC PANEL
BUN: 24 mg/dL (ref 7–25)
CALCIUM: 8.5 mg/dL — AB (ref 8.6–10.3)
CO2: 26 mmol/L (ref 20–31)
Chloride: 107 mmol/L (ref 98–110)
Creat: 1.25 mg/dL — ABNORMAL HIGH (ref 0.70–1.18)
Glucose, Bld: 80 mg/dL (ref 65–99)
Potassium: 3.4 mmol/L — ABNORMAL LOW (ref 3.5–5.3)
SODIUM: 140 mmol/L (ref 135–146)

## 2015-10-25 NOTE — Progress Notes (Signed)
Cardiology Office Note Date:  10/25/2015  Patient ID:  Adam, Clements July 19, 1934, MRN [id number] PCP:  Ria Bush, MD  Cardiologist/Electrophysiologist: Dr. Rayann Heman   Chief Complaint:  f/u on med changes  History of Present Illness: Adam Clements is a 81 y.o. male with history of orthostatic hypotension on florinef, PVD followed by Dr. Trula Slade, HLD and expressive aphasia that he has seen neurology for.  He was last seen by Dr. Rayann Heman in March at that time doing fairly well, reported a syncopal event while shaving his florinef increased to BID dosing, recommended to have ILR but the patient wanted to hold off and declined 30day monitor.  He was seen for Dr. Rayann Heman, by myself May 30th  doing "OK", in regards to his syncope, he has had none further, has daily symptoms of lightheadedness that passes quickly this is at his baseline for years and has noted when checking his BP at home, since starting the new medicine his change in BP when standing is much better with less change/drop.  He had mentioned though that his exertional capacity is less in the last month or so.  He works very hard physically, Copywriter, advertising for [employer], caring for his horses, throwing hay and so on, going to the gym 3x week and feels like he needs to stop and "gather himself" this is more a feeling of physical weakness, no CP, not SOB, but weak, and not like his lightheaded symptoms.  This seemed to be new symptom for him different then his orthostatic changes and was referred for stress testing.  He was seen by myself in f/u 09/09/25 to f/u on this.    08/27/15 stress showed dilated LV EF 30-44%, diffuse hypokinesis and no ischemia.  The patient after further discussion given his known PVD the possibility of CAD, he had no ischemia on his stress test, though concerns of anginal equivalent were discussed.  The patient did not want to pursue cardiac cath at that time, he felt like his low BP is the etiology.  He  wears his support stockings routinely, his florinef was increased to TID.  Since then he saw his PMD with SOB and felt to have CHF exacerbation his florinef decreased to daily and given lasix temporarily.  CXR was done via his PMD, noting COPD with superimposed small b/l pleural effusions and bibasilar atelectasis or early pneumonia.  He comes in today to be seen again for Dr. Rayann Heman, he reports after decreasing the medication and taking a few days of lasix he clearly felt improved, but c/w with particularly nighttime when lying on his left side a feeling of SOB/and a slight pressure to his chest, not noticed when on his right side or supine, and not exertional.  Generally feeling improved from when he saw his PMD.  He reports though also that his BP has generally been better without getting the very low readings he had in the past and has not had any near syncope or syncope.  No palpitations.  His weight is still up, he reports his LE edema improved after the lasix as well. He denies any symptoms of illness, no fever/chills, no cough.  He reports sinus congestion and post nasal gtt with environ mental allergies that bother him only.  Past Medical History:  Diagnosis Date  . Allergic rhinitis   . Arthritis    Gout  . BPH (benign prostatic hypertrophy)   . Bradycardia   . CAD (coronary artery disease)  nonobstructive  . Chronic anemia 2013   h/o IDA, did not tolerate oral iron, latest normal iron studies, B12, folate  . Diverticulosis of colon    conoscopy 12/1993 (Dr. Amedeo Plenty) S/G divertics//colonoscopy L&R divertics (Dr. Amedeo Plenty) 11/08/2004  . Dizziness   . Gout   . HTN (hypertension)   . Memory loss   . Mitral regurgitation    Pulmonic regurgitation, with marked LA enlargement  . Nonischemic cardiomyopathy (HCC)    EF 40%  . Orthostatic hypotension   . PVC (premature ventricular contraction)   . PVD (peripheral vascular disease) ([place])    followed by Dr Trula Slade, treating medically  (pletal and aspirin)  . RBBB longstanding  . Squamous cell carcinoma in situ of skin 2015   L ear  . Syncope     Past Surgical History:  Procedure Laterality Date  . 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 APPENDECTOMY    . dexa  2004   "shrunk 2 inches", WNL    Current Outpatient Prescriptions  Medication Sig Dispense Refill  . cholecalciferol (VITAMIN D) 1000 units tablet Take 1,000 Units by mouth daily.    . cilostazol (PLETAL) 50 MG tablet take 1 tablet by mouth twice a day 60 tablet 11  . dipyridamole-aspirin (AGGRENOX) 200-25 MG 12hr capsule take 1 capsule by mouth twice a day 60 capsule 11  . fludrocortisone (FLORINEF) 0.1 MG tablet Take 1 tablet (0.1 mg total) by mouth 2 (two) times daily.    . furosemide (LASIX) 20 MG tablet Take 0.5 tablets (10 mg total) by mouth daily as needed for edema. 10 tablet 0  . pravastatin (PRAVACHOL) 20 MG tablet take 1 tablet by mouth once daily 30 tablet 6  . psyllium (FIBER THERAPY) 0.52 G capsule Take 1 capsule by mouth daily as needed.     . vitamin B-12 (CYANOCOBALAMIN) 500 MCG tablet Take 500 mcg by mouth daily.     No current facility-administered medications for this visit.     Allergies:   Atorvastatin   Social History:  The patient  reports that he quit smoking about 32 years ago. His smoking use included Cigarettes. He smoked 2.00 packs per day. He has quit using smokeless tobacco. He reports that he drinks about 8.4 oz of alcohol per week . He reports that he does not use drugs.   Family History:  The patient's family history includes Heart attack in his brother; Heart disease in his brother; Heart disease (age of onset: 25) in his mother; Hyperlipidemia in his brother; Hypertension in his brother, father, and mother; Stroke in his father.  ROS:  Please see the history of present illness.   All other systems are reviewed and otherwise negative.   PHYSICAL EXAM:  VS:  BP 112/66   Pulse 79   Ht 5\' 7"  (1.702 m)   Wt 157 lb  (71.2 kg)   BMI 24.59 kg/m  BMI: Body mass index is 24.59 kg/m. Well nourished, well developed, in no acute distress  HEENT: normocephalic, atraumatic  Neck: no JVD, carotid bruits or masses Cardiac:  normal S1, S2; RRR; extrasystoles noted, no significant murmurs, no rubs, or gallops Lungs:  clear to auscultation bilaterally, no wheezing, rhonchi or rales  Abd: soft, nontender MS: no deformity or atrophy Ext: trace edema  Skin: warm and dry, no rash Neuro:  No gross deficits appreciated Psych: euthymic mood, full affect   EKG:  Today and reviewed by myself, is SR, RBBB, APCs, VPC, otherwise appears unchanged  from previous.  08/27/15: stress test Study Highlights     Nuclear stress EF: 36%.  This is an intermediate risk study.  The left ventricular ejection fraction is moderately decreased (30-44%).  LV is dilated with moderate diffuse hypokinesis. There is no evidence of scar or ischemia.      08/27/15: 48hour holter Study Highlights    Sinus rhythm with bundle branch block Frequent premature atrial contractions and rare premature ventricular contractions Rare nonsustained atrial tachycardia Rare nonsustained ventricular tachycardia No atrial fibrillation     07/10/15: Echocardiogram Study Conclusions - Left ventricle: The cavity size was normal. Wall thickness was  increased in a pattern of moderate LVH. Systolic function was  mildly reduced. The estimated ejection fraction was in the range  of 45% to 50%. Doppler parameters are consistent with abnormal  left ventricular relaxation (grade 1 diastolic dysfunction). The  E/e&' ratio is between 8-15, suggesting indeterminate LV filling  pressure. Ejection fraction (MOD, 2-plane): 47%. - Aortic valve: Trileaflet. Sclerosis without stenosis. There was  trivial regurgitation. - Mitral valve: Mildly thickened leaflets . There was mild  regurgitation. - Left atrium: Severely dilated at 58 ml/m2. - Inferior  vena cava: The vessel was normal in size. The  respirophasic diameter changes were in the normal range (>= 50%),  consistent with normal central venous pressure. Impressions: - Compared to the prior study in 2014, the EF has improved to  45-50%.  07/30/10: EF 45% 11/02/08 EF 40% 07/19/08: EF 40%  Recent Labs: 01/02/2015: TSH 1.18 02/05/2015: ALT 15 08/21/2015: BUN 22; Creatinine, Ser 1.20; Hemoglobin 12.1; Platelets 203.0; Potassium 3.6; Sodium 139  01/02/2015: Direct LDL 73.0 08/21/2015: Cholesterol 144; HDL 63.20; LDL Cholesterol 72; Total CHOL/HDL Ratio 2; Triglycerides 43.0; VLDL 8.6   CrCl cannot be calculated (Patient's most recent lab result is older than the maximum 21 days allowed.).   Wt Readings from Last 3 Encounters:  10/25/15 157 lb (71.2 kg)  10/08/15 158 lb (71.7 kg)  09/10/15 152 lb (68.9 kg)     Other studies reviewed: Additional studies/records reviewed today include: summarized above  ASSESSMENT AND PLAN:  1. Hypotension, postural hypotension      Has dysautonomia and chronic hypotension      He reports his BP generally has been better, none <100      He has been instructed to not climb ladders or roof work etc that would put him at fall/injury risk      He wears support stoccings      re-counseled on standing slowly, avoiding long periods of standing and to sit if symptoms  2. Nonischemic CM     NYHA Class I/II with recent exacerbation     Symptomatic postural hypotension limits our ability to treat     F/u echo with improved EF     Exacerbation with increased florinef dose     He is still 5lbs up from his last visit here, lungs clear, trace edema, nighttime symptoms likely some orthopnea      3. PAD     Follows with Dr Trula Slade     On Aggrenox, Pletal, statin  4. Intermittent Expressive aphasia, confusion     Followed by neurology     Felt secondary to recurrent hypoperfusion secondary to episodes of hypotension  5. Symptom as described      Reportedly NOD by cath though this apparently many years ago     EKG today again appeared unchanged     Stress test without noted ischemic, EF 30-44%  Re-discussed given his known PVD the possibility of CAD, he had no ischemia on his stress test, though possibilityof anginal equivalent, false negative stress were discussed.  The patient would like to continue to hold off on cardiac cath at this time since he is not having any daytime/exertional symptoms.  He wears his support stockings routinely.  Disposition:  3 more days of lasix 10mg  daily, BMET today, he is asked to monitor his symptoms, if no improvement or any escalation to let us know or seek medical attention if needed, otherwise we will see him in 6 weeks, if his symptoms have resolved and feeling OK, he will move it out to 3-41months.  F/u with PMD regarding sinuses.  Current medicines are reviewed at length with the patient today.  The patient did not have any concerns regarding medicines.  Haywood Lasso, PA-C 10/25/2015 9:56 AM     [facility] [address] [place] [address] [phone] (office)  [phone] (fax)

## 2015-10-25 NOTE — Patient Instructions (Addendum)
Medication Instructions:   TAKE LASIX  10 MG  FOR THREE DAYS ONLY THEN ONLY AS NEEDED  If you need a refill on your cardiac medications before your next appointment, please call your pharmacy.  Labwork: BNET TODAY    Testing/Procedures: NONE ORDER TODAY    Follow-Up: 6 WEEKS WITH RENEE URSUY OR ALLRED    Any Other Special Instructions Will Be Listed Below (If Applicable).  MONITOR YOUR SYMPTOMS AFTER THIS VISIT TO DETERMINE IF YOU WOULD LIKE TO KEEP 6 WEEKS FOLLOW UP OR COME BACK LATER

## 2015-10-26 ENCOUNTER — Other Ambulatory Visit: Payer: Self-pay | Admitting: *Deleted

## 2015-10-26 ENCOUNTER — Telehealth: Payer: Self-pay | Admitting: *Deleted

## 2015-10-26 MED ORDER — POTASSIUM CHLORIDE ER 10 MEQ PO TBCR: 10.0000 meq | EXTENDED_RELEASE_TABLET | Freq: Every day | ORAL | 6 refills | Status: DC

## 2015-10-26 NOTE — Telephone Encounter (Signed)
-----   Message from [place], [place] sent at 10/25/2015  6:06 PM EDT ----- Please let the patient know his kidney function looks similar to his previous labs historically, his potassium is just a little low.  Call in and have him take Kdur 25meq po daily only on the days he takes the furosemide please.  Thanks [employer]

## 2015-10-26 NOTE — Telephone Encounter (Signed)
LMOVM TO CALL BACK FOR RESULTS 

## 2015-10-26 NOTE — Telephone Encounter (Signed)
-----   Message from [place], [place] sent at 10/25/2015  6:06 PM EDT ----- Please let the patient know his kidney function looks similar to his previous labs historically, his potassium is just a little low.  Call in and have him take Kdur 25meq po daily only on the days he takes the furosemide please.  Thanks [employer]

## 2015-10-26 NOTE — Telephone Encounter (Signed)
Spoke to pt about lab results and medications that was sent into pharmacy kdur 10 meq  to start taking only on  days when taking lasix

## 2015-11-03 DIAGNOSIS — Z23 Encounter for immunization: Secondary | ICD-10-CM | POA: Diagnosis not present

## 2015-11-12 ENCOUNTER — Telehealth: Payer: Self-pay

## 2015-11-12 NOTE — Telephone Encounter (Signed)
[place] request immunization dates for prevnar, pnemovax and tdap; info given from immunization record.nothing further needed.

## 2015-11-22 ENCOUNTER — Encounter: Payer: Self-pay | Admitting: Internal Medicine

## 2015-12-10 ENCOUNTER — Ambulatory Visit (INDEPENDENT_AMBULATORY_CARE_PROVIDER_SITE_OTHER): Payer: Medicare Other | Admitting: Internal Medicine

## 2015-12-10 ENCOUNTER — Encounter: Payer: Self-pay | Admitting: Internal Medicine

## 2015-12-10 VITALS — BP 102/64 | HR 50 | Ht 67.5 in | Wt 154.8 lb

## 2015-12-10 DIAGNOSIS — I951 Orthostatic hypotension: Secondary | ICD-10-CM | POA: Diagnosis not present

## 2015-12-10 DIAGNOSIS — I429 Cardiomyopathy, unspecified: Secondary | ICD-10-CM | POA: Diagnosis not present

## 2015-12-10 DIAGNOSIS — R5383 Other fatigue: Secondary | ICD-10-CM | POA: Diagnosis not present

## 2015-12-10 DIAGNOSIS — I739 Peripheral vascular disease, unspecified: Secondary | ICD-10-CM | POA: Diagnosis not present

## 2015-12-10 DIAGNOSIS — I428 Other cardiomyopathies: Secondary | ICD-10-CM

## 2015-12-10 NOTE — Progress Notes (Signed)
PCP: Ria Bush, MD  The patient presents today for routine followup.   His claudication is stable.  Orthostasis is much improved with florinef.  No recent syncope.  His concern today is with lack of energy.  He has occasional edema and finds that he is SOB in the AM after sleeping.  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  The patient denies symptoms of palpitations, chest pain,   PND, lower extremity edema, presyncope, or recent syncope.  The patient is tolerating medications without difficulties and is otherwise without complaint today.    Past Medical History:  Diagnosis Date  . Allergic rhinitis   . Arthritis    Gout  . BPH (benign prostatic hypertrophy)   . Bradycardia   . CAD (coronary artery disease)    nonobstructive  . Chronic anemia 2013   h/o IDA, did not tolerate oral iron, latest normal iron studies, B12, folate  . Diverticulosis of colon    conoscopy 12/1993 (Dr. Amedeo Plenty) S/G divertics//colonoscopy L&R divertics (Dr. Amedeo Plenty) 11/08/2004  . Dizziness   . Gout   . HTN (hypertension)   . Memory loss   . Mitral regurgitation    Pulmonic regurgitation, with marked LA enlargement  . Nonischemic cardiomyopathy (HCC)    EF 40%  . Orthostatic hypotension   . PVC (premature ventricular contraction)   . PVD (peripheral vascular disease) ([place])    followed by Dr Trula Slade, treating medically (pletal and aspirin)  . RBBB longstanding  . Squamous cell carcinoma in situ of skin 2015   L ear  . Syncope    Past Surgical History:  Procedure Laterality Date  . 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 APPENDECTOMY    . dexa  2004   "shrunk 2 inches", WNL    Current Outpatient Prescriptions  Medication Sig Dispense Refill  . cholecalciferol (VITAMIN D) 1000 units tablet Take 1,000 Units by mouth daily.    . cilostazol (PLETAL) 50 MG tablet take 1 tablet by mouth twice a day 60 tablet 11    . dipyridamole-aspirin (AGGRENOX) 200-25 MG 12hr capsule take 1 capsule by mouth twice a day 60 capsule 11  . fludrocortisone (FLORINEF) 0.1 MG tablet Take 1 tablet (0.1 mg total) by mouth 2 (two) times daily.    . furosemide (LASIX) 20 MG tablet Take 0.5 tablets (10 mg total) by mouth daily as needed for edema. 10 tablet 0  . pravastatin (PRAVACHOL) 20 MG tablet take 1 tablet by mouth once daily 30 tablet 6  . vitamin B-12 (CYANOCOBALAMIN) 500 MCG tablet Take 500 mcg by mouth daily.    . potassium chloride (K-DUR) 10 MEQ tablet Take 1 tablet (10 mEq total) by mouth daily. (Patient not taking: Reported on 12/10/2015) 30 tablet 6   No current facility-administered medications for this visit.     Allergies  Allergen Reactions  . Atorvastatin Other (See Comments)    Memory trouble, confusion, fatigue    Social History   Social History  . Marital status: Married    Spouse name: N/A  . Number of children: N/A  . Years of education: N/A   Occupational History  . Not on file.   Social History Main Topics  . Smoking status: Former Smoker    Packs/day: 2.00    Types: Cigarettes    Quit date: 11/10/1982  . Smokeless tobacco: Former Systems developer  . Alcohol use 8.4 oz/week  10 Glasses of wine, 4 Cans of beer per week     Comment: 1-2 drinks/day, prior to 2005 more  . Drug use: No  . Sexual activity: No   Other Topics Concern  . Not on file   Social History Narrative   Lives with wife [name]). Dog passed away 11-07-12.   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 HCPOA is wife then son and then oldest daughter etc. Advanced directives in chart 2012-11-07). Does not want artificial nutrition/hydration or prolonged life support      Does have stairs in  home. BS Degree    Family History  Problem Relation Age of Onset  . Heart disease Mother 74    Natural causes with CHF  . Hypertension Mother   . Hypertension Father   . Stroke Father     cerebral hemorrhage  . Hyperlipidemia Brother   . Heart disease Brother     before age 18  . Hypertension Brother   . Heart attack Brother   . Hypertension       Physical Exam: Vitals:   12/10/15 0941  BP: 102/64  Pulse: (!) 50  Weight: 154 lb 12.8 oz (70.2 kg)  Height: 5' 7.5" (1.715 m)    GEN- The patient is well appearing, alert and oriented x 3 today.   Head- normocephalic, atraumatic Eyes-  Sclera clear, conjunctiva pink Ears- hearing intact Oropharynx- clear Neck- supple, no JVP, no bruit Lymph- no cervical lymphadenopathy Lungs- Clear to ausculation bilaterally, normal work of breathing Heart- Regular rate and rhythm, no murmurs, rubs or gallops, PMI not laterally displaced,diminished DP/PT pulses today GI- soft, NT, ND, + BS Extremities- no clubbing, cyanosis, or edema, extremities warm and cool  Prior ecgs are reviewed, prior echo, and myoview reviewed Renee Ursuy's notes reviewed  Assessment and Plan:  1. Hypotension Has dysautonomia and chronic hypotension improved with florinef. Will reduce florinef to 0.1mg  daily at this time Keep off of lasix and follow Support hose encouraged  2. Nonischemic CM NYHA Class I/II Symptomatic postural hypotension limits our ability to treat Given advanced age, would not favor ICD in this patient for primary prevention  3. Postural dizziness Hopefully will remain stable with reduced florinef  4. PAD Follows with Dr Trula Slade Could consider stopping pletal which may be contributing to edema  5. Expression aphasia Followed by neurology  Return to see EP PA in 4 months EP issues are stable Return to see me in 8 months  Thompson Grayer MD, [facility] 12/10/2015 10:38 AM

## 2015-12-10 NOTE — Patient Instructions (Signed)
Medication Instructions:  Your physician has recommended you make the following change in your medication:  1) Decrease Florinef to 0.1 mg daily   Labwork: None ordered   Testing/Procedures: None ordered   Follow-Up: Your physician recommends that you schedule a follow-up appointment in: 4 months with Dillon Bjork, PA and 8 months with Dr Rayann Heman   Any Other Special Instructions Will Be Listed Below (If Applicable).     If you need a refill on your cardiac medications before your next appointment, please call your pharmacy.

## 2016-01-18 ENCOUNTER — Other Ambulatory Visit: Payer: Self-pay | Admitting: Family Medicine

## 2016-01-29 ENCOUNTER — Telehealth: Payer: Self-pay | Admitting: Family Medicine

## 2016-01-29 ENCOUNTER — Ambulatory Visit (INDEPENDENT_AMBULATORY_CARE_PROVIDER_SITE_OTHER): Payer: Medicare Other | Admitting: Family Medicine

## 2016-01-29 ENCOUNTER — Encounter: Payer: Self-pay | Admitting: Family Medicine

## 2016-01-29 VITALS — BP 152/96 | HR 56 | Temp 98.0°F | Wt 150.2 lb

## 2016-01-29 DIAGNOSIS — I5030 Unspecified diastolic (congestive) heart failure: Secondary | ICD-10-CM | POA: Diagnosis not present

## 2016-01-29 DIAGNOSIS — E538 Deficiency of other specified B group vitamins: Secondary | ICD-10-CM

## 2016-01-29 DIAGNOSIS — I951 Orthostatic hypotension: Secondary | ICD-10-CM | POA: Diagnosis not present

## 2016-01-29 DIAGNOSIS — R002 Palpitations: Secondary | ICD-10-CM | POA: Diagnosis not present

## 2016-01-29 DIAGNOSIS — Z7189 Other specified counseling: Secondary | ICD-10-CM

## 2016-01-29 DIAGNOSIS — F4489 Other dissociative and conversion disorders: Secondary | ICD-10-CM | POA: Diagnosis not present

## 2016-01-29 DIAGNOSIS — R42 Dizziness and giddiness: Secondary | ICD-10-CM | POA: Diagnosis not present

## 2016-01-29 DIAGNOSIS — I499 Cardiac arrhythmia, unspecified: Secondary | ICD-10-CM

## 2016-01-29 LAB — POC URINALSYSI DIPSTICK (AUTOMATED)
BILIRUBIN UA: NEGATIVE
GLUCOSE UA: NEGATIVE
KETONES UA: NEGATIVE
Leukocytes, UA: NEGATIVE
Nitrite, UA: NEGATIVE
Protein, UA: NEGATIVE
Urobilinogen, UA: 0.2
pH, UA: 5.5

## 2016-01-29 NOTE — Patient Instructions (Addendum)
Bring me copy of your health care power of attorney form.  EKG today Labs and urine checked today.  We will try and get you back to see Dr Loretta Plume this month.  Continue meds as up to now.  Continue to stay well hydrated.  If this happens again, check blood pressures.

## 2016-01-29 NOTE — Telephone Encounter (Signed)
Scheduled today at 3pm

## 2016-01-29 NOTE — Progress Notes (Signed)
Pre visit review using our clinic review tool, if applicable. No additional management support is needed unless otherwise documented below in the visit note. 

## 2016-01-29 NOTE — Assessment & Plan Note (Signed)
Holter 2017 - Sinus rhythm with bundle branch block, frequent premature atrial contractions and rare premature ventricular contractions, rare nonsustained atrial tachycardia, rare nonsustained ventricular tachycardia but no atrial fibrillation Update EKG - see below.

## 2016-01-29 NOTE — Assessment & Plan Note (Addendum)
Anticipate cause of symptoms - see below.  Caution with increased dose - as it has previously contributed to acute CHF Orthostatic vital signs + on exam today.

## 2016-01-29 NOTE — Progress Notes (Signed)
BP (!) 152/96 (BP Location: Left Arm, Cuff Size: Normal)   Pulse (!) 56   Temp 98 F (36.7 C) (Oral)   Wt 150 lb 4 oz (68.2 kg)   BMI 23.19 kg/m    CC: dizziness Subjective:    Patient ID: Adam Clements, male    DOB: 1935/03/01, 81 y.o.   MRN: [id number]  HPI: Adam Clements is a 81 y.o. male presenting on 01/29/2016 for Dizziness   Brings advanced directive today for chart. [name] wife would be HCPOA. Did not bring HCPOA form.   He had been feeling well on florinef once daily and off lasix until 2 recent episodes - first 10/28 and then 11/6 - of lightheadedness and confusion. Denies LOC. First episode he was alone, had episode of bowel incontinence but did not fall. Vision was impaired and he had short term memory loss. After 1 hour he felt better. Second episode he woke up at 5:15am (his routine) and felt confused when he went into the bathroom. Describes confusion with blank spots in vision "couldn't see where to put my razor". This resolved after 30 minutes and he went to [employer] (regular work).   Known dysautonomia with chronic hypotension and nonischemic cardiomyopathy. Has seen Dr Rayann Heman. When we increased florinef to TID dosing, this led to acute CHF exacerbation.   Denies chest pain/tightness, shortness of breath, palpitations, headaches.  Noted weight drop over last few weeks, slowly regaining. Some darker more frequent urine, denies dysuria or hematuria.   He continues working for [employer] for [employer], climing ladders, aware of risks but desires to continue this regardless.   Relevant past medical, surgical, family and social history reviewed and updated as indicated. Interim medical history since our last visit reviewed. Allergies and medications reviewed and updated. Current Outpatient Prescriptions on File Prior to Visit  Medication Sig  . cholecalciferol (VITAMIN D) 1000 units tablet Take 1,000 Units by mouth daily.  . cilostazol (PLETAL) 50 MG tablet  take 1 tablet by mouth twice a day  . dipyridamole-aspirin (AGGRENOX) 200-25 MG 12hr capsule take 1 capsule by mouth twice a day  . fludrocortisone (FLORINEF) 0.1 MG tablet Take 0.1 mg by mouth daily.  . pravastatin (PRAVACHOL) 20 MG tablet take 1 tablet by mouth once daily  . vitamin B-12 (CYANOCOBALAMIN) 500 MCG tablet Take 500 mcg by mouth daily.   No current facility-administered medications on file prior to visit.     Review of Systems Per HPI unless specifically indicated in ROS section     Objective:    BP (!) 152/96 (BP Location: Left Arm, Cuff Size: Normal)   Pulse (!) 56   Temp 98 F (36.7 C) (Oral)   Wt 150 lb 4 oz (68.2 kg)   BMI 23.19 kg/m   Wt Readings from Last 3 Encounters:  01/29/16 150 lb 4 oz (68.2 kg)  12/10/15 154 lb 12.8 oz (70.2 kg)  10/25/15 157 lb (71.2 kg)    Physical Exam  Constitutional: He is oriented to person, place, and time. He appears well-developed and well-nourished. No distress.  HENT:  Head: Normocephalic and atraumatic.  Mouth/Throat: Oropharynx is clear and moist. No oropharyngeal exudate.  Eyes: Conjunctivae are normal. Pupils are equal, round, and reactive to light.  Neck: Normal range of motion. Neck supple. Carotid bruit is not present. No thyromegaly present.  Cardiovascular: Normal heart sounds and intact distal pulses.  An irregular rhythm present. Bradycardia present.   No murmur heard. Pulmonary/Chest: Effort normal  and breath sounds normal. No respiratory distress. He has no wheezes. He has no rales.  Musculoskeletal: He exhibits no edema.  Lymphadenopathy:    He has no cervical adenopathy.  Neurological: He is alert and oriented to person, place, and time. He has normal strength. No cranial nerve deficit or sensory deficit. He displays a negative Romberg sign.  CN 2-12 intact FTN intact No pronator drift  Skin: Skin is warm and dry. No rash noted.  Psychiatric: He has a normal mood and affect.  Nursing note and vitals  reviewed.  Results for orders placed or performed in visit on 01/29/16  POCT Urinalysis Dipstick (Automated)  Result Value Ref Range   Color, UA Yellow    Clarity, UA Clear    Glucose, UA Negative    Bilirubin, UA Negative    Ketones, UA Negative    Spec Grav, UA >=1.030    Blood, UA Trace    pH, UA 5.5    Protein, UA Negative    Urobilinogen, UA 0.2    Nitrite, UA Negative    Leukocytes, UA Negative Negative   EKG - NSR rate 60s, normal axis, intervals, no acute ST/T changes. occasional PVCs    Assessment & Plan:   Problem List Items Addressed This Visit    (HFpEF) heart failure with preserved ejection fraction (HCC)    Nonischemic cardiomyopathy, followed by cardiology. Seems euvolemic off regular diuretic.       Advanced care planning/counseling discussion    Brings updated advanced directive without HCPOA - he will bring this in when found at home      Confusional state - Primary    Unclear etiology but given known h/o dysautonomia with orthostatic hypotension, anticipate symptoms due to this. Discussed with patient. Will update labs, check EKG and UA today.  Did not update head imaging as this was done last year 01/2015 and stable.  Suggested f/u sooner with neurology for further evaluation/recommendation. Caution with florinef - previously TID dosing led to acute CHF exacerbation.  Continue aggrenox and pravastatin.       Relevant Orders   EKG 12-Lead (Completed)   Comprehensive metabolic panel   TSH   CBC with Differential/Platelet   POCT Urinalysis Dipstick (Automated) (Completed)   Ambulatory referral to Neurology   Dizziness   Relevant Orders   POCT Urinalysis Dipstick (Automated) (Completed)   Irregular heart rate    Update EKG today - NSR rate 60s, normal axis, intervals, no acute ST/T changes. occasional PVCs      Orthostatic hypotension    Anticipate cause of symptoms - see below.  Caution with increased dose - as it has previously contributed to  acute CHF Orthostatic vital signs + on exam today.       Relevant Orders   POCT Urinalysis Dipstick (Automated) (Completed)   Ambulatory referral to Neurology   Palpitations    Holter 2017 - Sinus rhythm with bundle branch block, frequent premature atrial contractions and rare premature ventricular contractions, rare nonsustained atrial tachycardia, rare nonsustained ventricular tachycardia but no atrial fibrillation Update EKG - see below.       Relevant Orders   TSH    Other Visit Diagnoses    B12 deficiency       Relevant Orders   Vitamin B12       Follow up plan: No Follow-up on file.  Ria Bush, MD

## 2016-01-29 NOTE — Assessment & Plan Note (Signed)
Brings updated advanced directive without HCPOA - he will bring this in when found at home

## 2016-01-29 NOTE — Assessment & Plan Note (Addendum)
Unclear etiology but given known h/o dysautonomia with orthostatic hypotension, anticipate symptoms due to this. Discussed with patient. Will update labs, check EKG and UA today.  Did not update head imaging as this was done last year 01/2015 and stable.  Suggested f/u sooner with neurology for further evaluation/recommendation. Caution with florinef - previously TID dosing led to acute CHF exacerbation.  Continue aggrenox and pravastatin.

## 2016-01-29 NOTE — Assessment & Plan Note (Addendum)
Update EKG today - NSR rate 60s, normal axis, intervals, no acute ST/T changes. occasional PVCs

## 2016-01-29 NOTE — Assessment & Plan Note (Addendum)
Nonischemic cardiomyopathy, followed by cardiology. Seems euvolemic off regular diuretic.

## 2016-01-29 NOTE — Telephone Encounter (Signed)
I spoke with Adam Clements; offered him multiple appts with other providers but Adam Clements only wants to see [name]. Adam Clements states he feels fine today;no symptoms today. Adam Clements scheduled appt to see [name] on 02/01/16 @ 11:00 and if symptoms return Adam Clements will cb to [facility] or go to ED. FYI to [name].

## 2016-01-29 NOTE — Telephone Encounter (Signed)
Patient Name: Adam Clements  DOB: [birth year]    Initial Comment Caller states- confusion and cant understand   Nurse Assessment  Nurse: Leilani Merl, RN, Heather Date/Time (Eastern Time): 01/29/2016 10:01:17 AM  Confirm and document reason for call. If symptomatic, describe symptoms. You must click the next button to save text entered. ---Caller states that he has had 2 episodes of lightheadedness, confusion and vision change on 10/28 and 11/06. The episodes last about 15-30 min. He is concerned that he is having TIA's  Has the patient traveled out of the country within the last 30 days? ---Not Applicable  Does the patient have any new or worsening symptoms? ---Yes  Will a triage be completed? ---Yes  Related visit to physician within the last 2 weeks? ---No  Does the PT have any chronic conditions? (i.e. diabetes, asthma, etc.) ---Yes  List chronic conditions. ---See MR  Is this a behavioral health or substance abuse call? ---No     Guidelines    Guideline Title Affirmed Question Affirmed Notes  Confusion - Delirium [1] Acting confused (e.g., disoriented, slurred speech) AND [2] brief (now gone)    Final Disposition User   See Physician within 4 Hours (or PCP triage) Standifer, RN, Heather    Comments  Caller triaged to be seen within 4 hours, no appts available. Caller wants to be seen by Dr. Darnell Level today, he would like to be worked in to the schedule if possible, please call.   Referrals  REFERRED TO PCP OFFICE   Disagree/Comply: Comply

## 2016-01-30 DIAGNOSIS — H903 Sensorineural hearing loss, bilateral: Secondary | ICD-10-CM | POA: Diagnosis not present

## 2016-01-30 LAB — COMPREHENSIVE METABOLIC PANEL
ALBUMIN: 3.6 g/dL (ref 3.5–5.2)
ALT: 14 U/L (ref 0–53)
AST: 16 U/L (ref 0–37)
Alkaline Phosphatase: 49 U/L (ref 39–117)
BUN: 25 mg/dL — AB (ref 6–23)
CHLORIDE: 105 meq/L (ref 96–112)
CO2: 29 meq/L (ref 19–32)
Calcium: 8.8 mg/dL (ref 8.4–10.5)
Creatinine, Ser: 1.31 mg/dL (ref 0.40–1.50)
GFR: 55.99 mL/min — ABNORMAL LOW (ref >60.00)
Glucose, Bld: 105 mg/dL — ABNORMAL HIGH (ref 70–99)
POTASSIUM: 3.8 meq/L (ref 3.5–5.1)
SODIUM: 140 meq/L (ref 135–145)
Total Bilirubin: 0.5 mg/dL (ref 0.2–1.2)
Total Protein: 5.9 g/dL — ABNORMAL LOW (ref 6.0–8.3)

## 2016-01-30 LAB — CBC WITH DIFFERENTIAL/PLATELET
Basophils Absolute: 0 10*3/uL (ref 0.0–0.1)
Basophils Relative: 0.6 % (ref 0.0–3.0)
EOS PCT: 7.8 % — AB (ref 0.0–5.0)
Eosinophils Absolute: 0.3 10*3/uL (ref 0.0–0.7)
HCT: 36.6 % — ABNORMAL LOW (ref 39.0–52.0)
HEMOGLOBIN: 12.5 g/dL — AB (ref 13.0–17.0)
LYMPHS ABS: 1.1 10*3/uL (ref 0.7–4.0)
Lymphocytes Relative: 24.8 % (ref 12.0–46.0)
MCHC: 34.1 g/dL (ref 30.0–36.0)
MCV: 95.4 fl (ref 78.0–100.0)
MONOS PCT: 11.2 % (ref 3.0–12.0)
Monocytes Absolute: 0.5 10*3/uL (ref 0.1–1.0)
Neutro Abs: 2.5 10*3/uL (ref 1.4–7.7)
Neutrophils Relative %: 55.6 % (ref 43.0–77.0)
Platelets: 193 10*3/uL (ref 150.0–400.0)
RBC: 3.84 Mil/uL — AB (ref 4.22–5.81)
RDW: 12.9 % (ref 11.5–15.5)
WBC: 4.4 10*3/uL (ref 4.0–10.5)

## 2016-01-30 LAB — VITAMIN B12: VITAMIN B 12: 574 pg/mL (ref 211–911)

## 2016-01-30 LAB — TSH: TSH: 1.59 u[IU]/mL (ref 0.35–4.50)

## 2016-02-01 ENCOUNTER — Ambulatory Visit: Payer: Medicare Other | Admitting: Family Medicine

## 2016-02-05 ENCOUNTER — Ambulatory Visit (INDEPENDENT_AMBULATORY_CARE_PROVIDER_SITE_OTHER): Payer: Medicare Other | Admitting: Family Medicine

## 2016-02-05 ENCOUNTER — Encounter: Payer: Self-pay | Admitting: Family Medicine

## 2016-02-05 ENCOUNTER — Telehealth: Payer: Self-pay | Admitting: *Deleted

## 2016-02-05 VITALS — BP 110/58 | HR 64 | Temp 97.9°F | Wt 149.5 lb

## 2016-02-05 DIAGNOSIS — I951 Orthostatic hypotension: Secondary | ICD-10-CM

## 2016-02-05 NOTE — Telephone Encounter (Signed)
Patient had screener from life insurance company call. She was out to screen him for policy today and his BP was 180/110. He was denying HA, blurry vision, weakness, CP or dyspnea but she said he needed to be seen because his policy would get denied if BP was left untreated. No openings today. Per Dr. Ricardo Jericho to [facility] or could be seen tomorrow. Scheduled with DG for early AM as he also has appt with Dr. Tomi Likens.

## 2016-02-05 NOTE — Progress Notes (Signed)
   BP (!) 110/58 (BP Location: Right Arm, Cuff Size: Normal)   Pulse 64   Temp 97.9 F (36.6 C) (Oral)   Wt 149 lb 8 oz (67.8 kg)   BMI 23.07 kg/m    CC: HTN check Subjective:    Patient ID: Adam Clements, male    DOB: 08/01/1934, 81 y.o.   MRN: [id number]  HPI: Adam Clements is a 81 y.o. male presenting on 02/05/2016 for Hypertension   Recent syncopal episodes - see prior note for details. Referred back to neurology for re evaluation.   Seen this morning by life insurance company screening with elevated bp 180/110, pt was asymptomatic. Agent advised pt needed to be seen today. Known orthostatic hypotension along with hypertension. No HA, vision changes, CP/tightness, SOB, leg swelling. Denies dizziness or syncope.   Ran out of aggrenox 2 days ago. He's refilled this today.   Relevant past medical, surgical, family and social history reviewed and updated as indicated. Interim medical history since our last visit reviewed. Allergies and medications reviewed and updated. Current Outpatient Prescriptions on File Prior to Visit  Medication Sig  . cholecalciferol (VITAMIN D) 1000 units tablet Take 1,000 Units by mouth daily.  . cilostazol (PLETAL) 50 MG tablet take 1 tablet by mouth twice a day  . dipyridamole-aspirin (AGGRENOX) 200-25 MG 12hr capsule take 1 capsule by mouth twice a day  . fludrocortisone (FLORINEF) 0.1 MG tablet Take 0.1 mg by mouth daily.  . pravastatin (PRAVACHOL) 20 MG tablet take 1 tablet by mouth once daily  . vitamin B-12 (CYANOCOBALAMIN) 500 MCG tablet Take 500 mcg by mouth daily.   No current facility-administered medications on file prior to visit.     Review of Systems Per HPI unless specifically indicated in ROS section     Objective:    BP (!) 110/58 (BP Location: Right Arm, Cuff Size: Normal)   Pulse 64   Temp 97.9 F (36.6 C) (Oral)   Wt 149 lb 8 oz (67.8 kg)   BMI 23.07 kg/m   Wt Readings from Last 3 Encounters:  02/05/16 149 lb 8 oz  (67.8 kg)  01/29/16 150 lb 4 oz (68.2 kg)  12/10/15 154 lb 12.8 oz (70.2 kg)    Physical Exam  Constitutional: He appears well-developed and well-nourished. No distress.  Cardiovascular: Regular rhythm, normal heart sounds and intact distal pulses.  Bradycardia present.   No murmur heard. Pulmonary/Chest: Effort normal and breath sounds normal. No respiratory distress. He has no wheezes. He has no rales.  Nursing note and vitals reviewed.     Assessment & Plan:   Problem List Items Addressed This Visit    Orthostatic hypotension - Primary    Ongoing dysautonomia as evidenced by orthostatic vital signs today - supine 180/100, standing 110/58. Reviewed again with patient, continue florinef 0.1mg  daily. Suggested he have BP checked standing for upcoming life screen - but he regardless may not be approved for life insurance. Pt aware. Has f/u with neuro tomorrow morning (see prior note for details).          Follow up plan: Return if symptoms worsen or fail to improve.  Ria Bush, MD

## 2016-02-05 NOTE — Patient Instructions (Signed)
Your blood pressures fluctuate due to orthostatic vital signs - your body has trouble regulating blood pressures with position changes. This is why you're on florinef. Continue current medicines. Make sure to stay well hydrated. Let us know if dizziness, passing out recurs.

## 2016-02-05 NOTE — Telephone Encounter (Signed)
Scheduled for today.

## 2016-02-05 NOTE — Progress Notes (Signed)
Pre visit review using our clinic review tool, if applicable. No additional management support is needed unless otherwise documented below in the visit note. 

## 2016-02-05 NOTE — Assessment & Plan Note (Signed)
Ongoing dysautonomia as evidenced by orthostatic vital signs today - supine 180/100, standing 110/58. Reviewed again with patient, continue florinef 0.1mg  daily. Suggested he have BP checked standing for upcoming life screen - but he regardless may not be approved for life insurance. Pt aware. Has f/u with neuro tomorrow morning (see prior note for details).

## 2016-02-06 ENCOUNTER — Ambulatory Visit: Payer: Medicare Other | Admitting: Family Medicine

## 2016-02-06 ENCOUNTER — Ambulatory Visit (INDEPENDENT_AMBULATORY_CARE_PROVIDER_SITE_OTHER): Payer: Medicare Other | Admitting: Neurology

## 2016-02-06 ENCOUNTER — Encounter: Payer: Self-pay | Admitting: Neurology

## 2016-02-06 VITALS — BP 146/86 | HR 74 | Ht 67.0 in | Wt 145.0 lb

## 2016-02-06 DIAGNOSIS — G903 Multi-system degeneration of the autonomic nervous system: Secondary | ICD-10-CM | POA: Diagnosis not present

## 2016-02-06 DIAGNOSIS — F05 Delirium due to known physiological condition: Secondary | ICD-10-CM | POA: Diagnosis not present

## 2016-02-06 DIAGNOSIS — G9389 Other specified disorders of brain: Secondary | ICD-10-CM | POA: Diagnosis not present

## 2016-02-06 DIAGNOSIS — I679 Cerebrovascular disease, unspecified: Secondary | ICD-10-CM

## 2016-02-06 NOTE — Patient Instructions (Addendum)
I think the episodes were related to drop in blood pressure but we need to rule out other things too.  1.  MRI of brain. We have sent a referral to [facility] for your MRI and they will call you directly to schedule your appt. They are located at [place]. If you need to contact them directly please call [phone].  2.  EEG. We will schedule today.  3.  As per [place] law, you should not drive for 6 months after an episode of unexplained loss of awareness.  4.  We will repeat neurocognitive testing here (to be done prior to follow up in January)

## 2016-02-06 NOTE — Progress Notes (Signed)
NEUROLOGY FOLLOW UP OFFICE NOTE  [name] [id number]  HISTORY OF PRESENT ILLNESS: Adam Clements is a 81 year old right-handed male with neuro-cardiogenic orthostatic hypotension and PAD who follows up for recurrent episodes of confusion.  Neuropsychological testing report, EEG and labs reviewed.   UPDATE:  He is on B12 supplementation.  B12 from 01/30/16 was 574.  TSH was 1.59.  For orthostatic hypotension, he is on Florinef.  He had two confusional episodes over the past month.  On the morning of 01/19/16, he woke up and found himself in the bathroom leaning over the sink.  He had central vision loss (able to see in the periphery) and noticed that he had a bowel movement on the floor.  He returned to baseline in about 15 minutes.  On 01/6216, he had a similar event, however he did not lose awareness.  He got up in the morning and walked to the bathroom.  He was standing at the sink when he felt lightheaded and noted central vision loss.  It lasted for a shorter period of time, only several minutes.  He did not have a bowel movement or urinary incontinence.  His blood pressure machine had not been working well, so he wasn't able to accurately check his blood pressure at the time.  Yesterday, he was seen by his PCP.  Orthostatics were positive (180/100 supine, 160/80 sitting and 110/58 standing).   HISTORY: He was diagnosed with neurocardiogenic orthostatic hypotension after experiencing several syncopal events over the past year.  He would often pass out after period of exertion or frequent voiding.   He has been treated with Florinef.  Since the summer, he has had recurrent episodes of difficulty with speech output.  They seem to correlate with low blood pressure.  On 01/01/15, he developed confusion.  He was preparing for a colonoscopy and had trouble understanding the report.  He also had trouble with verbal output and was slurring his speech.  The following morning, he had right-sided  headache.  Blood pressure was found to be 64/49.  EKG showed NSR of 60 bpm.  He was treated with IV fluids and felt better.  His PCP changed his ASA 325mg  to Aggrenox for suspected TIA.  LDL was 73.  TSH 1.18.  He was started on atorvastatin 20mg  daily.  CT of head showed no acute intracranial process.  Carotid doppler showed no hemodynamically significant ICA stenosis.  On the morning of 02/05/15, he developed confusion.  He had trouble figuring out how to put in his hearing aids.  He didn't feel well.  He presented to the ED, where CT of head showed chronic left frontal sinusitis but no acute intracranial process.  UA was negative.  Manifestation of his orthostatic hypotension was suspected and it was suggested that his Florinef should be adjusted.  He followed up with his PCP.  MRI and MRA of head was performed on 02/07/15 showed no acute infarct or intracranial stenosis.  Over the past year, he notes increased fatigue.  He exhibits symptoms of REM sleep behavior disorder.  He will often thrash his arms when dreaming about being attacked.  This is recurrent.  He has cardiomyopathy with an EF of 35-40%. Holter monitor revealed sinus rhythm with bundle branch block, frequent PACs, rare PVCs, rare nonsustained atrial tachycardia and rare nonsustained ventricular tachycardia.  No atrial fibrillation.  Other than when he passes out, he denies falls or gait instability.   He also reports gradually worsening short-term memory  as well.  He reports that he needs to constantly write things down.  When he is distracted for a moment, he will forget what he needed to do.  He denies problems performing everyday tasks, disorientation while driving, or remembering names or faces.  He used to abuse alcohol but currently drinks one glass of wine a day.  He denies family history of dementia.  His mother had epilepsy.  TSH was 1.18.  B12 was in low-end of normal at 243, but methylmalonic acid level was normal at 153.  He was  advised to start B12 1000 mcg daily.   EEG from 03/05/15 was normal.  He had neuropsychological testing at [facility] on 04/10/15.  Testing suggested mild unspecified mental disorder as demonstrated by deficits in semantic fluency with weakness in verbal memory encoding and recognition.  Recurrent episodes of hypotension likely the cause of this.  PAST MEDICAL HISTORY: Past Medical History:  Diagnosis Date  . Allergic rhinitis   . Arthritis    Gout  . BPH (benign prostatic hypertrophy)   . Bradycardia   . CAD (coronary artery disease)    nonobstructive  . Chronic anemia 2013   h/o IDA, did not tolerate oral iron, latest normal iron studies, B12, folate  . Diverticulosis of colon    conoscopy 12/1993 (Dr. Amedeo Plenty) S/G divertics//colonoscopy L&R divertics (Dr. Amedeo Plenty) 11/08/2004  . Dizziness   . Gout   . HTN (hypertension)   . Memory loss   . Mitral regurgitation    Pulmonic regurgitation, with marked LA enlargement  . Nonischemic cardiomyopathy (HCC)    EF 40%  . Orthostatic hypotension   . PVC (premature ventricular contraction)   . PVD (peripheral vascular disease) ([place])    followed by Dr Trula Slade, treating medically (pletal and aspirin)  . RBBB longstanding  . Squamous cell carcinoma in situ of skin 2015   L ear  . Syncope     MEDICATIONS: Current Outpatient Prescriptions on File Prior to Visit  Medication Sig Dispense Refill  . cholecalciferol (VITAMIN D) 1000 units tablet Take 1,000 Units by mouth daily.    . cilostazol (PLETAL) 50 MG tablet take 1 tablet by mouth twice a day 60 tablet 11  . dipyridamole-aspirin (AGGRENOX) 200-25 MG 12hr capsule take 1 capsule by mouth twice a day 60 capsule 11  . fludrocortisone (FLORINEF) 0.1 MG tablet Take 0.1 mg by mouth daily.    . pravastatin (PRAVACHOL) 20 MG tablet take 1 tablet by mouth once daily 30 tablet 6  . vitamin B-12 (CYANOCOBALAMIN) 500 MCG tablet Take 500 mcg by mouth daily.     No current facility-administered medications  on file prior to visit.     ALLERGIES: Allergies  Allergen Reactions  . Atorvastatin Other (See Comments)    Memory trouble, confusion, fatigue    FAMILY HISTORY: Family History  Problem Relation Age of Onset  . Heart disease Mother 18    Natural causes with CHF  . Hypertension Mother   . Hypertension Father   . Stroke Father     cerebral hemorrhage  . Hyperlipidemia Brother   . Heart disease Brother     before age 39  . Hypertension Brother   . Heart attack Brother   . Hypertension      SOCIAL HISTORY: Social History   Social History  . Marital status: Married    Spouse name: N/A  . Number of children: N/A  . Years of education: N/A   Occupational History  . Not on  file.   Social History Main Topics  . Smoking status: Former Smoker    Packs/day: 2.00    Types: Cigarettes    Quit date: 11/10/1982  . Smokeless tobacco: Former Systems developer  . Alcohol use 8.4 oz/week    10 Glasses of wine, 4 Cans of beer per week     Comment: 1-2 drinks/day, prior to 2005 more  . Drug use: No  . Sexual activity: No   Other Topics Concern  . Not on file   Social History Narrative   Lives with wife [name]). Dog passed away 12/18/12.   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 HCPOA is wife then son and then oldest daughter etc. Advanced directives in chart December 18, 2012). Does not want artificial nutrition/hydration or prolonged life support      Does have stairs in home. BS Degree    REVIEW OF SYSTEMS: Constitutional: No fevers, chills, or sweats, no generalized fatigue, change in appetite Eyes: No visual changes, double vision, eye pain Ear, nose and throat: No hearing loss, ear pain, nasal congestion, sore throat Cardiovascular: No chest pain,  palpitations Respiratory:  No shortness of breath at rest or with exertion, wheezes GastrointestinaI: No nausea, vomiting, diarrhea, abdominal pain, fecal incontinence Genitourinary:  No dysuria, urinary retention or frequency Musculoskeletal:  No neck pain, back pain Integumentary: No rash, pruritus, skin lesions Neurological: as above Psychiatric: No depression, insomnia, anxiety Endocrine: No palpitations, fatigue, diaphoresis, mood swings, change in appetite, change in weight, increased thirst Hematologic/Lymphatic:  No purpura, petechiae. Allergic/Immunologic: no itchy/runny eyes, nasal congestion, recent allergic reactions, rashes  PHYSICAL EXAM: Vitals:   02/06/16 0924 02/06/16 0929  BP: 126/64 (!) 146/86  Pulse: 74    General: No acute distress.  Patient appears well-groomed.  normal body habitus. Head:  Normocephalic/atraumatic Eyes:  Fundi examined but not visualized Neck: supple, no paraspinal tenderness, full range of motion Heart:  Regular rate and rhythm Lungs:  Clear to auscultation bilaterally Back: No paraspinal tenderness Neurological Exam: alert and oriented to person, place, and time. Attention span and concentration intact, recent and remote memory intact, fund of knowledge intact.  Speech fluent and not dysarthric, language intact.  CN II-XII intact. Bulk and tone normal, muscle strength 5/5 throughout.  Sensation to light touch  intact.  Deep tendon reflexes 2+ throughout.  Finger to nose testing intact.  Gait normal  IMPRESSION: 1.  Two episodes of altered awareness.  First episode with loss of awareness and second episode with dizziness.  Most likely due to acute attack of cerebral hypoperfusion from uncontrolled orthostatic hypotension.  Cerebral hypoperfusion may cause brief memory loss and vision loss.  However, given that he had a bowel movement, I would like to evaluate for other potential causes as well (intracranial abnormality, seizure) 2.  Memory and  some mild cognitive deficits, secondary to recurrent episodes of cerebral hypoperfusion  PLAN: 1.  We will check MRI of brain and EEG. 2.  We will schedule for repeat neurocognitive testing to be performed prior to follow up in January.  If workup (MRI and EEG) are not suggestive of an alternative cause, I will maintain that the events are secondary to cerebral hypoperfusion.  Until then, we are not sure the cause of the event on 01/19/16.  Therefore,  I informed him that  law states he should not drive for 6 months after unexplained episode of loss of consciousness or awareness.  However, even if we deduce that the events were due to orthostatic hypotension, she may not drive for 6 months since the orthostatic hypotension is not yet controlled.  27 minutes spent face to face with patient, over 50% spent counseling.  Metta Clines, DO  CC:  Ria Bush, MD

## 2016-02-11 ENCOUNTER — Ambulatory Visit (INDEPENDENT_AMBULATORY_CARE_PROVIDER_SITE_OTHER): Payer: Medicare Other | Admitting: Neurology

## 2016-02-11 DIAGNOSIS — F05 Delirium due to known physiological condition: Secondary | ICD-10-CM

## 2016-02-11 DIAGNOSIS — G903 Multi-system degeneration of the autonomic nervous system: Secondary | ICD-10-CM | POA: Diagnosis not present

## 2016-02-16 ENCOUNTER — Other Ambulatory Visit: Payer: Self-pay | Admitting: Family Medicine

## 2016-02-17 NOTE — Progress Notes (Signed)
ELECTROENCEPHALOGRAM REPORT  Date of Study: 02/11/2016  Patient's Name: Adam Clements MRN: [id number] Date of Birth: [birth year]  Clinical History: 81 year old male with history of two episodes of altered awareness.   Medications: VITAMIN D   PLETAL dipyridamole-aspirin  AGGRENOX FLORINEF  PRAVACHOL CYANOCOBALAMIN  Technical Summary: A multichannel digital EEG recording measured by the international 10-20 system with electrodes applied with paste and impedances below 5000 ohms performed in our laboratory with EKG monitoring in an awake and asleep patient.  Hyperventilation was not performed.  Photic stimulation was performed.  The digital EEG was referentially recorded, reformatted, and digitally filtered in a variety of bipolar and referential montages for optimal display.    Description: The patient is awake and asleep during the recording.  During maximal wakefulness, there is a symmetric, medium voltage 8 Hz posterior dominant rhythm that attenuates with eye opening.  The record is symmetric.  During drowsiness and sleep, there is an increase in theta slowing of the background.  Vertex waves and symmetric sleep spindles were seen.  Photic stimulation did not elicit any abnormalities.  There were no epileptiform discharges or electrographic seizures seen.    EKG lead was unremarkable.  Impression: This awake and asleep EEG is normal.    Clinical Correlation: A normal EEG does not exclude a clinical diagnosis of epilepsy.  If further clinical questions remain, prolonged EEG may be helpful.  Clinical correlation is advised.   Metta Clines, DO

## 2016-02-18 NOTE — Telephone Encounter (Signed)
Refill sent per [facility] refill protocol/SLS  

## 2016-02-19 ENCOUNTER — Ambulatory Visit (INDEPENDENT_AMBULATORY_CARE_PROVIDER_SITE_OTHER): Payer: Medicare Other | Admitting: Family Medicine

## 2016-02-19 ENCOUNTER — Encounter: Payer: Self-pay | Admitting: Family Medicine

## 2016-02-19 VITALS — BP 132/68 | HR 72 | Temp 97.5°F | Resp 16 | Ht 67.0 in | Wt 150.0 lb

## 2016-02-19 DIAGNOSIS — Z7189 Other specified counseling: Secondary | ICD-10-CM | POA: Diagnosis not present

## 2016-02-19 DIAGNOSIS — I951 Orthostatic hypotension: Secondary | ICD-10-CM | POA: Diagnosis not present

## 2016-02-19 DIAGNOSIS — F4489 Other dissociative and conversion disorders: Secondary | ICD-10-CM | POA: Diagnosis not present

## 2016-02-19 NOTE — Progress Notes (Addendum)
BP 132/68 (BP Location: Right Arm, Cuff Size: Normal)   Pulse 72   Temp 97.5 F (36.4 C) (Oral)   Resp 16   Ht 5\' 7"  (1.702 m)   Wt 150 lb (68 kg)   SpO2 98%   BMI 23.49 kg/m    CC: f/u visit Subjective:    Patient ID: Adam Clements, male    DOB: Aug 16, 1934, 81 y.o.   MRN: [id number]  HPI: Adam Clements is a 81 y.o. male presenting on 02/19/2016 for Follow-up (low blood pressure)   See prior note for details. Saw neurology - s/p EEG. Pending MRI. Planned f/u 03/2016 with rpt neuropsych testing.  Known orthostasis currently on florinef 0.1mg  daily. Also on pletal, aggrenox.  Notes worse orthostasis first 15 min after he awakens.   Brings rest of advanced directive which will be scanned.  Relevant past medical, surgical, family and social history reviewed and updated as indicated. Interim medical history since our last visit reviewed. Allergies and medications reviewed and updated. Current Outpatient Prescriptions on File Prior to Visit  Medication Sig  . cholecalciferol (VITAMIN D) 1000 units tablet Take 1,000 Units by mouth daily.  . cilostazol (PLETAL) 50 MG tablet take 1 tablet by mouth twice a day  . dipyridamole-aspirin (AGGRENOX) 200-25 MG 12hr capsule take 1 capsule by mouth twice a day  . fludrocortisone (FLORINEF) 0.1 MG tablet Take 0.1 mg by mouth daily.  . pravastatin (PRAVACHOL) 20 MG tablet take 1 tablet by mouth once daily  . vitamin B-12 (CYANOCOBALAMIN) 500 MCG tablet Take 500 mcg by mouth daily.   No current facility-administered medications on file prior to visit.     Review of Systems Per HPI unless specifically indicated in ROS section     Objective:    BP 132/68 (BP Location: Right Arm, Cuff Size: Normal)   Pulse 72   Temp 97.5 F (36.4 C) (Oral)   Resp 16   Ht 5\' 7"  (1.702 m)   Wt 150 lb (68 kg)   SpO2 98%   BMI 23.49 kg/m   Wt Readings from Last 3 Encounters:  02/19/16 150 lb (68 kg)  02/06/16 145 lb (65.8 kg)  02/05/16 149 lb 8 oz  (67.8 kg)    Physical Exam  Constitutional: He appears well-developed and well-nourished. No distress.  Cardiovascular: Normal rate, regular rhythm, normal heart sounds and intact distal pulses.   No murmur heard. Pulmonary/Chest: Effort normal and breath sounds normal. No respiratory distress. He has no wheezes. He has no rales.  Musculoskeletal: He exhibits no edema.  Skin: Skin is warm and dry. No rash noted.  Psychiatric: He has a normal mood and affect.  Nursing note and vitals reviewed.  Results for orders placed or performed in visit on 01/29/16  Vitamin B12  Result Value Ref Range   Vitamin B-12 574 211 - 911 pg/mL  Comprehensive metabolic panel  Result Value Ref Range   Sodium 140 135 - 145 mEq/L   Potassium 3.8 3.5 - 5.1 mEq/L   Chloride 105 96 - 112 mEq/L   CO2 29 19 - 32 mEq/L   Glucose, Bld 105 (H) 70 - 99 mg/dL   BUN 25 (H) 6 - 23 mg/dL   Creatinine, Ser 1.31 0.40 - 1.50 mg/dL   Total Bilirubin 0.5 0.2 - 1.2 mg/dL   Alkaline Phosphatase 49 39 - 117 U/L   AST 16 0 - 37 U/L   ALT 14 0 - 53 U/L   Total Protein  5.9 (L) 6.0 - 8.3 g/dL   Albumin 3.6 3.5 - 5.2 g/dL   Calcium 8.8 8.4 - 10.5 mg/dL   GFR 55.99 (L) >60.00 mL/min  TSH  Result Value Ref Range   TSH 1.59 0.35 - 4.50 uIU/mL  CBC with Differential/Platelet  Result Value Ref Range   WBC 4.4 4.0 - 10.5 K/uL   RBC 3.84 (L) 4.22 - 5.81 Mil/uL   Hemoglobin 12.5 (L) 13.0 - 17.0 g/dL   HCT 36.6 (L) 39.0 - 52.0 %   MCV 95.4 78.0 - 100.0 fl   MCHC 34.1 30.0 - 36.0 g/dL   RDW 12.9 11.5 - 15.5 %   Platelets 193.0 150.0 - 400.0 K/uL   Neutrophils Relative % 55.6 43.0 - 77.0 %   Lymphocytes Relative 24.8 12.0 - 46.0 %   Monocytes Relative 11.2 3.0 - 12.0 %   Eosinophils Relative 7.8 (H) 0.0 - 5.0 %   Basophils Relative 0.6 0.0 - 3.0 %   Neutro Abs 2.5 1.4 - 7.7 K/uL   Lymphs Abs 1.1 0.7 - 4.0 K/uL   Monocytes Absolute 0.5 0.1 - 1.0 K/uL   Eosinophils Absolute 0.3 0.0 - 0.7 K/uL   Basophils Absolute 0.0 0.0 -  0.1 K/uL  POCT Urinalysis Dipstick (Automated)  Result Value Ref Range   Color, UA Yellow    Clarity, UA Clear    Glucose, UA Negative    Bilirubin, UA Negative    Ketones, UA Negative    Spec Grav, UA >=1.030    Blood, UA Trace    pH, UA 5.5    Protein, UA Negative    Urobilinogen, UA 0.2    Nitrite, UA Negative    Leukocytes, UA Negative Negative      Assessment & Plan:   Problem List Items Addressed This Visit    Advanced care planning/counseling discussion    Advanced directives: Done through attorney. Advanced directives in chart (01/2016). If terminal condition, does not want artificial nutrition/hydration or prolonged life support. HCPOA form scanned 02/20/2016 - wife [name] then children [name], [name], [name] are [place].       Confusional state    Attributed to hypotension with resultant cerebral hypoperfusion.       Orthostatic hypotension - Primary    Stable period. Appreciate neurology care. Continue florinef.           Follow up plan: Return in about 6 months (around 08/18/2016) for medicare wellness visit.  Ria Bush, MD

## 2016-02-19 NOTE — Assessment & Plan Note (Signed)
Attributed to hypotension with resultant cerebral hypoperfusion.

## 2016-02-19 NOTE — Progress Notes (Signed)
Pre visit review using our clinic review tool, if applicable. No additional management support is needed unless otherwise documented below in the visit note. 

## 2016-02-19 NOTE — Assessment & Plan Note (Signed)
Advanced directives: Done through attorney. Advanced directives in chart (01/2016). If terminal condition, does not want artificial nutrition/hydration or prolonged life support. HCPOA form scanned 02/20/2016 - wife [name] then children [name], [name], [name] are [place].

## 2016-02-19 NOTE — Assessment & Plan Note (Addendum)
Stable period. Appreciate neurology care. Continue florinef.

## 2016-02-19 NOTE — Patient Instructions (Addendum)
Continue florinef first thing in the morning when you wake up.  I recommend treating blood pressure based on standing readings to avoid lows.  Return in 6 months for medicare wellness visit

## 2016-02-22 ENCOUNTER — Telehealth: Payer: Self-pay

## 2016-02-22 ENCOUNTER — Ambulatory Visit
Admission: RE | Admit: 2016-02-22 | Discharge: 2016-02-22 | Disposition: A | Discharge status: Home / Self Care | Payer: Medicare Other | Source: Ambulatory Visit | Attending: Neurology | Admitting: Neurology

## 2016-02-22 DIAGNOSIS — G903 Multi-system degeneration of the autonomic nervous system: Secondary | ICD-10-CM

## 2016-02-22 DIAGNOSIS — R55 Syncope and collapse: Secondary | ICD-10-CM | POA: Diagnosis not present

## 2016-02-22 DIAGNOSIS — F05 Delirium due to known physiological condition: Secondary | ICD-10-CM

## 2016-02-22 NOTE — Telephone Encounter (Signed)
-----   Message from Pieter Partridge, DO sent at 02/22/2016 10:57 AM EST ----- MRI of brain shows nothing new (no new stroke or tumors), which is reassuring.

## 2016-02-22 NOTE — Telephone Encounter (Signed)
Sent via mychart

## 2016-03-07 DIAGNOSIS — L814 Other melanin hyperpigmentation: Secondary | ICD-10-CM | POA: Diagnosis not present

## 2016-03-07 DIAGNOSIS — B351 Tinea unguium: Secondary | ICD-10-CM | POA: Diagnosis not present

## 2016-03-07 DIAGNOSIS — Z85828 Personal history of other malignant neoplasm of skin: Secondary | ICD-10-CM | POA: Diagnosis not present

## 2016-03-07 DIAGNOSIS — L821 Other seborrheic keratosis: Secondary | ICD-10-CM | POA: Diagnosis not present

## 2016-03-07 DIAGNOSIS — L738 Other specified follicular disorders: Secondary | ICD-10-CM | POA: Diagnosis not present

## 2016-03-07 DIAGNOSIS — L720 Epidermal cyst: Secondary | ICD-10-CM | POA: Diagnosis not present

## 2016-03-11 ENCOUNTER — Encounter: Payer: Self-pay | Admitting: Psychology

## 2016-03-11 ENCOUNTER — Ambulatory Visit (INDEPENDENT_AMBULATORY_CARE_PROVIDER_SITE_OTHER): Payer: Medicare Other | Admitting: Psychology

## 2016-03-11 DIAGNOSIS — G903 Multi-system degeneration of the autonomic nervous system: Secondary | ICD-10-CM

## 2016-03-11 DIAGNOSIS — F05 Delirium due to known physiological condition: Secondary | ICD-10-CM

## 2016-03-11 DIAGNOSIS — R413 Other amnesia: Secondary | ICD-10-CM

## 2016-03-11 NOTE — Progress Notes (Signed)
NEUROPSYCHOLOGICAL INTERVIEW (CPT: [id number])  Name: Adam Clements Date of Birth: 1934-06-14 Date of Interview: 03/11/2016  Reason for Referral:  Adam Clements is a 81 y.o., married, right-handed male who is referred for neuropsychological re-evaluation by Dr. Metta Clines of [facility] due to concerns about memory decline. This patient is unaccompanied for today's appointment.  History of Presenting Problem:  Mr. Adam Clements has a history of neuro-cardiogenic orthostatic hypotension with recurrent episodes of confusion. He has had several syncopal episodes in the past. He has been treated with Florinef. Since the summer of 2016, he has had recurrent episodes of difficulty with speech output which seem to correlate with low blood pressure. He has had a few confusional episodes over the past year. On the morning of 01/19/16, he woke up and found himself in the bathroom leaning over the sink.  He had central vision loss (able to see in the periphery) and noticed that he had a bowel movement on the floor.  He returned to baseline in about 15 minutes.  On 01/28/2016, he had a similar event, however he did not lose awareness.  He got up in the morning and walked to the bathroom.  He was standing at the sink when he felt lightheaded and noted central vision loss.  It lasted for a shorter period of time, only several minutes.  He did not have a bowel movement or urinary incontinence.  His blood pressure machine had not been working well, so he wasn't able to accurately check his blood pressure at the time. Dr. Tomi Likens felt these two episodes were most likely due to acute attack of cerebral hypoperfusion from uncontrolled orthostatic hypotension. However, given that he had a bowel movement during one of the episodes, further testing was done to rule out other potential causes. An EEG completed on 02/11/2016 was reportedly normal. MRI of the brain completed on 02/22/2016 was stable in comparison to 2015 imaging.  Moderate for age cerebral white matter signal changes were noted, likely related to chronic small vessel disease in light of a solitary chronic microhemorrhage in the left temporal lobe.  Adam Clements underwent previous neuropsychological evaluation on 04/10/2015 at [facility]. In Dr. Maryfrances Bunnell report dated 04/30/2015, a deficit in semantic fluency was noted, but all other test results were within normal limits. Dr. Leonides Schanz noted "The patient's neurocardiogenic orthostatic hypotension with associated diminished expressive language and his  Medical history in general are likely the primary contributing factors to his current isolated deficits of semantic fluency (i.e., due to repeated hypoperfusion)."  Since his last evaluation almost a year ago, the patient denies any significant change in cognitive functioning overall. He also denies any change in his ability to manage his affairs and daily tasks. He continues to lead a busy and active lifestyle, caring for two horses on his property and building houses with [employer]. He reports a possible change in mood with increased stress related to aging concerns (e.g., his and his wife's medical conditions, considering downsizing and selling their home). He denies depressed mood or anhedonia. He denied any history of hallucinations. He denied suicidal ideation or intention. He reported that over the past year he has not felt as sure of himself with the projects he does for [employer]. He noted he has to be much more conscious and "in the moment" with the tasks he is doing so that "I don't get anyone else in trouble or get myself in trouble."  Upon direct questioning, the patient reported the  following with regard to current cognitive functioning:   Forgetting recent conversations/events: No except I don't retain what I read as well as I used to Repeating statements/questions: No Misplacing/losing items: No Forgetting appointments or other  obligations: No, I keep good records but I do have to refer to the calendar more often Forgetting to take medications: No  Difficulty concentrating: No Starting but not finishing tasks: No Distracted easily: No  Word-finding difficulty: No, it may happen, but it is nothing out of the ordinary Spelling difficulty: I refer to the dictionary more often than I used to Comprehension difficulty: Not in conversation, but I do have difficulty following things in the newspaper (have to re-read a couple of times)  Getting lost when driving: No Uncertain about directions when driving or passenger: Occasional, not very often   Adam Clements continues to drive, manage his medications, manage the family finances and manage his appointments independently.  He denied any change in his sleep. He continues to exhibit symptoms of REM sleep behavior disorder with acting out dreams and talking/yelling in his sleep. He gets 8-9 hrs a night, and he feels rested upon awakening.   The patient reportedly has a history of alcohol abuse (20+ years of heavy nightly alcohol use, reduced alcohol use in approximately 2--4 at which point he drank 2-3 drinks nightly). He reported that he currently consumes one glass of wine a day.   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 Marital history: Married with four children Alcohol/Tobacco/Substances: Alcohol use described above. Currently one glass of wine daily. Former smoker (2 ppd x 30 years; quit in 1985). No illicit substance abuse.   Medical History: Past Medical History:  Diagnosis Date  . Allergic rhinitis   . Arthritis    Gout  . BPH (benign prostatic hypertrophy)   . Bradycardia   . CAD (coronary artery disease)    nonobstructive  . Chronic anemia 2013   h/o IDA,  did not tolerate oral iron, latest normal iron studies, B12, folate  . Diverticulosis of colon    conoscopy 12/1993 (Dr. Amedeo Plenty) S/G divertics//colonoscopy L&R divertics (Dr. Amedeo Plenty) 11/08/2004  . Dizziness   . Gout   . HTN (hypertension)   . Memory loss   . Mitral regurgitation    Pulmonic regurgitation, with marked LA enlargement  . Nonischemic cardiomyopathy (HCC)    EF 40%  . Orthostatic hypotension   . PVC (premature ventricular contraction)   . PVD (peripheral vascular disease) ([place])    followed by Dr Trula Slade, treating medically (pletal and aspirin)  . RBBB longstanding  . Squamous cell carcinoma in situ of skin 2015   L ear  . Syncope      Current Medications:  Outpatient Encounter Prescriptions as of 03/11/2016  Medication Sig  . cholecalciferol (VITAMIN D) 1000 units tablet Take 1,000 Units by mouth daily.  . cilostazol (PLETAL) 50 MG tablet take 1 tablet by mouth twice a day  . dipyridamole-aspirin (AGGRENOX) 200-25 MG 12hr capsule take 1 capsule by mouth twice a day  . fludrocortisone (FLORINEF) 0.1 MG tablet Take 0.1 mg by mouth daily.  . pravastatin (PRAVACHOL) 20 MG tablet take 1 tablet by mouth once daily  . vitamin B-12 (CYANOCOBALAMIN) 500 MCG tablet Take 500 mcg by mouth daily.   No facility-administered encounter medications on file as of 03/11/2016.      Behavioral  Observations:   Appearance: Neatly and appropriately dressed and groomed Gait: Ambulated independently, mild unsteadiness observed Speech: Fluent; normal rate, rhythm and volume. No significant word finding difficulty. Thought process: Linear, goal directed Affect: Mildly blunted, euthymic Interpersonal: Pleasant, appropriate   TESTING: There is medical necessity to proceed with neuropsychological assessment as the results will be used to aid in differential diagnosis and clinical decision-making and to inform specific treatment recommendations. The patient completed neuropsychological  testing one year ago and demonstrated mild deficits at that time. Since then, he has had more episodes of confusion and therefore repeat neuropsychological testing is indicated.   PLAN: The patient will return for a full battery of neuropsychological testing with a psychometrician under my supervision. Education regarding testing procedures was provided. Subsequently, the patient will see this provider for a follow-up session at which time his test performances and my impressions and treatment recommendations will be reviewed in detail.   Full neuropsychological evaluation report to follow.

## 2016-03-12 ENCOUNTER — Ambulatory Visit (INDEPENDENT_AMBULATORY_CARE_PROVIDER_SITE_OTHER): Payer: Medicare Other | Admitting: Psychology

## 2016-03-12 DIAGNOSIS — R413 Other amnesia: Secondary | ICD-10-CM | POA: Diagnosis not present

## 2016-03-12 NOTE — Progress Notes (Signed)
   Neuropsychology Note  Adam Clements returned today for 2 hours of neuropsychological testing with technician, Milana Kidney, BS, under the supervision of Dr. Macarthur Critchley. The patient did not appear overtly distressed by the testing session, per behavioral observation or via self-report to the technician. Rest breaks were offered. Adam Clements will return within 2 weeks for a feedback session with Dr. Si Raider at which time his test performances, clinical impressions and treatment recommendations will be reviewed in detail. The patient understands he can contact our office should he require our assistance before this time.  Full report to follow.

## 2016-03-25 ENCOUNTER — Telehealth: Payer: Self-pay | Admitting: Family Medicine

## 2016-03-25 NOTE — Telephone Encounter (Signed)
Patient has appointment scheduled tomorrow with Tor Netters, NP at 1130 to review acute concerns.  Will forward note to PCP and NP as an FYI.

## 2016-03-25 NOTE — Telephone Encounter (Signed)
Patient Name: Adam Clements  DOB: September 19, 1934    Initial Comment Caller states has history of passing out and he has almost passed out today.   Nurse Assessment  Nurse: Venetia Maxon, RN, Manuela Schwartz Date/Time (Eastern Time): 03/25/2016 3:12:29 PM  Confirm and document reason for call. If symptomatic, describe symptoms. ---Caller states has history of passing out and he has almost passed out today. last time he passed out was last month. He was seen by Dr related to B/P and his medications. They have modified the meds a few times. He has had this about 100 times. normally it is the first 5min. today it went on for 45 min. He was trying to shave toileting. he has to sit down 10 times. He is drinking liquids. Last urine was 8am . no fever no V/D  Does the patient have any new or worsening symptoms? ---Yes  Will a triage be completed? ---Yes  Related visit to physician within the last 2 weeks? ---No  Does the PT have any chronic conditions? (i.e. diabetes, asthma, etc.) ---Yes  List chronic conditions. ---HTN Fludrocortisone ( florinef) pravastatin Aggrenox  Is this a behavioral health or substance abuse call? ---No     Guidelines    Guideline Title Affirmed Question Affirmed Notes  Diarrhea [1] Drinking very little AND [2] dehydration suspected (e.g., no urine > 12 hours, very dry mouth, very lightheaded)    Final Disposition User   Go to ED Now (or PCP triage) Venetia Maxon, RN, Manuela Schwartz    Referrals  GO TO FACILITY UNDECIDED  [facility] UNDECIDED   Disagree/Comply: Comply

## 2016-03-26 ENCOUNTER — Ambulatory Visit (INDEPENDENT_AMBULATORY_CARE_PROVIDER_SITE_OTHER): Payer: Medicare Other | Admitting: Family Medicine

## 2016-03-26 ENCOUNTER — Encounter: Payer: Self-pay | Admitting: Family Medicine

## 2016-03-26 VITALS — BP 160/90 | HR 70 | Wt 150.0 lb

## 2016-03-26 DIAGNOSIS — R42 Dizziness and giddiness: Secondary | ICD-10-CM | POA: Diagnosis not present

## 2016-03-26 DIAGNOSIS — I951 Orthostatic hypotension: Secondary | ICD-10-CM | POA: Diagnosis not present

## 2016-03-26 MED ORDER — BLOOD PRESSURE MONITOR AUTOMAT DEVI: 1.0000 [IU] | 0 refills | Status: DC | PRN

## 2016-03-26 NOTE — Patient Instructions (Signed)
I have provided a new prescription for a new blood pressure cuff Please continue to take your blood pressures during the episodes and keep a log. Bring to your next appointment with you.  Let us know if diarrhea worsens, continue to drink enough fluids to make your urine light yellwo

## 2016-03-26 NOTE — Telephone Encounter (Signed)
Longstanding orthostatic hypotension, we have een managing on fludrocortisone 0.1mg  daily, keeping BP mildly elevated.  He has been followed by neurology Dr Tomi Likens, latest completing neuropsych testing, results pending. Caution with florinef - prior dose of 0.3mg  contributed to acute CHF exacerbation.

## 2016-03-26 NOTE — Progress Notes (Signed)
Subjective:    Patient ID: Adam Clements, male    DOB: [birth year], 82 y.o.   MRN: [id number]  HPI This is a 82 yo male who presents today with continued feelings of lightheadedness. This has been going on for at least 2 years.  The first 20-30 minutes in the morning yesterday, he had to sit multiple times due to vague feelings of lightheadedness. Today was better. He made the appointment after yesterday was so difficult.  Yesterday was unable to complete his barn chores at one time. He feels cold, clammy and tense, not associated with lightheadedness. Hands feel cold. Back feels achy. No lightheadedness typically while exercising or with [employer] work. Last fall September or November.   Recently, has had diarrhea x 1 episode first thing in the morning x several weeks. Bowel movements are loose with a lot of water, follows a normal bowel movement of the day. He gets up around 6:15 and takes medications right away and has a glass of milk and something small to eat. He then goes to the barn and does his chores then comes back and has a regular breakfast. No change in his medication. No abdominal pain, no blood on bowel movement.   He is very active, works several days a week at [employer] and works out at gym 3 days a week for 1 hour. He does not want to stop any of his activities and his wife becomes upset if he lies down during an episode. She calls ambulance which upsets him. Neurology has told him not to drive and Dr. Danise Mina had told him to stop his [employer] work. Patient refuses.   No headache, no chest pain, no edema. Sleeps well, feels rested in the morning. Sleeps about 9 hours a night.  Feels more frustrated than normal that he is having continued symptoms with no clear cause or treatment.   Past Medical History:  Diagnosis Date  . Allergic rhinitis   . Arthritis    Gout  . BPH (benign prostatic hypertrophy)   . Bradycardia   . CAD (coronary artery disease)     nonobstructive  . Chronic anemia 2013   h/o IDA, did not tolerate oral iron, latest normal iron studies, B12, folate  . Diverticulosis of colon    conoscopy 12/1993 (Dr. Amedeo Plenty) S/G divertics//colonoscopy L&R divertics (Dr. Amedeo Plenty) 11/08/2004  . Dizziness   . Gout   . HTN (hypertension)   . Memory loss   . Mitral regurgitation    Pulmonic regurgitation, with marked LA enlargement  . Nonischemic cardiomyopathy (HCC)    EF 40%  . Orthostatic hypotension   . PVC (premature ventricular contraction)   . PVD (peripheral vascular disease) ([place])    followed by Dr Trula Slade, treating medically (pletal and aspirin)  . RBBB longstanding  . Squamous cell carcinoma in situ of skin 2015   L ear  . Syncope    Past Surgical History:  Procedure Laterality Date  . 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 APPENDECTOMY    . dexa  2004   "shrunk 2 inches", WNL   Family History  Problem Relation Age of Onset  . Heart disease Mother 46    Natural causes with CHF  . Hypertension Mother   . Hypertension Father   . Stroke Father     cerebral hemorrhage  . Hyperlipidemia Brother   . Heart disease Brother     before age 103  . Hypertension Brother   .  Heart attack Brother   . Hypertension     Social History  Substance Use Topics  . Smoking status: Former Smoker    Packs/day: 2.00    Types: Cigarettes    Quit date: 11/10/1982  . Smokeless tobacco: Former Systems developer  . Alcohol use 4.2 oz/week    7 Glasses of wine per week     Comment: 1 glass of wine daily. History of heavy drinking prior to 2005.      Review of Systems Per HPI    Objective:   Physical Exam Physical Exam  Constitutional: Oriented to person, place, and time. He appears well-developed and well-nourished.  HENT:  Head: Normocephalic and atraumatic.  Eyes: Conjunctivae are normal.  Neck: Normal range of motion. Neck supple.  Cardiovascular: Normal rate, regular rhythm and normal heart sounds.   Pulmonary/Chest: Effort  normal and breath sounds normal.  Musculoskeletal: Normal range of motion.  Neurological: Alert and oriented to person, place, and time.  Skin: Skin is warm and dry.  Psychiatric: Normal mood and affect. Behavior is normal. Judgment and thought content normal.  Vitals reviewed.   BP (!) 160/90   Pulse 70   Wt 150 lb (68 kg)   SpO2 98%   BMI 23.49 kg/m  Wt Readings from Last 3 Encounters:  03/26/16 150 lb (68 kg)  02/19/16 150 lb (68 kg)  02/06/16 145 lb (65.8 kg)   BP Readings from Last 3 Encounters:  03/26/16 (!) 160/90  02/19/16 132/68  02/06/16 (!) 146/86        Assessment & Plan:  Discussed with Dr. Danise Mina 1. Orthostatic hypotension - does not sound as though home blood pressure monitor is working reliably, would be helpful for him to take his blood pressure readings during these episodes and keep a log - Blood Pressure Monitoring (BLOOD PRESSURE MONITOR AUTOMAT) DEVI; 1 Units by Does not apply route as needed.  Dispense: 1 Device; Refill: 0 - he is currently on fludrocortisone 0.1 mg, he had acute CHF when dose was increased so increasing this is not an option  2. Lightheadedness - he has several appointments scheduled in the next week with neurology - encouraged him to avoid activities that trigger symptoms, hydrate well, rest frequently   Clarene Reamer, FNP-BC  [facility] [place]  03/29/2016 9:23 AM

## 2016-03-28 ENCOUNTER — Telehealth: Payer: Self-pay | Admitting: Internal Medicine

## 2016-03-28 ENCOUNTER — Telehealth: Payer: Self-pay

## 2016-03-28 NOTE — Telephone Encounter (Signed)
Discussed with Dr Rayann Heman and he would like for the patient to follow up with Tommye Standard, PA next week

## 2016-03-28 NOTE — Telephone Encounter (Signed)
New Message  Pt c/o Syncope: STAT if syncope occurred within 30 minutes and pt complains of lightheadedness High Priority if episode of passing out, completely, today or in last 24 hours   1. Did you pass out today? Yes   2. When is the last time you passed out? 7:30am  3. Has this occurred multiple times? Yes before today  4. Did you have any symptoms prior to passing out? Blurred vision over the past couple of days

## 2016-03-28 NOTE — Telephone Encounter (Signed)
Received incoming call. Pt staed he has been having syncopal episodes lasting for 1/2-1 hour for 3-4 days. Pt has hx of syncope, with symptoms lasting only 5 mins. Pt stated he has been dealing with these symptoms for 2 years. Pt stated symptoms usually occur in the morning. 03/26/16 - Ortho BP  Lying  /  Sitting /  Standing                                  176/107 186/84   133/72                        HR                         56          63 03/27/16 - Ortho BP  Lying  /  Sitting /  Standing                               113/64   128/66    69/36                  HR          64           77           85 03/28/16 - Ortho BP  Lying  /  Sitting /  Standing                               145/86   161/82    166/136                 HR          73             76        92 Pt denied CP or SOB. Pt stated he blackout for a few seconds yesterday and fell. Pt denied hitting his head.  nformed I will forward to Dr. Rayann Heman to advise.

## 2016-03-28 NOTE — Telephone Encounter (Signed)
Pt called c/o passing out and dealing with this for 2 years "he said this is really frustrating" he's requesting a referral for this. He was here on 03/27/15 with Debbie.

## 2016-04-01 NOTE — Progress Notes (Signed)
Cardiology Office Note Date:  04/01/2016  Patient ID:  Adam Clements, Adam Clements 17-Apr-1934, MRN [id number] PCP:  Ria Bush, MD  Cardiologist/Electrophysiologist: Dr. Rayann Heman   Chief Complaint:  f/u on med changes  History of Present Illness: Adam Clements is a 82 y.o. male with history of orthostatic hypotension on florinef, PVD followed by Dr. Trula Slade, HLD and expressive aphasia that he has seen neurology for.  He was seen for Dr. Rayann Heman, by myself May 30th  doing "OK", in regards to his syncope, he has had none further, has daily symptoms of lightheadedness that passes quickly this is at his baseline for years and has noted when checking his BP at home, since starting the new medicine his change in BP when standing is much better with less change/drop.  He had mentioned though that his exertional capacity is less in the last month or so.  He works very hard physically, Copywriter, advertising for [employer], caring for his horses, throwing hay and so on, going to the gym 3x week and feels like he needs to stop and "gather himself" this is more a feeling of physical weakness, no CP, not SOB, but weak, and not like his lightheaded symptoms.  This seemed to be new symptom for him different then his orthostatic changes and was referred for stress testing.  He was seen by myself in f/u 09/09/25 to f/u on this.  08/27/15 stress showed dilated LV EF 30-44%, diffuse hypokinesis and no ischemia.  The patient after further discussion given his known PVD the possibility of CAD, he had no ischemia on his stress test, though concerns of anginal equivalent were discussed. The patient did not want to pursue cardiac cath, he felt like his low BP is the etiology.  with up-titration of his florinef he has had issue with retention and this has been titrated as needed, including need for occasional diuretic.  Most recently seen by Dr. Rayann Heman, 12/10/15 doing pretty well, his Florinef decreased to 0.1mg  daily.  He reports  that he can go a month or so doing pretty well, then gets a few days or week that he has symptoms flare up again, mornings seem to be the worst for him, the first 1/2-hour after he gets up.  Some mornings will have to sit several times to avoid fainting, sometimes even will need to lay down to get it to pass, once in December, he was at the bathroom sink, shaving/washing up, felt it starting but did not sit quickly enough and did faint briefly, denies trauma or injury.  No palpitations or CP.  He has not had SOB.  Past Medical History:  Diagnosis Date  . Allergic rhinitis   . Arthritis    Gout  . BPH (benign prostatic hypertrophy)   . Bradycardia   . CAD (coronary artery disease)    nonobstructive  . Chronic anemia 2013   h/o IDA, did not tolerate oral iron, latest normal iron studies, B12, folate  . Diverticulosis of colon    conoscopy 12/1993 (Dr. Amedeo Plenty) S/G divertics//colonoscopy L&R divertics (Dr. Amedeo Plenty) 11/08/2004  . Dizziness   . Gout   . HTN (hypertension)   . Memory loss   . Mitral regurgitation    Pulmonic regurgitation, with marked LA enlargement  . Nonischemic cardiomyopathy (HCC)    EF 40%  . Orthostatic hypotension   . PVC (premature ventricular contraction)   . PVD (peripheral vascular disease) ([place])    followed by Dr Trula Slade, treating medically (pletal and  aspirin)  . RBBB longstanding  . Squamous cell carcinoma in situ of skin 2015   L ear  . Syncope     Past Surgical History:  Procedure Laterality Date  . 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 APPENDECTOMY    . dexa  2004   "shrunk 2 inches", WNL    Current Outpatient Prescriptions  Medication Sig Dispense Refill  . Blood Pressure Monitoring (BLOOD PRESSURE MONITOR AUTOMAT) DEVI 1 Units by Does not apply route as needed. 1 Device 0  . cholecalciferol (VITAMIN D) 1000 units tablet Take 1,000 Units by mouth daily.    . cilostazol (PLETAL) 50 MG tablet take 1 tablet by mouth twice a day 60 tablet 5  .  dipyridamole-aspirin (AGGRENOX) 200-25 MG 12hr capsule take 1 capsule by mouth twice a day 60 capsule 11  . fludrocortisone (FLORINEF) 0.1 MG tablet Take 0.1 mg by mouth daily.    . pravastatin (PRAVACHOL) 20 MG tablet take 1 tablet by mouth once daily 30 tablet 6  . vitamin B-12 (CYANOCOBALAMIN) 500 MCG tablet Take 500 mcg by mouth daily.     No current facility-administered medications for this visit.     Allergies:   Atorvastatin   Social History:  The patient  reports that he quit smoking about 33 years ago. His smoking use included Cigarettes. He smoked 2.00 packs per day. He has quit using smokeless tobacco. He reports that he drinks about 4.2 oz of alcohol per week . He reports that he does not use drugs.   Family History:  The patient's family history includes Heart attack in his brother; Heart disease in his brother; Heart disease (age of onset: 65) in his mother; Hyperlipidemia in his brother; Hypertension in his brother, father, and mother; Stroke in his father.  ROS:  Please see the history of present illness.   All other systems are reviewed and otherwise negative.   PHYSICAL EXAM:  VS:  There were no vitals taken for this visit. BMI: There is no height or weight on file to calculate BMI. Well nourished, well developed, in no acute distress  HEENT: normocephalic, atraumatic  Neck: no JVD, carotid bruits or masses Cardiac:  RRR, no significant murmurs, no rubs, or gallops Lungs:  clear to auscultation bilaterally, no wheezing, rhonchi or rales  Abd: soft, nontender MS: no deformity or atrophy Ext: wearing support stockings, trace edema  Skin: warm and dry, no rash Neuro:  No gross deficits appreciated Psych: euthymic mood, full affect   EKG:  10/25/15, is SR, RBBB, APCs, VPC, otherwise appears unchanged from previous.  08/27/15: stress test Study Highlights     Nuclear stress EF: 36%.  This is an intermediate risk study.  The left ventricular ejection fraction is  moderately decreased (30-44%).  LV is dilated with moderate diffuse hypokinesis. There is no evidence of scar or ischemia.      08/27/15: 48hour holter Study Highlights    Sinus rhythm with bundle branch block Frequent premature atrial contractions and rare premature ventricular contractions Rare nonsustained atrial tachycardia Rare nonsustained ventricular tachycardia No atrial fibrillation     07/10/15: Echocardiogram Study Conclusions - Left ventricle: The cavity size was normal. Wall thickness was  increased in a pattern of moderate LVH. Systolic function was  mildly reduced. The estimated ejection fraction was in the range  of 45% to 50%. Doppler parameters are consistent with abnormal  left ventricular relaxation (grade 1 diastolic dysfunction). The  E/e&' ratio is between 8-15, suggesting  indeterminate LV filling  pressure. Ejection fraction (MOD, 2-plane): 47%. - Aortic valve: Trileaflet. Sclerosis without stenosis. There was  trivial regurgitation. - Mitral valve: Mildly thickened leaflets . There was mild  regurgitation. - Left atrium: Severely dilated at 58 ml/m2. - Inferior vena cava: The vessel was normal in size. The  respirophasic diameter changes were in the normal range (>= 50%),  consistent with normal central venous pressure. Impressions: - Compared to the prior study in 2014, the EF has improved to  45-50%.  07/30/10: EF 45% 11/02/08 EF 40% 07/19/08: EF 40%  Recent Labs: 01/30/2016: ALT 14; BUN 25; Creatinine, Ser 1.31; Hemoglobin 12.5; Platelets 193.0; Potassium 3.8; Sodium 140; TSH 1.59  08/21/2015: Cholesterol 144; HDL 63.20; LDL Cholesterol 72; Total CHOL/HDL Ratio 2; Triglycerides 43.0; VLDL 8.6   CrCl cannot be calculated (Patient's most recent lab result is older than the maximum 21 days allowed.).   Wt Readings from Last 3 Encounters:  03/26/16 150 lb (68 kg)  02/19/16 150 lb (68 kg)  02/06/16 145 lb (65.8 kg)     Other studies  reviewed: Additional studies/records reviewed today include: summarized above  ASSESSMENT AND PLAN:  1. Hypotension, postural hypotension      Has dysautonomia and chronic hypotension      Discussed given mornings tend to be where he has most of his symptoms, elevating the head of his bed a couple inches      He has been instructed to not climb ladders or roof work etc that would put him at fall/injury risk      He wears support stockings      re-counseled on standing slowly, avoiding long periods of standing and to sit if symptoms, to keep adequately hydrated      We discussed increasing the florinef to BID for a couple days when he is having those days that he is getting symptoms, is instructed to monitor closely for fluid accumulation, swelling/SOB should he do this. If he has any further fainting to notify us.   2. Nonischemic CM     NYHA Class I/II with negative exam today for evidence of any fluid OL     Symptomatic postural hypotension limits our ability to treat     F/u echo with improved EF     Has had exacerbations with increased florinef dose historically     In review of Dr. Jackalyn Lombard note, given advanced age, would not favor ICD in this patient for primary prevention          3. PAD     Follows with Dr Trula Slade     On Aggrenox, Pletal, statin  4. Intermittent Expressive aphasia, confusion     Followed by neurology     None since last visit     Felt secondary to recurrent hypoperfusion secondary to episodes of hypotension   Disposition:  No changes, he will call if he feels like he needs to increase his Florinef for guidance during his "bad days", otherwise will see him back in 4 months sooner if needed.  Current medicines are reviewed at length with the patient today.  The patient did not have any concerns regarding medicines.  Haywood Lasso, PA-C 04/01/2016 5:25 PM     [address] [place] [address] [phone]  (office)  [phone] (fax)

## 2016-04-02 ENCOUNTER — Ambulatory Visit (INDEPENDENT_AMBULATORY_CARE_PROVIDER_SITE_OTHER): Payer: Medicare Other | Admitting: Physician Assistant

## 2016-04-02 VITALS — BP 110/58 | HR 73 | Ht 67.0 in | Wt 156.0 lb

## 2016-04-02 DIAGNOSIS — I951 Orthostatic hypotension: Secondary | ICD-10-CM

## 2016-04-02 DIAGNOSIS — I428 Other cardiomyopathies: Secondary | ICD-10-CM | POA: Diagnosis not present

## 2016-04-02 NOTE — Patient Instructions (Signed)
Medication Instructions:   Your physician recommends that you continue on your current medications as directed. Please refer to the Current Medication list given to you today.    If you need a refill on your cardiac medications before your next appointment, please call your pharmacy.  Labwork: NONE ORDERED  TODAY    Testing/Procedures: NONE ORDERED  TODAY    Follow-Up:  In 4  MONTHS WITH ALLRED   Any Other Special Instructions Will Be Listed Below (If Applicable).

## 2016-04-07 ENCOUNTER — Encounter: Payer: Self-pay | Admitting: Neurology

## 2016-04-07 ENCOUNTER — Ambulatory Visit (INDEPENDENT_AMBULATORY_CARE_PROVIDER_SITE_OTHER): Payer: Medicare Other | Admitting: Neurology

## 2016-04-07 VITALS — BP 132/72 | HR 66 | Ht 67.0 in | Wt 154.1 lb

## 2016-04-07 DIAGNOSIS — G9389 Other specified disorders of brain: Secondary | ICD-10-CM

## 2016-04-07 DIAGNOSIS — G3184 Mild cognitive impairment, so stated: Secondary | ICD-10-CM

## 2016-04-07 DIAGNOSIS — G903 Multi-system degeneration of the autonomic nervous system: Secondary | ICD-10-CM | POA: Diagnosis not present

## 2016-04-07 DIAGNOSIS — I679 Cerebrovascular disease, unspecified: Secondary | ICD-10-CM

## 2016-04-07 NOTE — Progress Notes (Deleted)
NEUROPSYCHOLOGICAL EVALUATION   Name:    Adam Clements  Date of Birth:   1935/02/01 Date of Interview:  03/11/2016 Date of Testing:  03/12/2016   Date of Feedback:  04/08/2016       Background Information:  Reason for Referral:  Adam Clements is a 82 y.o., right-handed male referred by Dr. Metta Clines to assess his current level of cognitive functioning and assist in differential diagnosis. The current evaluation consisted of a review of available medical records, an interview with the patient, and the completion of a neuropsychological testing battery. Informed consent was obtained.  History of Presenting Problem:  Adam Clements has a history of neuro-cardiogenic orthostatic hypotension with recurrent episodes of confusion. He has had several syncopal episodes in the past. He has been treated with Florinef. Since the summer of 2016, he has had recurrent episodes of difficulty with speech output which seem to correlate with low blood pressure. He has had a few confusional episodes over the past year. On the morning of 01/19/16, he woke up and found himself in the bathroom leaning over the sink. He had central vision loss (able to see in the periphery) and noticed that he had a bowel movement on the floor. He returned to baseline in about 15 minutes.On 01/28/2016, he had a similar event, however he did not lose awareness. He got up in the morning and walked to the bathroom. He was standing at the sink when he felt lightheaded and noted central vision loss. It lasted for a shorter period of time, only several minutes. He did not have any incontinence associated with this event. His blood pressure machine had not been working well, so he wasn't able to accurately check his blood pressure at the time. Dr. Tomi Likens felt these two episodes were most likely due to acute attack of cerebral hypoperfusion from uncontrolled orthostatic hypotension. However, given that he had a bowel movement during one  of the episodes, further testing was done to rule out other potential causes. An EEG completed on 02/11/2016 was reportedly normal. MRI of the brain completed on 02/22/2016 was stable in comparison to 2015 imaging. Moderate for age cerebral white matter signal changes were noted, likely related to chronic small vessel disease in light of a solitary chronic microhemorrhage in the left temporal lobe.  Adam Clements underwent previous neuropsychological evaluation on 04/10/2015 at [facility]. In Dr. Maryfrances Bunnell report dated 04/30/2015, a deficit in semantic fluency was noted, but all other test results were within normal limits. Dr. Leonides Schanz noted "The patient's neurocardiogenic orthostatic hypotension with associated diminished expressive language and his medical history in general are likely the primary contributing factors to his current isolated deficits of semantic fluency (i.e., due to repeated hypoperfusion)."  Since his last evaluation almost a year ago, the patient denies any significant change in cognitive functioning overall. He also denies any change in his ability to manage his affairs and daily tasks. He continues to lead a busy and active lifestyle, caring for two horses on his property and building houses with [employer]. He reports a possible change in mood with increased stress related to aging concerns (e.g., his and his wife's medical conditions, considering downsizing and selling their home). He denies depressed mood or anhedonia. He denied any history of hallucinations. He denied suicidal ideation or intention. He reported that over the past year he has not felt as sure of himself with the projects he does for [employer]. He noted he has to  be much more conscious and "in the moment" with the tasks he is doing so that "I don't get anyone else in trouble or get myself in trouble."  Upon direct questioning, the patient reported the following with regard to current cognitive  functioning:   Forgetting recent conversations/events: No except I don't retain what I read as well as I used to Repeating statements/questions: No Misplacing/losing items: No Forgetting appointments or other obligations: No, I keep good records but I do have to refer to the calendar more often Forgetting to take medications: No  Difficulty concentrating: No Starting but not finishing tasks: No Distracted easily: No  Word-finding difficulty: No, it may happen, but it is nothing out of the ordinary Spelling difficulty: I refer to the dictionary more often than I used to Comprehension difficulty: Not in conversation, but I do have difficulty following things in the newspaper (have to re-read a couple of times)  Getting lost when driving: No Uncertain about directions when driving or passenger: Occasionally, not very often   Adam Clements continues to drive, manage his medications, manage the family finances and manage his appointments independently.  He denied any change in his sleep. He continues to exhibit symptoms of REM sleep behavior disorder with acting out dreams and talking/yelling in his sleep. He gets 8-9 hrs of sleep a night, and he feels rested upon awakening.   The patient reportedly has a history of alcohol abuse (20+ years of heavy nightly alcohol use, reduced alcohol use in approximately 2004 at which point he drank 2-3 drinks nightly). He reported that he currently consumes one glass of wine a day.   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 Marital history: Married with four children Alcohol/Tobacco/Substances: History of alcohol use described above. Currently one glass of wine daily. Former smoker (2 ppd x 30 years; quit in 1985). No illicit substance  abuse.   Medical History:  Past Medical History:  Diagnosis Date  . Allergic rhinitis   . Arthritis    Gout  . BPH (benign prostatic hypertrophy)   . Bradycardia   . CAD (coronary artery disease)    nonobstructive  . Chronic anemia 2013   h/o IDA, did not tolerate oral iron, latest normal iron studies, B12, folate  . Diverticulosis of colon    conoscopy 12/1993 (Dr. Amedeo Plenty) S/G divertics//colonoscopy L&R divertics (Dr. Amedeo Plenty) 11/08/2004  . Dizziness   . Gout   . HTN (hypertension)   . Memory loss   . Mitral regurgitation    Pulmonic regurgitation, with marked LA enlargement  . Nonischemic cardiomyopathy (HCC)    EF 40%  . Orthostatic hypotension   . PVC (premature ventricular contraction)   . PVD (peripheral vascular disease) ([place])    followed by Dr Trula Slade, treating medically (pletal and aspirin)  . RBBB longstanding  . Squamous cell carcinoma in situ of skin 2015   L ear  . Syncope     Current medications:  Outpatient Encounter Prescriptions as of 04/08/2016  Medication Sig  . Blood Pressure Monitoring (BLOOD PRESSURE MONITOR AUTOMAT) DEVI 1 Units by Does not apply route as needed.  . cholecalciferol (VITAMIN D) 1000 units tablet Take 1,000 Units by mouth daily.  . cilostazol (PLETAL) 50 MG tablet take 1 tablet by mouth twice a day  . dipyridamole-aspirin (AGGRENOX) 200-25 MG 12hr capsule take 1 capsule by mouth twice a  day  . fludrocortisone (FLORINEF) 0.1 MG tablet Take 0.1 mg by mouth daily.  . pravastatin (PRAVACHOL) 20 MG tablet take 1 tablet by mouth once daily  . vitamin B-12 (CYANOCOBALAMIN) 500 MCG tablet Take 500 mcg by mouth daily.   No facility-administered encounter medications on file as of 04/08/2016.      Current Examination:  Behavioral Observations:   Appearance: Neatly and appropriately dressed and groomed Gait: Ambulated independently, mild unsteadiness observed Speech: Fluent; normal rate, rhythm and volume. No significant word finding  difficulty. Thought process: Linear, goal directed Affect: Mildly blunted, euthymic Interpersonal: Pleasant, appropriate Orientation: Oriented to all spheres. Accurately named the current President and his predecessor.   Tests Administered: . Test of Premorbid Functioning (TOPF) . Wechsler Adult Intelligence Scale-Fourth Edition (WAIS-IV): Block Design, Similarities, Matrix Reasoning, Coding and Digit Span subtests . [employer] Verbal Learning Test - 2nd Edition (CVLT-2) Short Form . Wechsler Memory Scale - Fourth Edition (WMS-IV) Older Adult Version (ages 79-90): Logical Memory I, II and Recognition subtests . Repeatable Battery for the Assessment of Neuropsychological Status (RBANS) Form A:  Figure Copy and Recall subtests and Semantic Fluency subtest . Neuropsychological Assessment Battery (NAB) Language Module, Form 1: Bill Payment subtest . Boston Naming Test (BNT) . Boston Diagnostic Aphasia Examination: Commands subtest . Controlled Oral Word Association Test (COWAT) . Trail Making Test A and B . Clock drawing test . Geriatric Depression Scale (GDS) 15 Item . Generalized Anxiety Disorder - 7 item screener (GAD-7)  Test Results: Note: Standardized scores are presented only for use by appropriately trained professionals and to allow for any future test-retest comparison. These scores should not be interpreted without consideration of all the information that is contained in the rest of the report. The most recent standardization samples from the test publisher or other sources were used whenever possible to derive standard scores; scores were corrected for age, gender, ethnicity and education when available.   Test Scores:  ***  Description of Test Results:  Premorbid verbal intellectual abilities were estimated to have been within the average range based on a test of word reading; this may be an underestimate given that his performance on other tests of both verbal and nonverbal  "crystalized" intelligence was superior. Psychomotor processing speed was high average. Auditory attention and working memory were high average. Visual-spatial construction was average to high average. Language abilities were variable. Specifically, confrontation naming was high average, while semantic verbal fluency was low average to borderline impaired. Auditory comprehension of multi-step commands was intact. On a simulated bill payment task, requiring multiple aspects of receptive and expressive language, the patient performed within the average range. With regard to verbal memory, encoding and acquisition of non-contextual information (i.e., word list) was borderline impaired across four learning trials. He demonstrated a flat learning curve. After a brief distracter task, free recall was impaired (3/9 items recalled). After a 10 minute delay, free recall was borderline impaired (1/9 items recalled). He did not benefit from semantic cueing, and cued recall was impaired (1/9 items recalled). Meanwhile, performance on a yes/no recognition task was average with no false positive errors. On another verbal memory test, encoding and acquisition of contextual auditory information (i.e., short stories) was average. After a delay, free recall was average. Performance on a yes/no recognition task was above average. With regard to non-verbal memory, delayed free recall of visual information was average. Executive functioning was intact overall. Mental flexibility and set-shifting were average on Trails [place]. Verbal fluency with phonemic search restrictions was  low average. Verbal abstract reasoning was superior. Non-verbal abstract reasoning was superior. Performance on a clock drawing task was intact. On self-report questionnaires, the patient's responses were not indicative of clinically significant depression or anxiety at the present time.    Clinical Impressions: Mild vascular cognitive impairment, with mild decline  since testing in 03/2015. Results of the current evaluation revealed many areas of intact cognitive function, including abstract reasoning (an area of relative strength in the superior range), processing speed and auditory attention (high average), visual spatial skills, confrontation naming, auditory comprehension, mental flexibility and set-shifting, and visual memory. With regard to verbal memory, his ability to learn and remember contextual information (ie, short stories) was entirely normal, but his ability to learn and remember non-contextual information (ie, word list) was impaired. His pattern of performances across memory tests suggests dysexecutive features rather than consolidation dysfunction. This represents a significant decline since his last evaluation almost a year ago. At that time, he was given a more challenging version of the same test (16-item word list instead of 9-item word list), yet his encoding and retrieval abilities fell in the average range. Relative to his last evaluation, the patient also continues to evidence mildly reduced verbal fluency (semantic fluency is consistent with a year ago; phonemic verbal fluency has declined).  Overall, these test results and the patient's current level of functioning are not consistent with dementia. However, a diagnosis of MCI is warranted. His cognitive profile, alongside evidence of chronic small vessel disease on neuroimaging, suggests vascular etiology.  While the patient is experiencing some stress related to aging, he is not endorsing significant depression or anxiety.    Recommendations/Plan: Based on the findings of the present evaluation, the following recommendations are offered:  1. The patient will be educated regarding vascular cognitive impairment. Optimal control of vascular risk factors will be encouraged. He will be positively reinforced for his physical exercise and active lifestyle. 2. From a cognitive perspective, he  appears able to manage all complex ADLs independently, including driving.  3. Strategies to maintain brain health and to enhance cognitive functioning in daily life will be reviewed with the patient. 4. Neuropsychological re-evaluation in 1-2 years is indicated in order to monitor cognitive status, track any progression of symptoms and further assist with treatment planning.   Feedback to Patient: Adam Clements returned for a feedback appointment on 04/08/2016 to review the results of his neuropsychological evaluation with this provider. *** minutes face-to-face time was spent reviewing his test results, my impressions and my recommendations as detailed above.    Total time spent on this patient's case: [id number] unit for interview with psychologist; [phone] units of testing by psychometrician under psychologist's supervision; [phone] units for medical record review, scoring of neuropsychological tests, interpretation of test results, preparation of this report, and review of results to the patient by psychologist.      Thank you for your referral of Adam Clements. Please feel free to contact me if you have any questions or concerns regarding this report.

## 2016-04-07 NOTE — Progress Notes (Signed)
NEUROLOGY FOLLOW UP OFFICE NOTE  JAZIR LICHTENWALNER PA:[id number]  HISTORY OF PRESENT ILLNESS: Adam Clements is a 82 year old right-handed male with neuro-cardiogenic orthostatic hypotension and PAD who follows up for recurrent episodes of confusion.  Neuropsychological testing report, EEG and labs reviewed.   UPDATE: Due to the two episodes of altered awareness last Fall, an EEG was performed on 02/11/16 which was normal.  MRI of the brain was performed on 02/22/16, which was personally reviewed and was stable.  He underwent repeat neuropsychological testing on 03/12/16.  He has an appointment to follow up with Dr. Si Raider tomorrow for results.  He still feels lightheaded in the mornings when he goes to the bathroom.  He is fine the rest of the day.  He usually puts on his compression stockings after he goes to the bathroom.  Blood pressure in the mornings sometimes run in the 60s/40s.  HISTORY: He has neurocardiogenic orthostatic hypotension.Marland Kitchen  He would often pass out after period of exertion or frequent voiding.   He has been treated with Florinef.  He has had recurrent episodes of difficulty with speech output.  They seem to correlate with low blood pressure.  On 01/01/15, he developed confusion.  He was preparing for a colonoscopy and had trouble understanding the report.  He also had trouble with verbal output and was slurring his speech.  The following morning, he had right-sided headache.  CT of head showed no acute intracranial process.  Carotid doppler showed no hemodynamically significant ICA stenosis.  On the morning of 02/05/15, he developed confusion.  He had trouble figuring out how to put in his hearing aids.  He didn't feel well.  He presented to the ED, where CT of head showed chronic left frontal sinusitis but no acute intracranial process.  UA was negative.  Manifestation of his orthostatic hypotension was suspected and it was suggested that his Florinef should be adjusted.  He followed up  with his PCP.  MRI and MRA of head was performed on 02/07/15 showed no acute infarct or intracranial stenosis.  Over the past year, he notes increased fatigue.  He exhibits symptoms of REM sleep behavior disorder.  He will often thrash his arms when dreaming about being attacked.  This is recurrent.  He has cardiomyopathy with an EF of 35-40%.  Holter monitor revealed sinus rhythm with bundle branch block, frequent PACs, rare PVCs, rare nonsustained atrial tachycardia and rare nonsustained ventricular tachycardia.  No atrial fibrillation.  Other than when he passes out, he denies falls or gait instability.   He also reports gradually worsening short-term memory as well.  He reports that he needs to constantly write things down.  When he is distracted for a moment, he will forget what he needed to do.  He denies problems performing everyday tasks, disorientation while driving, or remembering names or faces.  He used to abuse alcohol but currently drinks one glass of wine a day.  He denies family history of dementia.  His mother had epilepsy.  TSH was 1.18.  B12 was in low-end of normal at 243, but methylmalonic acid level was normal at 153.  He was advised to start B12 1000 mcg daily.   EEG from 03/05/15 was normal.  He had neuropsychological testing at [facility] on 04/10/15.  Testing suggested mild unspecified mental disorder as demonstrated by deficits in semantic fluency with weakness in verbal memory encoding and recognition.  Recurrent episodes of hypotension likely the cause of this.  He had  two confusional episodes a few months ago.  On the morning of 01/19/16, he woke up and found himself in the bathroom leaning over the sink.  He had central vision loss (able to see in the periphery) and noticed that he had a bowel movement on the floor.  He returned to baseline in about 15 minutes.  On 01/28/16, he had a similar event, however he did not lose awareness.  He got up in the morning and walked to the  bathroom.  He was standing at the sink when he felt lightheaded and noted central vision loss.  It lasted for a shorter period of time, only several minutes.  He did not have a bowel movement or urinary incontinence.  PAST MEDICAL HISTORY: Past Medical History:  Diagnosis Date  . Allergic rhinitis   . Arthritis    Gout  . BPH (benign prostatic hypertrophy)   . Bradycardia   . CAD (coronary artery disease)    nonobstructive  . Chronic anemia 2013   h/o IDA, did not tolerate oral iron, latest normal iron studies, B12, folate  . Diverticulosis of colon    conoscopy 12/1993 (Dr. Amedeo Plenty) S/G divertics//colonoscopy L&R divertics (Dr. Amedeo Plenty) 11/08/2004  . Dizziness   . Gout   . HTN (hypertension)   . Memory loss   . Mitral regurgitation    Pulmonic regurgitation, with marked LA enlargement  . Nonischemic cardiomyopathy (HCC)    EF 40%  . Orthostatic hypotension   . PVC (premature ventricular contraction)   . PVD (peripheral vascular disease) ([place])    followed by Dr Trula Slade, treating medically (pletal and aspirin)  . RBBB longstanding  . Squamous cell carcinoma in situ of skin 2015   L ear  . Syncope     MEDICATIONS: Current Outpatient Prescriptions on File Prior to Visit  Medication Sig Dispense Refill  . Blood Pressure Monitoring (BLOOD PRESSURE MONITOR AUTOMAT) DEVI 1 Units by Does not apply route as needed. 1 Device 0  . cholecalciferol (VITAMIN D) 1000 units tablet Take 1,000 Units by mouth daily.    . cilostazol (PLETAL) 50 MG tablet take 1 tablet by mouth twice a day 60 tablet 5  . dipyridamole-aspirin (AGGRENOX) 200-25 MG 12hr capsule take 1 capsule by mouth twice a day 60 capsule 11  . fludrocortisone (FLORINEF) 0.1 MG tablet Take 0.1 mg by mouth daily.    . pravastatin (PRAVACHOL) 20 MG tablet take 1 tablet by mouth once daily 30 tablet 6  . vitamin B-12 (CYANOCOBALAMIN) 500 MCG tablet Take 500 mcg by mouth daily.     No current facility-administered medications on file  prior to visit.     ALLERGIES: Allergies  Allergen Reactions  . Atorvastatin Other (See Comments)    Memory trouble, confusion, fatigue    FAMILY HISTORY: Family History  Problem Relation Age of Onset  . Heart disease Mother 55    Natural causes with CHF  . Hypertension Mother   . Hypertension Father   . Stroke Father     cerebral hemorrhage  . Hyperlipidemia Brother   . Heart disease Brother     before age 66  . Hypertension Brother   . Heart attack Brother   . Hypertension      SOCIAL HISTORY: Social History   Social History  . Marital status: Married    Spouse name: N/A  . Number of children: N/A  . Years of education: N/A   Occupational History  . Not on file.   Social History  Main Topics  . Smoking status: Former Smoker    Packs/day: 2.00    Types: Cigarettes    Quit date: 11/10/1982  . Smokeless tobacco: Former Systems developer  . Alcohol use 4.2 oz/week    7 Glasses of wine per week     Comment: 1 glass of wine daily. History of heavy drinking prior to 2005.  . Drug use: No  . Sexual activity: No   Other Topics Concern  . Not on file   Social History Narrative   Lives with wife [name]). Dog passed away 2012/12/12.   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 HCPOA is wife then son and then oldest daughter etc. Advanced directives in chart December 12, 2012). Does not want artificial nutrition/hydration or prolonged life support      Does have stairs in home. BS Degree    REVIEW OF SYSTEMS: Constitutional: No fevers, chills, or sweats, no generalized fatigue, change in appetite Eyes: No visual changes, double vision, eye pain Ear, nose and throat: No hearing loss, ear pain, nasal congestion, sore throat Cardiovascular: No chest  pain, palpitations Respiratory:  No shortness of breath at rest or with exertion, wheezes GastrointestinaI: No nausea, vomiting, diarrhea, abdominal pain, fecal incontinence Genitourinary:  No dysuria, urinary retention or frequency Musculoskeletal:  No neck pain, back pain Integumentary: No rash, pruritus, skin lesions Neurological: as above Psychiatric: No depression, insomnia, anxiety Endocrine: No palpitations, fatigue, diaphoresis, mood swings, change in appetite, change in weight, increased thirst Hematologic/Lymphatic:  No purpura, petechiae. Allergic/Immunologic: no itchy/runny eyes, nasal congestion, recent allergic reactions, rashes  PHYSICAL EXAM: Vitals:   04/07/16 0930  BP: 132/72  Pulse: 66   General: No acute distress.  Patient appears well-groomed.  normal body habitus. Head:  Normocephalic/atraumatic Eyes:  Fundi examined but not visualized Neck: supple, no paraspinal tenderness, full range of motion Heart:  Regular rate and rhythm Lungs:  Clear to auscultation bilaterally Back: No paraspinal tenderness Neurological Exam: alert and oriented to person, place, and time. Attention span and concentration intact, recent and remote memory intact, fund of knowledge intact.  Speech fluent and not dysarthric, language intact.  CN II-XII intact. Bulk and tone normal, muscle strength 5/5 throughout.  Sensation to light touch  intact.  Deep tendon reflexes 2+ throughout.  Finger to nose testing intact.  Gait normal.  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  Will see what recent neuropsychological testing shows and whether any changes in management are warranted. 2.  Discussed putting on the compression stockings first thing in the morning BEFORE getting out of bed to minimize episodes of dizziness. 3.  Follow up in 9 months.  19 minutes spent face  to face with patient, over 50% spent discussing options to minimize episodes of hypotension.  Metta Clines, DO  CC: . Ria Bush, MD

## 2016-04-07 NOTE — Patient Instructions (Addendum)
1.  Try putting on the stockings first thing in the morning BEFORE you get out of bed 2.  Follow up with Dr. Si Raider tomorrow.  I will contact you if we need to make any changes in management. 3.  Follow up in 9 months.

## 2016-04-08 ENCOUNTER — Encounter: Payer: Medicare Other | Admitting: Psychology

## 2016-04-14 ENCOUNTER — Encounter: Payer: Self-pay | Admitting: Psychology

## 2016-04-14 ENCOUNTER — Ambulatory Visit (INDEPENDENT_AMBULATORY_CARE_PROVIDER_SITE_OTHER): Payer: Medicare Other | Admitting: Psychology

## 2016-04-14 DIAGNOSIS — G3184 Mild cognitive impairment, so stated: Secondary | ICD-10-CM | POA: Diagnosis not present

## 2016-04-14 DIAGNOSIS — G9389 Other specified disorders of brain: Secondary | ICD-10-CM

## 2016-04-14 DIAGNOSIS — I679 Cerebrovascular disease, unspecified: Secondary | ICD-10-CM

## 2016-04-14 NOTE — Progress Notes (Signed)
NEUROPSYCHOLOGICAL EVALUATION     Name:                                      Adam Clements         Date of Birth:                           12-08-1934 Date of Interview:                    03/11/2016 Date of Testing:                      03/12/2016                   Date of Feedback:                  04/14/2016                                     Background Information:   Reason for Referral:  Adam Clements is a 82 y.o., right-handed male referred by Dr. Metta Clines to assess his current level of cognitive functioning and assist in differential diagnosis. The current evaluation consisted of a review of available medical records, an interview with the patient, and the completion of a neuropsychological testing battery. Informed consent was obtained.   History of Presenting Problem:  Adam Clements has a history of neuro-cardiogenic orthostatic hypotension with recurrent episodes of confusion. He has had several syncopal episodes in the past. He has been treated with Florinef. Since the summer of 2016, he has had recurrent episodes of difficulty with speech output which seem to correlate with low blood pressure. He has had a few confusional episodes over the past year. On the morning of 01/19/16, he woke up and found himself in the bathroom leaning over the sink.  He had central vision loss (able to see in the periphery) and noticed that he had a bowel movement on the floor.  He returned to baseline in about 15 minutes.  On 01/28/2016, he had a similar event, however he did not lose awareness.  He got up in the morning and walked to the bathroom.  He was standing at the sink when he felt lightheaded and noted central vision loss.  It lasted for a shorter period of time, only several minutes.  He did not have any incontinence associated with this event.  His blood pressure machine had not been working well, so he wasn't able to accurately check his blood pressure at the time. Dr. Tomi Likens felt these two  episodes were most likely due to acute attack of cerebral hypoperfusion from uncontrolled orthostatic hypotension. However, given that he had a bowel movement during one of the episodes, further testing was done to rule out other potential causes. An EEG completed on 02/11/2016 was reportedly normal. MRI of the brain completed on 02/22/2016 was stable in comparison to 2015 imaging. Moderate for age cerebral white matter signal changes were noted, likely related to chronic small vessel disease in light of a solitary chronic microhemorrhage in the left temporal lobe.   Adam Clements underwent previous neuropsychological evaluation on 04/10/2015 at [facility]. In Dr. Maryfrances Bunnell report dated 04/30/2015, a deficit in semantic  fluency was noted, but all other test results were within normal limits. Dr. Leonides Schanz noted "The patient's neurocardiogenic orthostatic hypotension with associated diminished expressive language and his medical history in general are likely the primary contributing factors to his current isolated deficits of semantic fluency (i.e., due to repeated hypoperfusion)."   Since his last evaluation almost a year ago, the patient denies any significant change in cognitive functioning overall. He also denies any change in his ability to manage his affairs and daily tasks. He continues to lead a busy and active lifestyle, caring for two horses on his property and building houses with [employer]. He reports a possible change in mood with increased stress related to aging concerns (e.g., his and his wife's medical conditions, considering downsizing and selling their home). He denies depressed mood or anhedonia. He denied any history of hallucinations. He denied suicidal ideation or intention. He reported that over the past year he has not felt as sure of himself with the projects he does for [employer]. He noted he has to be much more conscious and "in the moment" with the tasks he is doing so  that "I don't get anyone else in trouble or get myself in trouble."   Upon direct questioning, the patient reported the following with regard to current cognitive functioning:    Forgetting recent conversations/events: No except I don't retain what I read as well as I used to Repeating statements/questions: No Misplacing/losing items: No Forgetting appointments or other obligations: No, I keep good records but I do have to refer to the calendar more often Forgetting to take medications: No   Difficulty concentrating: No Starting but not finishing tasks: No Distracted easily: No   Word-finding difficulty: No, it may happen, but it is nothing out of the ordinary Spelling difficulty: I refer to the dictionary more often than I used to Comprehension difficulty: Not in conversation, but I do have difficulty following things in the newspaper (have to re-read a couple of times)   Getting lost when driving: No Uncertain about directions when driving or passenger: Occasionally, not very often     Adam Clements continues to drive, manage his medications, manage the family finances and manage his appointments independently.   He denied any change in his sleep. He continues to exhibit symptoms of REM sleep behavior disorder with acting out dreams and talking/yelling in his sleep. He gets 8-9 hrs of sleep a night, and he feels rested upon awakening.    The patient reportedly has a history of alcohol abuse (20+ years of heavy nightly alcohol use, reduced alcohol use in approximately 2004 at which point he drank 2-3 drinks nightly). He reported that he currently consumes one glass of wine a day.     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 Marital history: Married with four  children Alcohol/Tobacco/Substances: History of alcohol use described above. Currently one glass of wine daily. Former smoker (2 ppd x 30 years; quit in 1985). No illicit substance abuse.   Medical History:  Past Medical History:  Diagnosis Date  . Allergic rhinitis   . Arthritis    Gout  . BPH (benign prostatic hypertrophy)   . Bradycardia   . CAD (coronary artery disease)    nonobstructive  . Chronic anemia 2013   h/o IDA, did not tolerate oral iron, latest normal iron studies, B12, folate  .  Diverticulosis of colon    conoscopy 12/1993 (Dr. Amedeo Plenty) S/G divertics//colonoscopy L&R divertics (Dr. Amedeo Plenty) 11/08/2004  . Dizziness   . Gout   . HTN (hypertension)   . Memory loss   . Mitral regurgitation    Pulmonic regurgitation, with marked LA enlargement  . Nonischemic cardiomyopathy (HCC)    EF 40%  . Orthostatic hypotension   . PVC (premature ventricular contraction)   . PVD (peripheral vascular disease) ([place])    followed by Dr Trula Slade, treating medically (pletal and aspirin)  . RBBB longstanding  . Squamous cell carcinoma in situ of skin 2015   L ear  . Syncope     Current medications:  Outpatient Encounter Prescriptions as of 04/14/2016  Medication Sig  . Blood Pressure Monitoring (BLOOD PRESSURE MONITOR AUTOMAT) DEVI 1 Units by Does not apply route as needed.  . cholecalciferol (VITAMIN D) 1000 units tablet Take 1,000 Units by mouth daily.  . cilostazol (PLETAL) 50 MG tablet take 1 tablet by mouth twice a day  . dipyridamole-aspirin (AGGRENOX) 200-25 MG 12hr capsule take 1 capsule by mouth twice a day  . fludrocortisone (FLORINEF) 0.1 MG tablet Take 0.1 mg by mouth daily.  . pravastatin (PRAVACHOL) 20 MG tablet take 1 tablet by mouth once daily  . vitamin B-12 (CYANOCOBALAMIN) 500 MCG tablet Take 500 mcg by mouth daily.   No facility-administered encounter medications on file as of 04/14/2016.      Current Examination:   Behavioral Observations:   Appearance:  Neatly and appropriately dressed and groomed Gait: Ambulated independently, mild unsteadiness observed Speech: Fluent; normal rate, rhythm and volume. No significant word finding difficulty. Thought process: Linear, goal directed Affect: Mildly blunted, euthymic Interpersonal: Pleasant, appropriate Orientation: Oriented to all spheres. Accurately named the current President and his predecessor.     Tests Administered:  Test of Premorbid Functioning (TOPF)  Wechsler Adult Intelligence Scale-Fourth Edition (WAIS-IV): Music therapist, Similarities, Matrix Reasoning, Coding and Digit Span subtests  [place] Verbal Learning Test - 2nd Edition Banker) Short Form  Wechsler Memory Scale - Fourth Edition (WMS-IV) Older Adult Version (ages 79-90): Logical Memory I, II and Recognition subtests  Repeatable Battery for the Assessment of Neuropsychological Status (RBANS) Form A:  Figure Copy and Recall subtests and Semantic Fluency subtest  Neuropsychological Assessment Battery (NAB) Language Module, Form 1: Sports administrator (BNT)  Boston Diagnostic Aphasia Examination: Commands subtest  Controlled Oral Word Association Test (COWAT)  Trail Making Test A and B  Clock drawing test  Geriatric Depression Scale (GDS) 15 Item  Generalized Anxiety Disorder - 7 item screener (GAD-7)   Test Results: Note: Standardized scores are presented only for use by appropriately trained professionals and to allow for any future test-retest comparison. These scores should not be interpreted without consideration of all the information that is contained in the rest of the report. The most recent standardization samples from the test publisher or other sources were used whenever possible to derive standard scores; scores were corrected for age, gender, ethnicity and education when available.    Test Scores:  Test Name Raw Score Standardized Score Descriptor  TOPF 47/70 SS= 105 Average   WAIS-IV Subtests     Block Design 32/66 ss= 11 Average  Similarities 30/36 ss= 14 Superior  Matrix Reasoning 17/26 ss= 14 Superior  Coding 55/135 ss= 12 High average  Digit Span  30/48 ss= 13 High average  RBANS Subtests     Figure Copy 20/20 Z= 1.2 High average  Figure  Recall 15/20 Z= 0.6 Average  Semantic Fluency 11 Z= -1.7 Borderline  CVLT-II Scores     Trial 1 4/9 Z= -1 Low average  Trial 4 4/9 Z= -2 Impaired  Trials 1-4 total 17/36 T= 33 Borderline  SD Free Recall 3/9 Z= -2 Impaired  LD Free Recall 1/9 Z= -1.5 Borderline  LD Cued Recall 1/9 Z= -2.5 Impaired  Recognition Discriminability 7/9 hits; 0 false positive Z= 0.5 Average  Forced Choice Recognition 9/9  WNL  WMS-IV Subtests     LM I 32/53 ss= 11 Average  LM II 13/39 ss= 9 Average  LM II Recognition 21/23 Cum %: >75 Above average  NAB Language Subtest     Bill Payment 18/19 T= 55 Average  BNT 56/60 T= 57 High average  BDAE Subtest     Commands 15/15  WNL  COWAT-FAS 29 T= 39 Low average  COWAT-Animals 14 T= 40 Low average  Trail Making Test A  41" 0 errors T= 53 Average  Trail Making Test B  90" 0 errors T= 56 Average  Clock Drawing   WNL  GDS-15 1/15  WNL  GAD-7 2/21  WNL      Description of Test Results:   Premorbid verbal intellectual abilities were estimated to have been within the average range based on a test of word reading; this may be an underestimate given that his performance on other tests of both verbal and nonverbal "crystalized" intelligence was superior. Psychomotor processing speed was high average. Auditory attention and working memory were high average. Visual-spatial construction was average to high average. Language abilities were variable. Specifically, confrontation naming was high average, while semantic verbal fluency was low average to borderline impaired. Auditory comprehension of multi-step commands was intact. On a simulated bill payment task, requiring multiple aspects of receptive  and expressive language, the patient performed within the average range. With regard to verbal memory, encoding and acquisition of non-contextual information (i.e., word list) was borderline impaired across four learning trials. He demonstrated a flat learning curve. After a brief distracter task, free recall was impaired (3/9 items recalled). After a 10 minute delay, free recall was borderline impaired (1/9 items recalled). He did not benefit from semantic cueing, and cued recall was impaired (1/9 items recalled). Meanwhile, performance on a yes/no recognition task was average with no false positive errors. On another verbal memory test, encoding and acquisition of contextual auditory information (i.e., short stories) was average. After a delay, free recall was average. Performance on a yes/no recognition task was above average. With regard to non-verbal memory, delayed free recall of visual information was average. Executive functioning was intact overall. Mental flexibility and set-shifting were average on Trails B. Verbal fluency with phonemic search restrictions was low average. Verbal abstract reasoning was superior. Non-verbal abstract reasoning was superior. Performance on a clock drawing task was intact. On self-report questionnaires, the patient's responses were not indicative of clinically significant depression or anxiety at the present time.      Clinical Impressions: Mild vascular cognitive impairment, with mild decline since testing in 03/2015. Results of the current evaluation revealed many areas of intact cognitive function, including abstract reasoning (an area of relative strength in the superior range), processing speed and auditory attention (high average), visual spatial skills, confrontation naming, auditory comprehension, mental flexibility and set-shifting, and visual memory. With regard to verbal memory, his ability to learn and remember contextual information (ie, short stories) was  entirely normal, but his ability to learn and remember non-contextual information (  ie, word list) was impaired. His pattern of performances across memory tests suggests dysexecutive features rather than consolidation dysfunction. This represents a significant decline since his last evaluation almost a year ago. At that time, he was given a more challenging version of the same test (16-item word list instead of 9-item word list), yet his encoding and retrieval abilities fell in the average range. Relative to his last evaluation, the patient also continues to evidence mildly reduced verbal fluency (semantic fluency is consistent with a year ago; phonemic verbal fluency has declined).  Overall, these test results and the patient's current level of functioning are not consistent with dementia. However, a diagnosis of MCI is warranted. His cognitive profile, alongside history of neurogenic orthostatic hypotension and evidence of chronic small vessel disease on neuroimaging, suggests vascular etiology. I do not see evidence of developing Alzheimer's disease at this time. While the patient is experiencing some stress related to aging, he is not endorsing significant depression or anxiety.     Recommendations/Plan: Based on the findings of the present evaluation, the following recommendations are offered:   1. The patient will be educated regarding vascular cognitive impairment. Optimal control of vascular risk factors will be encouraged. He will be positively reinforced for his physical exercise and active lifestyle. 2. From a cognitive perspective, he appears able to manage all complex ADLs independently, including driving.  3. Strategies to maintain brain health and to enhance cognitive functioning in daily life will be reviewed with the patient. 4. Neuropsychological re-evaluation in 1-2 years is indicated in order to monitor cognitive status, track any progression of symptoms and further assist with  treatment planning.     Feedback to Patient: Adam Clements returned for a feedback appointment on 04/14/2016 to review the results of his neuropsychological evaluation with this provider. 30 minutes face-to-face time was spent reviewing his test results, my impressions and my recommendations as detailed above.      Total time spent on this patient's case: [id number] unit for interview with psychologist; [phone] units of testing by psychometrician under psychologist's supervision; [phone] units for medical record review, scoring of neuropsychological tests, interpretation of test results, preparation of this report, and review of results to the patient by psychologist.         Thank you for your referral of Adam Clements. Please feel free to contact me if you have any questions or concerns regarding this report.

## 2016-04-14 NOTE — Patient Instructions (Addendum)
Results of your cognitive evaluation were largely within normal limits for your age. Many areas were above average, including abstract reasoning (superior) and processing speed and auditory attention (high average).  However, you did have some difficulty with learning strategies -- ie, when disorganized information was presented to you, you had difficulty organizing it in such a way to remember it later.    Your test results and current level of functioning do NOT warrant a diagnosis of dementia. However, a diagnosis of Mild Cognitive Impairment (MCI) is appropriate at this time. I have given you written information on MCI.   I have the following recommendations: 1. Continue optimal control of vascular risk factors (keep up the great work with regular exercise!) 2. Continue to participate in activities which provide mental stimulation and social interaction. 3. Neurocognitive re-assessment in one year is recommended. 4. Below are strategies that can help enhance cognitive functioning in everyday life.   Strategies to enhance cognitive functioning Attention, concentration, memory encoding and consolidation    . Make a plan and be prepared o If you find that you are more attentive at certain times of the day (i.e., the morning), plan important activities and discussions at that time o Determine which activities take the most time and which are most important, then prioritize your "to do list" based on this information o Break tasks into simpler parts, understand the steps involved before starting o Rehearse the steps mentally or write them down. If you write them down, you can use this as a checklist to check off as you complete them. o Visualize completing the task  . Use external aids  o Write everything down that you do not need to know or work with right now. Don't store extra information in your brain that you don't need right now.  o Use a calendar or planner to make checklists, due  dates and "to do" lists. o Use ONLY ONE calendar or planner and look at it frequently o Set alarms for important deadlines or appointments  . Minimize interruptions and distractions  o Find a good work environment, e.g., quiet room with a desk, close curtains, use earplugs, mask sounds with a fan or white noise machine o Turn off cell phone and/or email alerts during important tasks. In fact, it is helpful to schedule a block of time each day where you limit phone and email interruptions and focus on just the more detailed work you have. o Try to minimize the amount of background noise (i.e., television, music) when engaged in important tasks or conversations with others (note that some individuals find soft background music helpful in minimize distraction, so you may need to experiment with optimal level of noise for specific situations)  . Use active effort = consciously attending to details, closely analyzing o Failures of encoding may reflect failure to attend to one's own actions o Be prepared to work more slowly than you usually do  o When reading, allow time for re-reading sections  o Check your work for errors  . Avoid multitasking o Do not attempt to complete more than one task at one time. Focus on one task until it is completed and then move on to the next one. o Avoid other activities while engaged in important tasks, such as talking on the phone while balancing the checkbook, making a shopping list during a meeting.   . Use self-talk during tasks o Repeat the steps of the activity to yourself as you complete them o Talk  to yourself about your progress o This helps you maintain focus on the task and makes it easier to remember completing the task (Similar to "active effort" above)  . Conserve energy o Conserve energy to avoid fatigue and its effects on cognition - Get enough sleep - Pace yourself  and make sure to take breaks - Be open to receiving help - Exercise for  increased energy  . Conversational vigilance = paying attention during a conversation  o Listen actively: focus on the speaker and position yourself so that you can clearly hear the him/her, and have open/relaxed posture  o Eye contact: Maintaining eye contact with the person you are speaking with may increase the likelihood that important information is properly received  o Ask questions: Ask questions for clarification (e.g., request that the speaker explain something in a different way) or ask for information to be repeated if you become distracted, or if you do not hear or understand something during a conversation  o Paraphrase: Summarize or repeat back important information from a conversation in your own words to facilitate communication and ensure that you have heard correctly and understand

## 2016-04-15 ENCOUNTER — Encounter: Payer: Medicare Other | Admitting: Psychology

## 2016-05-03 ENCOUNTER — Other Ambulatory Visit: Payer: Self-pay | Admitting: Family Medicine

## 2016-07-08 ENCOUNTER — Ambulatory Visit (INDEPENDENT_AMBULATORY_CARE_PROVIDER_SITE_OTHER): Payer: Medicare Other | Admitting: Psychology

## 2016-07-08 ENCOUNTER — Encounter: Payer: Self-pay | Admitting: Psychology

## 2016-07-08 DIAGNOSIS — F4320 Adjustment disorder, unspecified: Secondary | ICD-10-CM

## 2016-07-08 DIAGNOSIS — R413 Other amnesia: Secondary | ICD-10-CM

## 2016-07-08 DIAGNOSIS — I679 Cerebrovascular disease, unspecified: Secondary | ICD-10-CM

## 2016-07-08 DIAGNOSIS — G9389 Other specified disorders of brain: Secondary | ICD-10-CM

## 2016-07-08 DIAGNOSIS — G3184 Mild cognitive impairment, so stated: Secondary | ICD-10-CM

## 2016-07-08 NOTE — Patient Instructions (Signed)
Clinical Impressions: Mild vascular cognitive impairment, with mild decline since testing in 03/2015. Results of the current evaluation revealed many areas of intact cognitive function, including abstract reasoning (an area of relative strength in the superior range), processing speed and auditory attention (high average), visual spatial skills, confrontation naming, auditory comprehension, mental flexibility and set-shifting, and visual memory. With regard to verbal memory, his ability to learn and remember contextual information (ie, short stories) was entirely normal, but his ability to learn and remember non-contextual information (ie, word list) was impaired. His pattern of performances across memory tests suggests dysexecutive features rather than consolidation dysfunction. This represents a significant decline since his last evaluation almost a year ago. At that time, he was given a more challenging version of the same test (16-item word list instead of 9-item word list), yet his encoding and retrieval abilities fell in the average range. Relative to his last evaluation, the patient also continues to evidence mildly reduced verbal fluency (semantic fluency is consistent with a year ago; phonemic verbal fluency has declined).  Overall, these test results and the patient's current level of functioning are NOT consistent with dementia. However, a diagnosis of MCI is warranted. His cognitive profile, history of neurocardiogenic orthostatic hypotension with repeated hypoprofusion, and evidence of chronic small vessel disease on neuroimaging, all suggest a vascular etiology.  While the patient is experiencing some stress related to aging, he is not endorsing clinically significant depression or anxiety.  Below is some information on vascular cognitive impairment.   Vascular cognitive impairment is a change in thinking skills as a result of conditions that block or reduce blood flow to various regions of the  brain. To be healthy and function properly, brain cells need an adequate supply of oxygen and nutrients. Oxygen and nutrients are delivered to brain cells via the blood, which travels through a network of vessels called the vascular system. If the vascular system becomes damaged, brain cells cannot get the oxygen and nutrients they need and they will eventually die.   This decreased oxygen to the brain very often causes changes to the small vessels of the brain and can result in both subtle and more noticeable thinking and memory problems. These thinking difficulties can begin as mild changes that worsen gradually over time as a result of multiple small strokes or small vessel ischemic disease. Small vessel ischemic disease occurs when the small blood vessels that lead to the brain do not receive an adequate supply of blood over a relatively long period of time. Small vessel ischemic disease results from a history of multiple vascular risk factors (see below). It is also known by several other terms such as "microvascular disease" and "white matter disease". Common early signs of small vessel ischemic disease are reduced ability to pay attention, slowed information processing, difficulty finding the right words, and impaired planning and judgment.   There are a number of conditions that can damage the vascular system, including high blood pressure, high cholesterol, heart problems, and diabetes. Smoking, obesity, heavy alcohol use, and stress can also result in damage to the vascular system. All of these variables are referred to as "vascular risk factors." The more risk factors one has, the greater the likelihood that he or she will experience problems in thinking and memory.   Reducing Risk Any condition that damages blood vessels anywhere in the body can cause brain changes linked to vascular cognitive impairment. However, the following strategies can help to protect the brain and reduce one's risk of  cognitive decline:    . Don't smoke . Keep blood pressure, cholesterol and blood sugar within recommended limits . Engage in consistent, safe cardiovascular exercise . Maintain compliance with prescribed medications . Eat a healthy, balanced diet . Maintain a healthy weight . Limit alcohol consumption

## 2016-07-08 NOTE — Progress Notes (Addendum)
    Neuropsychology Note    Name:                                     Adam Clements         Date of Birth:                           27-Nov-1934 Date and Time of Session:  07/08/2016          12:30 pm - 1:00 pm                          Background Information:  Adam Clements is a 82 y.o., right-handed male referred by Dr. Metta Clines to assess his current level of cognitive functioning and assist in differential diagnosis. He completed neuropsychological evaluation with me in December 2017, and results were provided to him in January 2018. (Please see full report for details.)  07/08/2016 follow-up: Adam Clements was seen today for follow-up regarding his previous evaluation. We reviewed the results of that evaluation and education was provided on vascular cognitive impairment. Adam Clements reported that since we last met, he is experiencing significantly increased stress related to his wife's recent diagnosis of breast cancer. He would like assistance in learning how to better help/support her, and in learning how to better manage stress in general. He noted that he also has stress related to his children, and he continues to have a very difficult time adjusting to reduced physical stamina and dizziness, which limit his physical activity. We discussed the importance of self-care, and I described the value of counseling for stress management. The patient was amenable to a referral to [facility]. I will place this referral for him. I believe he is an excellent candidate for time limited CBT focused on stress management.   Plan: Referral to [facility] was placed for individual counseling for stress management.

## 2016-07-19 ENCOUNTER — Other Ambulatory Visit: Payer: Self-pay | Admitting: Internal Medicine

## 2016-07-21 ENCOUNTER — Ambulatory Visit (INDEPENDENT_AMBULATORY_CARE_PROVIDER_SITE_OTHER): Payer: Medicare Other | Admitting: Family Medicine

## 2016-07-21 ENCOUNTER — Encounter: Payer: Self-pay | Admitting: Family Medicine

## 2016-07-21 VITALS — BP 136/78 | HR 64 | Temp 97.6°F | Wt 154.0 lb

## 2016-07-21 DIAGNOSIS — H9193 Unspecified hearing loss, bilateral: Secondary | ICD-10-CM

## 2016-07-21 DIAGNOSIS — I951 Orthostatic hypotension: Secondary | ICD-10-CM | POA: Diagnosis not present

## 2016-07-21 DIAGNOSIS — R5383 Other fatigue: Secondary | ICD-10-CM

## 2016-07-21 DIAGNOSIS — R42 Dizziness and giddiness: Secondary | ICD-10-CM | POA: Diagnosis not present

## 2016-07-21 DIAGNOSIS — Z638 Other specified problems related to primary support group: Secondary | ICD-10-CM | POA: Diagnosis not present

## 2016-07-21 DIAGNOSIS — H903 Sensorineural hearing loss, bilateral: Secondary | ICD-10-CM | POA: Insufficient documentation

## 2016-07-21 NOTE — Progress Notes (Signed)
Pre visit review using our clinic review tool, if applicable. No additional management support is needed unless otherwise documented below in the visit note. 

## 2016-07-21 NOTE — Patient Instructions (Addendum)
Try to take morning pills at breakfast, not right when you wake up. Only medicine that may be beneficial to take right when you wake up is florinef.  Increase water throughout the day.  Good to see you today, call us with questions.  Schedule wellness visit at your convenience.  Return in 1-2 months for medicare wellness visit.

## 2016-07-21 NOTE — Assessment & Plan Note (Signed)
Audiology referral to [facility] per patient request.

## 2016-07-21 NOTE — Progress Notes (Signed)
BP 136/78 (BP Location: Right Arm, Cuff Size: Normal)   Pulse 64   Temp 97.6 F (36.4 C) (Oral)   Wt 154 lb (69.9 kg)   SpO2 97%   BMI 24.12 kg/m    CC: fatigue Subjective:    Patient ID: Adam Clements, male    DOB: 07/16/1934, 82 y.o.   MRN: [id number]  HPI: Adam Clements is a 82 y.o. male presenting on 07/21/2016 for Fatigue   Seen by [place] - referred to [place] for individual counseling and stress management.  Saw Dr Tomi Likens 03/2016, note reviewed. rec compression stockings placed while in bed.  Some family stressors. Wife recently diagnosed with breast cancer s/p surgery.   Known orthostasis currently on florinef 0.1mg  daily. Also on pletal, aggrenox.   Has not been checking blood pressures recently.  Has not passed out in over a year.   Main concern is first hour of the day - he feels especially fatigued and tired and weak, dizzy at times, attributes to low blood pressure. He takes AM meds right when he wakes up - around [place] second dose of medicines at 9pm. He does not do as well as he'd like with water intake.   Some finger paresthesias.  Noticing some decreased stamina.   He has appt scheduled with counselor Dr Lauris Chroman.   Relevant past medical, surgical, family and social history reviewed and updated as indicated. Interim medical history since our last visit reviewed. Allergies and medications reviewed and updated. Outpatient Medications Prior to Visit  Medication Sig Dispense Refill  . Blood Pressure Monitoring (BLOOD PRESSURE MONITOR AUTOMAT) DEVI 1 Units by Does not apply route as needed. 1 Device 0  . cholecalciferol (VITAMIN D) 1000 units tablet Take 1,000 Units by mouth daily.    . cilostazol (PLETAL) 50 MG tablet take 1 tablet by mouth twice a day 60 tablet 5  . dipyridamole-aspirin (AGGRENOX) 200-25 MG 12hr capsule take 1 capsule by mouth twice a day 60 capsule 11  . fludrocortisone (FLORINEF) 0.1 MG tablet take  1 tablet by mouth twice a day 180 tablet 1  . pravastatin (PRAVACHOL) 20 MG tablet take 1 tablet by mouth once daily 30 tablet 6  . vitamin B-12 (CYANOCOBALAMIN) 500 MCG tablet Take 500 mcg by mouth daily.     No facility-administered medications prior to visit.      Per HPI unless specifically indicated in ROS section below Review of Systems     Objective:    BP 136/78 (BP Location: Right Arm, Cuff Size: Normal)   Pulse 64   Temp 97.6 F (36.4 C) (Oral)   Wt 154 lb (69.9 kg)   SpO2 97%   BMI 24.12 kg/m   Wt Readings from Last 3 Encounters:  07/21/16 154 lb (69.9 kg)  04/07/16 154 lb 1.6 oz (69.9 kg)  04/02/16 156 lb (70.8 kg)    Physical Exam  Constitutional: He is oriented to person, place, and time. He appears well-developed and well-nourished. No distress.  HENT:  Head: Normocephalic and atraumatic.  Right Ear: Decreased hearing is noted.  Left Ear: Decreased hearing is noted.  Mouth/Throat: Oropharynx is clear and moist. No oropharyngeal exudate.  Hearing aides in place, very hard of hearing regardless  Eyes: Conjunctivae and EOM are normal. Pupils are equal, round, and reactive to light.  Neck: No thyromegaly present.  Cardiovascular: Normal rate, regular rhythm, normal heart sounds and intact distal pulses.   No murmur heard. Pulmonary/Chest:  Effort normal and breath sounds normal. No respiratory distress. He has no wheezes. He has no rales.  Musculoskeletal: He exhibits no edema.  Neurological: He is alert and oriented to person, place, and time.  Skin: Skin is warm and dry. No rash noted.  Psychiatric: He has a normal mood and affect. His behavior is normal. Judgment and thought content normal.  Nursing note and vitals reviewed.  Results for orders placed or performed in visit on 01/29/16  Vitamin B12  Result Value Ref Range   Vitamin B-12 574 211 - 911 pg/mL  Comprehensive metabolic panel  Result Value Ref Range   Sodium 140 135 - 145 mEq/L   Potassium  3.8 3.5 - 5.1 mEq/L   Chloride 105 96 - 112 mEq/L   CO2 29 19 - 32 mEq/L   Glucose, Bld 105 (H) 70 - 99 mg/dL   BUN 25 (H) 6 - 23 mg/dL   Creatinine, Ser 1.31 0.40 - 1.50 mg/dL   Total Bilirubin 0.5 0.2 - 1.2 mg/dL   Alkaline Phosphatase 49 39 - 117 U/L   AST 16 0 - 37 U/L   ALT 14 0 - 53 U/L   Total Protein 5.9 (L) 6.0 - 8.3 g/dL   Albumin 3.6 3.5 - 5.2 g/dL   Calcium 8.8 8.4 - 10.5 mg/dL   GFR 55.99 (L) >60.00 mL/min  TSH  Result Value Ref Range   TSH 1.59 0.35 - 4.50 uIU/mL  CBC with Differential/Platelet  Result Value Ref Range   WBC 4.4 4.0 - 10.5 K/uL   RBC 3.84 (L) 4.22 - 5.81 Mil/uL   Hemoglobin 12.5 (L) 13.0 - 17.0 g/dL   HCT 36.6 (L) 39.0 - 52.0 %   MCV 95.4 78.0 - 100.0 fl   MCHC 34.1 30.0 - 36.0 g/dL   RDW 12.9 11.5 - 15.5 %   Platelets 193.0 150.0 - 400.0 K/uL   Neutrophils Relative % 55.6 43.0 - 77.0 %   Lymphocytes Relative 24.8 12.0 - 46.0 %   Monocytes Relative 11.2 3.0 - 12.0 %   Eosinophils Relative 7.8 (H) 0.0 - 5.0 %   Basophils Relative 0.6 0.0 - 3.0 %   Neutro Abs 2.5 1.4 - 7.7 K/uL   Lymphs Abs 1.1 0.7 - 4.0 K/uL   Monocytes Absolute 0.5 0.1 - 1.0 K/uL   Eosinophils Absolute 0.3 0.0 - 0.7 K/uL   Basophils Absolute 0.0 0.0 - 0.1 K/uL  POCT Urinalysis Dipstick (Automated)  Result Value Ref Range   Color, UA Yellow    Clarity, UA Clear    Glucose, UA Negative    Bilirubin, UA Negative    Ketones, UA Negative    Spec Grav, UA >=1.030    Blood, UA Trace    pH, UA 5.5    Protein, UA Negative    Urobilinogen, UA 0.2    Nitrite, UA Negative    Leukocytes, UA Negative Negative   Lab Results  Component Value Date   CHOL 144 08/21/2015   HDL 63.20 08/21/2015   LDLCALC 72 08/21/2015   LDLDIRECT 73.0 01/02/2015   TRIG 43.0 08/21/2015   CHOLHDL 2 08/21/2015      Assessment & Plan:  Over 25 minutes were spent face-to-face with the patient during this encounter and >50% of that time was spent on counseling and coordination of care  Problem List  Items Addressed This Visit    Bilateral hearing loss    Audiology referral to [facility] per patient request.  Relevant Orders   Ambulatory referral to Audiology   Decreased stamina - Primary    Worse at beginning of the day - anticipate partly related to taking several of these meds first thing in the morning (especially pletal). rec take AM meds with breakfast (2 hours after he wakes up). Ok to continue florinef upon awakening. Anticipate age contributing. Endorses some paresthesias of fingers. Will check vitamin levels at medicare wellness visit.       Dizziness    Ongoing, mild, anticipate due to orthostatic changes.       Orthostatic hypotension    Well managed on florinef 0.1mg  BID. Continue.       Stress due to family tension    Has been referred to behavioral health for counseling.       Syncope due to orthostatic hypotension    No episodes in over a year.           Follow up plan: Return in about 2 months (around 09/20/2016) for medicare wellness visit, annual exam, prior fasting for blood work.  Ria Bush, MD

## 2016-07-21 NOTE — Assessment & Plan Note (Addendum)
Well managed on florinef 0.1mg  BID. Continue.

## 2016-07-21 NOTE — Assessment & Plan Note (Signed)
Has been referred to behavioral health for counseling.

## 2016-07-21 NOTE — Assessment & Plan Note (Signed)
No episodes in over a year. 

## 2016-07-21 NOTE — Assessment & Plan Note (Signed)
Ongoing, mild, anticipate due to orthostatic changes.

## 2016-07-21 NOTE — Assessment & Plan Note (Addendum)
Worse at beginning of the day - anticipate partly related to taking several of these meds first thing in the morning (especially pletal). rec take AM meds with breakfast (2 hours after he wakes up). Ok to continue florinef upon awakening. Anticipate age contributing. Endorses some paresthesias of fingers. Will check vitamin levels at medicare wellness visit.

## 2016-08-01 DIAGNOSIS — H903 Sensorineural hearing loss, bilateral: Secondary | ICD-10-CM | POA: Diagnosis not present

## 2016-08-15 ENCOUNTER — Encounter: Payer: Self-pay | Admitting: Family Medicine

## 2016-08-15 ENCOUNTER — Other Ambulatory Visit: Payer: Self-pay | Admitting: Family Medicine

## 2016-08-20 ENCOUNTER — Other Ambulatory Visit: Payer: Self-pay | Admitting: Family Medicine

## 2016-08-20 NOTE — Telephone Encounter (Signed)
Last OV 4/30

## 2016-09-01 ENCOUNTER — Telehealth: Payer: Self-pay | Admitting: Family Medicine

## 2016-09-01 ENCOUNTER — Telehealth: Payer: Self-pay | Admitting: Internal Medicine

## 2016-09-01 NOTE — Telephone Encounter (Signed)
Spoke with patient, yesterday and today he is feeling worse.  Says, "can't do anything" without feeling lightheaded.  I have asked him to do as Dr Rayann Heman instructed him back in January:       Has dysautonomia and chronic hypotension      Discussed given mornings tend to be where he has most of his symptoms, elevating the head of his bed a couple inches      He has been instructed to not climb ladders or roof work etc that would put him at fall/injury risk      He wears support stockings      re-counseled on standing slowly, avoiding long periods of standing and to sit if symptoms, to keep adequately hydrated      We discussed increasing the florinef to BID for a couple days when he is having those days that he is getting symptoms, is instructed to monitor closely for fluid accumulation, swelling/SOB should he do this. If he has any further fainting to notify us.  He is wearing his support hose regularly and still having pressures 77/40 and says he is well hydrated.  We are going to have him try increasing his Florinef over the next few days and will touch base on Thurs afternoon.  Dr Rayann Heman says if no better he will need to follow up with his PCP or Dr Danise Mina.

## 2016-09-01 NOTE — Telephone Encounter (Signed)
Unfortunately, I would not be able to accept at this time, thanks 

## 2016-09-01 NOTE — Telephone Encounter (Addendum)
He is a pleasure. Ok by me if Dr Jenny Reichmann accepts.  I don't know reason behind desiring transfer. ?location as he lives in [place].

## 2016-09-01 NOTE — Telephone Encounter (Signed)
New message   Pt c/o Syncope: STAT if syncope occurred within 30 minutes and pt complains of lightheadedness High Priority if episode of passing out, completely, today or in last 24 hours   1. Did you pass out today? no  2. When is the last time you passed out? months  3. Has this occurred multiple times? Yes getting worse  4. Did you have any symptoms prior to passing out? no

## 2016-09-01 NOTE — Telephone Encounter (Signed)
Patient is requesting to transfer from [place] to [place].  Please advise.

## 2016-09-02 NOTE — Telephone Encounter (Signed)
Notified patient.

## 2016-09-05 NOTE — Telephone Encounter (Signed)
Spoke with patient and the increase of Florinef he felt helped the first day.  He says he noticed improvement the first day but the days following were not as significant.  I am going to call him back on Mon to see how he has done over the weekend.

## 2016-09-08 ENCOUNTER — Other Ambulatory Visit: Payer: Self-pay | Admitting: Family Medicine

## 2016-09-08 DIAGNOSIS — R42 Dizziness and giddiness: Secondary | ICD-10-CM

## 2016-09-08 DIAGNOSIS — E559 Vitamin D deficiency, unspecified: Secondary | ICD-10-CM

## 2016-09-08 DIAGNOSIS — I951 Orthostatic hypotension: Secondary | ICD-10-CM

## 2016-09-08 DIAGNOSIS — M1A09X0 Idiopathic chronic gout, multiple sites, without tophus (tophi): Secondary | ICD-10-CM

## 2016-09-08 DIAGNOSIS — E785 Hyperlipidemia, unspecified: Secondary | ICD-10-CM

## 2016-09-08 DIAGNOSIS — D509 Iron deficiency anemia, unspecified: Secondary | ICD-10-CM

## 2016-09-09 ENCOUNTER — Ambulatory Visit: Payer: Medicare Other | Admitting: Psychiatry

## 2016-09-09 NOTE — Telephone Encounter (Signed)
Left message for patient to call me back. 

## 2016-09-11 ENCOUNTER — Ambulatory Visit (INDEPENDENT_AMBULATORY_CARE_PROVIDER_SITE_OTHER): Payer: Medicare Other

## 2016-09-11 VITALS — BP 130/70 | HR 51 | Temp 97.7°F | Ht 67.5 in | Wt 149.0 lb

## 2016-09-11 DIAGNOSIS — I951 Orthostatic hypotension: Secondary | ICD-10-CM

## 2016-09-11 DIAGNOSIS — E559 Vitamin D deficiency, unspecified: Secondary | ICD-10-CM | POA: Diagnosis not present

## 2016-09-11 DIAGNOSIS — D509 Iron deficiency anemia, unspecified: Secondary | ICD-10-CM

## 2016-09-11 DIAGNOSIS — E785 Hyperlipidemia, unspecified: Secondary | ICD-10-CM

## 2016-09-11 DIAGNOSIS — Z0000 Encounter for general adult medical examination without abnormal findings: Secondary | ICD-10-CM

## 2016-09-11 DIAGNOSIS — R42 Dizziness and giddiness: Secondary | ICD-10-CM

## 2016-09-11 DIAGNOSIS — M1A09X0 Idiopathic chronic gout, multiple sites, without tophus (tophi): Secondary | ICD-10-CM | POA: Diagnosis not present

## 2016-09-11 LAB — COMPREHENSIVE METABOLIC PANEL
ALT: 12 U/L (ref 0–53)
AST: 15 U/L (ref 0–37)
Albumin: 3.5 g/dL (ref 3.5–5.2)
Alkaline Phosphatase: 40 U/L (ref 39–117)
BILIRUBIN TOTAL: 0.6 mg/dL (ref 0.2–1.2)
BUN: 23 mg/dL (ref 6–23)
CALCIUM: 8.9 mg/dL (ref 8.4–10.5)
CHLORIDE: 105 meq/L (ref 96–112)
CO2: 28 meq/L (ref 19–32)
CREATININE: 1.17 mg/dL (ref 0.40–1.50)
GFR: 63.69 mL/min (ref >60.00)
GLUCOSE: 96 mg/dL (ref 70–99)
Potassium: 3.7 mEq/L (ref 3.5–5.1)
SODIUM: 139 meq/L (ref 135–145)
Total Protein: 5.6 g/dL — ABNORMAL LOW (ref 6.0–8.3)

## 2016-09-11 LAB — LIPID PANEL
CHOL/HDL RATIO: 2
Cholesterol: 143 mg/dL (ref 0–200)
HDL: 63.5 mg/dL (ref >39.00)
LDL CALC: 71 mg/dL (ref 0–99)
NONHDL: 79.4
Triglycerides: 44 mg/dL (ref 0.0–149.0)
VLDL: 8.8 mg/dL (ref 0.0–40.0)

## 2016-09-11 LAB — CBC WITH DIFFERENTIAL/PLATELET
BASOS PCT: 0.9 % (ref 0.0–3.0)
Basophils Absolute: 0 10*3/uL (ref 0.0–0.1)
EOS ABS: 0.1 10*3/uL (ref 0.0–0.7)
EOS PCT: 3 % (ref 0.0–5.0)
HEMATOCRIT: 33.4 % — AB (ref 39.0–52.0)
HEMOGLOBIN: 11.5 g/dL — AB (ref 13.0–17.0)
Lymphocytes Relative: 21.9 % (ref 12.0–46.0)
Lymphs Abs: 1.1 10*3/uL (ref 0.7–4.0)
MCHC: 34.4 g/dL (ref 30.0–36.0)
MCV: 96.6 fl (ref 78.0–100.0)
Monocytes Absolute: 0.5 10*3/uL (ref 0.1–1.0)
Monocytes Relative: 11.2 % (ref 3.0–12.0)
NEUTROS ABS: 3.1 10*3/uL (ref 1.4–7.7)
Neutrophils Relative %: 63 % (ref 43.0–77.0)
PLATELETS: 210 10*3/uL (ref 150.0–400.0)
RBC: 3.46 Mil/uL — ABNORMAL LOW (ref 4.22–5.81)
RDW: 12.8 % (ref 11.5–15.5)
WBC: 4.9 10*3/uL (ref 4.0–10.5)

## 2016-09-11 LAB — VITAMIN D 25 HYDROXY (VIT D DEFICIENCY, FRACTURES): VITD: 43.28 ng/mL (ref 30.00–100.00)

## 2016-09-11 LAB — TSH: TSH: 1.89 u[IU]/mL (ref 0.35–4.50)

## 2016-09-11 LAB — URIC ACID: URIC ACID, SERUM: 4.7 mg/dL (ref 4.0–7.8)

## 2016-09-11 NOTE — Progress Notes (Signed)
Pre visit review using our clinic review tool, if applicable. No additional management support is needed unless otherwise documented below in the visit note. 

## 2016-09-11 NOTE — Progress Notes (Signed)
PCP notes:   Health maintenance:  No gaps identified  Abnormal screenings:   None  Patient concerns:   Pt states he wants to discuss decreased stamina and "swimming" in his head. Pt also wants to discuss Shingrix vaccine.   Nurse concerns:  None  Next PCP appt:   09/15/16 @ 1030

## 2016-09-11 NOTE — Progress Notes (Signed)
Subjective:   Adam Clements is a 82 y.o. male who presents for Medicare Annual/Subsequent preventive examination.  Review of Systems:  N/A Cardiac Risk Factors include: advanced age (>71men, >35 women);male gender     Objective:    Vitals: BP 130/70 (BP Location: Right Arm, Patient Position: Sitting, Cuff Size: Normal)   Pulse (!) 51   Temp 97.7 F (36.5 C) (Oral)   Ht 5' 7.5" (1.715 m) Comment: no shoes  Wt 149 lb (67.6 kg)   SpO2 97%   BMI 22.99 kg/m   Body mass index is 22.99 kg/m.  Tobacco History  Smoking Status  . Former Smoker  . Packs/day: 2.00  . Types: Cigarettes  . Quit date: 11/10/1982  Smokeless Tobacco  . 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 Allergic rhinitis   . Arthritis    Gout  . BPH (benign prostatic hypertrophy)   . Bradycardia   . CAD (coronary artery disease)    nonobstructive  . Chronic anemia 2013   h/o IDA, did not tolerate oral iron, latest normal iron studies, B12, folate  . Diverticulosis of colon    conoscopy 12/1993 (Dr. Amedeo Plenty) S/G divertics//colonoscopy L&R divertics (Dr. Amedeo Plenty) 11/08/2004  . Dizziness   . Gout   . HTN (hypertension)   . Memory loss   . Mitral regurgitation    Pulmonic regurgitation, with marked LA enlargement  . Nonischemic cardiomyopathy (HCC)    EF 40%  . Orthostatic hypotension   . PVC (premature ventricular contraction)   . PVD (peripheral vascular disease) ([place])    followed by Dr Trula Slade, treating medically (pletal and aspirin)  . RBBB longstanding  . Squamous cell carcinoma in situ of skin 2015   L ear  . Syncope    Past Surgical History:  Procedure Laterality Date  . 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 APPENDECTOMY    . dexa  2004   "shrunk 2 inches", WNL   Family History  Problem Relation Age of Onset  . Heart disease Mother 69       Natural causes with CHF  . Hypertension Mother   . Hypertension Father   . Stroke Father    cerebral hemorrhage  . Hyperlipidemia Brother   . Heart disease Brother        before age 68  . Hypertension Brother   . Heart attack Brother   . Hypertension Unknown    History  Sexual Activity  . Sexual activity: No    Outpatient Encounter Prescriptions as of 09/11/2016  Medication Sig  . Blood Pressure Monitoring (BLOOD PRESSURE MONITOR AUTOMAT) DEVI 1 Units by Does not apply route as needed.  . cholecalciferol (VITAMIN D) 1000 units tablet Take 1,000 Units by mouth daily.  . cilostazol (PLETAL) 50 MG tablet take 1 tablet by mouth twice a day  . dipyridamole-aspirin (AGGRENOX) 200-25 MG 12hr capsule take 1 capsule by mouth twice a day  . fludrocortisone (FLORINEF) 0.1 MG tablet take 1 tablet by mouth twice a day  . pravastatin (PRAVACHOL) 20 MG tablet take 1 tablet by mouth once daily  . vitamin B-12 (CYANOCOBALAMIN) 500 MCG tablet Take 500 mcg by mouth daily.   No facility-administered encounter medications on file as of 09/11/2016.     Activities of Daily Living In your present state of health, do you have any difficulty performing the following activities: 09/11/2016  Hearing? Y  Vision? N  Difficulty concentrating or making decisions? N  Walking or climbing stairs? Y  Dressing or bathing? N  Doing errands, shopping? N  Preparing Food and eating ? N  Using the Toilet? N  In the past six months, have you accidently leaked urine? N  Do you have problems with loss of bowel control? N  Managing your Medications? N  Managing your Finances? N  Housekeeping or managing your Housekeeping? N  Some recent data might be hidden    Patient Care Team: Ria Bush, MD as PCP - General (Family Medicine)   Assessment:    Hearing Screening Comments: Bilateral hearing aids Vision Screening Comments: Last vision exam in 2017 @ [facility]  Exercise Activities and Dietary recommendations Current Exercise Habits: Home exercise routine, Type of exercise: strength  training/weights;stretching;treadmill, Time (Minutes): 60, Frequency (Times/Week): 3, Weekly Exercise (Minutes/Week): 180, Intensity: Moderate, Exercise limited by: None identified  Goals    . Increase physical activity          Starting 09/11/2016, I will continue to exercise 60-90 min 3 days per week.       Fall Risk Fall Risk  09/11/2016 04/07/2016 02/06/2016 08/21/2015 06/04/2015  Falls in the past year? No No Yes No No  Number falls in past yr: - - 1 - -  Injury with Fall? - - - - -  Follow up - - - - -   Depression Screen PHQ 2/9 Scores 09/11/2016 08/21/2015 12/12/2013 11/10/2012  PHQ - 2 Score 0 0 0 0    Cognitive Function MMSE - Mini Mental State Exam 09/11/2016 02/20/2015  Orientation to time 5 5  Orientation to Place 5 5  Registration 3 3  Attention/ Calculation 0 5  Recall 3 1  Language- name 2 objects 0 2  Language- repeat 1 1  Language- follow 3 step command 3 3  Language- read & follow direction 0 1  Write a sentence 0 1  Copy design 0 1  Total score 20 28   PLEASE NOTE: A Mini-Cog screen was completed. Maximum score is 20. A value of 0 denotes this part of Folstein MMSE was not completed or the patient failed this part of the Mini-Cog screening.   Mini-Cog Screening Orientation to Time - Max 5 pts Orientation to Place - Max 5 pts Registration - Max 3 pts Recall - Max 3 pts Language Repeat - Max 1 pts Language Follow 3 Step Command - Max 3 pts  Montreal Cognitive Assessment  06/04/2015  Visuospatial/ Executive (0/5) 4  Naming (0/3) 3  Attention: Read list of digits (0/2) 2  Attention: Read list of letters (0/1) 1  Attention: Serial 7 subtraction starting at 100 (0/3) 3  Language: Repeat phrase (0/2) 2  Language : Fluency (0/1) 1  Abstraction (0/2) 2  Delayed Recall (0/5) 2  Orientation (0/6) 6  Total 26  Adjusted Score (based on education) 26      Immunization History  Administered Date(s) Administered  . Influenza Whole 12/30/2007  . Influenza,  High Dose Seasonal PF 11/27/2014  . Influenza,inj,Quad PF,36+ Mos 12/03/2012, 12/12/2013  . Influenza-Unspecified 12/18/2015  . Pneumococcal Conjugate-13 12/12/2013  . Pneumococcal Polysaccharide-23 06/13/2002  . Td 09/21/2005, 03/24/2010  . Zoster 12/30/2007   Screening Tests Health Maintenance  Topic Date Due  . DTaP/Tdap/Td (1 - Tdap) 03/24/2020 (Originally 03/25/2010)  . INFLUENZA VACCINE  10/22/2016  . TETANUS/TDAP  03/24/2020  . PNA vac Low Risk Adult  Completed      Plan:  I have personally reviewed and addressed the Medicare Annual Wellness questionnaire and have noted the following in the patient's chart:  A. Medical and social history B. Use of alcohol, tobacco or illicit drugs  C. Current medications and supplements D. Functional ability and status E.  Nutritional status F.  Physical activity G. Advance directives H. List of other physicians I.  Hospitalizations, surgeries, and ER visits in previous 12 months J.  [place] to include hearing, vision, cognitive, depression L. Referrals and appointments - none  In addition, I have reviewed and discussed with patient certain preventive protocols, quality metrics, and best practice recommendations. A written personalized care plan for preventive services as well as general preventive health recommendations were provided to patient.  See attached scanned questionnaire for additional information.   Signed,   Lindell Noe, MHA, BS, LPN Health Coach

## 2016-09-11 NOTE — Telephone Encounter (Signed)
F/u message ° °Pt returning RN call. Please call back to discuss  °

## 2016-09-11 NOTE — Telephone Encounter (Signed)
Per pt is still not feeling well continues to c/o sob,"swimmy headed", and  B/p dropping  Has not passed out ,but symptoms are interfering wth lifestyle unable to do what  pt wants to do.Will forward to  Dr Rayann Heman and  Janan Halter Rn for review .Adonis Housekeeper

## 2016-09-11 NOTE — Patient Instructions (Signed)
Adam Clements , Thank you for taking time to come for your Medicare Wellness Visit. I appreciate your ongoing commitment to your health goals. Please review the following plan we discussed and let me know if I can assist you in the future.   These are the goals we discussed: Goals    . Increase physical activity          Starting 09/11/2016, I will continue to exercise 60-90 min 3 days per week.        This is a list of the screening recommended for you and due dates:  Health Maintenance  Topic Date Due  . DTaP/Tdap/Td vaccine (1 - Tdap) 03/24/2020*  . Flu Shot  10/22/2016  . Tetanus Vaccine  03/24/2020  . Pneumonia vaccines  Completed  *Topic was postponed. The date shown is not the original due date.   Preventive Care for Adults  A healthy lifestyle and preventive care can promote health and wellness. Preventive health guidelines for adults include the following key practices.  . A routine yearly physical is a good way to check with your health care provider about your health and preventive screening. It is a chance to share any concerns and updates on your health and to receive a thorough exam.  . Visit your dentist for a routine exam and preventive care every 6 months. Brush your teeth twice a day and floss once a day. Good oral hygiene prevents tooth decay and gum disease.  . The frequency of eye exams is based on your age, health, family medical history, use  of contact lenses, and other factors. Follow your health care provider's ecommendations for frequency of eye exams.  . Eat a healthy diet. Foods like vegetables, fruits, whole grains, low-fat dairy products, and lean protein foods contain the nutrients you need without too many calories. Decrease your intake of foods high in solid fats, added sugars, and salt. Eat the right amount of calories for you. Get information about a proper diet from your health care provider, if necessary.  . Regular physical exercise is one of the  most important things you can do for your health. Most adults should get at least 150 minutes of moderate-intensity exercise (any activity that increases your heart rate and causes you to sweat) each week. In addition, most adults need muscle-strengthening exercises on 2 or more days a week.  Silver Sneakers may be a benefit available to you. To determine eligibility, you may visit the website: www.silversneakers.com or contact program at [phone] Mon-Fri between 8AM-8PM.   . Maintain a healthy weight. The body mass index (BMI) is a screening tool to identify possible weight problems. It provides an estimate of body fat based on height and weight. Your health care provider can find your BMI and can help you achieve or maintain a healthy weight.   For adults 20 years and older: ? A BMI below 18.5 is considered underweight. ? A BMI of 18.5 to 24.9 is normal. ? A BMI of 25 to 29.9 is considered overweight. ? A BMI of 30 and above is considered obese.   . Maintain normal blood lipids and cholesterol levels by exercising and minimizing your intake of saturated fat. Eat a balanced diet with plenty of fruit and vegetables. Blood tests for lipids and cholesterol should begin at age 49 and be repeated every 5 years. If your lipid or cholesterol levels are high, you are over 50, or you are at high risk for heart disease, you may need  your cholesterol levels checked more frequently. Ongoing high lipid and cholesterol levels should be treated with medicines if diet and exercise are not working.  . If you smoke, find out from your health care provider how to quit. If you do not use tobacco, please do not start.  . If you choose to drink alcohol, please do not consume more than 2 drinks per day. One drink is considered to be 12 ounces (355 mL) of beer, 5 ounces (148 mL) of wine, or 1.5 ounces (44 mL) of liquor.  . If you are 77-61 years old, ask your health care provider if you should take aspirin to  prevent strokes.  . Use sunscreen. Apply sunscreen liberally and repeatedly throughout the day. You should seek shade when your shadow is shorter than you. Protect yourself by wearing long sleeves, pants, a wide-brimmed hat, and sunglasses year round, whenever you are outdoors.  . Once a month, do a whole body skin exam, using a mirror to look at the skin on your back. Tell your health care provider of new moles, moles that have irregular borders, moles that are larger than a pencil eraser, or moles that have changed in shape or color.

## 2016-09-12 ENCOUNTER — Other Ambulatory Visit: Payer: Self-pay

## 2016-09-12 NOTE — Progress Notes (Signed)
I reviewed health advisor's note, was available for consultation, and agree with documentation and plan.  

## 2016-09-15 ENCOUNTER — Encounter: Payer: Self-pay | Admitting: Family Medicine

## 2016-09-15 ENCOUNTER — Ambulatory Visit (INDEPENDENT_AMBULATORY_CARE_PROVIDER_SITE_OTHER): Payer: Medicare Other | Admitting: Family Medicine

## 2016-09-15 VITALS — BP 164/94 | HR 61 | Temp 98.1°F | Ht 67.0 in | Wt 153.2 lb

## 2016-09-15 DIAGNOSIS — I499 Cardiac arrhythmia, unspecified: Secondary | ICD-10-CM

## 2016-09-15 DIAGNOSIS — D649 Anemia, unspecified: Secondary | ICD-10-CM | POA: Diagnosis not present

## 2016-09-15 DIAGNOSIS — I739 Peripheral vascular disease, unspecified: Secondary | ICD-10-CM

## 2016-09-15 DIAGNOSIS — R5383 Other fatigue: Secondary | ICD-10-CM

## 2016-09-15 DIAGNOSIS — I951 Orthostatic hypotension: Secondary | ICD-10-CM | POA: Diagnosis not present

## 2016-09-15 DIAGNOSIS — G459 Transient cerebral ischemic attack, unspecified: Secondary | ICD-10-CM

## 2016-09-15 DIAGNOSIS — E46 Unspecified protein-calorie malnutrition: Secondary | ICD-10-CM | POA: Insufficient documentation

## 2016-09-15 NOTE — Telephone Encounter (Signed)
Adequate hydration and heat avoidance are encouraged. 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  I worry that given his advanced age that this may be the source of his symptoms.  Follow-up with PCP No new CV recs at this time.

## 2016-09-15 NOTE — Assessment & Plan Note (Signed)
Ongoing mild anemia. Will continue to monitor. Encouraged iron rich foods.

## 2016-09-15 NOTE — Assessment & Plan Note (Signed)
holter last year showed frequent PACs/PVCs

## 2016-09-15 NOTE — Progress Notes (Signed)
BP (!) 164/94   Pulse 61   Temp 98.1 F (36.7 C) (Oral)   Ht 5\' 7"  (1.702 m)   Wt 153 lb 4 oz (69.5 kg)   SpO2 97%   BMI 24.00 kg/m   On repeat testing, 180/80  CC: AMW f/u visit Subjective:    Patient ID: Adam Clements, male    DOB: Jun 02, 1934, 82 y.o.   MRN: [id number]  HPI: Adam Clements is a 82 y.o. male presenting on 09/15/2016 for Annual Exam (Medicare pt 2) and Fatigue   Saw Katha Cabal last week for medicare wellness visit. Note reviewed. Ongoing low stamina, lightheadedness for the past 3 years. Interested in shingrix. See prior note for details. Known orthostasis currently on florinef 0.1mg  bid. Also on pletal and aggrenox. He has seen Dr Lauris Chroman. Fludrocortisone was increased to BID due to increased lightheadedness 2 wks ago.   He doesn't believe that age affects him. Not satisfied with how he feels. Doesn't have stamina to complete barn chores in the morning. Blood work showing mild anemia with low total protein and borderline albumin levels. He endorses eating 3 meals a day.   Preventative: Colonoscopy - 2006 by Dr. Amedeo Plenty - R and L sided diverticulosis. Not interested in repeat at this time. Declines cologuard. Will age out. Saw GI 2015 2/2 weight loss and IDA. Decided to do yearly hemoccults until next colonoscopy 2016, even slow release iron caused stomach upset.  Prostate screening - normal PSA in the past. H/o BPH. Age out.  DEXA 2004 WNL.  Flu shot - yearly Pneumovax 2004, prevnar 2015 Td 2012  zostavax 2009  shingrix - discussed Advanced directives: has living will at home -done through attorney. Wants HCPOA to be wife then son and then oldest daughter etc. Advanced directives in chart (10/2012). Does not want artificial nutrition/hydration or prolonged life support Seat belt use discussed.  Sunscreen use discussed. No changing moles on skin. Sees derm regularly.  Non smoker Alcohol - 1 glass wine/day  Lives with wife [name]).  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 Interim medical history since our last visit reviewed. Allergies and medications reviewed and updated. Outpatient Medications Prior to Visit  Medication Sig Dispense Refill  . Blood Pressure Monitoring (BLOOD PRESSURE MONITOR AUTOMAT) DEVI 1 Units by Does not apply route as needed. 1 Device 0  . cholecalciferol (VITAMIN D) 1000 units tablet Take 1,000 Units by mouth daily.    Marland Kitchen dipyridamole-aspirin (AGGRENOX) 200-25 MG 12hr capsule take 1 capsule by mouth twice a day 60 capsule 11  . pravastatin (PRAVACHOL) 20 MG tablet take 1 tablet by mouth once daily 30 tablet 6  . vitamin B-12 (CYANOCOBALAMIN) 500 MCG tablet Take 500 mcg by mouth daily.    . cilostazol (PLETAL) 50 MG tablet take 1 tablet by mouth twice a day 60 tablet 5  . fludrocortisone (FLORINEF) 0.1 MG tablet take 1 tablet by mouth twice a day 180 tablet 1   No facility-administered medications prior to visit.      Per HPI unless specifically indicated in ROS section below Review of Systems     Objective:    BP (!) 164/94   Pulse 61   Temp 98.1 F (36.7 C) (Oral)   Ht 5\' 7"  (1.702  m)   Wt 153 lb 4 oz (69.5 kg)   SpO2 97%   BMI 24.00 kg/m   Wt Readings from Last 3 Encounters:  09/15/16 153 lb 4 oz (69.5 kg)  09/11/16 149 lb (67.6 kg)  07/21/16 154 lb (69.9 kg)    Physical Exam  Constitutional: He is oriented to person, place, and time. He appears well-developed and well-nourished. No distress.  HENT:  Head: Normocephalic and atraumatic.  Right Ear: External ear normal. Decreased hearing is noted.  Left Ear: External ear normal. Decreased hearing is noted.  Nose: Nose normal.  Mouth/Throat: Uvula is midline, oropharynx is clear and  moist and mucous membranes are normal. No oropharyngeal exudate, posterior oropharyngeal edema or posterior oropharyngeal erythema.  Hearing aides in place  Eyes: Conjunctivae and EOM are normal. Pupils are equal, round, and reactive to light. No scleral icterus.  Neck: Normal range of motion. Neck supple. Carotid bruit is not present. No thyromegaly present.  Cardiovascular: Normal heart sounds and intact distal pulses.  An irregular rhythm present. Bradycardia present.   No murmur heard. Pulses:      Radial pulses are 2+ on the right side, and 2+ on the left side.  Pulmonary/Chest: Effort normal and breath sounds normal. No respiratory distress. He has no wheezes. He has no rales.  Abdominal: Soft. Bowel sounds are normal. He exhibits no distension and no mass. There is no tenderness. There is no rebound and no guarding.  Musculoskeletal: Normal range of motion. He exhibits no edema.  Lymphadenopathy:    He has no cervical adenopathy.  Neurological: He is alert and oriented to person, place, and time.  CN grossly intact, station and gait intact  Skin: Skin is warm and dry. No rash noted.  Psychiatric: He has a normal mood and affect. His behavior is normal. Judgment and thought content normal.  Nursing note and vitals reviewed.  Results for orders placed or performed in visit on 09/11/16  Lipid panel  Result Value Ref Range   Cholesterol 143 0 - 200 mg/dL   Triglycerides 44.0 0.0 - 149.0 mg/dL   HDL 63.50 >39.00 mg/dL   VLDL 8.8 0.0 - 40.0 mg/dL   LDL Cholesterol 71 0 - 99 mg/dL   Total CHOL/HDL Ratio 2    NonHDL 79.40   Comprehensive metabolic panel  Result Value Ref Range   Sodium 139 135 - 145 mEq/L   Potassium 3.7 3.5 - 5.1 mEq/L   Chloride 105 96 - 112 mEq/L   CO2 28 19 - 32 mEq/L   Glucose, Bld 96 70 - 99 mg/dL   BUN 23 6 - 23 mg/dL   Creatinine, Ser 1.17 0.40 - 1.50 mg/dL   Total Bilirubin 0.6 0.2 - 1.2 mg/dL   Alkaline Phosphatase 40 39 - 117 U/L   AST 15 0 - 37 U/L     ALT 12 0 - 53 U/L   Total Protein 5.6 (L) 6.0 - 8.3 g/dL   Albumin 3.5 3.5 - 5.2 g/dL   Calcium 8.9 8.4 - 10.5 mg/dL   GFR 63.69 >60.00 mL/min  TSH  Result Value Ref Range   TSH 1.89 0.35 - 4.50 uIU/mL  CBC with Differential/Platelet  Result Value Ref Range   WBC 4.9 4.0 - 10.5 K/uL   RBC 3.46 (L) 4.22 - 5.81 Mil/uL   Hemoglobin 11.5 (L) 13.0 - 17.0 g/dL   HCT 33.4 (L) 39.0 - 52.0 %   MCV 96.6 78.0 - 100.0 fl   MCHC 34.4  30.0 - 36.0 g/dL   RDW 12.8 11.5 - 15.5 %   Platelets 210.0 150.0 - 400.0 K/uL   Neutrophils Relative % 63.0 43.0 - 77.0 %   Lymphocytes Relative 21.9 12.0 - 46.0 %   Monocytes Relative 11.2 3.0 - 12.0 %   Eosinophils Relative 3.0 0.0 - 5.0 %   Basophils Relative 0.9 0.0 - 3.0 %   Neutro Abs 3.1 1.4 - 7.7 K/uL   Lymphs Abs 1.1 0.7 - 4.0 K/uL   Monocytes Absolute 0.5 0.1 - 1.0 K/uL   Eosinophils Absolute 0.1 0.0 - 0.7 K/uL   Basophils Absolute 0.0 0.0 - 0.1 K/uL  Uric acid  Result Value Ref Range   Uric Acid, Serum 4.7 4.0 - 7.8 mg/dL  VITAMIN D 25 Hydroxy (Vit-D Deficiency, Fractures)  Result Value Ref Range   VITD 43.28 30.00 - 100.00 ng/mL      Assessment & Plan:   Problem List Items Addressed This Visit    Anemia    Ongoing mild anemia. Will continue to monitor. Encouraged iron rich foods.       Decreased stamina - Primary    Predominant concern. I think his decreased stamina comes from orthostasis as well as age changes - extensively discussed this, encouraged he not overexert himself.       Irregular heart rate    holter last year showed frequent PACs/PVCs      Orthostatic hypotension    He already wears compression stockings regularly. Known PAD - but will decrease pletal to QD dosing to see if orthostasis improves.  Given hypertension noted, recommended he decrease florinef to 0.1mg  qd dosing.       PAD (peripheral artery disease) (HCC)    Known PAD - but will decrease pletal to QD dosing to see if orthostasis improves.        Protein malnutrition (HCC)    Total protein levels remain low, albumin borderline. Discussed with patient, encouraged regular protein intake with each meal - he endorses 3 meals a day. I also suggested he start ensure or boost supplement daily for 1-2 wk trial to see if any improvement in stamina.       Syncope due to orthostatic hypotension    No episodes in over a year.       TIA (transient ischemic attack)    H/o this. Recommended continue aggrenox and pravastatin.          Follow up plan: Return in about 3 months (around 12/16/2016) for follow up visit.  Ria Bush, MD

## 2016-09-15 NOTE — Assessment & Plan Note (Signed)
H/o this. Recommended continue aggrenox and pravastatin.

## 2016-09-15 NOTE — Assessment & Plan Note (Signed)
No episodes in over a year. 

## 2016-09-15 NOTE — Patient Instructions (Addendum)
Check with pharmacy about the new 2 shot shingles series (shingrix).  I want you to make sure you are including protein with every meal. Consider buying ensure or boost and taking daily for 1-2 weeks to see if any effect on stamina/energy levels.  Try decreasing pletal to only 1 in evening to see if any improvement in lightheadedness.  Decrease fludrocortisone to 1 a day.  Continue other medicines.  Schedule follow up with Dr Jackalyn Lombard office.  Return to see me in 3-4 months for follow up.

## 2016-09-15 NOTE — Assessment & Plan Note (Signed)
Predominant concern. I think his decreased stamina comes from orthostasis as well as age changes - extensively discussed this, encouraged he not overexert himself.

## 2016-09-15 NOTE — Assessment & Plan Note (Signed)
Known PAD - but will decrease pletal to QD dosing to see if orthostasis improves.

## 2016-09-15 NOTE — Assessment & Plan Note (Signed)
Total protein levels remain low, albumin borderline. Discussed with patient, encouraged regular protein intake with each meal - he endorses 3 meals a day. I also suggested he start ensure or boost supplement daily for 1-2 wk trial to see if any improvement in stamina.

## 2016-09-15 NOTE — Assessment & Plan Note (Signed)
He already wears compression stockings regularly. Known PAD - but will decrease pletal to QD dosing to see if orthostasis improves.  Given hypertension noted, recommended he decrease florinef to 0.1mg  qd dosing.

## 2016-09-18 NOTE — Telephone Encounter (Signed)
Returned call to patient and let him know Dr Jackalyn Lombard recommendations.  He went to see his PCP on Monday and is feeling better.  Some of his medications were reduced and his energy level is coming back.  He will follow up with Dr Rayann Heman in September

## 2016-11-11 ENCOUNTER — Telehealth: Payer: Self-pay | Admitting: Family Medicine

## 2016-11-11 NOTE — Telephone Encounter (Signed)
Patient has an appointment scheduled tomorrow 11/12/16 with Dr. Deborra Medina. Patient stated that this is not an acute issue and has been going on for years. Advised patient that if symptoms get worse before appointment tomorrow to go to the ER and he agreed.

## 2016-11-11 NOTE — Telephone Encounter (Signed)
Patient Name: Adam Clements  DOB: Apr 14, 1934    Initial Comment Dede Query, light headed, passing out at times, it has gotten worse lately, especially in the morning. He thinks it may be due to low blood pressure   Nurse Assessment  Nurse: Julien Girt, RN, Almyra Free Date/Time Eilene Ghazi Time): 11/11/2016 9:45:21 AM  Confirm and document reason for call. If symptomatic, describe symptoms. ---Caller states he was lightheaded, has a hx of passing out and thinks it might be due to his BP. Current BP is 129/61. States this happens every morning, some days are worse like today. He is feeling better now. Adds his BP has been as low as 90/64, the same time he feels dizzy, so he just stopped taking it . After he is up and about the dizziness subsides.  Does the patient have any new or worsening symptoms? ---Yes  Will a triage be completed? ---Yes  Related visit to physician within the last 2 weeks? ---No  Does the PT have any chronic conditions? (i.e. diabetes, asthma, etc.) ---Yes  List chronic conditions. ---Htn, Hx TIA, High Cholesterol, Hx dizziness, Hx syncope  Is this a behavioral health or substance abuse call? ---No     Guidelines    Guideline Title Affirmed Question Affirmed Notes  Low Blood Pressure [4] Systolic BP [id number] AND [1] taking blood pressure medications AND [3] NOT dizzy, lightheaded or weak    Final Disposition User   See Physician within [place], RN, Almyra Free    Referrals  REFERRED TO PCP OFFICE   Disagree/Comply: Leta Baptist

## 2016-11-12 ENCOUNTER — Ambulatory Visit (INDEPENDENT_AMBULATORY_CARE_PROVIDER_SITE_OTHER): Payer: Medicare Other | Admitting: Family Medicine

## 2016-11-12 ENCOUNTER — Encounter: Payer: Self-pay | Admitting: Family Medicine

## 2016-11-12 ENCOUNTER — Ambulatory Visit: Payer: Medicare Other | Admitting: Family Medicine

## 2016-11-12 DIAGNOSIS — I951 Orthostatic hypotension: Secondary | ICD-10-CM

## 2016-11-12 NOTE — Progress Notes (Signed)
Lightheaded.  Today was a good day.  He has variable symptoms.   The day prior to yesterday was worse.  On a bad day, he'll have to stoop over with this head between his knees due to lightheadedness.  He'll feel weak and lightheaded episodically.  Some days are better than others.  Mornings are usually worse than afternoons.   Standing up too quickly may or may not cause any symptoms for patient.    Already made several changes:  He already wears compression stockings regularly. Prev with decrease in pletal to QD dosing to see if that helped with orthostasis.  Given prev hypertension noted, recommended he decrease florinef to 0.1mg  qd dosing.   H/o CHF noted.   Meds, vitals, and allergies reviewed.   ROS: Per HPI unless specifically indicated in ROS section   GEN: nad, alert and oriented HEENT: mucous membranes moist NECK: supple w/o LA CV: rrr. PULM: ctab, no inc wob ABD: soft, +bs EXT: no edema, in compression stockings.

## 2016-11-12 NOTE — Patient Instructions (Signed)
Make a point to drink more water especially on the days when working outside.  Take the florinef early in the AM and see if that helps in the meantime.  Let me talk to Dr. Darnell Level in the meantime.  Take care.  Glad to see you.

## 2016-11-13 IMAGING — CT CT HEAD W/O CM
2 series · 16 of 30 positions shown, 20 images · non-contrast
Comparison: MRI brain dated 11/11/2013

CLINICAL DATA: TIA symptoms, visual changes, confusion, slurred
speech, dizziness

EXAM:
CT HEAD WITHOUT CONTRAST
TECHNIQUE: Contiguous axial images were obtained from the base of the skull
through the vertex without intravenous contrast.

[Series 3: head bone · axial · 0.49mm/px · z∈[+4,+54]mm · 3 of 32 slices shown]
[im 3/32  bone]
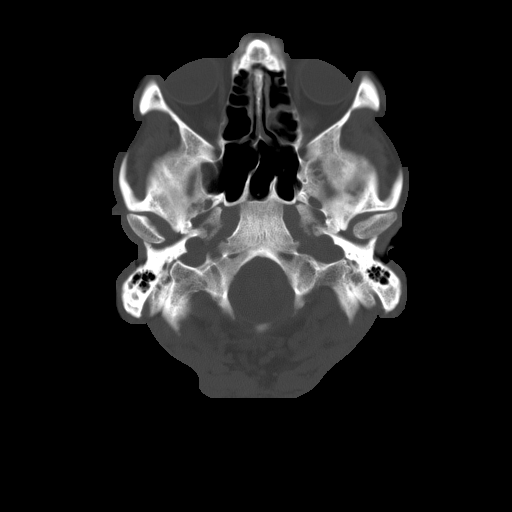
[im 7/32  bone]
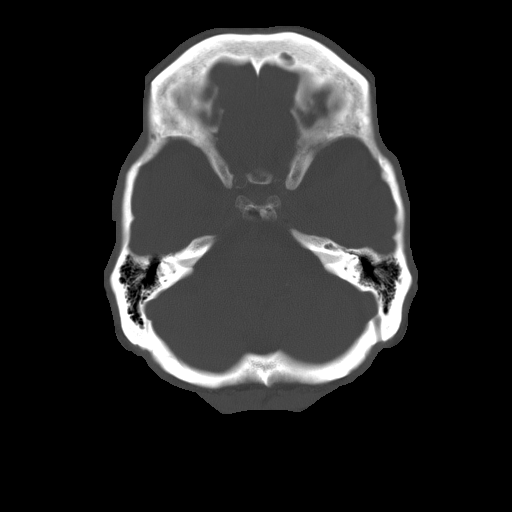
[im 12/32  bone]
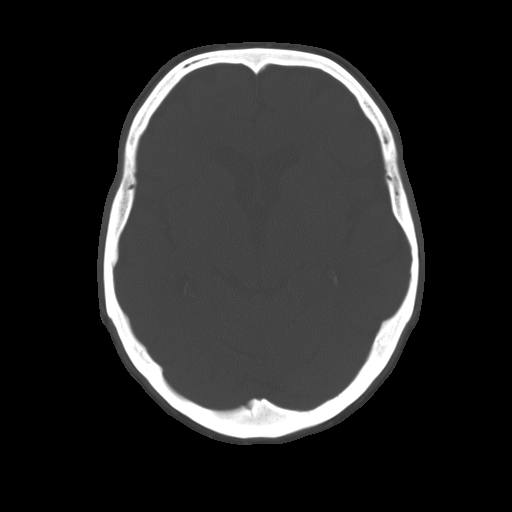

[Series 32: 3d filtered head w/o · axial · non-contrast · 0.49mm/px · z∈[+4,+147]mm · 13 of 32 slices shown, 17 images]
[im 3/32  brain]
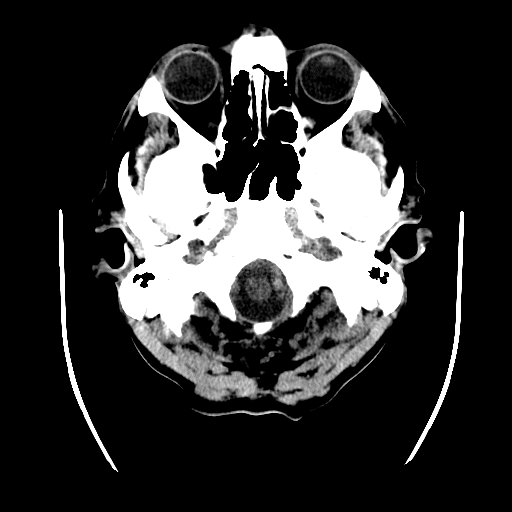
[im 3/32  bone]
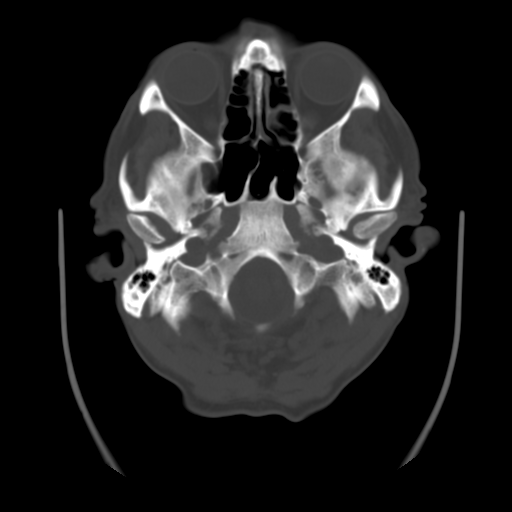
[im 5/32  brain]
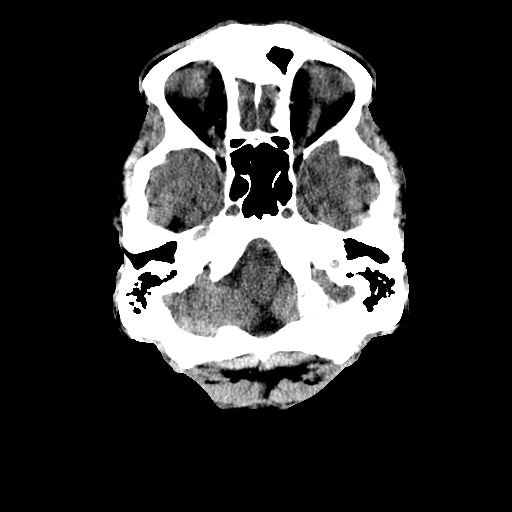
[im 7/32  brain]
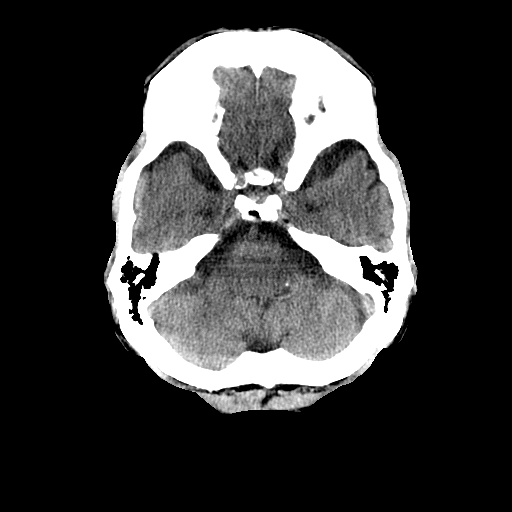
[im 9/32  brain]
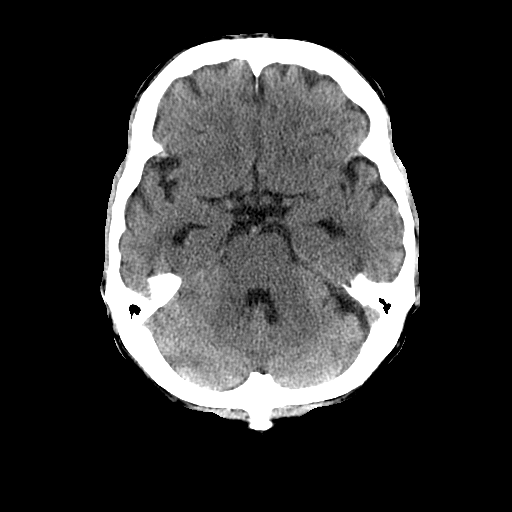
[im 12/32  brain]
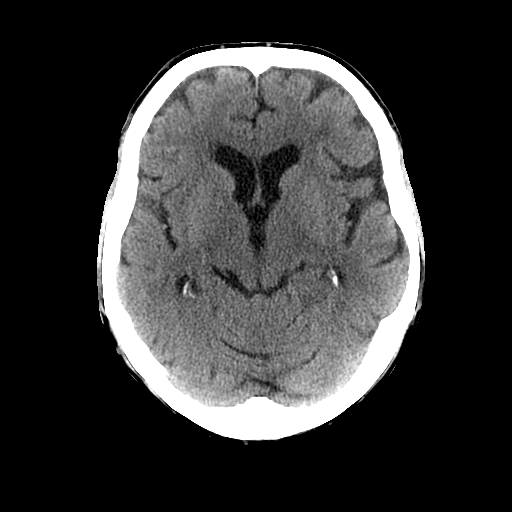
[im 12/32  bone]
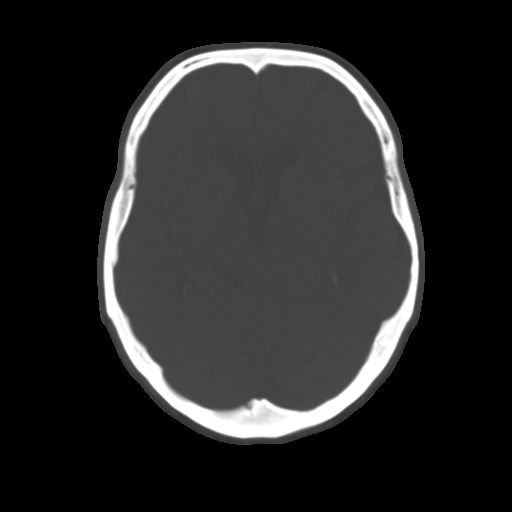
[im 14/32  brain]
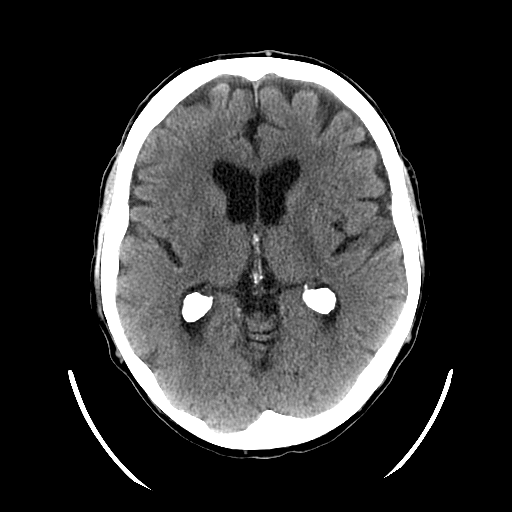
[im 16/32  brain]
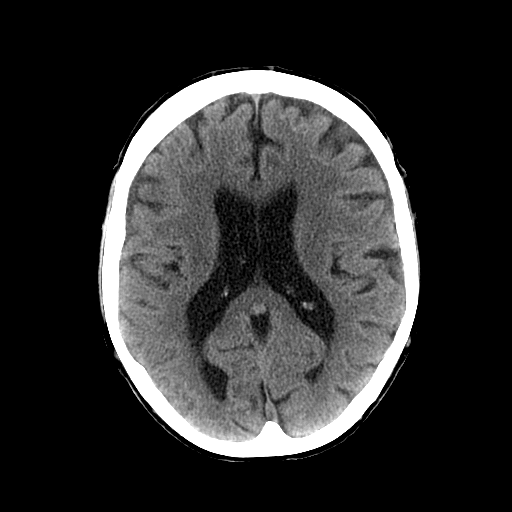
[im 18/32  brain]
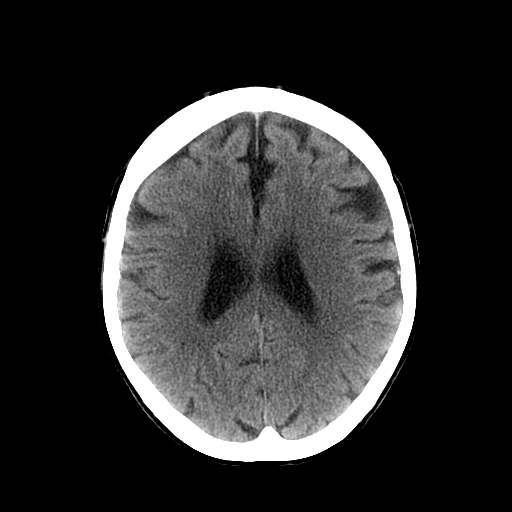
[im 20/32  brain]
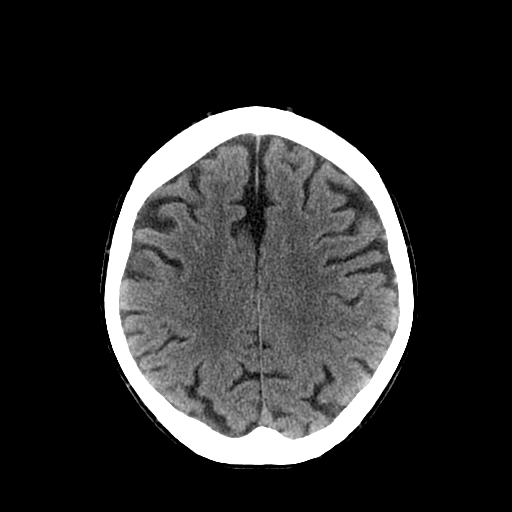
[im 20/32  bone]
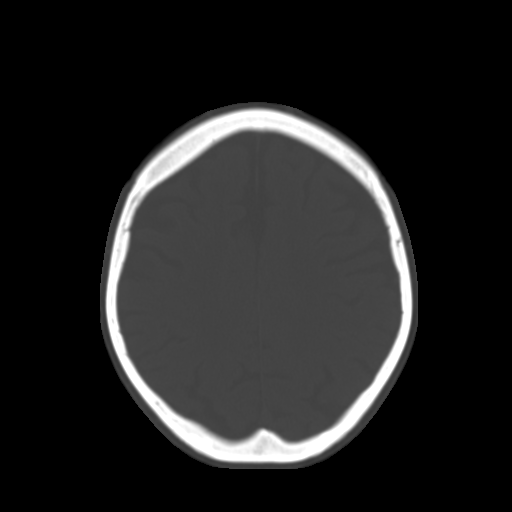
[im 23/32  brain]
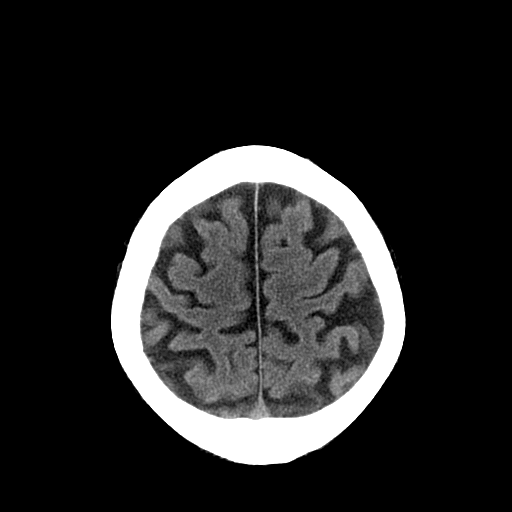
[im 25/32  brain]
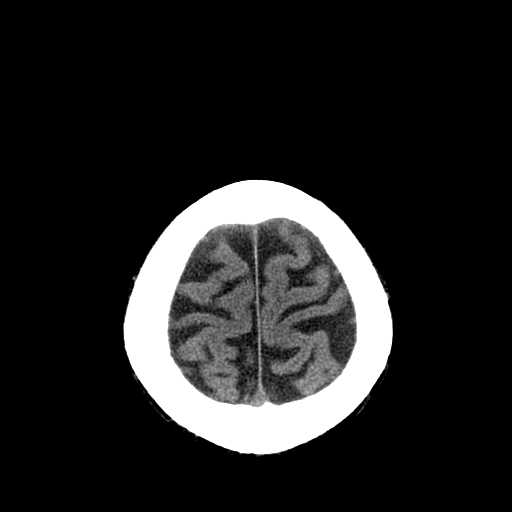
[im 27/32  brain]
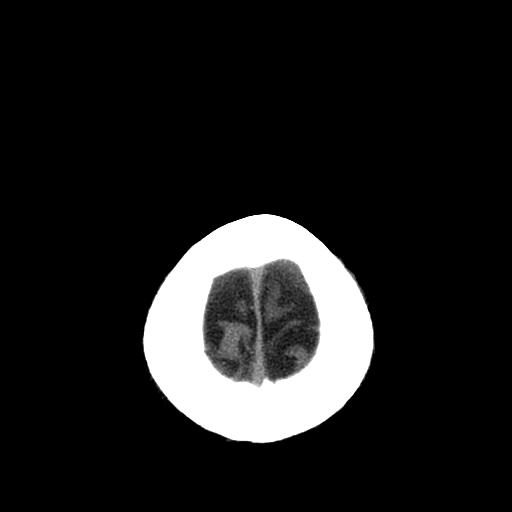
[im 29/32  brain]
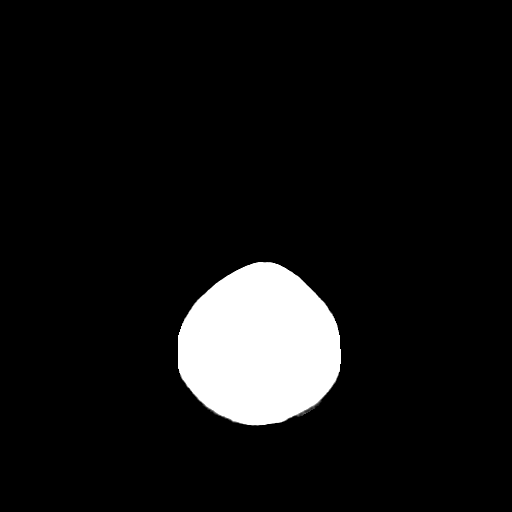
[im 29/32  bone]
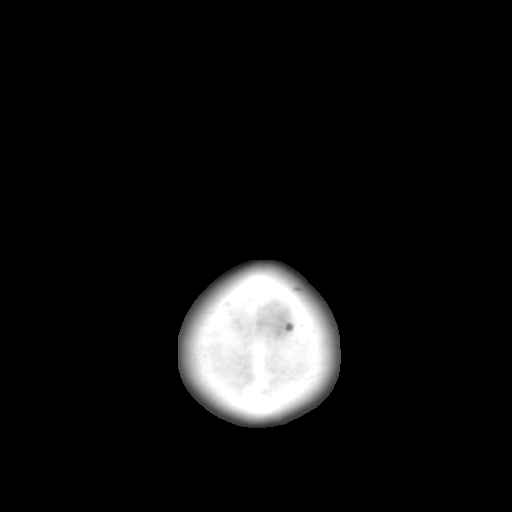

[16 of 30 positions shown; findings below may reference images not displayed]

FINDINGS: No evidence of parenchymal hemorrhage or extra-axial fluid
collection. No mass lesion, mass effect, or midline shift.

No CT evidence of acute infarction.

Subcortical white matter and periventricular small vessel ischemic
changes. Mild intracranial atherosclerosis.

Global cortical atrophy.  Mild secondary ventricular prominence.

The visualized paranasal sinuses are essentially clear. The mastoid
air cells are unopacified.

No evidence of calvarial fracture.
IMPRESSION: No evidence of acute intracranial abnormality.

Atrophy with small vessel ischemic changes.

## 2016-11-13 NOTE — Assessment & Plan Note (Signed)
He normally gets up about 6 AM, takes fludrocortisone about 7:30 AM. On a "bad day", he will have fatigue and lightheadedness throughout the morning. A few hours after his Florinef dose, his symptoms will improve. His symptoms seem to be worse the day after he has heavy exertion, as with working at a [employer] site.  I want to make his few changes as possible initially. I would not increase his Florinef but I would have him take it early in the morning, as soon as he wakes up. Increase fluid intake in the meantime, especially on the days where he is working outside. I want him to update [place] after these changes. It may be that he needs a half dose of Florinef at night, but I did not add that on yet. Routed to PCP for input.

## 2016-11-28 ENCOUNTER — Telehealth: Payer: Self-pay

## 2016-11-28 DIAGNOSIS — Z23 Encounter for immunization: Secondary | ICD-10-CM | POA: Diagnosis not present

## 2016-11-28 NOTE — Telephone Encounter (Signed)
Pt is at [facility] and wants to know if has had the shingrix vaccination; per pts immunization list he has not had the shingrix vaccine. Pt said nothing further needed.

## 2016-11-30 NOTE — Telephone Encounter (Signed)
Can we call patient - saw Dr Damita Dunnings last month with recommendation to take florinef earlier in the morning to help with his orthostatic hypotension. Did this help? If not ,next step was to consider taking 1/2 florinef tablet at bedtime. Would he like to try this?

## 2016-12-01 NOTE — Telephone Encounter (Signed)
Spoke with pt, relayed message per Dr. Darnell Level. Pt says taking the florinef is helping a little. However, he says his head is a little "swimmy" in the morning but is better by the early afternoon. Instructed pt per Dr. Darnell Level to add 1/2 florinef tablet before bedtime. Says he will try this for a week then call back to let Dr. Darnell Level know how he is doing.

## 2016-12-15 ENCOUNTER — Ambulatory Visit (INDEPENDENT_AMBULATORY_CARE_PROVIDER_SITE_OTHER): Payer: Medicare Other | Admitting: Family Medicine

## 2016-12-15 ENCOUNTER — Encounter: Payer: Self-pay | Admitting: Family Medicine

## 2016-12-15 VITALS — BP 108/58 | HR 70 | Temp 97.9°F | Wt 147.8 lb

## 2016-12-15 DIAGNOSIS — G459 Transient cerebral ischemic attack, unspecified: Secondary | ICD-10-CM | POA: Diagnosis not present

## 2016-12-15 DIAGNOSIS — R634 Abnormal weight loss: Secondary | ICD-10-CM

## 2016-12-15 DIAGNOSIS — I951 Orthostatic hypotension: Secondary | ICD-10-CM

## 2016-12-15 DIAGNOSIS — I739 Peripheral vascular disease, unspecified: Secondary | ICD-10-CM

## 2016-12-15 NOTE — Progress Notes (Signed)
BP (!) 108/58 (BP Location: Left Arm, Patient Position: Sitting, Cuff Size: Normal)   Pulse 70   Temp 97.9 F (36.6 C) (Oral)   Wt 147 lb 12 oz (67 kg)   SpO2 97%   BMI 23.14 kg/m    CC: 3 mo f/u visit Subjective:    Patient ID: Adam Clements, male    DOB: 1934/07/09, 82 y.o.   MRN: [id number]  HPI: GEROLD SAR is a 82 y.o. male presenting on 12/15/2016 for 3 mo follow-up   Wife has been going through breast cancer treatment - had surgery and she decided to forego chemotherapy.   See recent notes for details.  Known orthostatic hypotension treated with florinef 0.1mg  1 tab in am and 1/2 tab in pm. This has helped.   He continues working with [employer]. He continues to work on [employer].   Weight loss noted. He does use nutritional supplement 2-3 times weekly.   Relevant past medical, surgical, family and social history reviewed and updated as indicated. Interim medical history since our last visit reviewed. Allergies and medications reviewed and updated. Outpatient Medications Prior to Visit  Medication Sig Dispense Refill  . Blood Pressure Monitoring (BLOOD PRESSURE MONITOR AUTOMAT) DEVI 1 Units by Does not apply route as needed. 1 Device 0  . cholecalciferol (VITAMIN D) 1000 units tablet Take 1,000 Units by mouth daily.    . cilostazol (PLETAL) 50 MG tablet Take 1 tablet (50 mg total) by mouth daily.    Marland Kitchen dipyridamole-aspirin (AGGRENOX) 200-25 MG 12hr capsule take 1 capsule by mouth twice a day 60 capsule 11  . fludrocortisone (FLORINEF) 0.1 MG tablet Take 1 tablet (100 mcg) in the morning and 1/2 tablet (50 mcg) by mouth at bedtime.    . pravastatin (PRAVACHOL) 20 MG tablet take 1 tablet by mouth once daily 30 tablet 6  . vitamin B-12 (CYANOCOBALAMIN) 500 MCG tablet Take 500 mcg by mouth daily.     No facility-administered medications prior to visit.      Per HPI unless specifically indicated in ROS section below Review of Systems     Objective:    BP (!) 108/58 (BP Location: Left Arm, Patient Position: Sitting, Cuff Size: Normal)   Pulse 70   Temp 97.9 F (36.6 C) (Oral)   Wt 147 lb 12 oz (67 kg)   SpO2 97%   BMI 23.14 kg/m   Wt Readings from Last 3 Encounters:  12/15/16 147 lb 12 oz (67 kg)  11/12/16 149 lb (67.6 kg)  09/15/16 153 lb 4 oz (69.5 kg)    Physical Exam  Constitutional: He appears well-developed and well-nourished. No distress.  HENT:  Mouth/Throat: Oropharynx is clear and moist. No oropharyngeal exudate.  Cardiovascular: Normal rate, regular rhythm, normal heart sounds and intact distal pulses.   No murmur heard. Pulmonary/Chest: Effort normal and breath sounds normal. No respiratory distress. He has no wheezes. He has no rales.  Musculoskeletal: He exhibits no edema.  Skin: Skin is warm and dry. No rash noted.  Psychiatric: He has a normal mood and affect.  Nursing note and vitals reviewed.  Results for orders placed or performed in visit on 09/11/16  Lipid panel  Result Value Ref Range   Cholesterol 143 0 - 200 mg/dL   Triglycerides 44.0 0.0 - 149.0 mg/dL   HDL 63.50 >39.00 mg/dL   VLDL 8.8 0.0 - 40.0 mg/dL   LDL Cholesterol 71 0 - 99 mg/dL   Total CHOL/HDL Ratio  2    NonHDL 79.40   Comprehensive metabolic panel  Result Value Ref Range   Sodium 139 135 - 145 mEq/L   Potassium 3.7 3.5 - 5.1 mEq/L   Chloride 105 96 - 112 mEq/L   CO2 28 19 - 32 mEq/L   Glucose, Bld 96 70 - 99 mg/dL   BUN 23 6 - 23 mg/dL   Creatinine, Ser 1.17 0.40 - 1.50 mg/dL   Total Bilirubin 0.6 0.2 - 1.2 mg/dL   Alkaline Phosphatase 40 39 - 117 U/L   AST 15 0 - 37 U/L   ALT 12 0 - 53 U/L   Total Protein 5.6 (L) 6.0 - 8.3 g/dL   Albumin 3.5 3.5 - 5.2 g/dL   Calcium 8.9 8.4 - 10.5 mg/dL   GFR 63.69 >60.00 mL/min  TSH  Result Value Ref Range   TSH 1.89 0.35 - 4.50 uIU/mL  CBC with Differential/Platelet  Result Value Ref Range   WBC 4.9 4.0 - 10.5 K/uL   RBC 3.46 (L) 4.22 - 5.81 Mil/uL   Hemoglobin 11.5 (L) 13.0 - 17.0  g/dL   HCT 33.4 (L) 39.0 - 52.0 %   MCV 96.6 78.0 - 100.0 fl   MCHC 34.4 30.0 - 36.0 g/dL   RDW 12.8 11.5 - 15.5 %   Platelets 210.0 150.0 - 400.0 K/uL   Neutrophils Relative % 63.0 43.0 - 77.0 %   Lymphocytes Relative 21.9 12.0 - 46.0 %   Monocytes Relative 11.2 3.0 - 12.0 %   Eosinophils Relative 3.0 0.0 - 5.0 %   Basophils Relative 0.9 0.0 - 3.0 %   Neutro Abs 3.1 1.4 - 7.7 K/uL   Lymphs Abs 1.1 0.7 - 4.0 K/uL   Monocytes Absolute 0.5 0.1 - 1.0 K/uL   Eosinophils Absolute 0.1 0.0 - 0.7 K/uL   Basophils Absolute 0.0 0.0 - 0.1 K/uL  Uric acid  Result Value Ref Range   Uric Acid, Serum 4.7 4.0 - 7.8 mg/dL  VITAMIN D 25 Hydroxy (Vit-D Deficiency, Fractures)  Result Value Ref Range   VITD 43.28 30.00 - 100.00 ng/mL      Assessment & Plan:  Over 25 minutes were spent face-to-face with the patient during this encounter and >50% of that time was spent on counseling and coordination of care. He had many questions about his medications and reasons behind medication use as well as pathophysiology of [employer].  Problem List Items Addressed This Visit    Orthostatic hypotension - Primary    Remains frustrated with this. Reviewed florinef use.  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       PAD (peripheral artery disease) ([place])    He continues once daily pletal. Claudication worsened when he tried off this.       TIA (transient ischemic attack)    Reviewed h/o AMS and aphasia 01/2015 thought TIA related. Encouraged he continue aggrenox and statin use.       Weight loss    Another few pounds dropped. He endorses 3 meals a day with protein with each meal. I did encourage he start daily nutritional supplement with boost or ensure 30 min prior to lunch for orexigenic effect.          Follow up plan: Return in about 4 months (around 04/16/2017) for follow up visit.  Ria Bush, MD

## 2016-12-15 NOTE — Assessment & Plan Note (Signed)
Another few pounds dropped. He endorses 3 meals a day with protein with each meal. I did encourage he start daily nutritional supplement with boost or ensure 30 min prior to lunch for orexigenic effect.

## 2016-12-15 NOTE — Assessment & Plan Note (Addendum)
Remains frustrated with this. Reviewed florinef use.  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

## 2016-12-15 NOTE — Assessment & Plan Note (Signed)
He continues once daily pletal. Claudication worsened when he tried off this.

## 2016-12-15 NOTE — Assessment & Plan Note (Addendum)
Reviewed h/o AMS and aphasia 01/2015 thought TIA related. Encouraged he continue aggrenox and statin use.

## 2016-12-15 NOTE — Patient Instructions (Addendum)
Good to see you today.  Try to drink boost or ensure 30 minutes before lunch.  Continue current medicines.  Return as needed or in 4 months for follow up visit.

## 2016-12-16 ENCOUNTER — Ambulatory Visit: Payer: Self-pay | Admitting: Family Medicine

## 2016-12-17 IMAGING — CT CT HEAD W/O CM
2 series · 16 of 30 positions shown, 19 images · non-contrast
Comparison: 01/02/2015 head CT.

CLINICAL DATA: Lightheaded.  Confusion.

EXAM:
CT HEAD WITHOUT CONTRAST
TECHNIQUE: Contiguous axial images were obtained from the base of the skull
through the vertex without intravenous contrast.

[Series 2: head w/o · axial · non-contrast · 0.42mm/px · z∈[-106,+19]mm · 9 of 33 slices shown, 12 images]
[im 4/33  brain]
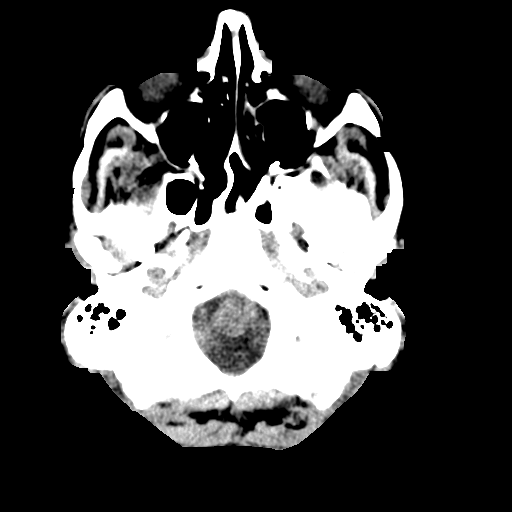
[im 4/33  bone]
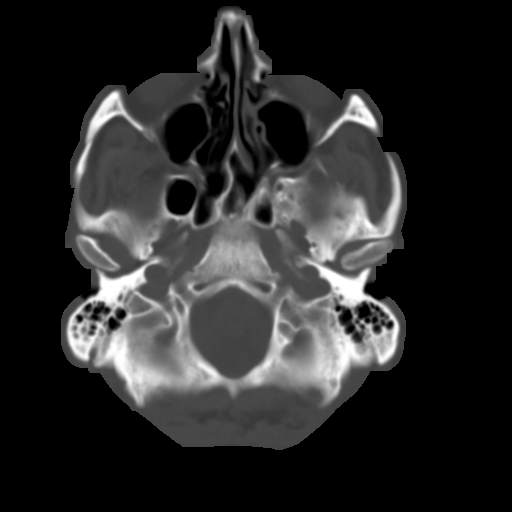
[im 7/33  brain]
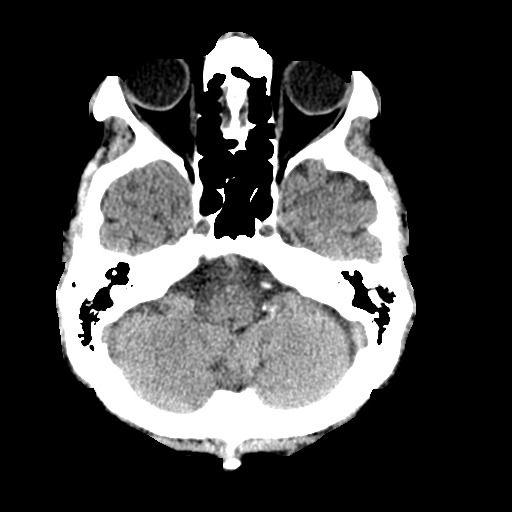
[im 10/33  brain]
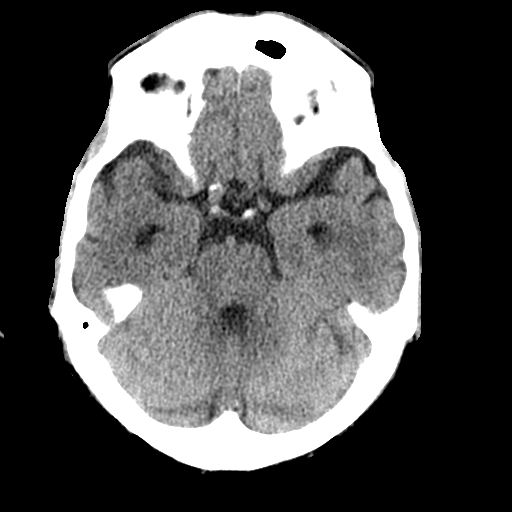
[im 13/33  brain]
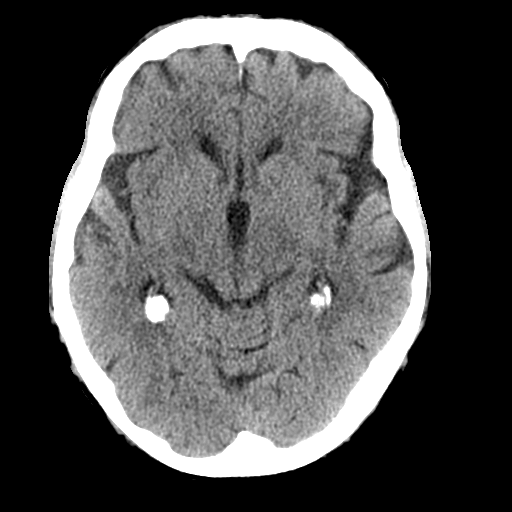
[im 17/33  brain]
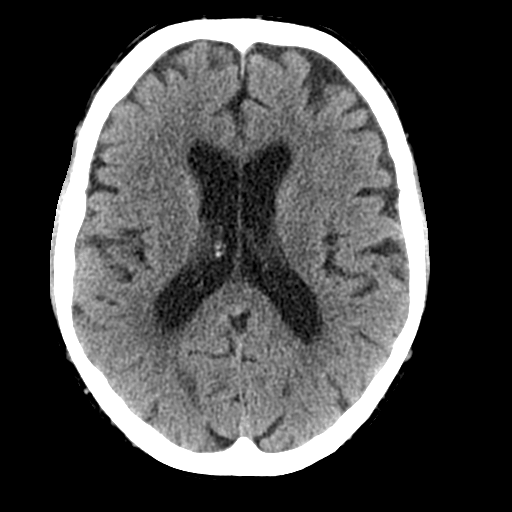
[im 17/33  bone]
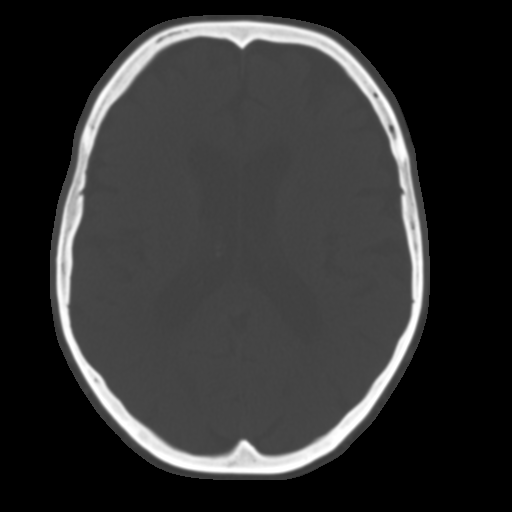
[im 20/33  brain]
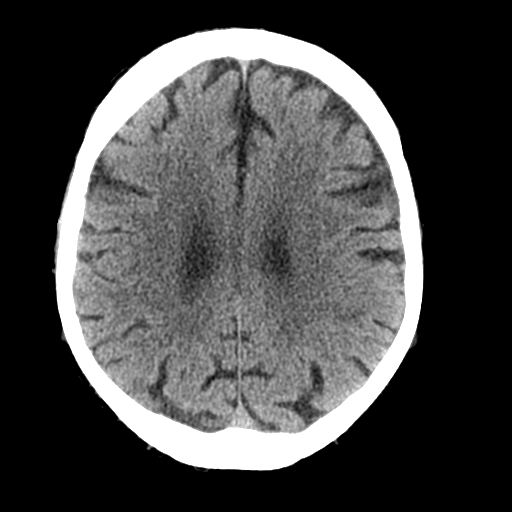
[im 23/33  brain]
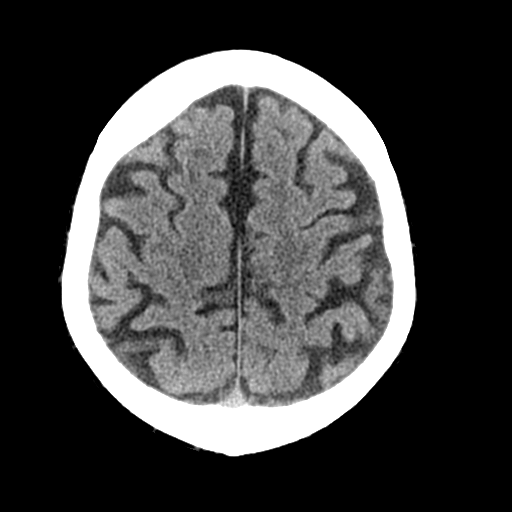
[im 26/33  brain]
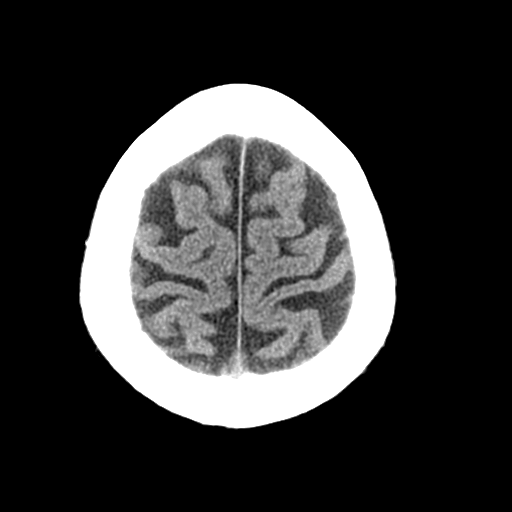
[im 29/33  brain]
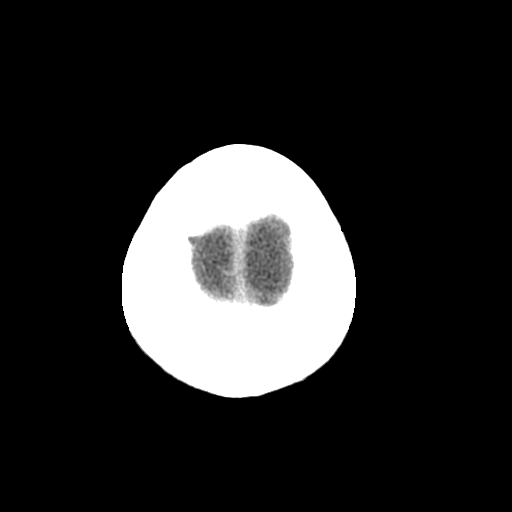
[im 29/33  bone]
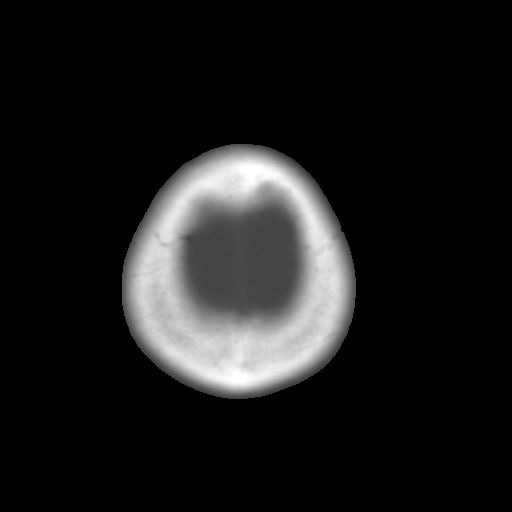

[Series 3: bone windows · axial · 0.42mm/px · z∈[-103,+5]mm · 7 of 55 slices shown]
[im 7/55  bone]
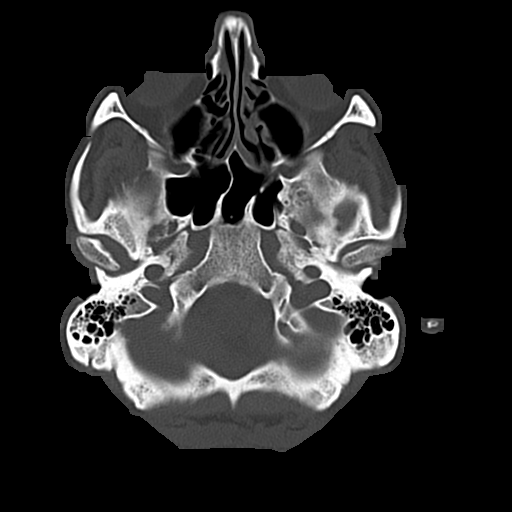
[im 13/55  bone]
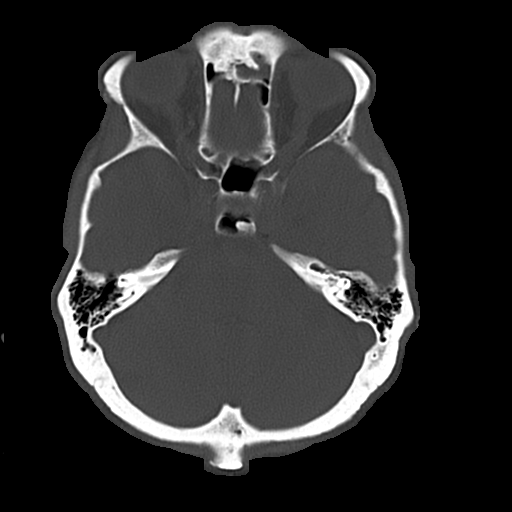
[im 19/55  bone]
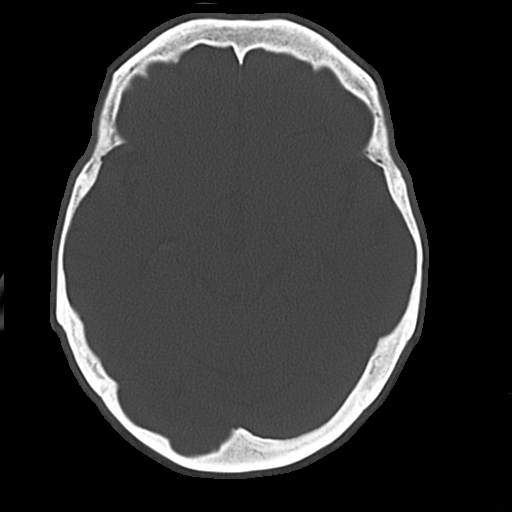
[im 25/55  bone]
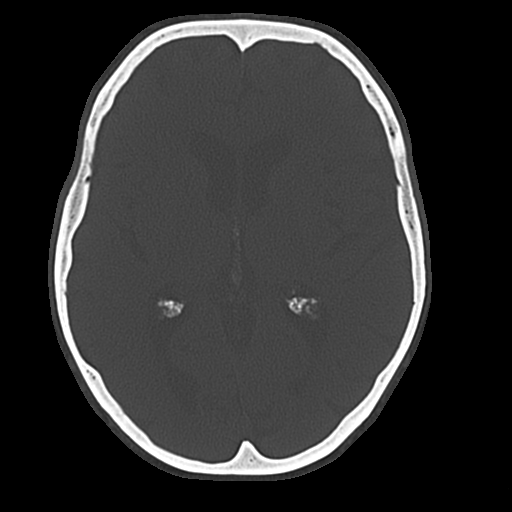
[im 31/55  bone]
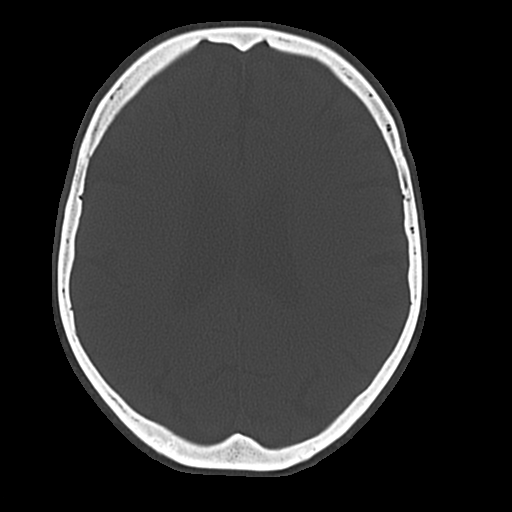
[im 37/55  bone]
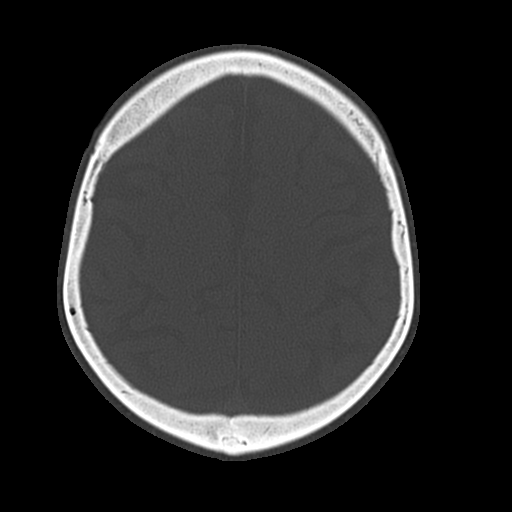
[im 43/55  bone]
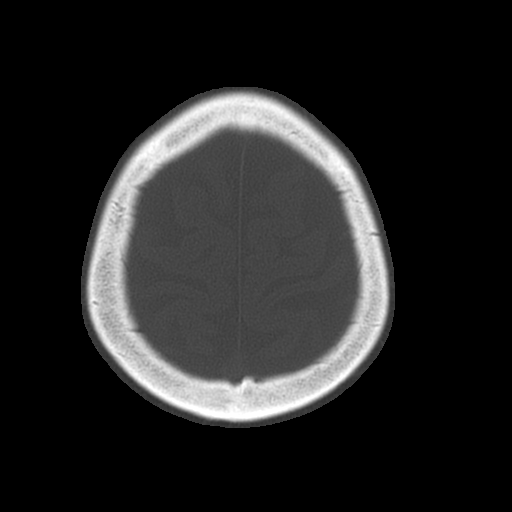

[16 of 30 positions shown; findings below may reference images not displayed]

FINDINGS: No evidence of parenchymal hemorrhage or extra-axial fluid
collection. No mass lesion, mass effect, or midline shift.

No CT evidence of acute infarction.

There is intracranial atherosclerosis and mild diffuse cerebral
volume loss. Nonspecific mild stable subcortical and periventricular
white matter hypodensity, most in keeping with chronic small vessel
ischemic change. No ventriculomegaly.

Stable tiny osteoma in the left posterior sphenoid sinus. Partial
opacification of the left frontal sinus. The visualized paranasal
sinuses are otherwise essentially clear. The mastoid air cells are
unopacified. No evidence of calvarial fracture.
IMPRESSION: 1.  No evidence of acute intracranial abnormality.
2. Intracranial atherosclerosis, mild diffuse cerebral volume loss
and mild chronic small vessel ischemic white matter change.
3. Left frontal sinusitis, likely chronic.

## 2016-12-22 ENCOUNTER — Encounter: Payer: Self-pay | Admitting: Internal Medicine

## 2016-12-22 ENCOUNTER — Ambulatory Visit (INDEPENDENT_AMBULATORY_CARE_PROVIDER_SITE_OTHER): Payer: Medicare Other | Admitting: Internal Medicine

## 2016-12-22 VITALS — BP 124/66 | HR 71 | Ht 67.0 in | Wt 146.8 lb

## 2016-12-22 DIAGNOSIS — I428 Other cardiomyopathies: Secondary | ICD-10-CM | POA: Diagnosis not present

## 2016-12-22 DIAGNOSIS — I739 Peripheral vascular disease, unspecified: Secondary | ICD-10-CM

## 2016-12-22 DIAGNOSIS — I951 Orthostatic hypotension: Secondary | ICD-10-CM | POA: Diagnosis not present

## 2016-12-22 NOTE — Patient Instructions (Signed)
Medication Instructions:  Your physician has recommended you make the following change in your medication:  1) Stop Pletal    Labwork: None ordered   Testing/Procedures: Your physician has requested that you have an echocardiogram. Echocardiography is a painless test that uses sound waves to create images of your heart. It provides your doctor with information about the size and shape of your heart and how well your heart's chambers and valves are working. This procedure takes approximately one hour. There are no restrictions for this procedure.    Follow-Up:   Your physician recommends that you schedule a follow-up appointment in: 3 months with Tommye Standard, PA   Any Other Special Instructions Will Be Listed Below (If Applicable).     If you need a refill on your cardiac medications before your next appointment, please call your pharmacy.

## 2016-12-22 NOTE — Progress Notes (Signed)
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 male who presents today for routine electrophysiology followup.  Since last being seen in our clinic, the patient reports doing reasonably well.  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  He has occasional postural dizziness but has not had syncope in over a year.  Today, he denies symptoms of palpitations, chest pain, shortness of breath, or lower extremity edema.  The patient is otherwise without complaint today.   Past Medical History:  Diagnosis Date  . Allergic rhinitis   . Arthritis    Gout  . BPH (benign prostatic hypertrophy)   . Bradycardia   . CAD (coronary artery disease)    nonobstructive  . Chronic anemia 2013   h/o IDA, did not tolerate oral iron, latest normal iron studies, B12, folate  . Diverticulosis of colon    conoscopy 12/1993 (Dr. Amedeo Plenty) S/G divertics//colonoscopy L&R divertics (Dr. Amedeo Plenty) 11/08/2004  . Dizziness   . Gout   . HTN (hypertension)   . Memory loss   . Mitral regurgitation    Pulmonic regurgitation, with marked LA enlargement  . Nonischemic cardiomyopathy (HCC)    EF 40%  . Orthostatic hypotension   . PVC (premature ventricular contraction)   . PVD (peripheral vascular disease) ([place])    followed by Dr Trula Slade, treating medically (pletal and aspirin)  . RBBB longstanding  . Squamous cell carcinoma in situ of skin 2015   L ear  . Syncope    Past Surgical History:  Procedure Laterality Date  . 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 APPENDECTOMY    . dexa  2004   "shrunk 2 inches", WNL    ROS- all systems are reviewed and negatives except as per HPI above  Current Outpatient Prescriptions  Medication Sig Dispense Refill  . Blood Pressure Monitoring (BLOOD PRESSURE MONITOR AUTOMAT) DEVI 1 Units by Does not apply route as needed. 1 Device 0  . cholecalciferol (VITAMIN D) 1000 units  tablet Take 1,000 Units by mouth daily.    . cilostazol (PLETAL) 50 MG tablet Take 1 tablet (50 mg total) by mouth daily.    Marland Kitchen dipyridamole-aspirin (AGGRENOX) 200-25 MG 12hr capsule take 1 capsule by mouth twice a day 60 capsule 11  . fludrocortisone (FLORINEF) 0.1 MG tablet Take 1 tablet (100 mcg) in the morning and 1/2 tablet (50 mcg) by mouth at bedtime.    . pravastatin (PRAVACHOL) 20 MG tablet take 1 tablet by mouth once daily 30 tablet 6  . vitamin B-12 (CYANOCOBALAMIN) 500 MCG tablet Take 500 mcg by mouth daily.     No current facility-administered medications for this visit.     Physical Exam: Vitals:   12/22/16 0942  BP: 124/66  Pulse: 71  SpO2: 97%  Weight: 146 lb 12.8 oz (66.6 kg)  Height: 5\' 7"  (1.702 m)    GEN- The patient is well appearing, alert and oriented x 3 today.   Head- normocephalic, atraumatic Eyes-  Sclera clear, conjunctiva pink Ears- hearing intact Oropharynx- clear Lungs- Clear to ausculation bilaterally, normal work of breathing Heart- Regular rate and rhythm, no murmurs, rubs or gallops, PMI not laterally displaced GI- soft, NT, ND, + BS Extremities- no clubbing, cyanosis, or edema  EKG tracing ordered today is personally reviewed and shows sinus rhythm 71 bpm, PR 174 msec, RBBB, rare PVCs  Assessment and Plan:  1.  Hypotension Chronic dysautonomia Adequate hydration, lifestyle modification, florinef discussed at length again today. Support hose have been encouraged but he does not wear He thinks that pletal may be contributing and would like to see if symptoms will improve off of this medicine I will therefore stop pletal today.  He will return to see EP PAC in 3 months to see if improved  2. Nonischemic CM Asymptomatic Unfortunately, given low BP, unable to treat with ACE/ARB/Beta blocker Repeat echo at this time  3. PAD Followed by Dr Trula Slade Stop pletal as above Restart pletal if #1 above is not improved  Return to see EP PA in 3  months Consider second opinion by Dr Caryl Comes if not improved at that time. I am happy to see when needed, but feel that I have little to add currently  Thompson Grayer MD, [facility] 12/22/2016 10:13 AM

## 2017-01-02 ENCOUNTER — Other Ambulatory Visit: Payer: Self-pay

## 2017-01-02 ENCOUNTER — Ambulatory Visit (HOSPITAL_COMMUNITY): Payer: Medicare Other | Attending: Internal Medicine

## 2017-01-02 DIAGNOSIS — I4510 Unspecified right bundle-branch block: Secondary | ICD-10-CM | POA: Diagnosis not present

## 2017-01-02 DIAGNOSIS — I119 Hypertensive heart disease without heart failure: Secondary | ICD-10-CM | POA: Insufficient documentation

## 2017-01-02 DIAGNOSIS — D649 Anemia, unspecified: Secondary | ICD-10-CM | POA: Diagnosis not present

## 2017-01-02 DIAGNOSIS — I2510 Atherosclerotic heart disease of native coronary artery without angina pectoris: Secondary | ICD-10-CM | POA: Diagnosis not present

## 2017-01-02 DIAGNOSIS — I340 Nonrheumatic mitral (valve) insufficiency: Secondary | ICD-10-CM | POA: Diagnosis not present

## 2017-01-02 DIAGNOSIS — I428 Other cardiomyopathies: Secondary | ICD-10-CM | POA: Insufficient documentation

## 2017-01-02 DIAGNOSIS — I472 Ventricular tachycardia: Secondary | ICD-10-CM | POA: Diagnosis not present

## 2017-01-02 DIAGNOSIS — I429 Cardiomyopathy, unspecified: Secondary | ICD-10-CM | POA: Diagnosis not present

## 2017-01-02 DIAGNOSIS — R001 Bradycardia, unspecified: Secondary | ICD-10-CM | POA: Diagnosis not present

## 2017-01-02 DIAGNOSIS — I739 Peripheral vascular disease, unspecified: Secondary | ICD-10-CM | POA: Diagnosis not present

## 2017-01-05 ENCOUNTER — Ambulatory Visit (INDEPENDENT_AMBULATORY_CARE_PROVIDER_SITE_OTHER): Payer: Medicare Other | Admitting: Neurology

## 2017-01-05 ENCOUNTER — Telehealth: Payer: Self-pay | Admitting: Internal Medicine

## 2017-01-05 ENCOUNTER — Encounter: Payer: Self-pay | Admitting: Neurology

## 2017-01-05 VITALS — BP 136/58 | HR 69 | Ht 68.5 in | Wt 148.0 lb

## 2017-01-05 DIAGNOSIS — G903 Multi-system degeneration of the autonomic nervous system: Secondary | ICD-10-CM

## 2017-01-05 DIAGNOSIS — G9389 Other specified disorders of brain: Secondary | ICD-10-CM

## 2017-01-05 DIAGNOSIS — G3184 Mild cognitive impairment, so stated: Secondary | ICD-10-CM | POA: Diagnosis not present

## 2017-01-05 DIAGNOSIS — I679 Cerebrovascular disease, unspecified: Secondary | ICD-10-CM

## 2017-01-05 NOTE — Patient Instructions (Signed)
Continue blood pressure control Follow up as needed.

## 2017-01-05 NOTE — Telephone Encounter (Signed)
Follow Up:    Returning your call, concerning his Echo results. 

## 2017-01-05 NOTE — Telephone Encounter (Signed)
Pt is aware and agreeable to normal results  

## 2017-01-05 NOTE — Progress Notes (Signed)
NEUROLOGY FOLLOW UP OFFICE NOTE  Adam Clements [id number]  HISTORY OF PRESENT ILLNESS: Adam Clements is an 82 year old right-handed male with neuro-cardiogenic orthostatic hypotension and PAD who follows up for mild cognitive impairment secondary to recurrent cerebral hypoperfusion.   UPDATE: Memory has been stable.  For orthostatic hypotension, he is on Florinef.  Pletal was discontinued last month and he feels his blood pressure has been under better control.  No episodes of syncope.  TTE last week demonstrated EF 40-45%.   HISTORY: He was diagnosed with neurocardiogenic orthostatic hypotension after experiencing several syncopal events in 2016.  He would often pass out after period of exertion or frequent voiding.   He has been treated with Florinef.  He has had recurrent episodes of difficulty with speech output.  They seem to correlate with low blood pressure.  On 01/01/15, he developed confusion.  He was preparing for a colonoscopy and had trouble understanding the report.  He also had trouble with verbal output and was slurring his speech.  The following morning, he had right-sided headache.  Blood pressure was found to be 64/49.  EKG showed NSR of 60 bpm.  He was treated with IV fluids and felt better.  the morning of 02/05/15, he developed confusion.  He had trouble figuring out how to put in his hearing aids.  He didn't feel well.  He presented to the ED, where CT of head showed chronic left frontal sinusitis but no acute intracranial process.  UA was negative.  Manifestation of his orthostatic hypotension was suspected and it was suggested that his Florinef should be adjusted.  He followed up with his PCP.  MRI and MRA of head was performed on 02/07/15 showed no acute infarct or intracranial stenosis.  Carotid doppler revealed no hemodynamically significant stenosis.  He exhibits symptoms of REM sleep behavior disorder.  He will often thrash his arms when dreaming about being attacked.   This is recurrent.  He has nonischemic cardiomyopathy.  Holter monitor revealed sinus rhythm with bundle branch block, frequent PACs, rare PVCs, rare nonsustained atrial tachycardia and rare nonsustained ventricular tachycardia.  No atrial fibrillation.    He also reports gradually worsening short-term memory as well.  He denies family history of dementia.  EEG from 03/05/15 was normal.  He had neuropsychological testing at [facility] on 04/10/15.  Testing suggested mild unspecified mental disorder as demonstrated by deficits in semantic fluency with weakness in verbal memory encoding and recognition.  Recurrent episodes of hypotension likely the cause of this.  PAST MEDICAL HISTORY: Past Medical History:  Diagnosis Date  . Allergic rhinitis   . Arthritis    Gout  . BPH (benign prostatic hypertrophy)   . Bradycardia   . CAD (coronary artery disease)    nonobstructive  . Chronic anemia 2013   h/o IDA, did not tolerate oral iron, latest normal iron studies, B12, folate  . Diverticulosis of colon    conoscopy 12/1993 (Dr. Amedeo Plenty) S/G divertics//colonoscopy L&R divertics (Dr. Amedeo Plenty) 11/08/2004  . Dizziness   . Gout   . HTN (hypertension)   . Memory loss   . Mitral regurgitation    Pulmonic regurgitation, with marked LA enlargement  . Nonischemic cardiomyopathy (HCC)    EF 40%  . Orthostatic hypotension   . PVC (premature ventricular contraction)   . PVD (peripheral vascular disease) ([facility])    followed by Dr Trula Slade, treating medically (pletal and aspirin)  . RBBB longstanding  . Squamous cell carcinoma in situ  of skin 2015   L ear  . Syncope     MEDICATIONS: Current Outpatient Prescriptions on File Prior to Visit  Medication Sig Dispense Refill  . Blood Pressure Monitoring (BLOOD PRESSURE MONITOR AUTOMAT) DEVI 1 Units by Does not apply route as needed. 1 Device 0  . cholecalciferol (VITAMIN D) 1000 units tablet Take 1,000 Units by mouth daily.    Marland Kitchen dipyridamole-aspirin (AGGRENOX)  200-25 MG 12hr capsule take 1 capsule by mouth twice a day 60 capsule 11  . fludrocortisone (FLORINEF) 0.1 MG tablet Take 1 tablet (100 mcg) in the morning and 1/2 tablet (50 mcg) by mouth at bedtime.    . pravastatin (PRAVACHOL) 20 MG tablet take 1 tablet by mouth once daily 30 tablet 6  . vitamin B-12 (CYANOCOBALAMIN) 500 MCG tablet Take 500 mcg by mouth daily.     No current facility-administered medications on file prior to visit.     ALLERGIES: Allergies  Allergen Reactions  . Atorvastatin Other (See Comments)    Memory trouble, confusion, fatigue  . Pletal [Cilostazol]     FAMILY HISTORY: Family History  Problem Relation Age of Onset  . Heart disease Mother 40       Natural causes with CHF  . Hypertension Mother   . Hypertension Father   . Stroke Father        cerebral hemorrhage  . Hyperlipidemia Brother   . Heart disease Brother        before age 34  . Hypertension Brother   . Heart attack Brother   . Hypertension Unknown     SOCIAL HISTORY: Social History   Social History  . Marital status: Married    Spouse name: N/A  . Number of children: N/A  . Years of education: N/A   Occupational History  . Not on file.   Social History Main Topics  . Smoking status: Former Smoker    Packs/day: 2.00    Types: Cigarettes    Quit date: 11/10/1982  . Smokeless tobacco: Former Systems developer  . Alcohol use 4.2 oz/week    7 Glasses of wine per week     Comment: 1 glass of wine daily. History of heavy drinking prior to 2005.  . Drug use: No  . Sexual activity: No   Other Topics Concern  . Not on file   Social History Narrative   Lives with wife [name]). Dog passed away Dec 02, 2012.   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 HCPOA is  wife then son and then oldest daughter etc. Advanced directives in chart Dec 02, 2012). Does not want artificial nutrition/hydration or prolonged life support      Does have stairs in home. BS Degree    REVIEW OF SYSTEMS: Constitutional: No fevers, chills, or sweats, no generalized fatigue, change in appetite Eyes: No visual changes, double vision, eye pain Ear, nose and throat: No hearing loss, ear pain, nasal congestion, sore throat Cardiovascular: No chest pain, palpitations Respiratory:  No shortness of breath at rest or with exertion, wheezes GastrointestinaI: No nausea, vomiting, diarrhea, abdominal pain, fecal incontinence Genitourinary:  No dysuria, urinary retention or frequency Musculoskeletal:  No neck pain, back pain Integumentary: No rash, pruritus, skin lesions Neurological: as above Psychiatric: No depression, insomnia, anxiety Endocrine: No palpitations, fatigue,  diaphoresis, mood swings, change in appetite, change in weight, increased thirst Hematologic/Lymphatic:  No purpura, petechiae. Allergic/Immunologic: no itchy/runny eyes, nasal congestion, recent allergic reactions, rashes  PHYSICAL EXAM: Vitals:   01/05/17 0924  BP: (!) 136/58  Pulse: 69  SpO2: 97%   General: No acute distress.  Patient appears well-groomed.  normal body habitus. Head:  Normocephalic/atraumatic Eyes:  Fundi examined but not visualized Neck: supple, no paraspinal tenderness, full range of motion Heart:  Regular rate and rhythm Lungs:  Clear to auscultation bilaterally Back: No paraspinal tenderness Neurological Exam: alert and oriented to person, place, and time. Attention span and concentration intact, recent and remote memory intact, fund of knowledge intact.  Speech fluent and not dysarthric, language intact.  CN II-XII intact. Bulk and tone normal, muscle strength 5/5 throughout.  Sensation to light touch  intact.  Deep tendon reflexes 2+ throughout.  Finger to nose testing intact.  Gait  normal, Romberg negative.  IMPRESSION: 1.  Two episodes of altered awareness.  First episode with loss of awareness and second episode with dizziness.  Most likely due to acute attack of cerebral hypoperfusion from  orthostatic hypotension.  2.  Mild cognitive impairment, secondary to recurrent episodes of cerebral hypoperfusion.  No evidence of an underlying neurodegenerative disease such as Alzheimer's  PLAN: Continue management of blood pressure Follow up as needed.  16 minutes spent face to face with patient, over 50% spent discussing diagnosis and management.  Metta Clines, DO  CC:  Ria Bush, MD

## 2017-01-07 ENCOUNTER — Telehealth: Payer: Self-pay

## 2017-01-07 NOTE — Telephone Encounter (Signed)
spoke with patient about result of echocardiogram. patient verbalized understanding.  patient thanked me for my call. copy sent to PCP.

## 2017-01-11 ENCOUNTER — Encounter: Payer: Self-pay | Admitting: Family Medicine

## 2017-03-18 ENCOUNTER — Other Ambulatory Visit: Payer: Self-pay | Admitting: Family Medicine

## 2017-04-14 ENCOUNTER — Encounter: Payer: Self-pay | Admitting: Family Medicine

## 2017-04-14 ENCOUNTER — Ambulatory Visit (INDEPENDENT_AMBULATORY_CARE_PROVIDER_SITE_OTHER): Payer: Medicare Other | Admitting: Family Medicine

## 2017-04-14 ENCOUNTER — Telehealth: Payer: Self-pay | Admitting: Family Medicine

## 2017-04-14 VITALS — BP 144/84 | HR 64 | Temp 98.0°F | Wt 154.0 lb

## 2017-04-14 DIAGNOSIS — I951 Orthostatic hypotension: Secondary | ICD-10-CM | POA: Diagnosis not present

## 2017-04-14 DIAGNOSIS — R3915 Urgency of urination: Secondary | ICD-10-CM | POA: Diagnosis not present

## 2017-04-14 DIAGNOSIS — R339 Retention of urine, unspecified: Secondary | ICD-10-CM | POA: Insufficient documentation

## 2017-04-14 LAB — POC URINALSYSI DIPSTICK (AUTOMATED)
GLUCOSE UA: NEGATIVE
Ketones, UA: NEGATIVE
NITRITE UA: NEGATIVE
Protein, UA: NEGATIVE
RBC UA: NEGATIVE
Spec Grav, UA: >=1.03 — AB (ref 1.010–1.025)
Urobilinogen, UA: 0.2 E.U./dL
pH, UA: 6 (ref 5.0–8.0)

## 2017-04-14 MED ORDER — CEPHALEXIN 500 MG PO CAPS: 500.0000 mg | ORAL_CAPSULE | Freq: Two times a day (BID) | ORAL | 0 refills | Status: DC

## 2017-04-14 NOTE — Assessment & Plan Note (Signed)
UA/micro suspicious for infection. UCx sent. Rx keflex 7d course. Update if not improving with treatment, would consider decreased florinef dose in am.

## 2017-04-14 NOTE — Progress Notes (Signed)
BP (!) 144/84 (BP Location: Right Arm, Cuff Size: Normal)   Pulse 64   Temp 98 F (36.7 C) (Oral)   Wt 154 lb (69.9 kg)   SpO2 98%   BMI 23.08 kg/m    CC: urinary urgency Subjective:    Patient ID: Adam Clements, male    DOB: May 27, 1934, 83 y.o.   MRN: [id number]  HPI: Adam Clements is a 83 y.o. male presenting on 04/14/2017 for Urinary Urgency (Started about 1 mo ago. Every evening, has urge to urinate many times.)   1 mo h/o urinary urgency and frequency only in the evenings. Passes large amount of urine between 6-9pm. Every hour. Nocturia x1.  He has glass of wine around this time as well.   Denies fevers/chills, nausea/vomiting, flank pain, dysuria, hematuria. Denies back or groin pain.  Denies low temperatures.   He takes fludrocortisone 1 tab in am and 1/2 in pm. He stopped cilostazole. This has significantly helped his orthostatic hypotension and syncope   Relevant past medical, surgical, family and social history reviewed and updated as indicated. Interim medical history since our last visit reviewed. Allergies and medications reviewed and updated. Outpatient Medications Prior to Visit  Medication Sig Dispense Refill  . cholecalciferol (VITAMIN D) 1000 units tablet Take 1,000 Units by mouth daily.    Marland Kitchen dipyridamole-aspirin (AGGRENOX) 200-25 MG 12hr capsule take 1 capsule by mouth twice a day 60 capsule 11  . fludrocortisone (FLORINEF) 0.1 MG tablet Take 1 tablet (100 mcg) in the morning and 1/2 tablet (50 mcg) by mouth at bedtime.    . pravastatin (PRAVACHOL) 20 MG tablet take 1 tablet by mouth once daily 30 tablet 1  . vitamin B-12 (CYANOCOBALAMIN) 500 MCG tablet Take 500 mcg by mouth daily.    . Blood Pressure Monitoring (BLOOD PRESSURE MONITOR AUTOMAT) DEVI 1 Units by Does not apply route as needed. 1 Device 0   No facility-administered medications prior to visit.      Per HPI unless specifically indicated in ROS section below Review of Systems       Objective:    BP (!) 144/84 (BP Location: Right Arm, Cuff Size: Normal)   Pulse 64   Temp 98 F (36.7 C) (Oral)   Wt 154 lb (69.9 kg)   SpO2 98%   BMI 23.08 kg/m   Wt Readings from Last 3 Encounters:  04/14/17 154 lb (69.9 kg)  01/05/17 148 lb (67.1 kg)  12/22/16 146 lb 12.8 oz (66.6 kg)    Physical Exam  Constitutional: He appears well-developed and well-nourished. No distress.  HENT:  Head: Normocephalic and atraumatic.  Mouth/Throat: Oropharynx is clear and moist. No oropharyngeal exudate.  Eyes: Conjunctivae and EOM are normal. Pupils are equal, round, and reactive to light.  Abdominal: Soft. Normal appearance and bowel sounds are normal. He exhibits no distension and no mass. There is no hepatosplenomegaly. There is no tenderness. There is no rigidity, no rebound, no guarding, no CVA tenderness and negative Murphy's sign.  Musculoskeletal: He exhibits no edema.  Skin: Skin is warm and dry. No rash noted.  Psychiatric: He has a normal mood and affect.  Nursing note and vitals reviewed.  Results for orders placed or performed in visit on 04/14/17  POCT Urinalysis Dipstick (Automated)  Result Value Ref Range   Color, UA yellow    Clarity, UA clear    Glucose, UA negative    Bilirubin, UA 1+    Ketones, UA negative    Spec  Grav, UA >=1.030 (A) 1.010 - 1.025   Blood, UA negative    pH, UA 6.0 5.0 - 8.0   Protein, UA negative    Urobilinogen, UA 0.2 0.2 or 1.0 E.U./dL   Nitrite, UA negative    Leukocytes, UA Large (3+) (A) Negative      Assessment & Plan:   Problem List Items Addressed This Visit    Orthostatic hypotension    Doing much better since stopping cilostazol and with florinef use. Continue current regimen.       Urinary urgency - Primary    UA/micro suspicious for infection. UCx sent. Rx keflex 7d course. Update if not improving with treatment, would consider decreased florinef dose in am.       Relevant Orders   Urine Culture   POCT Urinalysis  Dipstick (Automated) (Completed)       Follow up plan: Return if symptoms worsen or fail to improve.  Ria Bush, MD

## 2017-04-14 NOTE — Assessment & Plan Note (Signed)
Doing much better since stopping cilostazol and with florinef use. Continue current regimen.

## 2017-04-14 NOTE — Patient Instructions (Signed)
Possible urinary infection causing symptoms - treat with keflex twice daily for 7 days. I will send urine culture to check for infection. We will recheck at visit next week.

## 2017-04-14 NOTE — Telephone Encounter (Signed)
Copied from [facility]. Topic: Quick Communication - See Telephone Encounter >> Apr 14, 2017 11:13 AM Arletha Grippe wrote: CRM for notification. See Telephone encounter for:   04/14/17.Pt called - he has been having the urge to urinate every evening every 45 minutes for a 3 hour period. This has been going on for the past few weeks.  Pt has appt on 04/20/17 with provider, but wanted to speak to someone about this sooner.  Pt declined to make an additional appt with pcp.

## 2017-04-14 NOTE — Telephone Encounter (Signed)
I spoke with pt and he said for few weeks everyday between 5:30 pm - 9PM pt has urgency of urine to void every 45' to 60'. No burning or pain when urinates. Pt scheduled appt 04/14/17 at 2:45 to see [name].

## 2017-04-15 LAB — URINE CULTURE
MICRO NUMBER:: 90090612
Result:: NO GROWTH
SPECIMEN QUALITY:: ADEQUATE

## 2017-04-20 ENCOUNTER — Encounter: Payer: Self-pay | Admitting: Family Medicine

## 2017-04-20 ENCOUNTER — Ambulatory Visit (INDEPENDENT_AMBULATORY_CARE_PROVIDER_SITE_OTHER): Payer: Medicare Other | Admitting: Family Medicine

## 2017-04-20 VITALS — BP 116/72 | HR 59 | Temp 97.9°F

## 2017-04-20 DIAGNOSIS — I951 Orthostatic hypotension: Secondary | ICD-10-CM

## 2017-04-20 DIAGNOSIS — R3915 Urgency of urination: Secondary | ICD-10-CM | POA: Diagnosis not present

## 2017-04-20 NOTE — Progress Notes (Signed)
BP 116/72 (BP Location: Right Arm, Cuff Size: Normal)   Pulse (!) 59   Temp 97.9 F (36.6 C) (Oral)   SpO2 95%    CC: 4 mo f/u visit Subjective:    Patient ID: Adam Clements, male    DOB: 12-02-1934, 83 y.o.   MRN: [id number]  HPI: Adam Clements is a 83 y.o. male presenting on 04/20/2017 for 4 mo follow-up   See prior note for details - urinary urgency in evenings treated for possible UTI with keflex 1 wk course. UCx was negative. Notes urinary urgency around time when he drinks 1 glass of wine at night.   Orthostatic hypotension - improved on florinef and off cilostazol.   Moderate weight gain noted.  Continues going to gym regularly.  Relevant past medical, surgical, family and social history reviewed and updated as indicated. Interim medical history since our last visit reviewed. Allergies and medications reviewed and updated. Outpatient Medications Prior to Visit  Medication Sig Dispense Refill  . cephALEXin (KEFLEX) 500 MG capsule Take 1 capsule (500 mg total) by mouth 2 (two) times daily. 14 capsule 0  . cholecalciferol (VITAMIN D) 1000 units tablet Take 1,000 Units by mouth daily.    Marland Kitchen dipyridamole-aspirin (AGGRENOX) 200-25 MG 12hr capsule take 1 capsule by mouth twice a day 60 capsule 11  . fludrocortisone (FLORINEF) 0.1 MG tablet Take 1 tablet (100 mcg) in the morning and 1/2 tablet (50 mcg) by mouth at bedtime.    . pravastatin (PRAVACHOL) 20 MG tablet take 1 tablet by mouth once daily 30 tablet 1  . vitamin B-12 (CYANOCOBALAMIN) 500 MCG tablet Take 500 mcg by mouth daily.     No facility-administered medications prior to visit.      Per HPI unless specifically indicated in ROS section below Review of Systems     Objective:    BP 116/72 (BP Location: Right Arm, Cuff Size: Normal)   Pulse (!) 59   Temp 97.9 F (36.6 C) (Oral)   SpO2 95%   Wt Readings from Last 3 Encounters:  04/14/17 154 lb (69.9 kg)  01/05/17 148 lb (67.1 kg)  12/22/16 146 lb 12.8 oz  (66.6 kg)    Physical Exam  Constitutional: He appears well-developed and well-nourished. No distress.  HENT:  Head: Normocephalic and atraumatic.  Mouth/Throat: Oropharynx is clear and moist. No oropharyngeal exudate.  Eyes: Conjunctivae and EOM are normal. Pupils are equal, round, and reactive to light. No scleral icterus.  Cardiovascular: Normal rate, regular rhythm, normal heart sounds and intact distal pulses.  No murmur heard. Pulmonary/Chest: Effort normal and breath sounds normal. No respiratory distress. He has no wheezes. He has no rales.  Musculoskeletal: He exhibits no edema.  Skin: Skin is warm and dry. No rash noted.  Psychiatric: He has a normal mood and affect.  Nursing note and vitals reviewed.  Results for orders placed or performed in visit on 04/14/17  Urine Culture  Result Value Ref Range   MICRO NUMBER: [id number]    SPECIMEN QUALITY: ADEQUATE    Sample Source NOT GIVEN    STATUS: FINAL    Result: No Growth   POCT Urinalysis Dipstick (Automated)  Result Value Ref Range   Color, UA yellow    Clarity, UA clear    Glucose, UA negative    Bilirubin, UA 1+    Ketones, UA negative    Spec Grav, UA >=1.030 (A) 1.010 - 1.025   Blood, UA negative    pH, UA  6.0 5.0 - 8.0   Protein, UA negative    Urobilinogen, UA 0.2 0.2 or 1.0 E.U./dL   Nitrite, UA negative    Leukocytes, UA Large (3+) (A) Negative      Assessment & Plan:   Problem List Items Addressed This Visit    Orthostatic hypotension - Primary    Significant symptomatic improvement off cilostazol.  Blood pressures a bit elevated today - but persistent orthostasis noted so no changes made.      Urinary urgency    UCx no growth. ?alcohol related urgency.  Will monitor for this.           Follow up plan: Return in about 5 months (around 09/18/2017) for annual exam, prior fasting for blood work, medicare wellness visit.  Ria Bush, MD

## 2017-04-20 NOTE — Assessment & Plan Note (Addendum)
Significant symptomatic improvement off cilostazol.  Blood pressures a bit elevated today - but persistent orthostasis noted so no changes made.

## 2017-04-20 NOTE — Patient Instructions (Addendum)
See if alcohol intake could be related to your night time urination symptoms. Continue florinef as up to now.  Keep an eye on blood pressures at home.  You are doing well today.  Return in 5 months for medicare wellness with Katha Cabal and follow up with me.

## 2017-04-20 NOTE — Assessment & Plan Note (Signed)
UCx no growth. ?alcohol related urgency.  Will monitor for this.

## 2017-04-25 ENCOUNTER — Other Ambulatory Visit: Payer: Self-pay | Admitting: Internal Medicine

## 2017-04-28 ENCOUNTER — Other Ambulatory Visit: Payer: Self-pay | Admitting: Internal Medicine

## 2017-04-28 NOTE — Telephone Encounter (Signed)
Pt's medication was sent to pt's pharmacy as requested. Confirmation received.  °

## 2017-05-01 ENCOUNTER — Other Ambulatory Visit: Payer: Self-pay | Admitting: Family Medicine

## 2017-05-01 NOTE — Telephone Encounter (Signed)
Last filled:  04/01/17, #60 Last OV:  04/20/17 Next OV:  09/22/17

## 2017-05-23 ENCOUNTER — Other Ambulatory Visit: Payer: Self-pay | Admitting: Family Medicine

## 2017-08-14 ENCOUNTER — Telehealth: Payer: Self-pay

## 2017-08-14 NOTE — Telephone Encounter (Signed)
Agree. Will see Tuesday

## 2017-08-14 NOTE — Telephone Encounter (Signed)
Adam Clements received mychart note pt had made appt with DR  Adam Clements on 08/18/17 at 9 AM for lightheadedness,memory lapses and blind spots; I called pt and he said had problem like this for over a year but has worsened since the warmer weather. Pt said on and off he has trouble remembering in the afternoon what he did that morning. No CP or SOB. This morning pt did have to put his head between his legs x 3 due to weakness; pt has not taken BP today. Last time taken 08/12/17 BP 100/68. Pt taking Florineg 0.1 mg taking one tab in AM and 1/2 tab in PM.  [name] said chronic problem and pt can keep appt on 08/18/17 and in  Meantime drink plenty of water, stay out of heat and as cool as possible and ck BP each morning until he sees [name]. Pt voiced understanding and will keep appt on 08/18/17; if any major worsening of symptoms will go to ED if needed. FYI to [name].

## 2017-08-18 ENCOUNTER — Encounter: Payer: Self-pay | Admitting: Family Medicine

## 2017-08-18 ENCOUNTER — Ambulatory Visit (INDEPENDENT_AMBULATORY_CARE_PROVIDER_SITE_OTHER): Payer: Medicare Other | Admitting: Family Medicine

## 2017-08-18 VITALS — BP 110/56 | HR 61 | Temp 97.9°F | Ht 68.5 in | Wt 146.8 lb

## 2017-08-18 DIAGNOSIS — I951 Orthostatic hypotension: Secondary | ICD-10-CM

## 2017-08-18 DIAGNOSIS — I739 Peripheral vascular disease, unspecified: Secondary | ICD-10-CM | POA: Diagnosis not present

## 2017-08-18 DIAGNOSIS — H5462 Unqualified visual loss, left eye, normal vision right eye: Secondary | ICD-10-CM | POA: Diagnosis not present

## 2017-08-18 MED ORDER — PRAVASTATIN SODIUM 20 MG PO TABS: 20.0000 mg | ORAL_TABLET | Freq: Every day | ORAL | 11 refills | Status: DC

## 2017-08-18 NOTE — Assessment & Plan Note (Signed)
Worsening dizziness worse with exertion despite regular use of compression stockings and stopping pletal Anticipate heat contributing to easier dehydration and vasodilation leading to worsening symptoms.  Increase florinef to 0.1mg  bid (currently taking 1/2 tab at night). Update with effect. Encouraged renewed efforts to stay well hydrated with water, gatorade, etc

## 2017-08-18 NOTE — Progress Notes (Signed)
BP (!) 110/56 (BP Location: Right Leg, Cuff Size: Normal)   Pulse 61   Temp 97.9 F (36.6 C) (Oral)   Ht 5' 8.5" (1.74 m)   Wt 146 lb 12 oz (66.6 kg)   SpO2 96%   BMI 21.99 kg/m    Visual Acuity Screening   Right eye Left eye Both eyes  Without correction:     With correction: 20/20 20/25 20/20      CC: worsening dizziness and memory lapses.  Subjective:    Patient ID: Adam Clements, male    DOB: May 16, 1934, 83 y.o.   MRN: [id number]  HPI: Adam Clements is a 83 y.o. male presenting on 08/18/2017 for Dizziness (Has had on and off for yrs, but worsened since weather has warmed up.); Memory Loss (C/o memory laspses for yrs, noticing more often. ); and Vision issues (C/o blind spots in left eye in the mornings for yrs. Last for awhile but usually gone by afternoon.)   Known severe orthostatic hypotension - previously controlled on florinef 0.1mg  in am and 0.05mg  (1/2 tab) in pm and discontinuing cilostazole (pletal). Worsening symptoms with warmer weather. He is wearing compression stockings. Notices more trouble with exertion (ie barn chorse in the morning - has brought chair into barn to rest). 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   New blind spots always L eye worse in the mornings, present for years but progressively worsening. Seems to be related to orthostatic dizziness. No floaters or flashing lights. This can improve with rest. Visual field loss is usually gone by late afternoon. Has not had ophthalmology eval - last saw for routine visit >1 yr ago. No cloudy vision.   Has had thorough neurological evaluation 2018.   Relevant past medical, surgical, family and social history reviewed and updated as indicated. Interim medical history since our last visit reviewed. Allergies and medications reviewed and updated. Outpatient Medications Prior to Visit  Medication Sig Dispense Refill  . cholecalciferol (VITAMIN D) 1000 units tablet Take 1,000 Units by  mouth daily.    Marland Kitchen dipyridamole-aspirin (AGGRENOX) 200-25 MG 12hr capsule take 1 capsule by mouth twice a day 60 capsule 11  . fludrocortisone (FLORINEF) 0.1 MG tablet take 1 tablet by mouth twice a day 180 tablet 2  . vitamin B-12 (CYANOCOBALAMIN) 500 MCG tablet Take 500 mcg by mouth daily.    . pravastatin (PRAVACHOL) 20 MG tablet take 1 tablet by mouth once daily 30 tablet 4  . cephALEXin (KEFLEX) 500 MG capsule Take 1 capsule (500 mg total) by mouth 2 (two) times daily. 14 capsule 0   No facility-administered medications prior to visit.      Per HPI unless specifically indicated in ROS section below Review of Systems     Objective:    BP (!) 110/56 (BP Location: Right Leg, Cuff Size: Normal)   Pulse 61   Temp 97.9 F (36.6 C) (Oral)   Ht 5' 8.5" (1.74 m)   Wt 146 lb 12 oz (66.6 kg)   SpO2 96%   BMI 21.99 kg/m   Wt Readings from Last 3 Encounters:  08/18/17 146 lb 12 oz (66.6 kg)  04/14/17 154 lb (69.9 kg)  01/05/17 148 lb (67.1 kg)    Physical Exam  Constitutional: He appears well-developed and well-nourished. No distress.  HENT:  Mouth/Throat: Oropharynx is clear and moist. No oropharyngeal exudate.  Cardiovascular: Normal rate, regular rhythm and normal heart sounds.  No murmur heard. Pulmonary/Chest: Effort normal  and breath sounds normal. No respiratory distress. He has no wheezes. He has no rales.  Musculoskeletal: He exhibits no edema.  Neurological: He is alert. He has normal strength. No cranial nerve deficit. He displays a negative Romberg sign.  CN 2-12 intact FTN intact EOMI No pronator drift  Nursing note and vitals reviewed.  Lab Results  Component Value Date   CREATININE 1.17 09/11/2016   BUN 23 09/11/2016   NA 139 09/11/2016   K 3.7 09/11/2016   CL 105 09/11/2016   CO2 28 09/11/2016      Assessment & Plan:   Problem List Items Addressed This Visit    Orthostatic hypotension - Primary    Worsening dizziness worse with exertion despite  regular use of compression stockings and stopping pletal Anticipate heat contributing to easier dehydration and vasodilation leading to worsening symptoms.  Increase florinef to 0.1mg  bid (currently taking 1/2 tab at night). Update with effect. Encouraged renewed efforts to stay well hydrated with water, gatorade, etc      Relevant Medications   pravastatin (PRAVACHOL) 20 MG tablet   PAD (peripheral artery disease) ([place])    Now off pletal.       Relevant Medications   pravastatin (PRAVACHOL) 20 MG tablet   Vision loss, left eye    Longstanding. Has not had ophtho eval - will refer. Visual acuity ok.       Relevant Orders   Ambulatory referral to Ophthalmology       Meds ordered this encounter  Medications  . pravastatin (PRAVACHOL) 20 MG tablet    Sig: Take 1 tablet (20 mg total) by mouth daily.    Dispense:  30 tablet    Refill:  11   Orders Placed This Encounter  Procedures  . Ambulatory referral to Ophthalmology    Referral Priority:   Routine    Referral Type:   Consultation    Referral Reason:   Specialty Services Required    Requested Specialty:   Ophthalmology    Number of Visits Requested:   1    Follow up plan: Return if symptoms worsen or fail to improve.  Ria Bush, MD

## 2017-08-18 NOTE — Assessment & Plan Note (Signed)
Now off pletal.

## 2017-08-18 NOTE — Patient Instructions (Addendum)
Visual acuity testing today Increase florinef to 0.1mg  twice daily (whole tablet at night time).  With warmer weather we have to make renewed efforts at staying well hydrated with water, gatorade, etc.  We will refer you to eye doctor for further evaluation.

## 2017-08-18 NOTE — Assessment & Plan Note (Signed)
Longstanding. Has not had ophtho eval - will refer. Visual acuity ok.

## 2017-08-21 DIAGNOSIS — H2513 Age-related nuclear cataract, bilateral: Secondary | ICD-10-CM | POA: Diagnosis not present

## 2017-08-24 ENCOUNTER — Telehealth: Payer: Self-pay | Admitting: *Deleted

## 2017-08-24 DIAGNOSIS — R29898 Other symptoms and signs involving the musculoskeletal system: Secondary | ICD-10-CM

## 2017-08-24 DIAGNOSIS — H5462 Unqualified visual loss, left eye, normal vision right eye: Secondary | ICD-10-CM

## 2017-08-24 DIAGNOSIS — I6523 Occlusion and stenosis of bilateral carotid arteries: Secondary | ICD-10-CM

## 2017-08-24 DIAGNOSIS — R29818 Other symptoms and signs involving the nervous system: Secondary | ICD-10-CM

## 2017-08-24 NOTE — Telephone Encounter (Signed)
Copied from [place]. Topic: General - Other >> Aug 24, 2017  9:41 AM Carolyn Stare wrote:  Pt said he has a bad experience on 08/23/17 with balance and left leg was weak but is better today and wanted to let the doctor know  [phone]

## 2017-08-25 NOTE — Telephone Encounter (Signed)
Pt requesting Dr. Danise Mina to call him as soon as possible regarding below message.

## 2017-08-25 NOTE — Telephone Encounter (Signed)
I spoke with pt and pt is concerned about poor balance on and off with lt leg weakness. Pt said on Sun the lt leg did not go completely numb but did not want to work correctly;pt had short term memory issues also. Pt said he was completely exhausted on 08/23/17. Pt had arc of blindness in lt eye. Felt weak and lightheaded and put head between knees BP on 08/23/17 at 80/60.Today feels better today but still is not feeling his usual self.BP now is 135/75. Pt has been taking florinef 0.1 mg bid. Pt seen on 08/18/17. Pt concerned because symptoms worsed on 08/23/17 and today he feels better but still not his normal self. Pt does not know if needs to be rechecked or what to do . Request cb after [name] reviews.

## 2017-08-25 NOTE — Telephone Encounter (Signed)
Currently at gym Sunday had episode of L leg weakness affecting ambulation that started after upon awakening, associated with fatigue and short term memory trouble.  He also had L vision loss in arc shape to lateral vision. He did have some low bp readings.  Yesterday felt better and today even better.  Eye doctor exam yesterday said vision and eye exam were all normal.  Due for carotid - will also check head CT. Pt agrees with plan.  Will try to get done in the next week.

## 2017-08-26 ENCOUNTER — Ambulatory Visit (INDEPENDENT_AMBULATORY_CARE_PROVIDER_SITE_OTHER)
Admission: RE | Admit: 2017-08-26 | Discharge: 2017-08-26 | Disposition: A | Discharge status: Home / Self Care | Payer: Medicare Other | Source: Ambulatory Visit | Attending: Family Medicine | Admitting: Family Medicine

## 2017-08-26 DIAGNOSIS — R29818 Other symptoms and signs involving the nervous system: Secondary | ICD-10-CM

## 2017-08-26 DIAGNOSIS — H5462 Unqualified visual loss, left eye, normal vision right eye: Secondary | ICD-10-CM

## 2017-08-26 DIAGNOSIS — R29898 Other symptoms and signs involving the musculoskeletal system: Secondary | ICD-10-CM

## 2017-08-26 DIAGNOSIS — R531 Weakness: Secondary | ICD-10-CM | POA: Diagnosis not present

## 2017-08-26 NOTE — Telephone Encounter (Signed)
CT scan at [facility] CT scheduled for today and Carotid set up for Friday, patient aware.

## 2017-08-28 ENCOUNTER — Ambulatory Visit (HOSPITAL_COMMUNITY)
Admission: RE | Admit: 2017-08-28 | Discharge: 2017-08-28 | Disposition: A | Discharge status: Home / Self Care | Payer: Medicare Other | Source: Ambulatory Visit | Attending: Cardiology | Admitting: Cardiology

## 2017-08-28 DIAGNOSIS — I6523 Occlusion and stenosis of bilateral carotid arteries: Secondary | ICD-10-CM | POA: Diagnosis not present

## 2017-09-15 ENCOUNTER — Other Ambulatory Visit: Payer: Self-pay | Admitting: Family Medicine

## 2017-09-15 DIAGNOSIS — D509 Iron deficiency anemia, unspecified: Secondary | ICD-10-CM

## 2017-09-15 DIAGNOSIS — M1A09X0 Idiopathic chronic gout, multiple sites, without tophus (tophi): Secondary | ICD-10-CM

## 2017-09-15 DIAGNOSIS — G459 Transient cerebral ischemic attack, unspecified: Secondary | ICD-10-CM

## 2017-09-16 ENCOUNTER — Ambulatory Visit: Payer: Self-pay

## 2017-09-16 ENCOUNTER — Ambulatory Visit (INDEPENDENT_AMBULATORY_CARE_PROVIDER_SITE_OTHER): Payer: Medicare Other

## 2017-09-16 VITALS — BP 114/60 | HR 58 | Temp 97.7°F | Ht 68.5 in | Wt 146.5 lb

## 2017-09-16 DIAGNOSIS — Z0000 Encounter for general adult medical examination without abnormal findings: Secondary | ICD-10-CM | POA: Diagnosis not present

## 2017-09-16 DIAGNOSIS — D509 Iron deficiency anemia, unspecified: Secondary | ICD-10-CM

## 2017-09-16 DIAGNOSIS — G459 Transient cerebral ischemic attack, unspecified: Secondary | ICD-10-CM | POA: Diagnosis not present

## 2017-09-16 DIAGNOSIS — M1A09X0 Idiopathic chronic gout, multiple sites, without tophus (tophi): Secondary | ICD-10-CM

## 2017-09-16 LAB — LIPID PANEL
CHOL/HDL RATIO: 2
Cholesterol: 137 mg/dL (ref 0–200)
HDL: 58.6 mg/dL (ref >39.00)
LDL Cholesterol: 71 mg/dL (ref 0–99)
NONHDL: 78.12
TRIGLYCERIDES: 34 mg/dL (ref 0.0–149.0)
VLDL: 6.8 mg/dL (ref 0.0–40.0)

## 2017-09-16 LAB — URIC ACID: URIC ACID, SERUM: 4.5 mg/dL (ref 4.0–7.8)

## 2017-09-16 LAB — COMPREHENSIVE METABOLIC PANEL
ALT: 13 U/L (ref 0–53)
AST: 16 U/L (ref 0–37)
Albumin: 3.4 g/dL — ABNORMAL LOW (ref 3.5–5.2)
Alkaline Phosphatase: 43 U/L (ref 39–117)
BILIRUBIN TOTAL: 0.7 mg/dL (ref 0.2–1.2)
BUN: 22 mg/dL (ref 6–23)
CHLORIDE: 104 meq/L (ref 96–112)
CO2: 30 meq/L (ref 19–32)
CREATININE: 1.06 mg/dL (ref 0.40–1.50)
Calcium: 8.8 mg/dL (ref 8.4–10.5)
GFR: 71.2 mL/min (ref >60.00)
GLUCOSE: 88 mg/dL (ref 70–99)
POTASSIUM: 4 meq/L (ref 3.5–5.1)
SODIUM: 139 meq/L (ref 135–145)
Total Protein: 5.7 g/dL — ABNORMAL LOW (ref 6.0–8.3)

## 2017-09-16 LAB — CBC WITH DIFFERENTIAL/PLATELET
Basophils Absolute: 0 10*3/uL (ref 0.0–0.1)
Basophils Relative: 0.5 % (ref 0.0–3.0)
EOS ABS: 0.2 10*3/uL (ref 0.0–0.7)
EOS PCT: 5.4 % — AB (ref 0.0–5.0)
HCT: 33.6 % — ABNORMAL LOW (ref 39.0–52.0)
Hemoglobin: 11.8 g/dL — ABNORMAL LOW (ref 13.0–17.0)
LYMPHS ABS: 0.8 10*3/uL (ref 0.7–4.0)
Lymphocytes Relative: 21.6 % (ref 12.0–46.0)
MCHC: 35.1 g/dL (ref 30.0–36.0)
MCV: 94.9 fl (ref 78.0–100.0)
MONO ABS: 0.5 10*3/uL (ref 0.1–1.0)
Monocytes Relative: 12 % (ref 3.0–12.0)
NEUTROS PCT: 60.5 % (ref 43.0–77.0)
Neutro Abs: 2.3 10*3/uL (ref 1.4–7.7)
Platelets: 187 10*3/uL (ref 150.0–400.0)
RBC: 3.54 Mil/uL — AB (ref 4.22–5.81)
RDW: 12.9 % (ref 11.5–15.5)
WBC: 3.9 10*3/uL — ABNORMAL LOW (ref 4.0–10.5)

## 2017-09-16 LAB — FERRITIN: Ferritin: 50.1 ng/mL (ref 22.0–322.0)

## 2017-09-16 NOTE — Progress Notes (Signed)
Subjective:   Adam Clements is a 83 y.o. male who presents for Medicare Annual/Subsequent preventive examination.  Review of Systems:  N/A Cardiac Risk Factors include: advanced age (>58men, >79 women);male gender     Objective:    Vitals: BP 114/60 (BP Location: Right Arm, Patient Position: Sitting, Cuff Size: Normal)   Pulse (!) 58   Temp 97.7 F (36.5 C) (Oral)   Ht 5' 8.5" (1.74 m) Comment: shoes  Wt 146 lb 8 oz (66.5 kg)   SpO2 97%   BMI 21.95 kg/m   Body mass index is 21.95 kg/m.  Advanced Directives 09/16/2017 09/11/2016 02/05/2015 12/27/2013  Does Patient Have a Medical Advance Directive? Yes Yes No Yes  Type of Paramedic of [place];Living will [place];Living will - [place];Living will  Does patient want to make changes to medical advance directive? - - - No - Patient declined  Copy of [place] in Chart? No - copy requested Yes - No - copy requested  Would patient like information on creating a medical advance directive? - - No - patient declined information -    Tobacco Social History   Tobacco Use  Smoking Status Former Smoker  . Packs/day: 2.00  . Types: Cigarettes  . Last attempt to quit: 11/10/1982  . Years since quitting: 34.8  Smokeless Tobacco Former Engineer, structural given: No   Clinical Intake:  Pre-visit preparation completed: Yes  Pain : No/denies pain Pain Score: 0-No pain     Nutritional Status: BMI of 19-24  Normal Nutritional Risks: Unintentional weight loss Diabetes: No  How often do you need to have someone help you when you read instructions, pamphlets, or other written materials from your doctor or pharmacy?: 1 - Never What is the last grade level you completed in school?: Bachelors degree  Interpreter Needed?: No  Comments: pt lives with spouse Information entered by :: LPinson, LPN  Past Medical History:  Diagnosis Date  . Allergic  rhinitis   . Arthritis    Gout  . BPH (benign prostatic hypertrophy)   . Bradycardia   . CAD (coronary artery disease)    nonobstructive  . Chronic anemia 2013   h/o IDA, did not tolerate oral iron, latest normal iron studies, B12, folate  . Diverticulosis of colon    conoscopy 12/1993 (Dr. Amedeo Plenty) S/G divertics//colonoscopy L&R divertics (Dr. Amedeo Plenty) 11/08/2004  . Dizziness   . Gout   . HTN (hypertension)   . Memory loss   . Mitral regurgitation    Pulmonic regurgitation, with marked LA enlargement  . Nonischemic cardiomyopathy (HCC)    EF 40%  . Orthostatic hypotension   . PVC (premature ventricular contraction)   . PVD (peripheral vascular disease) ([place])    followed by Dr Trula Slade, treating medically (pletal and aspirin)  . RBBB longstanding  . Squamous cell carcinoma in situ of skin 2015   L ear  . Syncope    Past Surgical History:  Procedure Laterality Date  . 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 APPENDECTOMY    . dexa  2004   "shrunk 2 inches", WNL   Family History  Problem Relation Age of Onset  . Heart disease Mother 15       Natural causes with CHF  . Hypertension Mother   . Hypertension Father   . Stroke Father        cerebral hemorrhage  . Hyperlipidemia Brother   .  Heart disease Brother        before age 14  . Hypertension Brother   . Heart attack Brother   . Hypertension Unknown    Social History   Socioeconomic History  . Marital status: Married    Spouse name: Not on file  . Number of children: Not on file  . Years of education: Not on file  . Highest education level: Not on file  Occupational History  . Not on file  Social Needs  . Financial resource strain: Not on file  . Food insecurity:    Worry: Not on file    Inability: Not on file  . Transportation needs:    Medical: Not on file    Non-medical: Not on file  Tobacco Use  . Smoking status: Former Smoker    Packs/day: 2.00    Types: Cigarettes    Last attempt to quit:  11/10/1982    Years since quitting: 34.8  . Smokeless tobacco: Former Network engineer and Sexual Activity  . Alcohol use: Yes    Alcohol/week: 4.2 oz    Types: 7 Glasses of wine per week    Comment: 1 glass of wine daily. History of heavy drinking prior to 2005.  . Drug use: No  . Sexual activity: Never  Lifestyle  . Physical activity:    Days per week: Not on file    Minutes per session: Not on file  . Stress: Not on file  Relationships  . Social connections:    Talks on phone: Not on file    Gets together: Not on file    Attends religious service: Not on file    Active member of club or organization: Not on file    Attends meetings of clubs or organizations: Not on file    Relationship status: Not on file  Other Topics Concern  . Not on file  Social History Narrative   Lives with wife [name]). Dog passed away 12/02/2012.   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 HCPOA is wife then son and then oldest daughter etc. Advanced directives in chart 02-Dec-2012). Does not want artificial nutrition/hydration or prolonged life support      Does have stairs in home. BS Degree    Outpatient Encounter Medications as of 09/16/2017  Medication Sig  . cholecalciferol (VITAMIN D) 1000 units tablet Take 1,000 Units by mouth daily.  Marland Kitchen dipyridamole-aspirin (AGGRENOX) 200-25 MG 12hr capsule take 1 capsule by mouth twice a day  . fludrocortisone (FLORINEF) 0.1 MG tablet take 1 tablet by mouth twice a day  . pravastatin (PRAVACHOL) 20 MG tablet Take 1 tablet (20 mg total) by mouth daily.  . vitamin B-12 (CYANOCOBALAMIN) 500 MCG tablet Take 500 mcg by mouth daily.   No facility-administered encounter medications on file as of 09/16/2017.     Activities of  Daily Living In your present state of health, do you have any difficulty performing the following activities: 09/16/2017  Hearing? Y  Vision? N  Difficulty concentrating or making decisions? N  Walking or climbing stairs? N  Dressing or bathing? N  Doing errands, shopping? N  Preparing Food and eating ? N  Using the Toilet? N  In the past six months, have you accidently leaked urine?  N  Do you have problems with loss of bowel control? N  Managing your Medications? N  Managing your Finances? N  Housekeeping or managing your Housekeeping? N  Some recent data might be hidden    Patient Care Team: Ria Bush, MD as PCP - General (Family Medicine)   Assessment:   This is a routine wellness examination for Denilson.  Hearing Screening Comments: Bilateral hearing aids Vision Screening Comments: Vision exam in May 2019   Exercise Activities and Dietary recommendations Current Exercise Habits: Home exercise routine, Type of exercise: strength training/weights;treadmill, Time (Minutes): 60, Frequency (Times/Week): 3, Weekly Exercise (Minutes/Week): 180, Intensity: Mild, Exercise limited by: None identified  Goals    . Patient Stated     Starting 09/16/17, I will continue to exercise for at least 60-70 minutes 3 days per week.        Fall Risk Fall Risk  09/16/2017 01/05/2017 09/11/2016 04/07/2016 02/06/2016  Falls in the past year? No No No No Yes  Number falls in past yr: - - - - 1  Injury with Fall? - - - - -  Follow up - - - - -   Depression Screen PHQ 2/9 Scores 09/16/2017 09/11/2016 08/21/2015 12/12/2013  PHQ - 2 Score 0 0 0 0  PHQ- 9 Score 0 - - -    Cognitive Function MMSE - Mini Mental State Exam 09/16/2017 09/11/2016 02/20/2015  Orientation to time 5 5 5   Orientation to Place 5 5 5   Registration 3 3 3   Attention/ Calculation 0 0 5  Recall 3 3 1   Language- name 2 objects 0 0 2  Language- repeat 1 1 1   Language- follow 3 step command 3 3 3   Language- read & follow  direction 0 0 1  Write a sentence 0 0 1  Copy design 0 0 1  Total score 20 20 28    Montreal Cognitive Assessment  06/04/2015  Visuospatial/ Executive (0/5) 4  Naming (0/3) 3  Attention: Read list of digits (0/2) 2  Attention: Read list of letters (0/1) 1  Attention: Serial 7 subtraction starting at 100 (0/3) 3  Language: Repeat phrase (0/2) 2  Language : Fluency (0/1) 1  Abstraction (0/2) 2  Delayed Recall (0/5) 2  Orientation (0/6) 6  Total 26  Adjusted Score (based on education) 26     PLEASE NOTE: A Mini-Cog screen was completed. Maximum score is 20. A value of 0 denotes this part of [place] MMSE was not completed or the patient failed this part of the Mini-Cog screening.   Mini-Cog Screening Orientation to Time - Max 5 pts Orientation to Place - Max 5 pts Registration - Max 3 pts Recall - Max 3 pts Language Repeat - Max 1 pts Language Follow 3 Step Command - Max 3 pts  Immunization History  Administered Date(s) Administered  . Influenza Whole 12/30/2007  . Influenza, High Dose Seasonal PF 11/27/2014  . Influenza,inj,Quad PF,6+ Mos 12/03/2012, 12/12/2013  . Influenza-Unspecified 12/18/2015, 11/28/2016  . Pneumococcal Conjugate-13 12/12/2013  . Pneumococcal Polysaccharide-23 06/13/2002  . Td 09/21/2005, 03/24/2010  . Zoster 12/30/2007  . Zoster Recombinat (Shingrix) 11/28/2016   Screening Tests Health Maintenance  Topic Date Due  . DTaP/Tdap/Td (1 - Tdap) 03/24/2020 (Originally 03/25/2010)  . INFLUENZA VACCINE  10/22/2017  . TETANUS/TDAP  03/24/2020  . PNA vac Low Risk Adult  Completed      Plan:     I have personally reviewed, addressed, and noted the following in the patient's chart:  A. Medical and social history B. Use of alcohol, tobacco or illicit drugs  C. Current medications and supplements D. Functional ability and status E.  Nutritional status F.  Physical activity G. Advance directives H. List of other physicians I.  Hospitalizations,  surgeries, and ER visits in previous 12 months J.  Benld to include hearing, vision, cognitive, depression L. Referrals and appointments - none  In addition, I have reviewed and discussed with patient certain preventive protocols, quality metrics, and best practice recommendations. A written personalized care plan for preventive services as well as general preventive health recommendations were provided to patient.  See attached scanned questionnaire for additional information.   Signed,   Lindell Noe, MHA, BS, LPN Health Coach

## 2017-09-16 NOTE — Patient Instructions (Signed)
Adam Clements , Thank you for taking time to come for your Medicare Wellness Visit. I appreciate your ongoing commitment to your health goals. Please review the following plan we discussed and let me know if I can assist you in the future.   These are the goals we discussed: Goals    . Patient Stated     Starting 09/16/17, I will continue to exercise for at least 60-70 minutes 3 days per week.        This is a list of the screening recommended for you and due dates:  Health Maintenance  Topic Date Due  . DTaP/Tdap/Td vaccine (1 - Tdap) 03/24/2020*  . Flu Shot  10/22/2017  . Tetanus Vaccine  03/24/2020  . Pneumonia vaccines  Completed  *Topic was postponed. The date shown is not the original due date.   Preventive Care for Adults  A healthy lifestyle and preventive care can promote health and wellness. Preventive health guidelines for adults include the following key practices.  . A routine yearly physical is a good way to check with your health care provider about your health and preventive screening. It is a chance to share any concerns and updates on your health and to receive a thorough exam.  . Visit your dentist for a routine exam and preventive care every 6 months. Brush your teeth twice a day and floss once a day. Good oral hygiene prevents tooth decay and gum disease.  . The frequency of eye exams is based on your age, health, family medical history, use  of contact lenses, and other factors. Follow your health care provider's recommendations for frequency of eye exams.  . Eat a healthy diet. Foods like vegetables, fruits, whole grains, low-fat dairy products, and lean protein foods contain the nutrients you need without too many calories. Decrease your intake of foods high in solid fats, added sugars, and salt. Eat the right amount of calories for you. Get information about a proper diet from your health care provider, if necessary.  . Regular physical exercise is one of the  most important things you can do for your health. Most adults should get at least 150 minutes of moderate-intensity exercise (any activity that increases your heart rate and causes you to sweat) each week. In addition, most adults need muscle-strengthening exercises on 2 or more days a week.  Silver Sneakers may be a benefit available to you. To determine eligibility, you may visit the website: www.silversneakers.com or contact program at 563-508-6530 Mon-Fri between 8AM-8PM.   . Maintain a healthy weight. The body mass index (BMI) is a screening tool to identify possible weight problems. It provides an estimate of body fat based on height and weight. Your health care provider can find your BMI and can help you achieve or maintain a healthy weight.   For adults 20 years and older: ? A BMI below 18.5 is considered underweight. ? A BMI of 18.5 to 24.9 is normal. ? A BMI of 25 to 29.9 is considered overweight. ? A BMI of 30 and above is considered obese.   . Maintain normal blood lipids and cholesterol levels by exercising and minimizing your intake of saturated fat. Eat a balanced diet with plenty of fruit and vegetables. Blood tests for lipids and cholesterol should begin at age 37 and be repeated every 5 years. If your lipid or cholesterol levels are high, you are over 50, or you are at high risk for heart disease, you may need your cholesterol levels  checked more frequently. Ongoing high lipid and cholesterol levels should be treated with medicines if diet and exercise are not working.  . If you smoke, find out from your health care provider how to quit. If you do not use tobacco, please do not start.  . If you choose to drink alcohol, please do not consume more than 2 drinks per day. One drink is considered to be 12 ounces (355 mL) of beer, 5 ounces (148 mL) of wine, or 1.5 ounces (44 mL) of liquor.  . If you are 36-16 years old, ask your health care provider if you should take aspirin to  prevent strokes.  . Use sunscreen. Apply sunscreen liberally and repeatedly throughout the day. You should seek shade when your shadow is shorter than you. Protect yourself by wearing long sleeves, pants, a wide-brimmed hat, and sunglasses year round, whenever you are outdoors.  . Once a month, do a whole body skin exam, using a mirror to look at the skin on your back. Tell your health care provider of new moles, moles that have irregular borders, moles that are larger than a pencil eraser, or moles that have changed in shape or color.

## 2017-09-16 NOTE — Progress Notes (Signed)
PCP notes:   Health maintenance:  No gaps identified.  Abnormal screenings:   None  Patient concerns:   None  Nurse concerns:  None  Next PCP appt:   09/22/17 @ 1030

## 2017-09-16 NOTE — Progress Notes (Signed)
I reviewed health advisor's note, was available for consultation, and agree with documentation and plan.  

## 2017-09-22 ENCOUNTER — Ambulatory Visit: Payer: Medicare Other | Admitting: Family Medicine

## 2017-09-30 ENCOUNTER — Ambulatory Visit (INDEPENDENT_AMBULATORY_CARE_PROVIDER_SITE_OTHER): Payer: Medicare Other | Admitting: Family Medicine

## 2017-09-30 ENCOUNTER — Encounter: Payer: Self-pay | Admitting: Family Medicine

## 2017-09-30 VITALS — BP 162/88 | HR 61 | Temp 97.8°F | Ht 68.5 in | Wt 147.0 lb

## 2017-09-30 DIAGNOSIS — I5022 Chronic systolic (congestive) heart failure: Secondary | ICD-10-CM

## 2017-09-30 DIAGNOSIS — M1A09X0 Idiopathic chronic gout, multiple sites, without tophus (tophi): Secondary | ICD-10-CM | POA: Diagnosis not present

## 2017-09-30 DIAGNOSIS — T50905A Adverse effect of unspecified drugs, medicaments and biological substances, initial encounter: Secondary | ICD-10-CM | POA: Diagnosis not present

## 2017-09-30 DIAGNOSIS — E46 Unspecified protein-calorie malnutrition: Secondary | ICD-10-CM

## 2017-09-30 DIAGNOSIS — I951 Orthostatic hypotension: Secondary | ICD-10-CM | POA: Diagnosis not present

## 2017-09-30 DIAGNOSIS — R5383 Other fatigue: Secondary | ICD-10-CM | POA: Diagnosis not present

## 2017-09-30 DIAGNOSIS — F4489 Other dissociative and conversion disorders: Secondary | ICD-10-CM | POA: Diagnosis not present

## 2017-09-30 DIAGNOSIS — I739 Peripheral vascular disease, unspecified: Secondary | ICD-10-CM

## 2017-09-30 DIAGNOSIS — I158 Other secondary hypertension: Secondary | ICD-10-CM | POA: Diagnosis not present

## 2017-09-30 DIAGNOSIS — D509 Iron deficiency anemia, unspecified: Secondary | ICD-10-CM

## 2017-09-30 DIAGNOSIS — I6523 Occlusion and stenosis of bilateral carotid arteries: Secondary | ICD-10-CM

## 2017-09-30 MED ORDER — ASPIRIN-DIPYRIDAMOLE ER 25-200 MG PO CP12: 1.0000 | ORAL_CAPSULE | Freq: Two times a day (BID) | ORAL | 11 refills | Status: DC

## 2017-09-30 NOTE — Progress Notes (Signed)
BP (!) 162/88 (BP Location: Right Arm, Patient Position: Sitting, Cuff Size: Normal)   Pulse 61   Temp 97.8 F (36.6 C) (Oral)   Ht 5' 8.5" (1.74 m)   Wt 147 lb (66.7 kg)   SpO2 97%   BMI 22.03 kg/m   R arm 200/100 L arm 190/102 Pt feels very well today.- just returned from the gym.  CC: AMW f/u visit Subjective:    Patient ID: Adam Clements, male    DOB: 02-Dec-1934, 83 y.o.   MRN: [id number]  HPI: Adam Clements is a 83 y.o. male presenting on 09/30/2017 for Annual Exam (Pt 2. Pt states he just came from the gym and that may be the cause of elevated BP today.)   Saw Lesia last week for medicare wellness visit. Note reviewed.    Referred to eye doctor last visit for intermittent L eye blind spots (arc on left visual field) - had benign eye exam last month.   Spends significant portion of time outside - has had several tick bites recently, denies tick fever symptoms.  Struggles with ongoing fatigue, lightheadedness, lack of stamina.  Orthostatic hypotension - managed with florinef 0.1mg  BID.   Preventative: Colonoscopy - 2006 by Dr. Amedeo Plenty - R and L sided diverticulosis. Not interested in repeat at this time. Decided to age out. Saw GI 2015 2/2 weight loss and IDA - decided to monitor with stool kits, has not done one recentty. Slow release iron caused stomach upset.  Prostate screening - normal PSA in the past. H/o BPH. Age out.  DEXA 2004 WNL.  Flu shot - yearly  Pneumovax 2004, prevnar 2015 Td 2012  zostavax 2009  shingrix - 11/2016 - overdue for 2nd shot - advised he check with local pharmacy Advanced directives: has living will at home -done through attorney. Wants HCPOA to be wife then son and then oldest daughter etc. Advanced directives in chart (10/2012). Does not want artificial nutrition/hydration or prolonged life support Seat belt use discussed.  Sunscreen use discussed. No changing moles on skin. Sees derm regularly.  Non smoker Alcohol - 1 glass  wine/day Dentist - upper and lower dentures Eye exam - last month  Lives with wife [name]).  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 Interim medical history since our last visit reviewed. Allergies and medications reviewed and updated. Outpatient Medications Prior to Visit  Medication Sig Dispense Refill  . cholecalciferol (VITAMIN D) 1000 units tablet Take 1,000 Units by mouth daily.    . fludrocortisone (FLORINEF) 0.1 MG tablet Take 1 tablet in the morning, 1/2 tablet at night    . pravastatin (PRAVACHOL) 20 MG tablet Take 1 tablet (20 mg total) by mouth daily. 30 tablet 11  . vitamin B-12 (CYANOCOBALAMIN) 500 MCG tablet Take 500 mcg by mouth daily.    Marland Kitchen dipyridamole-aspirin (AGGRENOX) 200-25 MG 12hr capsule take 1 capsule by mouth twice a day 60 capsule 11  . fludrocortisone (FLORINEF) 0.1 MG tablet take 1 tablet by mouth twice a day 180 tablet 2   No facility-administered medications prior to visit.      Per HPI unless specifically indicated in ROS section below Review of Systems     Objective:    BP (!) 162/88 (BP Location: Right Arm, Patient  Position: Sitting, Cuff Size: Normal)   Pulse 61   Temp 97.8 F (36.6 C) (Oral)   Ht 5' 8.5" (1.74 m)   Wt 147 lb (66.7 kg)   SpO2 97%   BMI 22.03 kg/m   Wt Readings from Last 3 Encounters:  09/30/17 147 lb (66.7 kg)  09/16/17 146 lb 8 oz (66.5 kg)  08/18/17 146 lb 12 oz (66.6 kg)    Physical Exam  Constitutional: He is oriented to person, place, and time. He appears well-developed and well-nourished. No distress.  HENT:  Head: Normocephalic and atraumatic.  Right Ear: Hearing, tympanic membrane, external ear and ear canal normal.  Left Ear: Hearing, tympanic  membrane, external ear and ear canal normal.  Nose: Nose normal.  Mouth/Throat: Uvula is midline, oropharynx is clear and moist and mucous membranes are normal. No oropharyngeal exudate, posterior oropharyngeal edema or posterior oropharyngeal erythema.  Eyes: Pupils are equal, round, and reactive to light. Conjunctivae and EOM are normal. No scleral icterus.  Neck: Normal range of motion. Neck supple. Carotid bruit is not present. No thyromegaly present.  Cardiovascular: Normal rate, regular rhythm, normal heart sounds and intact distal pulses.  No murmur heard. Pulses:      Radial pulses are 2+ on the right side, and 2+ on the left side.  Pulmonary/Chest: Effort normal and breath sounds normal. No respiratory distress. He has no wheezes. He has no rales.  Abdominal: Soft. Bowel sounds are normal. He exhibits no distension and no mass. There is no tenderness. There is no rebound and no guarding.  Musculoskeletal: Normal range of motion. He exhibits no edema.  Lymphadenopathy:    He has no cervical adenopathy.  Neurological: He is alert and oriented to person, place, and time.  CN grossly intact, station and gait intact  Skin: Skin is warm and dry. No rash noted.  Psychiatric: He has a normal mood and affect. His behavior is normal. Judgment and thought content normal.  Nursing note and vitals reviewed.  Results for orders placed or performed in visit on 09/16/17  Ferritin  Result Value Ref Range   Ferritin 50.1 22.0 - 322.0 ng/mL  CBC with Differential/Platelet  Result Value Ref Range   WBC 3.9 (L) 4.0 - 10.5 K/uL   RBC 3.54 (L) 4.22 - 5.81 Mil/uL   Hemoglobin 11.8 (L) 13.0 - 17.0 g/dL   HCT 33.6 (L) 39.0 - 52.0 %   MCV 94.9 78.0 - 100.0 fl   MCHC 35.1 30.0 - 36.0 g/dL   RDW 12.9 11.5 - 15.5 %   Platelets 187.0 150.0 - 400.0 K/uL   Neutrophils Relative % 60.5 43.0 - 77.0 %   Lymphocytes Relative 21.6 12.0 - 46.0 %   Monocytes Relative 12.0 3.0 - 12.0 %   Eosinophils Relative 5.4  (H) 0.0 - 5.0 %   Basophils Relative 0.5 0.0 - 3.0 %   Neutro Abs 2.3 1.4 - 7.7 K/uL   Lymphs Abs 0.8 0.7 - 4.0 K/uL   Monocytes Absolute 0.5 0.1 - 1.0 K/uL   Eosinophils Absolute 0.2 0.0 - 0.7 K/uL   Basophils Absolute 0.0 0.0 - 0.1 K/uL  Uric acid  Result Value Ref Range   Uric Acid, Serum 4.5 4.0 - 7.8 mg/dL  Comprehensive metabolic panel  Result Value Ref Range   Sodium 139 135 - 145 mEq/L   Potassium 4.0 3.5 - 5.1 mEq/L   Chloride 104 96 - 112 mEq/L   CO2 30 19 - 32 mEq/L   Glucose,  Bld 88 70 - 99 mg/dL   BUN 22 6 - 23 mg/dL   Creatinine, Ser 1.06 0.40 - 1.50 mg/dL   Total Bilirubin 0.7 0.2 - 1.2 mg/dL   Alkaline Phosphatase 43 39 - 117 U/L   AST 16 0 - 37 U/L   ALT 13 0 - 53 U/L   Total Protein 5.7 (L) 6.0 - 8.3 g/dL   Albumin 3.4 (L) 3.5 - 5.2 g/dL   Calcium 8.8 8.4 - 10.5 mg/dL   GFR 71.20 >60.00 mL/min  Lipid panel  Result Value Ref Range   Cholesterol 137 0 - 200 mg/dL   Triglycerides 34.0 0.0 - 149.0 mg/dL   HDL 58.60 >39.00 mg/dL   VLDL 6.8 0.0 - 40.0 mg/dL   LDL Cholesterol 71 0 - 99 mg/dL   Total CHOL/HDL Ratio 2    NonHDL 78.12       Assessment & Plan:   Problem List Items Addressed This Visit    Syncope due to orthostatic hypotension    No recent episodes - last >1 yr ago      Protein malnutrition ([place])    Discussed recent low protein levels on blood work. He had backed off supplements - encouraged restart ensure or boost and advised him to ensure a source of protein with each meal.       PAD (peripheral artery disease) ([place])    Pletal may have contributed to orthostasis/dizziness. Now off this medicine for >6 months, PAD symptoms stable off med.  He does continue aggrenox and pravastatin daily.      Orthostatic hypotension - Primary    With chronic dysautonomia. Managed on florinef 0.1mg  bid however markedly elevated BP readings noted today (sitting) - will recommend he decrease florinef to 0.1mg  1 tab in AM and 1/2 tab in PM. Of note, he has  felt better with recent increased florinef 0.1mg  bid - will need to closely monitor effect of lower dose at night time.       IDA (iron deficiency anemia)    Mild, stable. Chronic. Previously declined GI eval.       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 Will monitor for effect.       HFrEF (heart failure with reduced ejection fraction) ([place])   GOUT    Carries this diagnosis. Chronic, stable off medication. No recent gout flares. Uric acid normal.       Decreased stamina    Ongoing struggle regarding decreased stamina related to orthostatic dizziness.  He did go to gym today, he does regularly work at [employer] for [employer].       Confusional state    Neurocognitive evaluation Larena Glassman Bailar): Mild vascular cognitive impairment, with mild decline since testing in 03/2015 (06/2016).          Meds ordered this encounter  Medications  . dipyridamole-aspirin (AGGRENOX) 200-25 MG 12hr capsule    Sig: Take 1 capsule by mouth 2 (two) times daily.    Dispense:  60 capsule    Refill:  11   No orders of the defined types were placed in this encounter.   Follow up plan: Return in about 6 months (around 04/02/2018) for follow up visit.  Ria Bush, MD

## 2017-09-30 NOTE — Patient Instructions (Addendum)
Go to pharmacy for second shingrix shot (overdue).  Labs were stable to improved this year Protein levels were low - work on nutrition - ensure a protein with each meal, consider restarting boost or ensure supplements.  Blood pressure too high today - decrease florinef to 1 tablet in the morning, 1/2 at night. Monitor blood pressures at home.  Good to see you today. Call us with questions. Return as needed or in 6 months for follow up visit.   Health Maintenance, Male A healthy lifestyle and preventive care is important for your health and wellness. Ask your health care provider about what schedule of regular examinations is right for you. What should I know about weight and diet? Eat a Healthy Diet  Eat plenty of vegetables, fruits, whole grains, low-fat dairy products, and lean protein.  Do not eat a lot of foods high in solid fats, added sugars, or salt.  Maintain a Healthy Weight Regular exercise can help you achieve or maintain a healthy weight. You should:  Do at least 150 minutes of exercise each week. The exercise should increase your heart rate and make you sweat (moderate-intensity exercise).  Do strength-training exercises at least twice a week.  Watch Your Levels of Cholesterol and Blood Lipids  Have your blood tested for lipids and cholesterol every 5 years starting at 81 years of age. If you are at high risk for heart disease, you should start having your blood tested when you are 81 years old. You may need to have your cholesterol levels checked more often if: ? Your lipid or cholesterol levels are high. ? You are older than 80 years of age. ? You are at high risk for heart disease.  What should I know about cancer screening? Many types of cancers can be detected early and may often be prevented. Lung Cancer  You should be screened every year for lung cancer if: ? You are a current smoker who has smoked for at least 30 years. ? You are a former smoker who has quit  within the past 15 years.  Talk to your health care provider about your screening options, when you should start screening, and how often you should be screened.  Colorectal Cancer  Routine colorectal cancer screening usually begins at 81 years of age and should be repeated every 5-10 years until you are 81 years old. You may need to be screened more often if early forms of precancerous polyps or small growths are found. Your health care provider may recommend screening at an earlier age if you have risk factors for colon cancer.  Your health care provider may recommend using home test kits to check for hidden blood in the stool.  A small camera at the end of a tube can be used to examine your colon (sigmoidoscopy or colonoscopy). This checks for the earliest forms of colorectal cancer.  Prostate and Testicular Cancer  Depending on your age and overall health, your health care provider may do certain tests to screen for prostate and testicular cancer.  Talk to your health care provider about any symptoms or concerns you have about testicular or prostate cancer.  Skin Cancer  Check your skin from head to toe regularly.  Tell your health care provider about any new moles or changes in moles, especially if: ? There is a change in a mole's size, shape, or color. ? You have a mole that is larger than a pencil eraser.  Always use sunscreen. Apply sunscreen liberally and  repeat throughout the day.  Protect yourself by wearing long sleeves, pants, a wide-brimmed hat, and sunglasses when outside.  What should I know about heart disease, diabetes, and high blood pressure?  If you are 64-70 years of age, have your blood pressure checked every 3-5 years. If you are 48 years of age or older, have your blood pressure checked every year. You should have your blood pressure measured twice-once when you are at a hospital or clinic, and once when you are not at a hospital or clinic. Record the average  of the two measurements. To check your blood pressure when you are not at a hospital or clinic, you can use: ? An automated blood pressure machine at a pharmacy. ? A home blood pressure monitor.  Talk to your health care provider about your target blood pressure.  If you are between 10-32 years old, ask your health care provider if you should take aspirin to prevent heart disease.  Have regular diabetes screenings by checking your fasting blood sugar level. ? If you are at a normal weight and have a low risk for diabetes, have this test once every three years after the age of 68. ? If you are overweight and have a high risk for diabetes, consider being tested at a younger age or more often.  A one-time screening for abdominal aortic aneurysm (AAA) by ultrasound is recommended for men aged 50-75 years who are current or former smokers. What should I know about preventing infection? Hepatitis B If you have a higher risk for hepatitis B, you should be screened for this virus. Talk with your health care provider to find out if you are at risk for hepatitis B infection. Hepatitis C Blood testing is recommended for:  Everyone born from [birth year] through 1963.  Anyone with known risk factors for hepatitis C.  Sexually Transmitted Diseases (STDs)  You should be screened each year for STDs including gonorrhea and chlamydia if: ? You are sexually active and are younger than 81 years of age. ? You are older than 81 years of age and your health care provider tells you that you are at risk for this type of infection. ? Your sexual activity has changed since you were last screened and you are at an increased risk for chlamydia or gonorrhea. Ask your health care provider if you are at risk.  Talk with your health care provider about whether you are at high risk of being infected with HIV. Your health care provider may recommend a prescription medicine to help prevent HIV infection.  What else can I  do?  Schedule regular health, dental, and eye exams.  Stay current with your vaccines (immunizations).  Do not use any tobacco products, such as cigarettes, chewing tobacco, and e-cigarettes. If you need help quitting, ask your health care provider.  Limit alcohol intake to no more than 2 drinks per day. One drink equals 12 ounces of beer, 5 ounces of wine, or 1 ounces of hard liquor.  Do not use street drugs.  Do not share needles.  Ask your health care provider for help if you need support or information about quitting drugs.  Tell your health care provider if you often feel depressed.  Tell your health care provider if you have ever been abused or do not feel safe at home. This information is not intended to replace advice given to you by your health care provider. Make sure you discuss any questions you have with your health care  provider. Document Released: 09/06/2007 Document Revised: 11/07/2015 Document Reviewed: 12/12/2014 Elsevier Interactive Patient Education  [employer].

## 2017-10-01 DIAGNOSIS — I158 Other secondary hypertension: Secondary | ICD-10-CM | POA: Insufficient documentation

## 2017-10-01 DIAGNOSIS — T50905A Adverse effect of unspecified drugs, medicaments and biological substances, initial encounter: Secondary | ICD-10-CM

## 2017-10-01 NOTE — Assessment & Plan Note (Addendum)
Carries this diagnosis. Chronic, stable off medication. No recent gout flares. Uric acid normal.

## 2017-10-01 NOTE — Assessment & Plan Note (Signed)
No recent episodes - last >1 yr ago

## 2017-10-01 NOTE — Assessment & Plan Note (Signed)
Discussed recent low protein levels on blood work. He had backed off supplements - encouraged restart ensure or boost and advised him to ensure a source of protein with each meal.

## 2017-10-01 NOTE — Assessment & Plan Note (Signed)
Ongoing struggle regarding decreased stamina related to orthostatic dizziness.  He did go to gym today, he does regularly work at [employer] for [employer].

## 2017-10-01 NOTE — Assessment & Plan Note (Addendum)
With chronic dysautonomia. Managed on florinef 0.1mg  bid however markedly elevated BP readings noted today (sitting) - will recommend he decrease florinef to 0.1mg  1 tab in AM and 1/2 tab in PM. Of note, he has felt better with recent increased florinef 0.1mg  bid - will need to closely monitor effect of lower dose at night time.

## 2017-10-01 NOTE — Assessment & Plan Note (Addendum)
Pletal may have contributed to orthostasis/dizziness. Now off this medicine for >6 months, PAD symptoms stable off med.  He does continue aggrenox and pravastatin daily.

## 2017-10-01 NOTE — Assessment & Plan Note (Signed)
Mild, stable. Chronic. Previously declined GI eval.

## 2017-10-01 NOTE — Assessment & Plan Note (Signed)
Neurocognitive evaluation Larena Glassman Bailar): Mild vascular cognitive impairment, with mild decline since testing in 03/2015 (06/2016).

## 2017-10-01 NOTE — Assessment & Plan Note (Signed)
Florinef related - also coming from gym which may be contributing - regardless, I did ask him to decrease florinef dose to 1 tab in am and 1/2 tab in pm. Will monitor for effect.

## 2017-10-09 ENCOUNTER — Ambulatory Visit: Payer: Self-pay | Admitting: Family Medicine

## 2017-10-09 NOTE — Telephone Encounter (Signed)
Pt called to report dizziness. Pt states that he thinks it is from his dose adjustment of Florinef or the Shingles shot. Pt stated that he was out side working in the hot weather and he had to stop and rest until the dizziness subsided.Pt was not having symptoms at the time of the call. Pt stated that he has had dizziness for a year off and on. Pt has been seen on office several times for this issue. Pt advised to work outdoors early in the am or late in the evening. Drink 8-10 glasses of water per day and elevate legs above the level of the hear when he is having symptoms.  Care advice given and pt verbalized understanding. Offered appt Monday with Clarene Reamer FNP (PCP is on vacation) at 8:00 am.   Reason for Disposition . [1] MODERATE dizziness (e.g., interferes with normal activities) AND [2] has been evaluated by physician for this  Answer Assessment - Initial Assessment Questions 1. DESCRIPTION: "Describe your dizziness."     Feels like he is at the point of passing out  2. LIGHTHEADED: "Do you feel lightheaded?" (e.g., somewhat faint, woozy, weak upon standing)     No  3. VERTIGO: "Do you feel like either you or the room is spinning or tilting?" (i.e. vertigo)     no 4. SEVERITY: "How bad is it?"  "Do you feel like you are going to faint?" "Can you stand and walk?"   - MILD - walking normally   - MODERATE - interferes with normal activities (e.g., work, school)    - SEVERE - unable to stand, requires support to walk, feels like passing out now.      moderate 5. ONSET:  "When did the dizziness begin?"     1 year ago 6. AGGRAVATING FACTORS: "Does anything make it worse?" (e.g., standing, change in head position)     Standing  Working in the heat outdoors,  7. HEART RATE: "Can you tell me your heart rate?" "How many beats in 15 seconds?"  (Note: not all patients can do this)      68 per minute 8. CAUSE: "What do you think is causing the dizziness?" 6-9 months ago increased florinef  from 2 pills to 1 pill in am and 1/2 pill at night Shingles shot hot weather and med adjustments 9. RECURRENT SYMPTOM: "Have you had dizziness before?" If so, ask: "When was the last time?" "What happened that time?"     Yes has had it for a year on and off- med adjustment   10. OTHER SYMPTOMS: "Do you have any other symptoms?" (e.g., fever, chest pain, vomiting, diarrhea, bleeding)     no 11. PREGNANCY: "Is there any chance you are pregnant?" "When was your last menstrual period?" no  Protocols used: DIZZINESS [facility]

## 2017-10-12 ENCOUNTER — Encounter: Payer: Self-pay | Admitting: Family Medicine

## 2017-10-12 ENCOUNTER — Ambulatory Visit (INDEPENDENT_AMBULATORY_CARE_PROVIDER_SITE_OTHER): Payer: Medicare Other | Admitting: Family Medicine

## 2017-10-12 VITALS — BP 90/42 | HR 63 | Temp 97.8°F | Ht 68.5 in | Wt 145.8 lb

## 2017-10-12 DIAGNOSIS — I5022 Chronic systolic (congestive) heart failure: Secondary | ICD-10-CM | POA: Diagnosis not present

## 2017-10-12 DIAGNOSIS — I6523 Occlusion and stenosis of bilateral carotid arteries: Secondary | ICD-10-CM | POA: Diagnosis not present

## 2017-10-12 DIAGNOSIS — R42 Dizziness and giddiness: Secondary | ICD-10-CM

## 2017-10-12 DIAGNOSIS — I951 Orthostatic hypotension: Secondary | ICD-10-CM | POA: Diagnosis not present

## 2017-10-12 LAB — BASIC METABOLIC PANEL
BUN: 27 mg/dL — AB (ref 6–23)
CHLORIDE: 106 meq/L (ref 96–112)
CO2: 27 mEq/L (ref 19–32)
CREATININE: 1.16 mg/dL (ref 0.40–1.50)
Calcium: 8.4 mg/dL (ref 8.4–10.5)
GFR: 64.15 mL/min (ref >60.00)
Glucose, Bld: 110 mg/dL — ABNORMAL HIGH (ref 70–99)
Potassium: 4.2 mEq/L (ref 3.5–5.1)
Sodium: 139 mEq/L (ref 135–145)

## 2017-10-12 NOTE — Progress Notes (Signed)
Subjective:    Patient ID: Adam Clements, male    DOB: [birth year], 83 y.o.   MRN: [id number]  HPI This is an 83 yo male who presents today with increased lightheadedness. This has been an ongoing problem for him. He was working outside in extreme heat last week and noticed increased lightheadedness, not sure if related to heat or getting second Shingrix vaccine. He has not had any recent episodes of LOC. When he feels his lightheadedness coming on, he puts his head between his legs (even when standing) and symptoms resolve. He reports significant decrease in ability to perform activities he desires due to symptoms.  He was seen earlier this month by his PCP (10/01/17). His blood pressure was elevated and his florinef 0.1 mg po BID dose was decreased to 1 in am and 1/2 in pm.  Blood pressures at home have been running 110/60 and below. Unsure how much liquid he is drinking. Does drink an electrolyte replacement in small amounts.  Last seen by cardiology ([facility]) 10/18 and per note was to follow up in 3 months with EP PA and if no improvement in symptoms, he was to see Dr. Caryl Comes. Patient states he was not aware of these recommendations and no appointments were made. His wife sees Dr. Caryl Comes and has been trying to get him to see Dr. Caryl Comes for an additional opinion.   About 1.5 weeks ago was working at [employer] and wasn't feeling fell, he got in his car and didin't recognize where he was. Episode lasted about 10 minutes and went away. Had prior episode that was same about 1-1.5 years ago.  Appetite ok, sleeping ok.   Past Medical History:  Diagnosis Date  . Allergic rhinitis   . Arthritis    Gout  . BPH (benign prostatic hypertrophy)   . Bradycardia   . CAD (coronary artery disease)    nonobstructive  . Chronic anemia 2013   h/o IDA, did not tolerate oral iron, latest normal iron studies, B12, folate  . Diverticulosis of colon    conoscopy 12/1993 (Dr. Amedeo Plenty) S/G divertics//colonoscopy L&R  divertics (Dr. Amedeo Plenty) 11/08/2004  . Dizziness   . Gout   . HTN (hypertension)   . Memory loss   . Mitral regurgitation    Pulmonic regurgitation, with marked LA enlargement  . Nonischemic cardiomyopathy (HCC)    EF 40%  . Orthostatic hypotension   . PVC (premature ventricular contraction)   . PVD (peripheral vascular disease) ([place])    followed by Dr Trula Slade, treating medically (pletal and aspirin)  . RBBB longstanding  . Squamous cell carcinoma in situ of skin 2015   L ear  . Syncope    Past Surgical History:  Procedure Laterality Date  . 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 APPENDECTOMY    . dexa  2004   "shrunk 2 inches", WNL   Family History  Problem Relation Age of Onset  . Heart disease Mother 29       Natural causes with CHF  . Hypertension Mother   . Hypertension Father   . Stroke Father        cerebral hemorrhage  . Hyperlipidemia Brother   . Heart disease Brother        before age 77  . Hypertension Brother   . Heart attack Brother   . Hypertension Unknown    Social History   Tobacco Use  . Smoking status: Former Smoker    Packs/day: 2.00  Types: Cigarettes    Last attempt to quit: 11/10/1982    Years since quitting: 34.9  . Smokeless tobacco: Former Network engineer Use Topics  . Alcohol use: Yes    Alcohol/week: 4.2 oz    Types: 7 Glasses of wine per week    Comment: 1 glass of wine daily. History of heavy drinking prior to 2005.  . Drug use: No      Review of Systems Per HPI    Objective:   Physical Exam  Constitutional: He is oriented to person, place, and time. He appears well-developed and well-nourished. No distress.  HENT:  Head: Normocephalic and atraumatic.  Eyes: Conjunctivae are normal.  Cardiovascular: Normal rate, regular rhythm and normal heart sounds.  Pulmonary/Chest: Effort normal and breath sounds normal.  Musculoskeletal: He exhibits no edema.  Neurological: He is alert and oriented to person, place, and time.   Skin: Skin is warm and dry. He is not diaphoretic.  Psychiatric: He has a normal mood and affect. His behavior is normal. Judgment and thought content normal.  Vitals reviewed.     BP (!) 90/42 (BP Location: Right Arm, Patient Position: Sitting, Cuff Size: Normal)   Pulse 63   Temp 97.8 F (36.6 C) (Oral)   Ht 5' 8.5" (1.74 m)   Wt 145 lb 12 oz (66.1 kg)   SpO2 96%   BMI 21.84 kg/m  Wt Readings from Last 3 Encounters:  10/12/17 145 lb 12 oz (66.1 kg)  09/30/17 147 lb (66.7 kg)  09/16/17 146 lb 8 oz (66.5 kg)   Orthostatic VS for the past 24 hrs:  BP- Lying Pulse- Lying BP- Sitting Pulse- Sitting BP- Standing at 0 minutes Pulse- Standing at 0 minutes  10/12/17 0803 109/55 59 (!) 74/42 64 (!) 66/38 65        Assessment & Plan:  1. Lightheadedness - this has unfortunately been an ongoing problem and patient has not had follow up as recommended by cardiology. Will have him increase his Florinef from 1/2 to 1 tablet daily and check his blood pressures daily and keep a log - Appointment with Dr. Caryl Comes 10/16/17 - Fluid intake sounds inadequate and have encouraged him to drink his electrolyte replacement drink as well as additional fluids - Encouraged him to change positions slowly  - Ambulatory referral to Cardiology - Basic metabolic panel  2. Orthostatic hypotension - Ambulatory referral to Cardiology - Basic metabolic panel  3. Chronic systolic heart failure [facility]) - Ambulatory referral to Cardiology - Basic metabolic panel   Clarene Reamer, FNP-BC  [place] [facility]  10/12/2017 1:21 PM

## 2017-10-12 NOTE — Patient Instructions (Addendum)
Please drink quart of your electrolyte replacement plus at least 4 additional glasses of liquids daily   It is fine for you to have whole milk  Please increase your Florinef to a whole tablet at bedtime  Please check your blood pressure once a day at different times and bring readings to your next appointment

## 2017-10-16 ENCOUNTER — Ambulatory Visit (INDEPENDENT_AMBULATORY_CARE_PROVIDER_SITE_OTHER): Payer: Medicare Other | Admitting: Internal Medicine

## 2017-10-16 ENCOUNTER — Encounter: Payer: Self-pay | Admitting: Internal Medicine

## 2017-10-16 ENCOUNTER — Telehealth: Payer: Self-pay | Admitting: Internal Medicine

## 2017-10-16 VITALS — BP 196/88 | HR 60 | Ht 68.5 in | Wt 148.2 lb

## 2017-10-16 DIAGNOSIS — I6523 Occlusion and stenosis of bilateral carotid arteries: Secondary | ICD-10-CM | POA: Diagnosis not present

## 2017-10-16 DIAGNOSIS — I951 Orthostatic hypotension: Secondary | ICD-10-CM | POA: Diagnosis not present

## 2017-10-16 DIAGNOSIS — I428 Other cardiomyopathies: Secondary | ICD-10-CM

## 2017-10-16 MED ORDER — PYRIDOSTIGMINE BROMIDE 60 MG PO TABS: 30.0000 mg | ORAL_TABLET | Freq: Three times a day (TID) | ORAL | 3 refills | Status: DC

## 2017-10-16 MED ORDER — PYRIDOSTIGMINE BROMIDE 60 MG PO TABS: 30.0000 mg | ORAL_TABLET | Freq: Two times a day (BID) | ORAL | 3 refills | Status: DC

## 2017-10-16 NOTE — Progress Notes (Signed)
ELECTROPHYSIOLOGY OFFICE NOTE  Patient ID: Adam Clements, MRN: [id number], DOB/AGE: [birth year] 83 y.o. Admit date: (Not on file) Date of Consult: 10/16/2017  Primary Physician: Ria Bush, MD Primary Cardiologist: Adam Clements is a 83 y.o. male who is being seen today for the evaluation of orthostatic hypotension at the request of himself and wife.    HPI Adam Clements is a 83 y.o. male seen today because of profound orthostasis with lightheadedness and fatigue.  It is somewhat delayed occurring a few minutes after standing.  He has had syncope.    His orthostatic hypotension has been managed for a few years with Florinef. He is also has significant problems with systolic hypertension.  Recent efforts to mitigate his symptoms included  the further up titration of Florinef.  This has reduced his hypotension some but his systolic supine blood pressures over 190.  He does not have a history of hypertension.He does not have problems with bowel function.  Bladder function is normal.  No dry eyes but does have some dry mouth.  Denies exercise intolerance  He is not aware of why he is on dual antiplatelet therapy; review of his chart identifies a prior TIA with expressive a aphasia which she subsequently acknowledges.   DATE TEST EF   6/17 MYOVIEW  36 %   4/17 Echo   45-50% LAE severe        i    Past Medical History:  Diagnosis Date  . Allergic rhinitis   . Arthritis    Gout  . BPH (benign prostatic hypertrophy)   . Bradycardia   . CAD (coronary artery disease)    nonobstructive  . Chronic anemia 2013   h/o IDA, did not tolerate oral iron, latest normal iron studies, B12, folate  . Diverticulosis of colon    conoscopy 12/1993 (Dr. Amedeo Plenty) S/G divertics//colonoscopy L&R divertics (Dr. Amedeo Plenty) 11/08/2004  . Dizziness   . Gout   . HTN (hypertension)   . Memory loss   . Mitral regurgitation    Pulmonic regurgitation, with marked LA enlargement  .  Nonischemic cardiomyopathy (HCC)    EF 40%  . Orthostatic hypotension   . PVC (premature ventricular contraction)   . PVD (peripheral vascular disease) ([place])    followed by Dr Trula Slade, treating medically (pletal and aspirin)  . RBBB longstanding  . Squamous cell carcinoma in situ of skin 2015   L ear  . Syncope       Surgical History:  Past Surgical History:  Procedure Laterality Date  . 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 APPENDECTOMY    . dexa  2004   "shrunk 2 inches", WNL     Home Meds: Prior to Admission medications   Medication Sig Start Date End Date Taking? Authorizing Provider  cholecalciferol (VITAMIN D) 1000 units tablet Take 1,000 Units by mouth daily.   Yes [provider]  dipyridamole-aspirin (AGGRENOX) 200-25 MG 12hr capsule Take 1 capsule by mouth 2 (two) times daily. 09/30/17  Yes Ria Bush, MD  fludrocortisone (FLORINEF) 0.1 MG tablet Take 0.1 mg by mouth 2 (two) times daily.  09/30/17  Yes Ria Bush, MD  pravastatin (PRAVACHOL) 20 MG tablet Take 1 tablet (20 mg total) by mouth daily. 08/18/17  Yes Ria Bush, MD  vitamin B-12 (CYANOCOBALAMIN) 500 MCG tablet Take 500 mcg by mouth daily.   Yes [provider]    Allergies:  Allergies  Allergen  Reactions  . Atorvastatin Other (See Comments)    Memory trouble, confusion, fatigue  . Pletal [Cilostazol]     Social History   Socioeconomic History  . Marital status: Married    Spouse name: Not on file  . Number of children: Not on file  . Years of education: Not on file  . Highest education level: Not on file  Occupational History  . Not on file  Social Needs  . Financial resource strain: Not on file  . Food insecurity:    Worry: Not on file    Inability: Not on file  . Transportation needs:    Medical: Not on file    Non-medical: Not on file  Tobacco Use  . Smoking status: Former Smoker    Packs/day: 2.00    Types: Cigarettes    Last attempt to  quit: 11/10/1982    Years since quitting: 34.9  . Smokeless tobacco: Former Network engineer and Sexual Activity  . Alcohol use: Yes    Alcohol/week: 4.2 oz    Types: 7 Glasses of wine per week    Comment: 1 glass of wine daily. History of heavy drinking prior to 2005.  . Drug use: No  . Sexual activity: Never  Lifestyle  . Physical activity:    Days per week: Not on file    Minutes per session: Not on file  . Stress: Not on file  Relationships  . Social connections:    Talks on phone: Not on file    Gets together: Not on file    Attends religious service: Not on file    Active member of club or organization: Not on file    Attends meetings of clubs or organizations: Not on file    Relationship status: Not on file  . Intimate partner violence:    Fear of current or ex partner: Not on file    Emotionally abused: Not on file    Physically abused: Not on file    Forced sexual activity: Not on file  Other Topics Concern  . Not on file  Social History Narrative   Lives with wife [name]). Dog passed away 2012/12/04.   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 HCPOA is wife then son and then oldest daughter etc. Advanced directives in chart 12-04-12). Does not want artificial nutrition/hydration or prolonged life support      Does have stairs in home. BS Degree     Family History  Problem Relation Age of Onset  . Heart disease Mother 38       Natural causes with CHF  . Hypertension Mother   . Hypertension Father   . Stroke Father        cerebral hemorrhage  . Hyperlipidemia Brother   . Heart disease Brother        before age 74  . Hypertension Brother   . Heart attack Brother   . Hypertension Unknown      ROS:  Please see the history  of present illness.     All other systems reviewed and negative.    Physical Exam: Blood pressure (!) 196/88, pulse 60, height 5' 8.5" (1.74 m), weight 148 lb 3.2 oz (67.2 kg), SpO2 95 %. General: Well developed, well nourished  male in no acute distress. Head: Normocephalic, atraumatic, sclera non-icteric, no xanthomas, nares are without discharge. EENT: normal  Lymph Nodes:  none Neck: Negative for carotid bruits. JVD not elevated. Back:without scoliosis kyphosis Lungs: Clear bilaterally to auscultation without wheezes, rales, or rhonchi. Breathing is unlabored. Heart: RRR with S1 S2.  2/6 systolic murmur . No rubs, or gallops appreciated. Abdomen: Soft, non-tender, non-distended with normoactive bowel sounds. No hepatomegaly. No rebound/guarding. No obvious abdominal masses. Msk:  Strength and tone appear normal for age. Extremities: No clubbing or cyanosis. No  edema.  Distal pedal pulses are 2+ and equal bilaterally. Skin: Warm and Dry Neuro: Alert and oriented X 3. CN III-XII intact Grossly normal sensory and motor function . Psych:  Responds to questions appropriately with a normal affect.      Labs: Cardiac Enzymes No results for input(s): CKTOTAL, CKMB, TROPONINI in the last 72 hours. CBC Lab Results  Component Value Date   WBC 3.9 (L) 09/16/2017   HGB 11.8 (L) 09/16/2017   HCT 33.6 (L) 09/16/2017   MCV 94.9 09/16/2017   PLT 187.0 09/16/2017   PROTIME: No results for input(s): LABPROT, INR in the last 72 hours. Chemistry  Recent Labs  Lab 10/12/17 1050  NA 139  K 4.2  CL 106  CO2 27  BUN 27*  CREATININE 1.16  CALCIUM 8.4  GLUCOSE 110*   Lipids Lab Results  Component Value Date   CHOL 137 09/16/2017   HDL 58.60 09/16/2017   LDLCALC 71 09/16/2017   TRIG 34.0 09/16/2017   BNP No results found for: PROBNP Thyroid Function Tests: No results for input(s): TSH, T4TOTAL, T3FREE, THYROIDAB in the last 72 hours.  Invalid input(s): FREET3 Miscellaneous No  results found for: DDIMER  Radiology/Studies:  No results found.  EKG: Sinus rhythm at 60 Intervals 18/16/49 Isolated RBBB PVC   Assessment and Plan:   Hypertension  Orthostatic hypotension I do Cardiomyopathy-nonischemic  Bilateral carotid stenosis  TIA    The patient has severe orthostatic hypotension which is been evident in the old medical record dating back to 2014.  This occurs in the context of not severe systolic hypertension.  This begs the question as to its cause.  ECG does not support a diagnosis of amyloid but the sensitivity of this is known not to be very good.  We will need to further explore amyloid in this elderly gentleman with severe orthostasis.  He does not have symptoms to suggest autonomic failure.  I am impressed however that there was no change in heart rate standing up with standing and 75 mm drop in blood pressure.  For now, we will discontinue the Florinef which would raise systolic blood pressures.  We will begin him on Mestinon 30 mg twice daily to try to mitigate orthostasis.  He will use of compressive wear and hip thighs and abdomen   We will check an echocardiogram to look at LV function again and may need to further evaluate for amyloid is noted  Alternative medications could include ProAmatine and/or Northera.    Virl Axe

## 2017-10-16 NOTE — Telephone Encounter (Signed)
Pharm called slight discrepancies with script   faxed in today.  7/26 please give them a call back.

## 2017-10-16 NOTE — Patient Instructions (Addendum)
Medication Instructions:  Your physician has recommended you make the following change in your medication:   1. Stop Florinef 2. Begin Mestinon, 30mg , half tablet, two times per day  Labwork: None ordered.  Testing/Procedures: Your physician has requested that you have an echocardiogram. Echocardiography is a painless test that uses sound waves to create images of your heart. It provides your doctor with information about the size and shape of your heart and how well your heart's chambers and valves are working. This procedure takes approximately one hour. There are no restrictions for this procedure.   Follow-Up: Your physician wants you to follow-up in: 4 months with Dr Caryl Comes. You will receive a reminder letter in the mail two months in advance. If you don't receive a letter, please call our office to schedule the follow-up appointment.   Any Other Special Instructions Will Be Listed Below (If Applicable).   Begin wearing an abdominal binder and thigh sleeves.   If you need a refill on your cardiac medications before your next appointment, please call your pharmacy.

## 2017-10-19 ENCOUNTER — Ambulatory Visit (HOSPITAL_COMMUNITY): Payer: Medicare Other | Attending: Internal Medicine

## 2017-10-19 ENCOUNTER — Other Ambulatory Visit: Payer: Self-pay

## 2017-10-19 DIAGNOSIS — I951 Orthostatic hypotension: Secondary | ICD-10-CM | POA: Diagnosis not present

## 2017-10-19 DIAGNOSIS — I340 Nonrheumatic mitral (valve) insufficiency: Secondary | ICD-10-CM | POA: Diagnosis not present

## 2017-10-19 DIAGNOSIS — I4510 Unspecified right bundle-branch block: Secondary | ICD-10-CM | POA: Insufficient documentation

## 2017-10-19 DIAGNOSIS — I110 Hypertensive heart disease with heart failure: Secondary | ICD-10-CM | POA: Insufficient documentation

## 2017-10-19 DIAGNOSIS — I2510 Atherosclerotic heart disease of native coronary artery without angina pectoris: Secondary | ICD-10-CM | POA: Diagnosis not present

## 2017-10-19 DIAGNOSIS — G459 Transient cerebral ischemic attack, unspecified: Secondary | ICD-10-CM | POA: Insufficient documentation

## 2017-10-19 DIAGNOSIS — I428 Other cardiomyopathies: Secondary | ICD-10-CM | POA: Insufficient documentation

## 2017-10-19 DIAGNOSIS — I509 Heart failure, unspecified: Secondary | ICD-10-CM | POA: Insufficient documentation

## 2017-10-19 DIAGNOSIS — I429 Cardiomyopathy, unspecified: Secondary | ICD-10-CM | POA: Diagnosis not present

## 2017-10-19 DIAGNOSIS — I739 Peripheral vascular disease, unspecified: Secondary | ICD-10-CM | POA: Insufficient documentation

## 2017-10-21 ENCOUNTER — Telehealth: Payer: Self-pay | Admitting: Internal Medicine

## 2017-10-21 NOTE — Telephone Encounter (Signed)
Pt calls today with concerns on his new medication Mestinon. Pt states in the morning it takes him "longer to get going and get around the barn." He states he does not feel as if he is in danger, he is just not sure the medication is doing what its supposed to be doing. Pt could not tell me what his BP's have been running but he will check them the next several days and take note. I advised pt to, perhaps take his morning medications after his morning chores if his blood pressure was measuring normal. Pt states he would give it a try.  I advised pt that Dr Caryl Comes is out of the office for the next couple of weeks, but I would send him a message for further recommendations.

## 2017-10-21 NOTE — Telephone Encounter (Signed)
Pt calling concerning some lightheadedness he is experiencing. Please call pt.

## 2017-10-22 ENCOUNTER — Encounter: Payer: Self-pay | Admitting: Internal Medicine

## 2017-10-23 NOTE — Telephone Encounter (Signed)
I think we started him on 30 bid   We can try 60 bid for a few days and see how he tolerates it  If doesn't work, we can work on other drugs  aAlso I am reviewind some data to decide re looking for amyloid

## 2017-10-26 NOTE — Telephone Encounter (Signed)
Spoke with pt and gave him Dr Olin Pia recommendation. Pt will begin taking 60mg  bid Mestinon. He will contact us back in about a week to let us know how he is tolerating the dose change.

## 2017-10-27 ENCOUNTER — Telehealth: Payer: Self-pay | Admitting: Internal Medicine

## 2017-10-27 MED ORDER — MIDODRINE HCL 5 MG PO TABS: 5.0000 mg | ORAL_TABLET | Freq: Three times a day (TID) | ORAL | 11 refills | Status: DC

## 2017-10-27 NOTE — Telephone Encounter (Signed)
Follow Up:; ° ° °Returning your call. °

## 2017-10-27 NOTE — Telephone Encounter (Signed)
New Message    STAT if patient feels like he/she is going to faint   1) Are you dizzy now? yes  2) Do you feel faint or have you passed out? no  3) Do you have any other symptoms? Sweating, nausea.  4) Have you checked your HR and BP (record if available)? No     Pt c/o medication issue:  1. Name of Medication: mestinon  2. How are you currently taking this medication (dosage and times per day)?   3. Are you having a reaction (difficulty breathing--STAT)? dizziness  4. What is your medication issue? Patient says that with the change in medication he is feeling worse. More nauseated

## 2017-10-27 NOTE — Telephone Encounter (Signed)
Pt calling today because he feels worse after his increase in Mestinon to 60mg  bid. He states he has increased GI disturbances as well. Per Dr Curt Bears, DOD, pt can stop taking the Mestinon. We will try him on 5mg  Midodrine tid. I have advised pt to keep a record of his Bps in the next week to be sure he does not get hypertensive. Pt has agreed to this plan and will update [place] next week.

## 2017-10-28 NOTE — Telephone Encounter (Signed)
Spoke with pt this morning. He states he is feeling much better! He has not experienced any dizziness or hypotension this morning. I advised pt to trend his BP's the next few weeks in case we need to make medication changes. He agreed he would.   Pt also asked advise about a strange bite on his leg which he described as "white in the middle with some redness; almost 3/4in in diameter." I advised pt to follow up with his PCP asap to be sure this is not a tick bite. Pt verbalized understanding and will contact his PCP.

## 2017-11-03 ENCOUNTER — Telehealth: Payer: Self-pay | Admitting: Internal Medicine

## 2017-11-03 NOTE — Telephone Encounter (Signed)
Spoke with pt's wife who has concerns about pt's memory. She states today he went into the barn to do his morning chores and forgot what he was supposed to be doing. She states this is very unusual for him. I stated that I was planning on calling Mr Hickam to discuss his recent medication change to Midodrine. Pt stated in his MyChart message that he had been taking this bid instead of tid. I advised pt's wife to be sure he was taking the midodrine as directed and wearing his compression wear while up doing his daily activities. I gave pt's wife the phone number to [facility] in [place]. Pt has not been able to find an abdominal binder. I advised pt's wife I would speak to Dr Caryl Comes regarding pt's continued issues with hypotension and call back with further recommendations. In the meantime, it is advised pt take his Midodrine tid. Pt's wife verbalized understanding and had no additional questions.

## 2017-11-03 NOTE — Telephone Encounter (Signed)
New problem    Pt's wife need to speak to nurse concerning circulation issues with pt. Pt is having memory loss. Please call pt's wife

## 2017-11-04 NOTE — Telephone Encounter (Signed)
Spoke with pt today. He states he has begun taking his midodrine 3 times per day. He states he still has to sit and rest, but overall is feeling better today. I advised pt if he was still feeling poor, we could increase his midodrine dosage if needed per Dr Caryl Comes. Pt will call back next week to let us know how he is feeling.

## 2017-11-10 ENCOUNTER — Encounter: Payer: Self-pay | Admitting: Internal Medicine

## 2017-11-10 ENCOUNTER — Ambulatory Visit (INDEPENDENT_AMBULATORY_CARE_PROVIDER_SITE_OTHER): Payer: Medicare Other | Admitting: Internal Medicine

## 2017-11-10 ENCOUNTER — Other Ambulatory Visit
Admission: RE | Admit: 2017-11-10 | Discharge: 2017-11-10 | Disposition: A | Discharge status: Home / Self Care | Payer: Medicare Other | Source: Ambulatory Visit | Attending: Internal Medicine | Admitting: Internal Medicine

## 2017-11-10 ENCOUNTER — Telehealth: Payer: Self-pay | Admitting: Internal Medicine

## 2017-11-10 VITALS — BP 83/46 | HR 61 | Ht 69.0 in | Wt 142.2 lb

## 2017-11-10 DIAGNOSIS — R5383 Other fatigue: Secondary | ICD-10-CM

## 2017-11-10 DIAGNOSIS — E859 Amyloidosis, unspecified: Secondary | ICD-10-CM

## 2017-11-10 DIAGNOSIS — I428 Other cardiomyopathies: Secondary | ICD-10-CM

## 2017-11-10 DIAGNOSIS — I951 Orthostatic hypotension: Secondary | ICD-10-CM | POA: Diagnosis not present

## 2017-11-10 DIAGNOSIS — I6523 Occlusion and stenosis of bilateral carotid arteries: Secondary | ICD-10-CM | POA: Diagnosis not present

## 2017-11-10 LAB — URINALYSIS, COMPLETE (UACMP) WITH MICROSCOPIC
BACTERIA UA: NONE SEEN
Bilirubin Urine: NEGATIVE
Glucose, UA: NEGATIVE mg/dL
Hgb urine dipstick: NEGATIVE
KETONES UR: NEGATIVE mg/dL
Leukocytes, UA: NEGATIVE
Nitrite: NEGATIVE
PROTEIN: NEGATIVE mg/dL
Specific Gravity, Urine: 1.016 (ref 1.005–1.030)
pH: 6 (ref 5.0–8.0)

## 2017-11-10 NOTE — Telephone Encounter (Signed)
New message  Patient wants to setup an appt with Dr. Caryl Comes. I advised the patient that he could not setup appt before mid October. Patient wants to see if he can get in earlier to see Dr. Caryl Comes. Please advise.

## 2017-11-10 NOTE — Progress Notes (Signed)
ELECTROPHYSIOLOGY OFFICE NOTE  Patient ID: Adam Clements, MRN: [id number], DOB/AGE: [birth year] 82 y.o. Admit date: (Not on file) Date of Consult: 11/10/2017  Primary Physician: Adam Bush, MD Primary Cardiologist: Adam Clements is a 83 y.o. male who is being seen today for the evaluation of orthostatic hypotension at the request of himself and wife.    HPI Adam Clements is a 83 y.o. male seen today because of profound orthostasis with lightheadedness and fatigue.  It is somewhat delayed occurring a few minutes after standing.  He has had syncope.      I have concern re amyloid given severity of symptoms  ECG has a max of 50% sensitivity and LVH absent in as many as 1/3   He does not have a history of hypertension.He does not have problems with bowel function.  Bladder function is normal.  No dry eyes but does have some dry mouth. His orthostatic hypotension has been managed for a few years with Florinef.  He is also has significant problems with systolic hypertension.  Recent efforts to mitigate his symptoms included  the further up titration of Florinef.  This has reduced his hypotension some but his systolic supine blood pressures over 190.  We stopped his florinef and tried him on compressive wear and mestinon.  He his wearing support hose but not a binder; now on proamatine, having not tolerated mestinon.    He is not aware of why he is on dual antiplatelet therapy;>> review of his chart identifies a prior TIA with expressive aphasia which he subsequently acknowledges.   DATE TEST EF   6/17 MYOVIEW  36 %   4/17 Echo   45-50% LAE severe        i    Past Medical History:  Diagnosis Date  . Allergic rhinitis   . Arthritis    Gout  . BPH (benign prostatic hypertrophy)   . Bradycardia   . CAD (coronary artery disease)    nonobstructive  . Chronic anemia 2013   h/o IDA, did not tolerate oral iron, latest normal iron studies, B12, folate  .  Diverticulosis of colon    conoscopy 12/1993 (Dr. Amedeo Clements) S/G divertics//colonoscopy L&R divertics (Dr. Amedeo Clements) 11/08/2004  . Dizziness   . Gout   . HTN (hypertension)   . Memory loss   . Mitral regurgitation    Pulmonic regurgitation, with marked LA enlargement  . Nonischemic cardiomyopathy (HCC)    EF 40%  . Orthostatic hypotension   . PVC (premature ventricular contraction)   . PVD (peripheral vascular disease) ([place])    followed by Dr Adam Clements, treating medically (pletal and aspirin)  . RBBB longstanding  . Squamous cell carcinoma in situ of skin 2015   L ear  . Syncope       Surgical History:  Past Surgical History:  Procedure Laterality Date  . 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 APPENDECTOMY    . dexa  2004   "shrunk 2 inches", WNL     Home Meds: Prior to Admission medications   Medication Sig Start Date End Date Taking? Authorizing Provider  cholecalciferol (VITAMIN D) 1000 units tablet Take 1,000 Units by mouth daily.   Yes [provider]  dipyridamole-aspirin (AGGRENOX) 200-25 MG 12hr capsule Take 1 capsule by mouth 2 (two) times daily. 09/30/17  Yes Adam Bush, MD  fludrocortisone (FLORINEF) 0.1 MG tablet Take 0.1 mg by mouth 2 (two) times  daily.  09/30/17  Yes Adam Bush, MD  pravastatin (PRAVACHOL) 20 MG tablet Take 1 tablet (20 mg total) by mouth daily. 08/18/17  Yes Adam Bush, MD  vitamin B-12 (CYANOCOBALAMIN) 500 MCG tablet Take 500 mcg by mouth daily.   Yes [provider]    Allergies:  Allergies  Allergen Reactions  . Atorvastatin Other (See Comments)    Memory trouble, confusion, fatigue  . Pletal [Cilostazol]     Social History   Socioeconomic History  . Marital status: Married    Spouse name: Not on file  . Number of children: Not on file  . Years of education: Not on file  . Highest education level: Not on file  Occupational History  . Not on file  Social Needs  . Financial resource strain: Not  on file  . Food insecurity:    Worry: Not on file    Inability: Not on file  . Transportation needs:    Medical: Not on file    Non-medical: Not on file  Tobacco Use  . Smoking status: Former Smoker    Packs/day: 2.00    Types: Cigarettes    Last attempt to quit: 11/10/1982    Years since quitting: 35.0  . Smokeless tobacco: Former Network engineer and Sexual Activity  . Alcohol use: Yes    Alcohol/week: 7.0 standard drinks    Types: 7 Glasses of wine per week    Comment: 1 glass of wine daily. History of heavy drinking prior to 2005.  . Drug use: No  . Sexual activity: Never  Lifestyle  . Physical activity:    Days per week: Not on file    Minutes per session: Not on file  . Stress: Not on file  Relationships  . Social connections:    Talks on phone: Not on file    Gets together: Not on file    Attends religious service: Not on file    Active member of club or organization: Not on file    Attends meetings of clubs or organizations: Not on file    Relationship status: Not on file  . Intimate partner violence:    Fear of current or ex partner: Not on file    Emotionally abused: Not on file    Physically abused: Not on file    Forced sexual activity: Not on file  Other Topics Concern  . Not on file  Social History Narrative   Lives with wife Adam Clements). Dog passed away 11-26-2012.   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 HCPOA is wife then son and then oldest daughter etc. Advanced directives in chart Nov 26, 2012). Does not want artificial nutrition/hydration or prolonged life support      Does have stairs in home. BS Degree     Family History  Problem Relation Age of Onset  . Heart disease Mother 62       Natural causes with CHF  .  Hypertension Mother   . Hypertension Father   . Stroke Father        cerebral hemorrhage  . Hyperlipidemia Brother   . Heart disease Brother        before age 16  . Hypertension Brother   . Heart attack Brother   .  Hypertension Unknown      ROS:  Please see the history of present illness.     All other systems reviewed and negative.    Physical Exam: Blood pressure (!) 83/46, height 5\' 9"  (1.753 m), weight 142 lb 4 oz (64.5 kg). Well developed and nourished in no acute distress HENT normal Neck supple with JVP-flat Clear Regular rate and rhythm, no murmurs or gallops Abd-soft with active BS No Clubbing cyanosis edema Skin-warm and dry A & Oriented  Grossly normal sensory and motor function NO HR VARIATION WITH INSPIRATION OR VALSALVA      Labs: Cardiac Enzymes No results for input(s): CKTOTAL, CKMB, TROPONINI in the last 72 hours. CBC Lab Results  Component Value Date   WBC 3.9 (L) 09/16/2017   HGB 11.8 (L) 09/16/2017   HCT 33.6 (L) 09/16/2017   MCV 94.9 09/16/2017   PLT 187.0 09/16/2017   PROTIME: No results for input(s): LABPROT, INR in the last 72 hours. Chemistry  No results for input(s): NA, K, CL, CO2, BUN, CREATININE, CALCIUM, PROT, BILITOT, ALKPHOS, ALT, AST, GLUCOSE in the last 168 hours.  Invalid input(s): LABALBU Lipids Lab Results  Component Value Date   CHOL 137 09/16/2017   HDL 58.60 09/16/2017   LDLCALC 71 09/16/2017   TRIG 34.0 09/16/2017   BNP No results found for: PROBNP Thyroid Function Tests: No results for input(s): TSH, T4TOTAL, T3FREE, THYROIDAB in the last 72 hours.  Invalid input(s): FREET3 Miscellaneous No results found for: DDIMER  Radiology/Studies:  No results found.  EKG: Sinus rhythm at 60 Intervals 18/16/49 Isolated RBBB PVC   Assessment and Plan:   Hypertension  Orthostatic hypotension  Cardiomyopathy-nonischemic  Bilateral carotid stenosis  TIA   His orthostasis remains debilitating and  threatening.  I am impressed today that there is no evidence of RR variability with inspiration or Valsalva suggesting poor autonomic innervation of the heart.  With his other systemic symptoms, I wonder if this is impending systemic autonomic failure.  Again the mechanism is begged and so we will look for evidence of amyloid  Strongly recommended use of abd binder and will increase his mitodrine 5>>10 mg tid and have reviewed appropriated dosing intervals  More than 50% of 45 min was spent in counseling related to the above     [employer]

## 2017-11-10 NOTE — Telephone Encounter (Signed)
Pt is on his way to [facility] with appt set up for 0900 per Dr Caryl Comes.

## 2017-11-10 NOTE — Patient Instructions (Addendum)
Medication Instructions: - Your physician recommends that you continue on your current medications as directed. Please refer to the Current Medication list given to you today.  Labwork: - Your physician recommends that you have lab work today: urinalysis, free-lytes chains- Please go to the [place] entrance to the hospital - 1st desk in the right to check in to have this done  Procedures/Testing: - Nuclear medicine scan- Wednesday 11/18/17 at the [facility] office in [place]- arrive at 12:15 pm  - there is no special prep for this  Follow-Up: - Your physician recommends that you schedule a follow-up appointment in: October with Dr. Caryl Comes- in the [place] office.  Any Additional Special Instructions Will Be Listed Below (If Applicable).     If you need a refill on your cardiac medications before your next appointment, please call your pharmacy.

## 2017-11-11 LAB — IFE AND PE, RANDOM URINE
% BETA, URINE: 23.4 %
ALBUMIN, U: 36.1 %
ALPHA 1 URINE: 4.6 %
ALPHA 2 UR: 10.9 %
GAMMA GLOBULIN URINE: 24.9 %
TOTAL PROTEIN, URINE-UPE24: 13.5 mg/dL

## 2017-11-12 LAB — MULTIPLE MYELOMA PANEL, SERUM
ALBUMIN/GLOB SERPL: 1.6 (ref 0.7–1.7)
ALPHA 1: 0.2 g/dL (ref 0.0–0.4)
ALPHA2 GLOB SERPL ELPH-MCNC: 0.7 g/dL (ref 0.4–1.0)
Albumin SerPl Elph-Mcnc: 3.6 g/dL (ref 2.9–4.4)
B-GLOBULIN SERPL ELPH-MCNC: 1 g/dL (ref 0.7–1.3)
GAMMA GLOB SERPL ELPH-MCNC: 0.6 g/dL (ref 0.4–1.8)
GLOBULIN, TOTAL: 2.4 g/dL (ref 2.2–3.9)
IGG (IMMUNOGLOBIN G), SERUM: 791 mg/dL (ref 700–1600)
IgA: 434 mg/dL (ref 61–437)
IgM (Immunoglobulin M), Srm: 21 mg/dL (ref 15–143)
M Protein SerPl Elph-Mcnc: 0.3 g/dL — ABNORMAL HIGH
Total Protein ELP: 6 g/dL (ref 6.0–8.5)

## 2017-11-14 DIAGNOSIS — Z23 Encounter for immunization: Secondary | ICD-10-CM | POA: Diagnosis not present

## 2017-11-16 ENCOUNTER — Telehealth (HOSPITAL_COMMUNITY): Payer: Self-pay | Admitting: *Deleted

## 2017-11-16 NOTE — Telephone Encounter (Signed)
Patient given detailed instructions per Myocardial Amyloid Study Information Sheet for the test on 11/18/17 at 1230. Patient notified to arrive 15 minutes early and that it is imperative to arrive on time for appointment to keep from having the test rescheduled.  If you need to cancel or reschedule your appointment, please call the office within 24 hours of your appointment. . Patient verbalized understanding.Hargun Spurling, Ranae Palms

## 2017-11-18 ENCOUNTER — Ambulatory Visit (HOSPITAL_COMMUNITY): Payer: Medicare Other | Attending: Cardiovascular Disease

## 2017-11-18 DIAGNOSIS — E859 Amyloidosis, unspecified: Secondary | ICD-10-CM | POA: Diagnosis not present

## 2017-11-18 DIAGNOSIS — I10 Essential (primary) hypertension: Secondary | ICD-10-CM | POA: Diagnosis not present

## 2017-11-18 DIAGNOSIS — E8589 Other amyloidosis: Secondary | ICD-10-CM | POA: Diagnosis not present

## 2017-11-18 DIAGNOSIS — R55 Syncope and collapse: Secondary | ICD-10-CM | POA: Insufficient documentation

## 2017-11-18 DIAGNOSIS — I2510 Atherosclerotic heart disease of native coronary artery without angina pectoris: Secondary | ICD-10-CM | POA: Diagnosis not present

## 2017-11-18 DIAGNOSIS — Z8249 Family history of ischemic heart disease and other diseases of the circulatory system: Secondary | ICD-10-CM | POA: Diagnosis not present

## 2017-11-18 MED ORDER — TECHNETIUM TC 99M PYROPHOSPHATE
21.5000 | Freq: Once | INTRAVENOUS | Status: AC
  Administered 2017-11-18: 21.5 via INTRAVENOUS

## 2017-11-25 ENCOUNTER — Telehealth: Payer: Self-pay

## 2017-11-25 MED ORDER — DROXIDOPA 100 MG PO CAPS: 100.0000 mg | ORAL_CAPSULE | Freq: Three times a day (TID) | ORAL | 11 refills | Status: DC

## 2017-11-25 NOTE — Telephone Encounter (Signed)
Script sent in for Northera 100mg  tid per Claiborne Billings Auten's recommendation.

## 2017-11-26 ENCOUNTER — Telehealth: Payer: Self-pay | Admitting: Internal Medicine

## 2017-11-26 NOTE — Telephone Encounter (Signed)
New Message     Disregard opened in error

## 2017-11-26 NOTE — Telephone Encounter (Signed)
New Message   Pt c/o medication issue:  1. Name of Medication: Droxidopa ([place]) 100 MG CAPS  2. How are you currently taking this medication (dosage and times per day)? Take 100 mg by mouth 3 (three) times daily.  3. Are you having a reaction (difficulty breathing--STAT)? no  4. What is your medication issue? [facility] is calling, states that this medication needs to go to a specialty pharmacy Fax:[phone]

## 2017-11-27 NOTE — Telephone Encounter (Signed)
Order sent via fax.

## 2017-12-01 ENCOUNTER — Telehealth: Payer: Self-pay

## 2017-12-01 ENCOUNTER — Other Ambulatory Visit: Payer: Self-pay | Admitting: Internal Medicine

## 2017-12-01 NOTE — Telephone Encounter (Signed)
Medication PA Form received via fax from [facility] concerning the pts Northera. I have completed the form and faxed it back to [place].

## 2017-12-02 ENCOUNTER — Other Ambulatory Visit: Payer: Medicare Other | Admitting: *Deleted

## 2017-12-02 ENCOUNTER — Other Ambulatory Visit: Payer: Self-pay

## 2017-12-02 DIAGNOSIS — E859 Amyloidosis, unspecified: Secondary | ICD-10-CM | POA: Diagnosis not present

## 2017-12-02 NOTE — Progress Notes (Signed)
pa

## 2017-12-03 LAB — KAPPA/LAMBDA LIGHT CHAINS
Ig Kappa Free Light Chain: 18.2 mg/L (ref 3.3–19.4)
Ig Lambda Free Light Chain: 17.5 mg/L (ref 5.7–26.3)
KAPPA/LAMBDA FLC RATIO: 1.04 (ref 0.26–1.65)

## 2017-12-14 ENCOUNTER — Ambulatory Visit (INDEPENDENT_AMBULATORY_CARE_PROVIDER_SITE_OTHER): Payer: Medicare Other | Admitting: Internal Medicine

## 2017-12-14 ENCOUNTER — Encounter: Payer: Self-pay | Admitting: Internal Medicine

## 2017-12-14 VITALS — BP 215/102 | HR 61 | Ht 69.0 in | Wt 140.8 lb

## 2017-12-14 DIAGNOSIS — I428 Other cardiomyopathies: Secondary | ICD-10-CM

## 2017-12-14 DIAGNOSIS — I951 Orthostatic hypotension: Secondary | ICD-10-CM | POA: Diagnosis not present

## 2017-12-14 DIAGNOSIS — I6523 Occlusion and stenosis of bilateral carotid arteries: Secondary | ICD-10-CM | POA: Diagnosis not present

## 2017-12-14 DIAGNOSIS — R5383 Other fatigue: Secondary | ICD-10-CM | POA: Diagnosis not present

## 2017-12-14 NOTE — Progress Notes (Signed)
ELECTROPHYSIOLOGY OFFICE NOTE  Patient ID: Adam Clements, MRN: [id number], DOB/AGE: [birth year] 82 y.o. Admit date: (Not on file) Date of Consult: 12/14/2017  Primary Physician: Ria Bush, MD Primary Cardiologist: LEANORD THIBEAU is a 83 y.o. male who is being seen today for the evaluation of orthostatic hypotension at the request of himself and wife.    HPI Adam Clements is a 83 y.o. male seen today because of profound orthostasis with lightheadedness and fatigue.  It is somewhat delayed occurring a few minutes after standing.  He has had syncope.      We have looked for evidence of amyloid but have found little.  He does not have a history of hypertension.He does not have problems with bowel function.  He is having progressive problems with dry mouth.  No problems with bowel function and stable issues with bladder function.    He continues to struggle.  He was intolerant of Mestinon.  He found no benefit from a compressive wear.  He has been tried on [employer] and is currently undergoing approval for Northera.  I have reached out to [facility] to arrange for quantitative autonomic testing; in conversation with local neurology sent, we do not have access to that here.    He is not aware of why he is on dual antiplatelet therapy;>> review of his chart identifies a prior TIA with expressive aphasia which he subsequently acknowledges.   DATE TEST EF   6/17 MYOVIEW  36 %   4/17 Echo   45-50% LAE severe        i    Past Medical History:  Diagnosis Date  . Allergic rhinitis   . Arthritis    Gout  . BPH (benign prostatic hypertrophy)   . Bradycardia   . CAD (coronary artery disease)    nonobstructive  . Chronic anemia 2013   h/o IDA, did not tolerate oral iron, latest normal iron studies, B12, folate  . Diverticulosis of colon    conoscopy 12/1993 (Dr. Amedeo Plenty) S/G divertics//colonoscopy L&R divertics (Dr. Amedeo Plenty) 11/08/2004  . Dizziness   . Gout   . HTN  (hypertension)   . Memory loss   . Mitral regurgitation    Pulmonic regurgitation, with marked LA enlargement  . Nonischemic cardiomyopathy (HCC)    EF 40%  . Orthostatic hypotension   . PVC (premature ventricular contraction)   . PVD (peripheral vascular disease) ([place])    followed by Dr Trula Slade, treating medically (pletal and aspirin)  . RBBB longstanding  . Squamous cell carcinoma in situ of skin 2015   L ear  . Syncope       Surgical History:  Past Surgical History:  Procedure Laterality Date  . 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 APPENDECTOMY    . dexa  2004   "shrunk 2 inches", WNL     Home Meds: Prior to Admission medications   Medication Sig Start Date End Date Taking? Authorizing Provider  cholecalciferol (VITAMIN D) 1000 units tablet Take 1,000 Units by mouth daily.   Yes [provider]  dipyridamole-aspirin (AGGRENOX) 200-25 MG 12hr capsule Take 1 capsule by mouth 2 (two) times daily. 09/30/17  Yes Ria Bush, MD  fludrocortisone (FLORINEF) 0.1 MG tablet Take 0.1 mg by mouth 2 (two) times daily.  09/30/17  Yes Ria Bush, MD  pravastatin (PRAVACHOL) 20 MG tablet Take 1 tablet (20 mg total) by mouth daily. 08/18/17  Yes Ria Bush,  MD  vitamin B-12 (CYANOCOBALAMIN) 500 MCG tablet Take 500 mcg by mouth daily.   Yes [provider]    Allergies:  Allergies  Allergen Reactions  . Atorvastatin Other (See Comments)    Memory trouble, confusion, fatigue  . Pletal [Cilostazol]     Social History   Socioeconomic History  . Marital status: Married    Spouse name: Not on file  . Number of children: Not on file  . Years of education: Not on file  . Highest education level: Not on file  Occupational History  . Not on file  Social Needs  . Financial resource strain: Not on file  . Food insecurity:    Worry: Not on file    Inability: Not on file  . Transportation needs:    Medical: Not on file    Non-medical: Not on  file  Tobacco Use  . Smoking status: Former Smoker    Packs/day: 2.00    Types: Cigarettes    Last attempt to quit: 11/10/1982    Years since quitting: 35.1  . Smokeless tobacco: Former Network engineer and Sexual Activity  . Alcohol use: Yes    Alcohol/week: 7.0 standard drinks    Types: 7 Glasses of wine per week    Comment: 1 glass of wine daily. History of heavy drinking prior to 2005.  . Drug use: No  . Sexual activity: Never  Lifestyle  . Physical activity:    Days per week: Not on file    Minutes per session: Not on file  . Stress: Not on file  Relationships  . Social connections:    Talks on phone: Not on file    Gets together: Not on file    Attends religious service: Not on file    Active member of club or organization: Not on file    Attends meetings of clubs or organizations: Not on file    Relationship status: Not on file  . Intimate partner violence:    Fear of current or ex partner: Not on file    Emotionally abused: Not on file    Physically abused: Not on file    Forced sexual activity: Not on file  Other Topics Concern  . Not on file  Social History Narrative   Lives with wife [name]). Dog passed away 11-24-12.   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 HCPOA is wife then son and then oldest daughter etc. Advanced directives in chart November 24, 2012). Does not want artificial nutrition/hydration or prolonged life support      Does have stairs in home. BS Degree     Family History  Problem Relation Age of Onset  . Heart disease Mother 71       Natural causes with CHF  . Hypertension Mother   . Hypertension Father   . Stroke Father        cerebral hemorrhage  . Hyperlipidemia Brother   . Heart disease Brother         before age 18  . Hypertension Brother   . Heart attack Brother   . Hypertension Unknown      ROS:  Please see the history of present illness.     All other systems reviewed and negative.  Physical Exam: Blood pressure (!) 215/102, pulse 61, height 5\' 9"  (1.753 m), weight 140 lb 12.8 oz (63.9 kg), SpO2 95 %.  Well developed and nourished in no acute distress HENT normal Neck supple with JVP-flat Clear Regular rate and rhythm, no murmurs or gallops Abd-soft with active BS No Clubbing cyanosis edema Skin-warm and dry A & Oriented  Grossly normal sensory and motor function       Labs: Cardiac Enzymes No results for input(s): CKTOTAL, CKMB, TROPONINI in the last 72 hours. CBC Lab Results  Component Value Date   WBC 3.9 (L) 09/16/2017   HGB 11.8 (L) 09/16/2017   HCT 33.6 (L) 09/16/2017   MCV 94.9 09/16/2017   PLT 187.0 09/16/2017   PROTIME: No results for input(s): LABPROT, INR in the last 72 hours. Chemistry  No results for input(s): NA, K, CL, CO2, BUN, CREATININE, CALCIUM, PROT, BILITOT, ALKPHOS, ALT, AST, GLUCOSE in the last 168 hours.  Invalid input(s): LABALBU Lipids Lab Results  Component Value Date   CHOL 137 09/16/2017   HDL 58.60 09/16/2017   LDLCALC 71 09/16/2017   TRIG 34.0 09/16/2017   BNP No results found for: PROBNP Thyroid Function Tests: No results for input(s): TSH, T4TOTAL, T3FREE, THYROIDAB in the last 72 hours.  Invalid input(s): FREET3 Miscellaneous No results found for: DDIMER  Radiology/Studies:  No results found.  EKG: Sinus rhythm at 60 Intervals 18/16/49 Isolated RBBB PVC   Assessment and Plan:   Hypertension  Orthostatic hypotension  Cardiomyopathy-nonischemic  Bilateral carotid stenosis  TIA  Autonomic failure      The patient has profound orthostatic hypotension today with evidence of 125 mm drop in blood pressure with standing.  (215--90).  There was a minimal change in heart rate.  I have reviewed with him  our working diagnosis.  I have also stressed the importance in terms of diagnosis confirmation that were derived from the quantitative autonomic testing.  I will further reach out to Dr. Gordy Savers at [facility] to see if there is a neurologist who might be able to help clarify such a diagnosis.  A commented up-to-date suggest that a change in heart rate of less than 0.5 bpm per 10 mm change in blood pressure is consistent with autonomic failure.  We would qualify with 8 bpm over 190 mm.   He raises the issue of cognitive impairment.  Hard for me to know how to sort that out with a systolic blood pressure issue of 90  More than 50% of 45 min was spent in counseling related to the above   [employer]

## 2017-12-14 NOTE — Patient Instructions (Signed)
Medication Instructions:  Your physician recommends that you continue on your current medications as directed. Please refer to the Current Medication list given to you today.  Labwork: None ordered.  Testing/Procedures: None ordered.  Follow-Up:  Your physician recommends that you schedule a follow-up appointment as scheduled with Dr Caryl Comes.  Any Other Special Instructions Will Be Listed Below (If Applicable).     If you need a refill on your cardiac medications before your next appointment, please call your pharmacy.

## 2017-12-17 ENCOUNTER — Telehealth: Payer: Self-pay | Admitting: Internal Medicine

## 2017-12-17 NOTE — Telephone Encounter (Signed)
Walk in pt Form-sealed enveloped dropped off. Placed in [place] doc box.

## 2017-12-21 ENCOUNTER — Telehealth: Payer: Self-pay

## 2017-12-21 NOTE — Telephone Encounter (Signed)
**Note De-Identified  Obfuscation** Per denial letter that the pt provided from [place] I have done another Northera PA through covermymeds using a different DX of Autonomic failure. Key:  [name]

## 2017-12-22 ENCOUNTER — Telehealth: Payer: Self-pay | Admitting: Internal Medicine

## 2017-12-22 ENCOUNTER — Other Ambulatory Visit: Payer: Self-pay

## 2017-12-22 NOTE — Telephone Encounter (Signed)
Pt informed of his approval letter for his Northera. Pt will be calling the pharmacy to see if they have receieved the letter via fax. Pt understands he will need to call the office before starting on Northera.

## 2017-12-22 NOTE — Telephone Encounter (Signed)
Letter received from [facility] stating that they have approved the pts Northera PA. Approval good from 12/21/2017 until 12/21/2018.  I have notified the pts pharmacy.  I left a message on the pts VM asking him to call us back.

## 2017-12-22 NOTE — Telephone Encounter (Signed)
Diane calling from [employer] for insurance information. I let her know I did not know how to find that information. I gave her the number to medical records.

## 2017-12-22 NOTE — Telephone Encounter (Signed)
New message:       Loleta Books calling to discuss a referral that was sent over by Dr. Caryl Comes.

## 2018-01-06 ENCOUNTER — Encounter: Payer: Self-pay | Admitting: Internal Medicine

## 2018-01-06 ENCOUNTER — Ambulatory Visit (INDEPENDENT_AMBULATORY_CARE_PROVIDER_SITE_OTHER): Payer: Medicare Other | Admitting: Internal Medicine

## 2018-01-06 VITALS — BP 214/104 | HR 66 | Ht 69.0 in | Wt 143.0 lb

## 2018-01-06 DIAGNOSIS — I6523 Occlusion and stenosis of bilateral carotid arteries: Secondary | ICD-10-CM | POA: Diagnosis not present

## 2018-01-06 DIAGNOSIS — I951 Orthostatic hypotension: Secondary | ICD-10-CM

## 2018-01-06 DIAGNOSIS — G459 Transient cerebral ischemic attack, unspecified: Secondary | ICD-10-CM | POA: Diagnosis not present

## 2018-01-06 DIAGNOSIS — I428 Other cardiomyopathies: Secondary | ICD-10-CM

## 2018-01-06 NOTE — Progress Notes (Deleted)
      Patient Care Team: Ria Bush, MD as PCP - General (Family Medicine)   HPI  Adam Clements is a 83 y.o. male   Records and Results Reviewed***  Past Medical History:  Diagnosis Date  . Allergic rhinitis   . Arthritis    Gout  . BPH (benign prostatic hypertrophy)   . Bradycardia   . CAD (coronary artery disease)    nonobstructive  . Chronic anemia 2013   h/o IDA, did not tolerate oral iron, latest normal iron studies, B12, folate  . Diverticulosis of colon    conoscopy 12/1993 (Dr. Amedeo Plenty) S/G divertics//colonoscopy L&R divertics (Dr. Amedeo Plenty) 11/08/2004  . Dizziness   . Gout   . HTN (hypertension)   . Memory loss   . Mitral regurgitation    Pulmonic regurgitation, with marked LA enlargement  . Nonischemic cardiomyopathy (HCC)    EF 40%  . Orthostatic hypotension   . PVC (premature ventricular contraction)   . PVD (peripheral vascular disease) ([place])    followed by Dr Trula Slade, treating medically (pletal and aspirin)  . RBBB longstanding  . Squamous cell carcinoma in situ of skin 2015   L ear  . Syncope     Past Surgical History:  Procedure Laterality Date  . 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 APPENDECTOMY    . dexa  2004   "shrunk 2 inches", WNL    Current Meds  Medication Sig  . cholecalciferol (VITAMIN D) 1000 units tablet Take 1,000 Units by mouth daily.  Marland Kitchen dipyridamole-aspirin (AGGRENOX) 200-25 MG 12hr capsule Take 1 capsule by mouth 2 (two) times daily.  . midodrine (PROAMATINE) 5 MG tablet Take 1 tablet (5 mg total) by mouth 3 (three) times daily with meals.  . pravastatin (PRAVACHOL) 20 MG tablet Take 1 tablet (20 mg total) by mouth daily.  . vitamin B-12 (CYANOCOBALAMIN) 500 MCG tablet Take 500 mcg by mouth daily.    Allergies  Allergen Reactions  . Atorvastatin Other (See Comments)    Memory trouble, confusion, fatigue  . Pletal [Cilostazol]       Review of Systems negative except from HPI and PMH  Physical Exam BP  (!) 214/104 (BP Location: Left Arm, Patient Position: Sitting, Cuff Size: Normal)   Pulse 66   Ht 5\' 9"  (1.753 m)   Wt 143 lb (64.9 kg)   SpO2 98%   BMI 21.12 kg/m  Well developed and well nourished in no acute distress HENT normal E scleral and icterus clear Neck Supple JVP flat; carotids brisk and full Clear to ausculation {CARD RHYTHM:10874} ***Regular rate and rhythm, no murmurs gallops or rub Soft with active bowel sounds No clubbing cyanosis {Numbers; edema:17696} Edema Alert and oriented, grossly normal motor and sensory function Skin Warm and Dry    Assessment and  Plan       Current medicines are reviewed at length with the patient today .  The patient does not*** have concerns regarding medicines.\

## 2018-01-06 NOTE — Progress Notes (Signed)
ELECTROPHYSIOLOGY OFFICE NOTE  Patient ID: Adam Clements, MRN: [id number], DOB/AGE: 27-Nov-1934 83 y.o. Admit date: (Not on file) Date of Consult: 01/06/2018  Primary Physician: Ria Bush, MD Primary Cardiologist: Adam Clements is a 83 y.o. male who is being seen today for the evaluation of orthostatic hypotension at the request of himself and wife.    HPI Adam Clements is a 83 y.o. male seen today because of profound orthostasis with lightheadedness and fatigue.  It is somewhat delayed occurring a few minutes after standing.  He has had syncope.      We have looked for evidence of amyloid but have found little>> borderline PYP ; myeloma panel>>M prot 0.3  FLC assay neg    IFE urine neg  Discussed with Dr Gaylan Gerold  He does not have a history of hypertension.He does not have problems with bowel function.  He is having progressive problems with dry mouth.  No problems with bowel function and stable issues with bladder function.    He continues to struggle.  He was intolerant of Mestinon.  He found no benefit from a compressive wear.  He has been tried on [employer] and has just been approved for [employer]  He is scheduled for autonomic testing at [facility] is worse in the am   He is not aware of why he is on dual antiplatelet therapy;>> review of his chart identifies a prior TIA with expressive aphasia which he subsequently acknowledges.   DATE TEST EF   6/17 MYOVIEW  36 %   4/17 Echo   45-50% LAE severe        Date Cr Hgb  7/19 1.16 11.8           Past Medical History:  Diagnosis Date  . Allergic rhinitis   . Arthritis    Gout  . BPH (benign prostatic hypertrophy)   . Bradycardia   . CAD (coronary artery disease)    nonobstructive  . Chronic anemia 2013   h/o IDA, did not tolerate oral iron, latest normal iron studies, B12, folate  . Diverticulosis of colon    conoscopy 12/1993 (Dr. Amedeo Plenty) S/G  divertics//colonoscopy L&R divertics (Dr. Amedeo Plenty) 11/08/2004  . Dizziness   . Gout   . HTN (hypertension)   . Memory loss   . Mitral regurgitation    Pulmonic regurgitation, with marked LA enlargement  . Nonischemic cardiomyopathy (HCC)    EF 40%  . Orthostatic hypotension   . PVC (premature ventricular contraction)   . PVD (peripheral vascular disease) ([place])    followed by Dr Trula Slade, treating medically (pletal and aspirin)  . RBBB longstanding  . Squamous cell carcinoma in situ of skin 2015   L ear  . Syncope       Surgical History:  Past Surgical History:  Procedure Laterality Date  . 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 APPENDECTOMY    . dexa  2004   "shrunk 2 inches", WNL    Current Meds  Medication Sig  . cholecalciferol (VITAMIN D) 1000 units tablet Take 1,000 Units by mouth daily.  Marland Kitchen dipyridamole-aspirin (AGGRENOX) 200-25 MG 12hr capsule Take 1 capsule by mouth 2 (two) times daily.  . midodrine (PROAMATINE) 5 MG tablet Take 1 tablet (5 mg total) by mouth 3 (three) times daily with meals.  . pravastatin (PRAVACHOL) 20 MG tablet Take 1 tablet (20 mg total) by mouth daily.  . vitamin  B-12 (CYANOCOBALAMIN) 500 MCG tablet Take 500 mcg by mouth daily.      Allergies:  Allergies  Allergen Reactions  . Atorvastatin Other (See Comments)    Memory trouble, confusion, fatigue  . Pletal [Cilostazol]     Social History   Socioeconomic History  . Marital status: Married    Spouse name: Not on file  . Number of children: Not on file  . Years of education: Not on file  . Highest education level: Not on file  Occupational History  . Not on file  Social Needs  . Financial resource strain: Not on file  . Food insecurity:    Worry: Not on file    Inability: Not on file  . Transportation needs:    Medical: Not on file    Non-medical: Not on file  Tobacco Use  . Smoking status: Former Smoker    Packs/day: 2.00    Types: Cigarettes    Last attempt to quit:  11/10/1982    Years since quitting: 35.1  . Smokeless tobacco: Former Network engineer and Sexual Activity  . Alcohol use: Yes    Alcohol/week: 7.0 standard drinks    Types: 7 Glasses of wine per week    Comment: 1 glass of wine daily. History of heavy drinking prior to 2005.  . Drug use: No  . Sexual activity: Never  Lifestyle  . Physical activity:    Days per week: Not on file    Minutes per session: Not on file  . Stress: Not on file  Relationships  . Social connections:    Talks on phone: Not on file    Gets together: Not on file    Attends religious service: Not on file    Active member of club or organization: Not on file    Attends meetings of clubs or organizations: Not on file    Relationship status: Not on file  . Intimate partner violence:    Fear of current or ex partner: Not on file    Emotionally abused: Not on file    Physically abused: Not on file    Forced sexual activity: Not on file  Other Topics Concern  . Not on file  Social History Narrative   Lives with wife Adam Clements). Dog passed away 2012-12-15.   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 HCPOA is wife then son and then oldest daughter etc. Advanced directives in chart December 15, 2012). Does not want artificial nutrition/hydration or prolonged life support      Does have stairs in home. BS Degree     Family History  Problem Relation Age of Onset  . Heart disease Mother 52       Natural causes with CHF  . Hypertension Mother   . Hypertension Father   . Stroke Father        cerebral hemorrhage  . Hyperlipidemia Brother   . Heart disease Brother        before age 44  . Hypertension Brother   . Heart attack Brother   . Hypertension Unknown      ROS:  Please see the  history of present illness.     All other systems reviewed and negative.    Physical Exam: Blood pressure (!) 214/104,  pulse 66, height 5\' 9"  (1.753 m), weight 143 lb (64.9 kg), SpO2 98 %.  Well developed and nourished in no acute distress HENT normal Neck supple with JVP-flat Clear Regular rate and rhythm, no murmurs or gallops Abd-soft with active BS No Clubbing cyanosis edema Skin-warm and dry A & Oriented  Grossly normal sensory and motor function        Labs: Cardiac Enzymes No results for input(s): CKTOTAL, CKMB, TROPONINI in the last 72 hours. CBC Lab Results  Component Value Date   WBC 3.9 (L) 09/16/2017   HGB 11.8 (L) 09/16/2017   HCT 33.6 (L) 09/16/2017   MCV 94.9 09/16/2017   PLT 187.0 09/16/2017   PROTIME: No results for input(s): LABPROT, INR in the last 72 hours. Chemistry  No results for input(s): NA, K, CL, CO2, BUN, CREATININE, CALCIUM, PROT, BILITOT, ALKPHOS, ALT, AST, GLUCOSE in the last 168 hours.  Invalid input(s): LABALBU Lipids Lab Results  Component Value Date   CHOL 137 09/16/2017   HDL 58.60 09/16/2017   LDLCALC 71 09/16/2017   TRIG 34.0 09/16/2017   BNP No results found for: PROBNP Thyroid Function Tests: No results for input(s): TSH, T4TOTAL, T3FREE, THYROIDAB in the last 72 hours.  Invalid input(s): FREET3 Miscellaneous No results found for: DDIMER  Radiology/Studies:  No results found.  EKG: Sinus rhythm at 60 Intervals 18/16/49 Isolated RBBB PVC   Assessment and Plan:   Hypertension  Orthostatic hypotension  Cardiomyopathy-nonischemic  Bilateral carotid stenosis  TIA  Autonomic failure     The issue remains as to the mechanism of his autonomic failure.  Reviewing his pyrophosphate scan, I would put him in a grade 1-2 (personally reviewed) and as such we will pursue MRI of his autonomic testing at [facility] does not clarify diagnosis of autonomic failure.  We have also discussed nonpharmacological therapies which  have been largely unhelpful.  Lauree Chandler has been approved.  We will also have him raise the head of his bed 6-8 inches as this is the worst time of his day.  He does not seem to have a lot of problems with exercise somewhat surprisingly.  We spent more than 50% of our >25 min visit in face to face counseling regarding the above   Virl Axe

## 2018-01-06 NOTE — Progress Notes (Deleted)
ELECTROPHYSIOLOGY OFFICE NOTE  Patient ID: Adam Clements, MRN: [id number], DOB/AGE: 1934-04-08 83 y.o. Admit date: (Not on file) Date of Consult: 01/06/2018  Primary Physician: Ria Bush, MD Primary Cardiologist: Adam Clements is a 83 y.o. male who is being seen today for the evaluation of orthostatic hypotension at the request of himself and wife.    HPI Adam Clements is a 83 y.o. male seen today because of profound orthostasis with lightheadedness and fatigue.  It is somewhat delayed occurring a few minutes after standing.  He has had syncope.      We have looked for evidence of amyloid but have found little.  He does not have a history of hypertension.He does not have problems with bowel function.  He is having progressive problems with dry mouth.  No problems with bowel function and stable issues with bladder function.    He continues to struggle.  He was intolerant of Mestinon.  He found no benefit from a compressive wear.  He has been tried on [employer] and is currently undergoing approval for Northera.  I have reached out to [facility] to arrange for quantitative autonomic testing; in conversation with local neurology sent, we do not have access to that here.    He is not aware of why he is on dual antiplatelet therapy;>> review of his chart identifies a prior TIA with expressive aphasia which he subsequently acknowledges.   DATE TEST EF   6/17 MYOVIEW  36 %   4/17 Echo   45-50% LAE severe        i    Past Medical History:  Diagnosis Date  . Allergic rhinitis   . Arthritis    Gout  . BPH (benign prostatic hypertrophy)   . Bradycardia   . CAD (coronary artery disease)    nonobstructive  . Chronic anemia 2013   h/o IDA, did not tolerate oral iron, latest normal iron studies, B12, folate  . Diverticulosis of colon    conoscopy 12/1993 (Dr. Amedeo Plenty) S/G divertics//colonoscopy L&R divertics (Dr. Amedeo Plenty) 11/08/2004  . Dizziness   . Gout   .  HTN (hypertension)   . Memory loss   . Mitral regurgitation    Pulmonic regurgitation, with marked LA enlargement  . Nonischemic cardiomyopathy (HCC)    EF 40%  . Orthostatic hypotension   . PVC (premature ventricular contraction)   . PVD (peripheral vascular disease) ([place])    followed by Dr Trula Slade, treating medically (pletal and aspirin)  . RBBB longstanding  . Squamous cell carcinoma in situ of skin 2015   L ear  . Syncope       Surgical History:  Past Surgical History:  Procedure Laterality Date  . 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 APPENDECTOMY    . dexa  2004   "shrunk 2 inches", WNL     Home Meds: Prior to Admission medications   Medication Sig Start Date End Date Taking? Authorizing Provider  cholecalciferol (VITAMIN D) 1000 units tablet Take 1,000 Units by mouth daily.   Yes [provider]  dipyridamole-aspirin (AGGRENOX) 200-25 MG 12hr capsule Take 1 capsule by mouth 2 (two) times daily. 09/30/17  Yes Ria Bush, MD  fludrocortisone (FLORINEF) 0.1 MG tablet Take 0.1 mg by mouth 2 (two) times daily.  09/30/17  Yes Ria Bush, MD  pravastatin (PRAVACHOL) 20 MG tablet Take 1 tablet (20 mg total) by mouth daily. 08/18/17  Yes Ria Bush,  MD  vitamin B-12 (CYANOCOBALAMIN) 500 MCG tablet Take 500 mcg by mouth daily.   Yes [provider]    Allergies:  Allergies  Allergen Reactions  . Atorvastatin Other (See Comments)    Memory trouble, confusion, fatigue  . Pletal [Cilostazol]     Social History   Socioeconomic History  . Marital status: Married    Spouse name: Not on file  . Number of children: Not on file  . Years of education: Not on file  . Highest education level: Not on file  Occupational History  . Not on file  Social Needs  . Financial resource strain: Not on file  . Food insecurity:    Worry: Not on file    Inability: Not on file  . Transportation needs:    Medical: Not on file    Non-medical: Not  on file  Tobacco Use  . Smoking status: Former Smoker    Packs/day: 2.00    Types: Cigarettes    Last attempt to quit: 11/10/1982    Years since quitting: 35.1  . Smokeless tobacco: Former Network engineer and Sexual Activity  . Alcohol use: Yes    Alcohol/week: 7.0 standard drinks    Types: 7 Glasses of wine per week    Comment: 1 glass of wine daily. History of heavy drinking prior to 2005.  . Drug use: No  . Sexual activity: Never  Lifestyle  . Physical activity:    Days per week: Not on file    Minutes per session: Not on file  . Stress: Not on file  Relationships  . Social connections:    Talks on phone: Not on file    Gets together: Not on file    Attends religious service: Not on file    Active member of club or organization: Not on file    Attends meetings of clubs or organizations: Not on file    Relationship status: Not on file  . Intimate partner violence:    Fear of current or ex partner: Not on file    Emotionally abused: Not on file    Physically abused: Not on file    Forced sexual activity: Not on file  Other Topics Concern  . Not on file  Social History Narrative   Lives with wife [name]). Dog passed away 2012-11-20.   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 HCPOA is wife then son and then oldest daughter etc. Advanced directives in chart 11/20/2012). Does not want artificial nutrition/hydration or prolonged life support      Does have stairs in home. BS Degree     Family History  Problem Relation Age of Onset  . Heart disease Mother 44       Natural causes with CHF  . Hypertension Mother   . Hypertension Father   . Stroke Father        cerebral hemorrhage  . Hyperlipidemia Brother   . Heart disease Brother         before age 27  . Hypertension Brother   . Heart attack Brother   . Hypertension Unknown      ROS:  Please see the history of present illness.     All other systems reviewed and negative.  Physical Exam: Blood pressure (!) 214/104, pulse 66, height 5\' 9"  (1.753 m), weight 143 lb (64.9 kg), SpO2 98 %.  Well developed and nourished in no acute distress HENT normal Neck supple with JVP-flat Clear Regular rate and rhythm, no murmurs or gallops Abd-soft with active BS No Clubbing cyanosis edema Skin-warm and dry A & Oriented  Grossly normal sensory and motor function       Labs: Cardiac Enzymes No results for input(s): CKTOTAL, CKMB, TROPONINI in the last 72 hours. CBC Lab Results  Component Value Date   WBC 3.9 (L) 09/16/2017   HGB 11.8 (L) 09/16/2017   HCT 33.6 (L) 09/16/2017   MCV 94.9 09/16/2017   PLT 187.0 09/16/2017   PROTIME: No results for input(s): LABPROT, INR in the last 72 hours. Chemistry  No results for input(s): NA, K, CL, CO2, BUN, CREATININE, CALCIUM, PROT, BILITOT, ALKPHOS, ALT, AST, GLUCOSE in the last 168 hours.  Invalid input(s): LABALBU Lipids Lab Results  Component Value Date   CHOL 137 09/16/2017   HDL 58.60 09/16/2017   LDLCALC 71 09/16/2017   TRIG 34.0 09/16/2017   BNP No results found for: PROBNP Thyroid Function Tests: No results for input(s): TSH, T4TOTAL, T3FREE, THYROIDAB in the last 72 hours.  Invalid input(s): FREET3 Miscellaneous No results found for: DDIMER  Radiology/Studies:  No results found.  EKG: Sinus rhythm at 60 Intervals 18/16/49 Isolated RBBB PVC   Assessment and Plan:   Hypertension  Orthostatic hypotension  Cardiomyopathy-nonischemic  Bilateral carotid stenosis  TIA  Autonomic failure      The patient has profound orthostatic hypotension today with evidence of 125 mm drop in blood pressure with standing.  (215--90).  There was a minimal change in heart rate.  I have reviewed with him our  working diagnosis.  I have also stressed the importance in terms of diagnosis confirmation that were derived from the quantitative autonomic testing.  I will further reach out to Dr. Gordy Savers at [facility] to see if there is a neurologist who might be able to help clarify such a diagnosis.  A commented up-to-date suggest that a change in heart rate of less than 0.5 bpm per 10 mm change in blood pressure is consistent with autonomic failure.  We would qualify with 8 bpm over 190 mm.   He raises the issue of cognitive impairment.  Hard for me to know how to sort that out with a systolic blood pressure issue of 90  More than 50% of 45 min was spent in counseling related to the above   [employer]

## 2018-01-07 ENCOUNTER — Telehealth: Payer: Self-pay

## 2018-01-07 DIAGNOSIS — R55 Syncope and collapse: Secondary | ICD-10-CM | POA: Diagnosis not present

## 2018-01-07 NOTE — Telephone Encounter (Signed)
LVM with pt regarding his Northera treatment form. His application has been received and is in process. Per the [employer], pt's insurance information will be reviewed and verified. At that time, the pt cost will be established. Pt may apply for financial aid or may qualify for a co-pay card at that time. I will follow up with Mr Soler on Monday October 21 after I have called Northera for an update.

## 2018-01-13 ENCOUNTER — Telehealth: Payer: Self-pay | Admitting: Internal Medicine

## 2018-01-13 NOTE — Telephone Encounter (Signed)
Spoke with pt and gave him the following instructions per Fuller Canada, Pharm D advisement:  Please stop using midodrine the day you begin your Northera. Please take your last dose of Northera at least 3 hours prior to bedtime and keep your head of bed raised at night.  Day 1 - 3, begin 100mg , three times per day.  Day 4 - 6, begin 200mg , three times per day.  Day 7 - 9, begin 300mg , three times per day.  You will follow up with our pharmacist here at our [facility] location on October 31 @ 3:30pm. They will be checking your orthostatic blood pressures and possibly titrating your medication to better suit your symptoms.   Please let me know if there is anything further you need.  I will be out of the office from October 29- Nov 12. If you have any medication questions at that time, please call our pharmacy here at [phone].  This message was also sent to pt via MyChart.  Pt has verbalized understanding and will call with any questions.

## 2018-01-13 NOTE — Telephone Encounter (Signed)
  Pt c/o medication issue:  1. Name of Medication: Northera   2. How are you currently taking this medication (dosage and times per day)? Hasn't started yet  3. Are you having a reaction (difficulty breathing--STAT)?  Na  4. What is your medication issue? Wants instructions on how to take this medication

## 2018-01-19 ENCOUNTER — Telehealth: Payer: Self-pay

## 2018-01-19 NOTE — Telephone Encounter (Signed)
**Note De-Identified Adam Clements Obfuscation** We received 5 bottles of Northera 100 mg samples with 90 capsules in each from [place]. The letter that came with the Northera was requesting Dr Adam Clements signature.  I obtained Dr Adam Clements signature and was advised by Dr Adam Clements that these samples are for this pt and he asked me to contact the pt  ASAP as he needs to start taking Northera ASAP.  I called the pt to let him know that his Adam Clements is here and that he can pick them up from the front office and he advised me that he also received 5 bottles from Northera through the mail and that he does not need any more at this time.  I have placed the Northera samples in the samples closet in the refill dept.

## 2018-01-21 ENCOUNTER — Ambulatory Visit (INDEPENDENT_AMBULATORY_CARE_PROVIDER_SITE_OTHER): Payer: Medicare Other | Admitting: Pharmacist

## 2018-01-21 VITALS — BP 132/68 | HR 69

## 2018-01-21 DIAGNOSIS — I6523 Occlusion and stenosis of bilateral carotid arteries: Secondary | ICD-10-CM | POA: Diagnosis not present

## 2018-01-21 DIAGNOSIS — I951 Orthostatic hypotension: Secondary | ICD-10-CM

## 2018-01-21 NOTE — Progress Notes (Signed)
Patient ID: [name]                 DOB: 09/25/1934                      MRN: [id number]     HPI: Adam Clements is a 83 y.o. male patient of Dr. Caryl Clements who presents today for hypotension evaluation. PMH significant for profound orthostasis with lightheadedness and fatigue.  It is somewhat delayed occurring a few minutes after standing.  He has had syncope.  He was intolerant of Mestinon.  He found no benefit from a compressive wear.  He has been tried on [employer] and has just been approved for [employer]. He has been seen at [facility] for autonomic testing.   He presents today for Northera titration. He started the Northera 1 week ago. Today is his first day of 300mg  TID. He states that he has been getting better since starting Northera. He states his pressures have been 115/72, 620 systolic.   He states that he started taking medications then sitting on bed for 30 minutes. This was recommended by [facility]. This has elimated the risk of a fall in the morning as he is not lightheaded by doing this. He states that he has had some lightheadedness just before bedtime 7-730 pm, but overall feels less dizzy/light headed. He states that the last dose is the dosage that varies the most.   He states he does feel a little better as he has increased his dose of the Northera, though he did notice that he has a pressure feeling in his temples today. He states it is not bad enough to call a headache, but he has not had this sensation in a long time.   He states he thinks the midodrine was helping and his symptoms have continue to improve as he transitioned to [place].   He also reports that he has lost about 8 lbs over the last 3 months. He is wondering if this is something that he should be concerned about since he is not actively trying to lose weight.    Current HTN meds:  Day 1 - 3, begin 100mg , three times per day.  Day 4 - 6, begin 200mg , three times per day.  Day 7 - 9, begin 300mg , three times per  day. (6am, 10am, 2pm) - started today  Previously tried: Mestinon, proamatine  Family History: mother - CHF, HTN' father - HTN, hemorrhagic stroke; brother - HLD, heart disease before age 52, HTN, MI  Social History: former smoker, 7 glasses of wine per week.   Diet: He eats most meals at home. Eats out on days goes to the gym. He drinks mostly tea coffee and water.   Exercise: He goes to the gym three times per week.   Home BP readings: see above  Wt Readings from Last 3 Encounters:  01/06/18 143 lb (64.9 kg)  12/14/17 140 lb 12.8 oz (63.9 kg)  11/18/17 147 lb (66.7 kg)   BP Readings from Last 3 Encounters:  01/21/18 132/68  01/06/18 (!) 214/104  12/14/17 (!) 215/102   Pulse Readings from Last 3 Encounters:  01/21/18 69  01/06/18 66  12/14/17 61    Renal function: CrCl cannot be calculated (Patient's most recent lab result is older than the maximum 21 days allowed.).  Past Medical History:  Diagnosis Date  . Allergic rhinitis   . Arthritis    Gout  . BPH (benign prostatic hypertrophy)   .  Bradycardia   . CAD (coronary artery disease)    nonobstructive  . Chronic anemia 2013   h/o IDA, did not tolerate oral iron, latest normal iron studies, B12, folate  . Diverticulosis of colon    conoscopy 12/1993 (Dr. Amedeo Plenty) S/G divertics//colonoscopy L&R divertics (Dr. Amedeo Plenty) 11/08/2004  . Dizziness   . Gout   . HTN (hypertension)   . Memory loss   . Mitral regurgitation    Pulmonic regurgitation, with marked LA enlargement  . Nonischemic cardiomyopathy (HCC)    EF 40%  . Orthostatic hypotension   . PVC (premature ventricular contraction)   . PVD (peripheral vascular disease) ([place])    followed by Dr Trula Slade, treating medically (pletal and aspirin)  . RBBB longstanding  . Squamous cell carcinoma in situ of skin 2015   L ear  . Syncope     Current Outpatient Medications on File Prior to Visit  Medication Sig Dispense Refill  . cholecalciferol (VITAMIN D) 1000 units  tablet Take 1,000 Units by mouth daily.    Marland Kitchen dipyridamole-aspirin (AGGRENOX) 200-25 MG 12hr capsule Take 1 capsule by mouth 2 (two) times daily. 60 capsule 11  . Droxidopa (NORTHERA) 200 MG CAPS Take 200 mg by mouth 3 (three) times daily. 90 capsule   . pravastatin (PRAVACHOL) 20 MG tablet Take 1 tablet (20 mg total) by mouth daily. 30 tablet 11  . vitamin B-12 (CYANOCOBALAMIN) 500 MCG tablet Take 500 mcg by mouth daily.     No current facility-administered medications on file prior to visit.     Allergies  Allergen Reactions  . Atorvastatin Other (See Comments)    Memory trouble, confusion, fatigue  . Pletal [Cilostazol]     Blood pressure 132/68, pulse 69. - standing Seated - 178/84 HR 71 Laying - 182/94 HR 66   Assessment/Plan: Hypertension: BP today is elevated above normal. While pressure still drops significantly upon standing, his symptoms are improved. He will continue to sit on the edge of the bed for 30 minutes as recommended by [facility]. Will back down slightly on the dose of Northera to 200mg  TID since pressures are markedly elevated from his baseline and seems to have the beginnings of a pressure headache today on 300mg  TID. Will have him monitor pressure both sitting and standing and follow up with log in 3 weeks.    Thank you, Lelan Pons. Patterson Hammersmith, [facility]  01/21/2018 4:47 PM

## 2018-01-21 NOTE — Patient Instructions (Signed)
Please decrease your northera dose back to 200mg  THREE times a day.   Please start monitoring your pressures in the morning and evening until you follow up in 3 weeks.

## 2018-01-22 ENCOUNTER — Encounter: Payer: Self-pay | Admitting: Pharmacist

## 2018-01-27 ENCOUNTER — Telehealth: Payer: Self-pay | Admitting: Pharmacist

## 2018-01-27 NOTE — Telephone Encounter (Signed)
Pt LM that would like return call about Northera.   Returned call and unable to reach pt. LMTCB

## 2018-01-28 NOTE — Telephone Encounter (Signed)
Spoke with patient - he reports that he has had a few bad days since changing to the northera - below the first pressures record each on each line represent his seated pressure and the second represents his standing pressures.   10/31- 109/36 seated 106/72 standing  11/1 - seated 170/120 - 160/114 - standing 149/109 had a really bad morning - he passed out this morning and fell off bed  Evening 171/121 - 162/81 standing  11/2 am 168/  119/95  Evening 115/73   151/73   11/3 am 167/114    162/95  Pm 151/68   137/73  11/4 am 169/116    140/93  Pm 155/64     113/84  11/5 - am 150/97     131/85  bad evening - took Northera and then around 9pm he became very confused and coordination was off for some time - he states that this did not improve until he went to bed. He did not check blood pressure as he was so out of it.   I advised he cut back on northera dose to 100mg  TID as pressures seem to be dropping less now despite him passing out on 11/1 and I am worried his pressures are increasing (particularly diastolic) too much. He will also continue to sit on the edge of the bed for 30 minutes each morning. He will continue to monitor and call with any other issues. Advised will route to Dr. Caryl Comes for further advise.   He also inquires about his appt on 12/3 that appears to have been cancelled with note to move sooner, but I do not see appt for sooner scheduled. Will follow up with Dr. Olin Pia RNs.

## 2018-02-02 ENCOUNTER — Telehealth: Payer: Self-pay | Admitting: Pharmacist

## 2018-02-02 NOTE — Telephone Encounter (Signed)
Pt called in and states that he disagrees with my AVS. He states that his Northera dose was decreased down to 100mg  TID but the AVS states 200mg  TID. After some conversation he does endorse that he had started the 300mg  TID the day he came to the office and that he had been taking the 200mg  TID prior to our phone conversation on 01/28/18 and reduced to 100mg  TID after our conversation.  He reports that over the last few days his pressures are as follows - first reflects sitting and second is standing  11/10 am - 190/95   147/95 11/10 pm - 110/61   107/93 11/11 am - 156/121  146/115 11/11 pm - 107/56   141/79  He reports feeling well on the 100mg  TID dose and has not had any issues since our last phone conversation. Advised he keep on this dose and continue to monitor.   He also inquires about the appt with Dr. Caryl Comes that was previously discussed. Advised that I would follow up. Spoke with Lorren who will contact [facility] and follow up with patient for appt. She is aware my appt can be cancelled should he see Dr. Caryl Comes around the same time.

## 2018-02-05 NOTE — Telephone Encounter (Signed)
Spoke with Mr Aversa today for an update. Pt states his "BP's have been stable and not dropping too much." He states his BP has been increasing when he stands, "but not too much." Pt continues to sit on the side of the bed in the morning for 30 min at a time before getting out of bed. I asked pt to try and reduce that time and see how he feels. He states he is getting impatient with taking his time in the morning and will try and reduce to see how he feels.   I have scheduled pt to come in on Monday Nov 18 to see Dr Caryl Comes to have a check up. Pt has agreed with plan and had no additional questions.

## 2018-02-08 ENCOUNTER — Ambulatory Visit (INDEPENDENT_AMBULATORY_CARE_PROVIDER_SITE_OTHER): Payer: Medicare Other | Admitting: Internal Medicine

## 2018-02-08 ENCOUNTER — Encounter: Payer: Self-pay | Admitting: Internal Medicine

## 2018-02-08 VITALS — BP 181/86 | HR 63 | Ht 69.0 in | Wt 148.0 lb

## 2018-02-08 DIAGNOSIS — I6523 Occlusion and stenosis of bilateral carotid arteries: Secondary | ICD-10-CM | POA: Diagnosis not present

## 2018-02-08 DIAGNOSIS — I951 Orthostatic hypotension: Secondary | ICD-10-CM

## 2018-02-08 NOTE — Patient Instructions (Addendum)
  Your physician has recommended you make the following change in your medication: [place] TO [place]   Your physician recommends that you schedule a follow-up appointment in: 02/22/18 AT 3:00 PM WITH [facility] D

## 2018-02-08 NOTE — Progress Notes (Signed)
ELECTROPHYSIOLOGY OFFICE NOTE  Patient ID: Adam Clements, MRN: [id number], DOB/AGE: 1935/02/06 83 y.o. Admit date: (Not on file) Date of Consult: 02/08/2018  Primary Physician: Ria Bush, MD Primary Cardiologist: DAX MURGUIA is a 83 y.o. male who is being seen today for the evaluation of orthostatic hypotension at the request of himself and wife.    HPI Adam Clements is a 83 y.o. male seen today because of profound orthostasis with lightheadedness and fatigue.  It is somewhat delayed occurring a few minutes after standing.  He has had syncope.    We have looked for evidence of amyloid but have found little>> borderline PYP ; myeloma panel>>M prot 0.3  FLC assay neg    IFE urine neg  Discussed with Dr Gaylan Gerold  He does have a history of hypertension. He does not have problems with bowel function.  He is having progressive problems with dry mouth.  No problems with bowel function and stable issues with bladder function.    He continues to struggle.  He was intolerant of Mestinon.  He found no benefit from a compressive wear.  He has been tried on [employer].  He is now on Northera.  He was intolerant of the doses of 300 mg because of confusion and possible associated hypertension.  It is his impression that he is better all on more care than off.    He underwent testing at [facility] under the care of Dr. Fenton Malling; results were consistent with autonomic failure--probably a neurogenic orthostatic hypotension definitely.  He continues to struggle with some syncope.  Exercise tolerance is poor.  Fatigue is relentless.  He is trying to stay hydrated.  He takes his Northera and volume prior to getting up in the morning.  He is raise the head of his bed 8 inches.  He sits on the side of his bed for 30 minutes.  Has had syncope associated with high pressures while sitting on the side of bed x1    He is not aware of why he is on dual antiplatelet therapy;>> review  of his chart identifies a prior TIA with expressive aphasia which he subsequently acknowledges.   DATE TEST EF   6/17 MYOVIEW  36 %   4/17 Echo   45-50% LAE severe        Date Cr Hgb  7/19 1.16 11.8  10/19 1.5 10.4      Past Medical History:  Diagnosis Date  . Allergic rhinitis   . Arthritis    Gout  . BPH (benign prostatic hypertrophy)   . Bradycardia   . CAD (coronary artery disease)    nonobstructive  . Chronic anemia 2013   h/o IDA, did not tolerate oral iron, latest normal iron studies, B12, folate  . Diverticulosis of colon    conoscopy 12/1993 (Dr. Amedeo Plenty) S/G divertics//colonoscopy L&R divertics (Dr. Amedeo Plenty) 11/08/2004  . Dizziness   . Gout   . HTN (hypertension)   . Memory loss   . Mitral regurgitation    Pulmonic regurgitation, with marked LA enlargement  . Nonischemic cardiomyopathy (HCC)    EF 40%  . Orthostatic hypotension   . PVC (premature ventricular contraction)   . PVD (peripheral vascular disease) ([place])    followed by Dr Trula Slade, treating medically (pletal and aspirin)  . RBBB longstanding  . Squamous cell carcinoma in situ of skin 2015   L ear  . Syncope  Surgical History:  Past Surgical History:  Procedure Laterality Date  . 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 APPENDECTOMY    . dexa  2004   "shrunk 2 inches", WNL    Current Meds  Medication Sig  . cholecalciferol (VITAMIN D) 1000 units tablet Take 1,000 Units by mouth daily.  Marland Kitchen dipyridamole-aspirin (AGGRENOX) 200-25 MG 12hr capsule Take 1 capsule by mouth 2 (two) times daily.  . Droxidopa (NORTHERA) 100 MG CAPS Take 1 capsule by mouth 3 (three) times daily.  . pravastatin (PRAVACHOL) 20 MG tablet Take 1 tablet (20 mg total) by mouth daily.  . vitamin B-12 (CYANOCOBALAMIN) 500 MCG tablet Take 500 mcg by mouth daily.      Allergies:  Allergies  Allergen Reactions  . Atorvastatin Other (See Comments)    Memory trouble, confusion, fatigue  . Pletal [Cilostazol]      Social History   Socioeconomic History  . Marital status: Married    Spouse name: Not on file  . Number of children: Not on file  . Years of education: Not on file  . Highest education level: Not on file  Occupational History  . Not on file  Social Needs  . Financial resource strain: Not on file  . Food insecurity:    Worry: Not on file    Inability: Not on file  . Transportation needs:    Medical: Not on file    Non-medical: Not on file  Tobacco Use  . Smoking status: Former Smoker    Packs/day: 2.00    Types: Cigarettes    Last attempt to quit: 11/10/1982    Years since quitting: 35.2  . Smokeless tobacco: Former Network engineer and Sexual Activity  . Alcohol use: Yes    Alcohol/week: 7.0 standard drinks    Types: 7 Glasses of wine per week    Comment: 1 glass of wine daily. History of heavy drinking prior to 2005.  . Drug use: No  . Sexual activity: Never  Lifestyle  . Physical activity:    Days per week: Not on file    Minutes per session: Not on file  . Stress: Not on file  Relationships  . Social connections:    Talks on phone: Not on file    Gets together: Not on file    Attends religious service: Not on file    Active member of club or organization: Not on file    Attends meetings of clubs or organizations: Not on file    Relationship status: Not on file  . Intimate partner violence:    Fear of current or ex partner: Not on file    Emotionally abused: Not on file    Physically abused: Not on file    Forced sexual activity: Not on file  Other Topics Concern  . Not on file  Social History Narrative   Lives with wife [name]). Dog passed away 19-Nov-2012.   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 HCPOA is wife  then son and then oldest daughter etc. Advanced directives in chart 11-19-2012). Does not want artificial nutrition/hydration or prolonged life support      Does have stairs in home. BS Degree     Family History  Problem Relation Age  of Onset  . Heart disease Mother 30       Natural causes with CHF  . Hypertension Mother   . Hypertension Father   . Stroke Father        cerebral hemorrhage  . Hyperlipidemia Brother   . Heart disease Brother        before age 53  . Hypertension Brother   . Heart attack Brother   . Hypertension Unknown      ROS:  Please see the history of present illness.     All other systems reviewed and negative.   Physical Exam: Blood pressure (!) 181/86, pulse 63, height 5\' 9"  (1.753 m), weight 148 lb (67.1 kg), SpO2 96 %.  Well developed and nourished in no acute distress HENT normal Neck supple with JVP-flat Clear Regular rate and rhythm, no murmurs or gallops Abd-soft with active BS No Clubbing cyanosis edema Skin-warm and dry A & Oriented  Grossly normal sensory and motor function   Labs: Cardiac Enzymes No results for input(s): CKTOTAL, CKMB, TROPONINI in the last 72 hours. CBC Lab Results  Component Value Date   WBC 3.9 (L) 09/16/2017   HGB 11.8 (L) 09/16/2017   HCT 33.6 (L) 09/16/2017   MCV 94.9 09/16/2017   PLT 187.0 09/16/2017   PROTIME: No results for input(s): LABPROT, INR in the last 72 hours. Chemistry  No results for input(s): NA, K, CL, CO2, BUN, CREATININE, CALCIUM, PROT, BILITOT, ALKPHOS, ALT, AST, GLUCOSE in the last 168 hours.  Invalid input(s): LABALBU Lipids Lab Results  Component Value Date   CHOL 137 09/16/2017   HDL 58.60 09/16/2017   LDLCALC 71 09/16/2017   TRIG 34.0 09/16/2017   BNP No results found for: PROBNP Thyroid Function Tests: No results for input(s): TSH, T4TOTAL, T3FREE, THYROIDAB in the last 72 hours.  Invalid input(s): FREET3 Miscellaneous No results found for: DDIMER  Radiology/Studies:  No  results found.  EKG: Sinus rhythm at 60 Intervals 18/16/49 Isolated RBBB PVC   Assessment and Plan:   Hypertension  Orthostatic hypotension  Cardiomyopathy-nonischemic  Bilateral carotid stenosis  TIA  Autonomic failure      He is autonomic failure is likely going to be progressive.  We reviewed this for some time.  For now, we will try to increase Northera  from 100--200 and have encouraged him to continue with his compressive wear including consideration of re-trying his abdominal binder.  Few other systemic autonomic symptoms at this point.  More than 50% of 40 min was spent in counseling related to the above   [employer]

## 2018-02-09 ENCOUNTER — Ambulatory Visit: Payer: Self-pay

## 2018-02-22 ENCOUNTER — Ambulatory Visit (INDEPENDENT_AMBULATORY_CARE_PROVIDER_SITE_OTHER): Payer: Medicare Other | Admitting: Pharmacist

## 2018-02-22 VITALS — BP 138/84 | HR 59

## 2018-02-22 DIAGNOSIS — I951 Orthostatic hypotension: Secondary | ICD-10-CM

## 2018-02-22 DIAGNOSIS — I5022 Chronic systolic (congestive) heart failure: Secondary | ICD-10-CM

## 2018-02-22 DIAGNOSIS — I6523 Occlusion and stenosis of bilateral carotid arteries: Secondary | ICD-10-CM

## 2018-02-22 NOTE — Progress Notes (Signed)
Patient ID: [name]                 DOB: [birth year]                      MRN: [id number]     HPI: Adam Clements is a 83 y.o. male patient of Dr. Caryl Clements who presents today for hypotension follow up. PMH significant for profound orthostasis with lightheadedness and fatigue.  It is somewhat delayed occurring a few minutes after standing.  He has had syncope.  He was intolerant of Mestinon.  He found no benefit from a compressive wear.  He has been tried on [employer]. He continues to struggle.  He was intolerant of Mestinon. He found no benefit from a compressive wear.  He has been tried on [employer].  He is now on Northera.  He was intolerant of the doses of 300 mg because of confusion and possible associated hypertension.  It is his impression that he is better on Northera than off.  He underwent testing at [facility] under the care of Dr. Fenton Clements; results were consistent with autonomic failure--probably a neurogenic orthostatic hypotension definitely per Dr. Caryl Clements. Dr. Caryl Clements requested that CBC, Iron, TIBC and ferritin panel be ordered today.   At his last visit with HTN clinic his dose was reduced down to 200mg  TID and then 100mg  TID after several calls with confusion and high pressures. He was seen by Dr. Caryl Clements 2 weeks ago and increased to 200mg  TID.   He presents today for Northera follow up. He states that he had another episode where he fell to the floor from the seated position in the morning. He states he was out only seconds, but did fall to floor. This happened on 02/16/18. He states that otherwise he seems to be doing well. He did not check his pressure the day of this fall. He states that he frequently delays his afternoon dose until 5pm, but never later than 5pm.   He has been tracking pressures and has log (scanned in).   Current HTN meds:  Northera 200mg  TID  Previously tried: Mestinon, proamatine  Family History: mother - CHF, HTN' father - HTN, hemorrhagic stroke; brother -  HLD, heart disease before age 70, HTN, MI  Social History: former smoker, 7 glasses of wine per week.   Diet: He eats most meals at home. Eats out on days goes to the gym. He drinks mostly tea coffee and water.   Exercise: He goes to the gym three times per week.   Home BP readings: see above  Wt Readings from Last 3 Encounters:  02/08/18 148 lb (67.1 kg)  01/06/18 143 lb (64.9 kg)  12/14/17 140 lb 12.8 oz (63.9 kg)   BP Readings from Last 3 Encounters:  02/22/18 138/84  02/08/18 (!) 181/86  01/21/18 132/68   Pulse Readings from Last 3 Encounters:  02/22/18 (!) 59  02/08/18 63  01/21/18 69    Renal function: CrCl cannot be calculated (Patient's most recent lab result is older than the maximum 21 days allowed.).  Past Medical History:  Diagnosis Date  . Allergic rhinitis   . Arthritis    Gout  . BPH (benign prostatic hypertrophy)   . Bradycardia   . CAD (coronary artery disease)    nonobstructive  . Chronic anemia 2013   h/o IDA, did not tolerate oral iron, latest normal iron studies, B12, folate  . Diverticulosis of colon    conoscopy  12/1993 (Dr. Amedeo Clements) S/G divertics//colonoscopy L&R divertics (Dr. Amedeo Clements) 11/08/2004  . Dizziness   . Gout   . HTN (hypertension)   . Memory loss   . Mitral regurgitation    Pulmonic regurgitation, with marked LA enlargement  . Nonischemic cardiomyopathy (HCC)    EF 40%  . Orthostatic hypotension   . PVC (premature ventricular contraction)   . PVD (peripheral vascular disease) (Adam Clements)    followed by Dr Trula Clements, treating medically (pletal and aspirin)  . RBBB longstanding  . Squamous cell carcinoma in situ of skin 2015   L ear  . Syncope     Current Outpatient Medications on File Prior to Visit  Medication Sig Dispense Refill  . cholecalciferol (VITAMIN D) 1000 units tablet Take 1,000 Units by mouth daily.    Marland Kitchen dipyridamole-aspirin (AGGRENOX) 200-25 MG 12hr capsule Take 1 capsule by mouth 2 (two) times daily. 60 capsule 11  .  Droxidopa (NORTHERA) 100 MG CAPS Take 1 capsule by mouth 3 (three) times daily.    . pravastatin (PRAVACHOL) 20 MG tablet Take 1 tablet (20 mg total) by mouth daily. 30 tablet 11  . vitamin B-12 (CYANOCOBALAMIN) 500 MCG tablet Take 500 mcg by mouth daily.     No current facility-administered medications on file prior to visit.     Allergies  Allergen Reactions  . Atorvastatin Other (See Comments)    Memory trouble, confusion, fatigue  . Pletal [Cilostazol]     Blood pressure 138/84, pulse (!) 59. - standing    Assessment/Plan: Hypertension: BP today is about the same as previous visit. Will continue Northera at 200mg  TID for now. Advised to continue monitoring orthostatic symptoms (will consider increase to 300mg  TID if additional am symptoms). CBC and Fe labs drawn today as per Dr. Caryl Clements. He will return for follow up in pharmacy clinic for cuff verification and assessment of symptoms in 4 weeks.   Thank you, Adam Clements, [facility]  02/22/2018 4:20 PM

## 2018-02-22 NOTE — Patient Instructions (Addendum)
CONTINUE Northera 200mg  three times a day  Return for follow in 1 month. Please bring your blood pressure log and cuff/machine.

## 2018-02-23 ENCOUNTER — Ambulatory Visit: Payer: Self-pay | Admitting: Internal Medicine

## 2018-02-23 LAB — CBC
HEMATOCRIT: 29.2 % — AB (ref 37.5–51.0)
Hemoglobin: 9.6 g/dL — ABNORMAL LOW (ref 13.0–17.7)
MCH: 31.6 pg (ref 26.6–33.0)
MCHC: 32.9 g/dL (ref 31.5–35.7)
MCV: 96 fL (ref 79–97)
PLATELETS: 254 10*3/uL (ref 150–450)
RBC: 3.04 x10E6/uL — ABNORMAL LOW (ref 4.14–5.80)
RDW: 12 % — AB (ref 12.3–15.4)
WBC: 4.6 10*3/uL (ref 3.4–10.8)

## 2018-02-23 LAB — IRON,TIBC AND FERRITIN PANEL
Ferritin: 178 ng/mL (ref 30–400)
IRON: 78 ug/dL (ref 38–169)
Iron Saturation: 31 % (ref 15–55)
TIBC: 250 ug/dL (ref 250–450)
UIBC: 172 ug/dL (ref 111–343)

## 2018-04-01 ENCOUNTER — Encounter: Payer: Self-pay | Admitting: Family Medicine

## 2018-04-01 NOTE — Progress Notes (Signed)
BP 118/74 (BP Location: Right Arm, Cuff Size: Normal)   Pulse 63   Temp 97.6 F (36.4 C) (Oral)   Ht 5' 8.5" (1.74 m)   Wt 149 lb 12 oz (67.9 kg)   SpO2 96%   BMI 22.44 kg/m    CC: 6 mo f/u visit Subjective:    Patient ID: Adam Clements, male    DOB: [birth year], 84 y.o.   MRN: [id number]  HPI: Adam Clements is a 84 y.o. male presenting on 04/02/2018 for Follow-up (Here for 6 mo f/u. )   Has been seen EP and [facility] (Dr Fenton Malling) for orthostatic hypotension on northera 200mg  TID. Notes reviewed. Dx with autonomic failure probably neurogenic orthostatic hypotension. He now sits on edge of bed 30 min every morning after taking AM northera. No recent syncopal episodes over the last several weeks. Remains concerned about ongoing loss of muscle mass. Continues going to gym regularly, drinks atkins protein shake intermittently. BP elevated today - better on repeat. 4  Worsening anemia noted - had normal iron studies last month. Endorses paresthesias and worsening fatigue over last few weeks.   He did see neurology for neurocardiogenic orthostatic syncope 12/2016 ([place]).   Last syncopal episode 3-4 months ago - hit L arm and worried he had tendon injury. Denies arm pain.   Wants to return to volunteering at [employer] - cautioned against working at heights.   Hearing loss - uses hearing aides regularly. Feels he is stable from this standpoint.      Relevant past medical, surgical, family and social history reviewed and updated as indicated. Interim medical history since our last visit reviewed. Allergies and medications reviewed and updated. Outpatient Medications Prior to Visit  Medication Sig Dispense Refill  . cholecalciferol (VITAMIN D) 1000 units tablet Take 1,000 Units by mouth daily.    Marland Kitchen dipyridamole-aspirin (AGGRENOX) 200-25 MG 12hr capsule Take 1 capsule by mouth 2 (two) times daily. 60 capsule 11  . Droxidopa (NORTHERA) 200 MG CAPS Take 200 mg by mouth  3 (three) times daily.    . pravastatin (PRAVACHOL) 20 MG tablet Take 1 tablet (20 mg total) by mouth daily. 30 tablet 11  . vitamin B-12 (CYANOCOBALAMIN) 500 MCG tablet Take 500 mcg by mouth daily.    . Droxidopa (NORTHERA) 100 MG CAPS Take 1 capsule by mouth 3 (three) times daily.      No facility-administered medications prior to visit.      Per HPI unless specifically indicated in ROS section below Review of Systems Objective:    BP 118/74 (BP Location: Right Arm, Cuff Size: Normal)   Pulse 63   Temp 97.6 F (36.4 C) (Oral)   Ht 5' 8.5" (1.74 m)   Wt 149 lb 12 oz (67.9 kg)   SpO2 96%   BMI 22.44 kg/m   Wt Readings from Last 3 Encounters:  04/02/18 149 lb 12 oz (67.9 kg)  02/08/18 148 lb (67.1 kg)  01/06/18 143 lb (64.9 kg)    Physical Exam Vitals signs and nursing note reviewed.  Constitutional:      Appearance: Normal appearance. He is not ill-appearing.  HENT:     Head: Normocephalic and atraumatic.     Mouth/Throat:     Mouth: Mucous membranes are moist.     Pharynx: Oropharynx is clear.  Neck:     Thyroid: No thyromegaly.  Cardiovascular:     Rate and Rhythm: Normal rate and regular rhythm.     Pulses:  Normal pulses.     Heart sounds: Normal heart sounds. No murmur.  Pulmonary:     Effort: Pulmonary effort is normal. No respiratory distress.     Breath sounds: Normal breath sounds. No wheezing, rhonchi or rales.  Musculoskeletal:     Right lower leg: No edema.     Left lower leg: No edema.     Comments: Nontender bulging mass R anterior distal upper arm  No pain at humerus or at forearm at insertion of biceps tendon Chronic biceps tendon rupture on left  Skin:    General: Skin is warm and dry.     Findings: No rash.  Neurological:     Mental Status: He is alert.  Psychiatric:        Mood and Affect: Mood normal.       Results for orders placed or performed in visit on 04/02/18  Vitamin B12  Result Value Ref Range   Vitamin B-12 1,256 (H) 211 -  911 pg/mL  Folate  Result Value Ref Range   Folate 16.4 >5.9 ng/mL  Comprehensive metabolic panel  Result Value Ref Range   Sodium 130 (L) 135 - 145 mEq/L   Potassium 4.9 3.5 - 5.1 mEq/L   Chloride 97 96 - 112 mEq/L   CO2 26 19 - 32 mEq/L   Glucose, Bld 92 70 - 99 mg/dL   BUN 25 (H) 6 - 23 mg/dL   Creatinine, Ser 1.33 0.40 - 1.50 mg/dL   Total Bilirubin 0.4 0.2 - 1.2 mg/dL   Alkaline Phosphatase 54 39 - 117 U/L   AST 14 0 - 37 U/L   ALT 12 0 - 53 U/L   Total Protein 5.9 (L) 6.0 - 8.3 g/dL   Albumin 3.5 3.5 - 5.2 g/dL   Calcium 9.1 8.4 - 10.5 mg/dL   GFR 54.72 (L) >60.00 mL/min  CBC with Differential/Platelet  Result Value Ref Range   WBC 6.3 4.0 - 10.5 K/uL   RBC 3.33 (L) 4.22 - 5.81 Mil/uL   Hemoglobin 11.0 (L) 13.0 - 17.0 g/dL   HCT 31.8 (L) 39.0 - 52.0 %   MCV 95.6 78.0 - 100.0 fl   MCHC 34.7 30.0 - 36.0 g/dL   RDW 12.4 11.5 - 15.5 %   Platelets 239.0 150.0 - 400.0 K/uL   Neutrophils Relative % 70.8 43.0 - 77.0 %   Lymphocytes Relative 14.2 12.0 - 46.0 %   Monocytes Relative 10.0 3.0 - 12.0 %   Eosinophils Relative 4.5 0.0 - 5.0 %   Basophils Relative 0.5 0.0 - 3.0 %   Neutro Abs 4.5 1.4 - 7.7 K/uL   Lymphs Abs 0.9 0.7 - 4.0 K/uL   Monocytes Absolute 0.6 0.1 - 1.0 K/uL   Eosinophils Absolute 0.3 0.0 - 0.7 K/uL   Basophils Absolute 0.0 0.0 - 0.1 K/uL  TSH  Result Value Ref Range   TSH 2.52 0.35 - 4.50 uIU/mL   Assessment & Plan:   Problem List Items Addressed This Visit    Syncope due to orthostatic hypotension    Now on Northera and seems stable. BPs continue to fluctuate but no recent syncope. Cautioned against working at [employer] at [employer].      Relevant Medications   Droxidopa (NORTHERA) 200 MG CAPS   Normocytic anemia - Primary    Progressive anemia noted, could contribute to worsening fatigue/malaise endorsed over last few months. Iron panel normal last month with ferritin 170s, previous oral iron trial was not tolerated (  2013). Check  further anemia panel today including peripheral smear. ?myelodysplasia.       Relevant Orders   Vitamin B12 (Completed)   Folate (Completed)   Pathologist smear review   Comprehensive metabolic panel (Completed)   CBC with Differential/Platelet (Completed)   TSH (Completed)   Inadequate oral nutritional intake    As evidenced by low protein levels - encouraged regular nutritional supplement shake.       Decreased stamina   Relevant Orders   Vitamin B12 (Completed)   Folate (Completed)   Biceps tendon rupture, right, initial encounter    Presumed biceps tendon rupture. New, occurred 3+ months ago. Currently no pain. Offered ortho referral - pt declines at this time as would not want treatment.       Biceps tendon rupture, left, sequela    H/o this       Other Visit Diagnoses    Fatigue, unspecified type       Relevant Orders   Vitamin B12 (Completed)   Folate (Completed)       No orders of the defined types were placed in this encounter.  Orders Placed This Encounter  Procedures  . Vitamin B12  . Folate  . Pathologist smear review  . Comprehensive metabolic panel  . CBC with Differential/Platelet  . TSH   Patient Instructions  Good to see you today. Labs today for further evaluation of anemia.  Return in 6 months for wellness visit.  Continue medicines. I think you had R biceps tendon rupture with residual lump at arm from torn muscle. As no pain and not affecting strength, we can watch for now. Let me know if worsening pain or enlarging or changing.  Continue supplement (boost or ensure).    Follow up plan: No follow-ups on file.  Ria Bush, MD

## 2018-04-02 ENCOUNTER — Encounter: Payer: Self-pay | Admitting: Family Medicine

## 2018-04-02 ENCOUNTER — Ambulatory Visit: Payer: Self-pay | Admitting: Family Medicine

## 2018-04-02 ENCOUNTER — Ambulatory Visit (INDEPENDENT_AMBULATORY_CARE_PROVIDER_SITE_OTHER): Payer: Medicare Other | Admitting: Family Medicine

## 2018-04-02 VITALS — BP 118/74 | HR 63 | Temp 97.6°F | Ht 68.5 in | Wt 149.8 lb

## 2018-04-02 DIAGNOSIS — D649 Anemia, unspecified: Secondary | ICD-10-CM

## 2018-04-02 DIAGNOSIS — S46212S Strain of muscle, fascia and tendon of other parts of biceps, left arm, sequela: Secondary | ICD-10-CM | POA: Diagnosis not present

## 2018-04-02 DIAGNOSIS — E639 Nutritional deficiency, unspecified: Secondary | ICD-10-CM

## 2018-04-02 DIAGNOSIS — S46211A Strain of muscle, fascia and tendon of other parts of biceps, right arm, initial encounter: Secondary | ICD-10-CM | POA: Diagnosis not present

## 2018-04-02 DIAGNOSIS — R638 Other symptoms and signs concerning food and fluid intake: Secondary | ICD-10-CM | POA: Diagnosis not present

## 2018-04-02 DIAGNOSIS — I951 Orthostatic hypotension: Secondary | ICD-10-CM | POA: Diagnosis not present

## 2018-04-02 DIAGNOSIS — R5383 Other fatigue: Secondary | ICD-10-CM

## 2018-04-02 LAB — COMPREHENSIVE METABOLIC PANEL
ALT: 12 U/L (ref 0–53)
AST: 14 U/L (ref 0–37)
Albumin: 3.5 g/dL (ref 3.5–5.2)
Alkaline Phosphatase: 54 U/L (ref 39–117)
BUN: 25 mg/dL — ABNORMAL HIGH (ref 6–23)
CHLORIDE: 97 meq/L (ref 96–112)
CO2: 26 mEq/L (ref 19–32)
Calcium: 9.1 mg/dL (ref 8.4–10.5)
Creatinine, Ser: 1.33 mg/dL (ref 0.40–1.50)
GFR: 54.72 mL/min — ABNORMAL LOW (ref >60.00)
GLUCOSE: 92 mg/dL (ref 70–99)
POTASSIUM: 4.9 meq/L (ref 3.5–5.1)
SODIUM: 130 meq/L — AB (ref 135–145)
TOTAL PROTEIN: 5.9 g/dL — AB (ref 6.0–8.3)
Total Bilirubin: 0.4 mg/dL (ref 0.2–1.2)

## 2018-04-02 LAB — CBC WITH DIFFERENTIAL/PLATELET
Basophils Absolute: 0 10*3/uL (ref 0.0–0.1)
Basophils Relative: 0.5 % (ref 0.0–3.0)
Eosinophils Absolute: 0.3 10*3/uL (ref 0.0–0.7)
Eosinophils Relative: 4.5 % (ref 0.0–5.0)
HCT: 31.8 % — ABNORMAL LOW (ref 39.0–52.0)
Hemoglobin: 11 g/dL — ABNORMAL LOW (ref 13.0–17.0)
LYMPHS ABS: 0.9 10*3/uL (ref 0.7–4.0)
Lymphocytes Relative: 14.2 % (ref 12.0–46.0)
MCHC: 34.7 g/dL (ref 30.0–36.0)
MCV: 95.6 fl (ref 78.0–100.0)
Monocytes Absolute: 0.6 10*3/uL (ref 0.1–1.0)
Monocytes Relative: 10 % (ref 3.0–12.0)
NEUTROS PCT: 70.8 % (ref 43.0–77.0)
Neutro Abs: 4.5 10*3/uL (ref 1.4–7.7)
Platelets: 239 10*3/uL (ref 150.0–400.0)
RBC: 3.33 Mil/uL — ABNORMAL LOW (ref 4.22–5.81)
RDW: 12.4 % (ref 11.5–15.5)
WBC: 6.3 10*3/uL (ref 4.0–10.5)

## 2018-04-02 LAB — VITAMIN B12: Vitamin B-12: 1256 pg/mL — ABNORMAL HIGH (ref 211–911)

## 2018-04-02 LAB — FOLATE: FOLATE: 16.4 ng/mL (ref >5.9)

## 2018-04-02 LAB — TSH: TSH: 2.52 u[IU]/mL (ref 0.35–4.50)

## 2018-04-02 NOTE — Patient Instructions (Addendum)
Good to see you today. Labs today for further evaluation of anemia.  Return in 6 months for wellness visit.  Continue medicines. I think you had R biceps tendon rupture with residual lump at arm from torn muscle. As no pain and not affecting strength, we can watch for now. Let me know if worsening pain or enlarging or changing.  Continue supplement (boost or ensure).

## 2018-04-03 DIAGNOSIS — R638 Other symptoms and signs concerning food and fluid intake: Secondary | ICD-10-CM | POA: Insufficient documentation

## 2018-04-03 DIAGNOSIS — E639 Nutritional deficiency, unspecified: Secondary | ICD-10-CM | POA: Insufficient documentation

## 2018-04-03 NOTE — Assessment & Plan Note (Signed)
Progressive anemia noted, could contribute to worsening fatigue/malaise endorsed over last few months. Iron panel normal last month with ferritin 170s, previous oral iron trial was not tolerated (2013). Check further anemia panel today including peripheral smear. ?myelodysplasia.

## 2018-04-03 NOTE — Assessment & Plan Note (Signed)
H/o this.  ?

## 2018-04-03 NOTE — Assessment & Plan Note (Signed)
As evidenced by low protein levels - encouraged regular nutritional supplement shake.

## 2018-04-03 NOTE — Assessment & Plan Note (Signed)
Now on Northera and seems stable. BPs continue to fluctuate but no recent syncope. Cautioned against working at [employer] at [employer].

## 2018-04-03 NOTE — Assessment & Plan Note (Signed)
Presumed biceps tendon rupture. New, occurred 3+ months ago. Currently no pain. Offered ortho referral - pt declines at this time as would not want treatment.

## 2018-04-05 LAB — PATHOLOGIST SMEAR REVIEW

## 2018-04-12 ENCOUNTER — Ambulatory Visit (INDEPENDENT_AMBULATORY_CARE_PROVIDER_SITE_OTHER): Payer: Medicare Other | Admitting: Pharmacist

## 2018-04-12 VITALS — BP 152/86 | HR 66

## 2018-04-12 DIAGNOSIS — I951 Orthostatic hypotension: Secondary | ICD-10-CM | POA: Diagnosis not present

## 2018-04-12 NOTE — Patient Instructions (Addendum)
It was good to see you today  SKIP your third dose today  CONTINUE taking your first dose of 2 tablets (200 mg) when you wake up at 6 AM  DECREASE your second dose of 1 tablet (100 mg) at 10 AM  CONTINUE taking your third dose of 2 tablets (200 mg) in the afternoon at 4 PM  Monitor your blood pressure at home and bring the blood pressure log to your next follow up visit

## 2018-04-12 NOTE — Progress Notes (Signed)
Patient ID: TAYSHAUN KROH                 DOB: 1934-05-21                      MRN: [id number]     HPI: Adam Clements is a 84 y.o. male patient of Dr. Caryl Comes who presents today for hypotension follow up. PMH significant for profound orthostasis with lightheadedness and fatigue.  It is somewhat delayed occurring a few minutes after standing.  He has had syncope.  He was intolerant of Mestinon.  He found no benefit from compressive wear.  He has been tried on [employer]. He continues to struggle. He is now on Northera.  He was intolerant of the doses of 300 mg because of confusion and possible associated hypertension.  It is his impression that he is better on Northera than off. Dose was reduced down to 100 mg TID but was increased to 200 mg TID by Dr. Caryl Comes in November 2019. He underwent testing at [facility] under the care of Dr. Fenton Malling; results were consistent with autonomic failure--likely a neurogenic orthostatic hypotension per Dr. Caryl Comes. Since the last visit in clinic, patient was seen in [facility] by Dr. Danise Mina - BP was stable and anemia improved a little but kidney function was slightly impaired and patient was hyponatremic. Today patient presents for follow up visit.  Patient is in good spirits today. Patient denies confusion on Northera and has not had any falls since the last visit. However, patient reports mild lightheadedness when sitting up (lightheadedness may be delayed), especially in the morning before his first Northera dose starts to work. Patient also reports forgetting to take his third dose about twice a week and on days when he forgets the third dose, he notices that his BP runs lower and feels more dizzy. He also reports that he feels particularly more dizzy when he wakes up in the morning. He currently takes his doses at 7 am, 10 am, and 3 pm. He also mentioned that if he forgets to take his 3rd dose until after 5pm, he will only take 100mg . He monitors BP at home - both  sitting and standing- but did not bring his log and cuff to the visit today. Patient recalls his average BP (see below). His current co-pay for Lauree Chandler is $0.  In clinic, BP runs high. Sitting BP 190/94 and standing BP152/86, which read much higher than the average BP that patient recalls (see below). Patient denies headache and vision changes in clinic and at home. He took his Northera doses at 7 am and 10 am dose before coming to the clinic. Patient denies stress, salt heavy diet, and use of NSAIDs for pain.  Current BP meds:  Northera 200mg  TID  Previously tried: Mestinon, proamatine  Family History: mother - CHF, HTN, father - HTN, hemorrhagic stroke; brother - HLD, heart disease before age 70, HTN, MI  Social History: former smoker, 7 glasses of wine per week.   Diet: He eats most meals at home. Eats out on days goes to the gym. He drinks mostly tea coffee and water.   Exercise: He goes to the gym three times per week.   Home BP readings: patient recalls sitting 140s/60s and standing 100s/60s  Wt Readings from Last 3 Encounters:  04/02/18 149 lb 12 oz (67.9 kg)  02/08/18 148 lb (67.1 kg)  01/06/18 143 lb (64.9 kg)   BP Readings from Last 3  Encounters:  04/02/18 118/74  02/22/18 138/84  02/08/18 (!) 181/86   Pulse Readings from Last 3 Encounters:  04/02/18 63  02/22/18 (!) 59  02/08/18 63    Renal function: Estimated Creatinine Clearance: 41.8 mL/min (by C-G formula based on SCr of 1.33 mg/dL).  Past Medical History:  Diagnosis Date  . Allergic rhinitis   . Arthritis    Gout  . BPH (benign prostatic hypertrophy)   . Bradycardia   . CAD (coronary artery disease)    nonobstructive  . Chronic anemia 2013   h/o IDA, did not tolerate oral iron, latest normal iron studies, B12, folate  . Diverticulosis of colon    conoscopy 12/1993 (Dr. Amedeo Plenty) S/G divertics//colonoscopy L&R divertics (Dr. Amedeo Plenty) 11/08/2004  . Dizziness   . Gout   . HTN (hypertension)   . Memory  loss   . Mitral regurgitation    Pulmonic regurgitation, with marked LA enlargement  . Nonischemic cardiomyopathy (HCC)    EF 40%  . Orthostatic hypotension   . PVC (premature ventricular contraction)   . PVD (peripheral vascular disease) ([place])    followed by Dr Trula Slade, treating medically (pletal and aspirin)  . RBBB longstanding  . Squamous cell carcinoma in situ of skin 2015   L ear  . Syncope     Current Outpatient Medications on File Prior to Visit  Medication Sig Dispense Refill  . cholecalciferol (VITAMIN D) 1000 units tablet Take 1,000 Units by mouth daily.    Marland Kitchen dipyridamole-aspirin (AGGRENOX) 200-25 MG 12hr capsule Take 1 capsule by mouth 2 (two) times daily. 60 capsule 11  . Droxidopa (NORTHERA) 200 MG CAPS Take 200 mg by mouth 3 (three) times daily.    . pravastatin (PRAVACHOL) 20 MG tablet Take 1 tablet (20 mg total) by mouth daily. 30 tablet 11  . vitamin B-12 (CYANOCOBALAMIN) 500 MCG tablet Take 500 mcg by mouth daily.     No current facility-administered medications on file prior to visit.     Allergies  Allergen Reactions  . Atorvastatin Other (See Comments)    Memory trouble, confusion, fatigue  . Pletal [Cilostazol]     There were no vitals taken for this visit.    Assessment/Plan:  1. Hypotension: BP is elevated today and runs higher than the readings noted in previous visits in clinic and recalled home BP, potentially due to close timing of 1st and 2nd Northera doses. Pt is not able to move back his 2nd dose due to his scheduling. Will decrease the second dose of Northera to 100 mg daily at 10 AM and continue the first and third doses at 200mg , however will adjust timing of these doses. Advised the patient to take the first dose as soon as he wakes up (6 AM) and the third dose later in the day (4 PM) since he feels particularly dizzy in the morning and before bedtime. Also instructed the patient to skip the third dose today as BP is running high in clinic.  Advised the patient to check BP daily at random time of the day and to bring BP log at the next visit. He will return for follow up in pharmacy clinic for home BP log assessment in 2 weeks.   Seen in clinic by Almira Bar, Pharm D Candidate  Lailana Shira E. Avyay Coger, PharmD, BCACP, [place] [address] [place], [place] [address] Phone: [phone]; Fax: [phone] 04/12/2018 2:59 PM

## 2018-04-27 ENCOUNTER — Encounter: Payer: Self-pay | Admitting: Pharmacist

## 2018-04-27 ENCOUNTER — Ambulatory Visit (INDEPENDENT_AMBULATORY_CARE_PROVIDER_SITE_OTHER): Payer: Medicare Other | Admitting: Pharmacist

## 2018-04-27 VITALS — BP 160/60

## 2018-04-27 DIAGNOSIS — I951 Orthostatic hypotension: Secondary | ICD-10-CM | POA: Diagnosis not present

## 2018-04-27 NOTE — Patient Instructions (Signed)
Keep your dose of Northera the same. 200mg  at 5 AM, 100mg  at 11Am and 200mg  at 5PM.  Your appointment with Dr. Caryl Comes is on Monday Feb 17 @ 8:15AM  Call us at [phone] with any questions or concerns or with increased episodes of blacking out.

## 2018-04-27 NOTE — Progress Notes (Signed)
Patient ID: Adam Clements                 DOB: 11-28-1934                      MRN: [id number]     HPI: BROCKTON MCKESSON is a 84 y.o. male patient of Dr. Caryl Comes who presents today for hypotension follow up. PMH significant for profound orthostasis with lightheadedness and fatigue.  It is somewhat delayed occurring a few minutes after standing.  He has had syncope.  He was intolerant of Mestinon.  He found no benefit from compressive wear.  He has been tried on [employer]. He continues to struggle. He is now on Northera.  He was intolerant of the doses of 300 mg because of confusion and possible associated hypertension.  It is his impression that he is better on Northera than off. Dose was reduced down to 100 mg TID but was increased to 200 mg TID by Dr. Caryl Comes in November 2019. He underwent testing at [facility] under the care of Dr. Fenton Malling; results were consistent with autonomic failure--likely a neurogenic orthostatic hypotension per Dr. Caryl Comes.  At least visit just a few weeks ago, patients Northera dose and timing of medications was adjusted due to hypertension and increased dizziness in the morning. Patient is now taking Northera 200 @ 5-6 AM, Northera 100mg  10-11AM, 200mg  @ 4-5PMPM. He states that he feels well between 11AM and 5 PM, that is when he really gets going and grooving. He did have 1 episode of passing out last week. It occurred around 5PM while he was working in the barn with a bail of hay. Says he may have exerted himself too much. Patient also reports increased confusion, decrease in stamina, decrease in confidence in his physical abilities, and memory issues, especially when doing his financials. Patient did appear a little confused and repeated himself a few times towards the end of the visit.  Patient states that he feels like the confusion and the passing out are related. Feels as though he starts to feel confused, sits down and the feeling of passing out goes away. This seems to we  worse at bedtime.     Current BP meds:  Northera 200 @ 5-6 AM, Northera 100mg  10-11AM, 200mg  @ 4:00PM  Previously tried: Mestinon, proamatine  Family History: mother - CHF, HTN, father - HTN, hemorrhagic stroke; brother - HLD, heart disease before age 10, HTN, MI  Social History: former smoker, 7 glasses of wine per week.   Diet: He eats most meals at home. Eats out on days goes to the gym. He drinks mostly tea coffee and water.   Exercise: He goes to the gym three times per week.   Home BP readings: patient recalls sitting 140s/60s and standing 100s/60s  Wt Readings from Last 3 Encounters:  04/02/18 149 lb 12 oz (67.9 kg)  02/08/18 148 lb (67.1 kg)  01/06/18 143 lb (64.9 kg)   BP Readings from Last 3 Encounters:  04/27/18 (!) 160/60  04/12/18 (!) 152/86  04/02/18 118/74   Pulse Readings from Last 3 Encounters:  04/12/18 66  04/02/18 63  02/22/18 (!) 59    Renal function: CrCl cannot be calculated (Patient's most recent lab result is older than the maximum 21 days allowed.).  Past Medical History:  Diagnosis Date  . Allergic rhinitis   . Arthritis    Gout  . BPH (benign prostatic hypertrophy)   . Bradycardia   .  CAD (coronary artery disease)    nonobstructive  . Chronic anemia 2013   h/o IDA, did not tolerate oral iron, latest normal iron studies, B12, folate  . Diverticulosis of colon    conoscopy 12/1993 (Dr. Amedeo Plenty) S/G divertics//colonoscopy L&R divertics (Dr. Amedeo Plenty) 11/08/2004  . Dizziness   . Gout   . HTN (hypertension)   . Memory loss   . Mitral regurgitation    Pulmonic regurgitation, with marked LA enlargement  . Nonischemic cardiomyopathy (HCC)    EF 40%  . Orthostatic hypotension   . PVC (premature ventricular contraction)   . PVD (peripheral vascular disease) ([place])    followed by Dr Trula Slade, treating medically (pletal and aspirin)  . RBBB longstanding  . Squamous cell carcinoma in situ of skin 2015   L ear  . Syncope     Current  Outpatient Medications on File Prior to Visit  Medication Sig Dispense Refill  . cholecalciferol (VITAMIN D) 1000 units tablet Take 1,000 Units by mouth daily.    Marland Kitchen dipyridamole-aspirin (AGGRENOX) 200-25 MG 12hr capsule Take 1 capsule by mouth 2 (two) times daily. 60 capsule 11  . Droxidopa (NORTHERA) 200 MG CAPS Take 200 mg by mouth 3 (three) times daily. Take 200mg  in the morning, 100mg  at lunch, and 200mg  in the late afternoon.    . pravastatin (PRAVACHOL) 20 MG tablet Take 1 tablet (20 mg total) by mouth daily. 30 tablet 11  . vitamin B-12 (CYANOCOBALAMIN) 500 MCG tablet Take 500 mcg by mouth daily.     No current facility-administered medications on file prior to visit.     Allergies  Allergen Reactions  . Atorvastatin Other (See Comments)    Memory trouble, confusion, fatigue  . Pletal [Cilostazol]     Blood pressure (!) 160/60.    Assessment/Plan:  1. Hypotension: patient has had one event of passing out. He was exerting himself a lot and he was nearing his time to re dose his medication. Other than that event patient does not have any complaints as far as hypotension/passing out is concerned. I do not feel any dose changes need to be made at this time.   2. Memory impairment/confusion: This is the patients biggest complaint. Although confusion and memory impairment is listed as a post marketing side effect of Northera, I am not completely convinced this is from medication.  There is good potential this is disease related. I have made his an appointment with Dr. Jens Som to discuss the progression of his disease. We will be happy to see the patient back in clinic if Dr. Caryl Comes feels as though a dose decrease is warranted.  Ramond Dial, Pharm.D, [place]  [address] [place], [place] [address]  Phone: [phone]; Fax: [phone]

## 2018-04-30 ENCOUNTER — Ambulatory Visit: Payer: Self-pay

## 2018-05-07 ENCOUNTER — Encounter: Payer: Self-pay | Admitting: Internal Medicine

## 2018-05-10 ENCOUNTER — Encounter: Payer: Self-pay | Admitting: Internal Medicine

## 2018-05-10 ENCOUNTER — Ambulatory Visit (INDEPENDENT_AMBULATORY_CARE_PROVIDER_SITE_OTHER): Payer: Medicare Other | Admitting: Internal Medicine

## 2018-05-10 VITALS — BP 197/93 | HR 67 | Ht 68.5 in | Wt 150.0 lb

## 2018-05-10 DIAGNOSIS — I951 Orthostatic hypotension: Secondary | ICD-10-CM | POA: Diagnosis not present

## 2018-05-10 DIAGNOSIS — I428 Other cardiomyopathies: Secondary | ICD-10-CM

## 2018-05-10 NOTE — Patient Instructions (Addendum)
Medication Instructions:  Your physician recommends that you continue on your current medications as directed. Please refer to the Current Medication list given to you today.  Labwork: None ordered.  Testing/Procedures: None ordered.  Follow-Up: Your physician recommends that you schedule a follow-up appointment in:   4 months with Dr Caryl Comes    Any Other Special Instructions Will Be Listed Below (If Applicable).   Dr Caryl Comes would like you to begin wearing "Thigh High Compression" at 30-24mmHg   If you need a refill on your cardiac medications before your next appointment, please call your pharmacy.

## 2018-05-10 NOTE — Progress Notes (Signed)
ELECTROPHYSIOLOGY OFFICE NOTE  Patient ID: Adam Clements, MRN: [id number], DOB/AGE: 1935/01/05 84 y.o. Admit date: (Not on file) Date of Consult: 05/10/2018  Primary Physician: Adam Bush, MD Primary Cardiologist: Adam Clements is a 84 y.o. male who is being seen today for the evaluation of orthostatic hypotension at the request of himself and wife.    HPI Adam Clements is a 84 y.o. male seen today because of profound orthostasis with lightheadedness and fatigue.  It is somewhat delayed occurring a few minutes after standing.  He has had syncope.    We have looked for evidence of amyloid but have found little>> borderline PYP ; myeloma panel>>M prot 0.3  FLC assay neg    IFE urine neg  Discussed with Adam Clements  He does have a history of hypertension. He does not have problems with bowel function.  He is having progressive problems with dry mouth.  No problems with bowel function and stable issues with bladder function.    He continues to struggle.  He was intolerant of Mestinon.  He found no benefit from a compressive wear.  He has been tried on [employer].  He is now on Northera.  He was intolerant of the doses of 300 mg because of confusion and possible associated hypertension.  It is his impression that he is better all on more care than off.    He underwent testing at [facility] under the care of Adam Clements; results were consistent with autonomic failure--probably a neurogenic orthostatic hypotension definitely.   He is not aware of why he is on dual antiplatelet therapy;>> review of his chart identifies a prior TIA with expressive aphasia which he subsequently acknowledges.  He continues to struggle with orthostatic intolerance.  He notes progressive weakness, trepidation with standing, reluctance to engage in activities with others because of fear of his own falling or the impact on them, and more recently short-term memory  He has had 2 interval  syncopal episodes.  He is using compressive stockings we presume 20-30 mm.  Not using abdominal binder.  Head of bed is elevated.    DATE TEST EF   6/17 MYOVIEW  36 %   4/17 Echo   45-50% LAE severe  10/19 HUT   today some some of those were all of them using them up back to 73mm thank you   Date Cr Hgb  7/19 1.16 11.8  10/19 1.5 10.4      Past Medical History:  Diagnosis Date  . Allergic rhinitis   . Arthritis    Gout  . BPH (benign prostatic hypertrophy)   . Bradycardia   . CAD (coronary artery disease)    nonobstructive  . Chronic anemia 2013   h/o IDA, did not tolerate oral iron, latest normal iron studies, B12, folate  . Diverticulosis of colon    conoscopy 12/1993 (Adam. Amedeo Clements) S/G divertics//colonoscopy L&R divertics (Adam. Amedeo Clements) 11/08/2004  . Dizziness   . Gout   . HTN (hypertension)   . Memory loss   . Mitral regurgitation    Pulmonic regurgitation, with marked LA enlargement  . Nonischemic cardiomyopathy (HCC)    EF 40%  . Orthostatic hypotension   . PVC (premature ventricular contraction)   . PVD (peripheral vascular disease) ([place])    followed by Adam Clements, treating medically (pletal and aspirin)  . RBBB longstanding  . Squamous cell carcinoma in situ of skin 2015   L ear  .  Syncope       Surgical History:  Past Surgical History:  Procedure Laterality Date  . 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 APPENDECTOMY    . dexa  2004   "shrunk 2 inches", WNL    Current Meds  Medication Sig  . cholecalciferol (VITAMIN D) 1000 units tablet Take 1,000 Units by mouth daily.  Marland Kitchen dipyridamole-aspirin (AGGRENOX) 200-25 MG 12hr capsule Take 1 capsule by mouth 2 (two) times daily.  . Droxidopa (NORTHERA) 200 MG CAPS Take 200 mg by mouth 3 (three) times daily. Take 200mg  in the morning, 100mg  at lunch, and 200mg  in the late afternoon.  . pravastatin (PRAVACHOL) 20 MG tablet Take 1 tablet (20 mg total) by mouth daily.  . vitamin B-12 (CYANOCOBALAMIN) 500  MCG tablet Take 500 mcg by mouth daily.      Allergies:  Allergies  Allergen Reactions  . Atorvastatin Other (See Comments)    Memory trouble, confusion, fatigue  . Pletal [Cilostazol]     Social History   Socioeconomic History  . Marital status: Married    Spouse name: Not on file  . Number of children: Not on file  . Years of education: Not on file  . Highest education level: Not on file  Occupational History  . Not on file  Social Needs  . Financial resource strain: Not on file  . Food insecurity:    Worry: Not on file    Inability: Not on file  . Transportation needs:    Medical: Not on file    Non-medical: Not on file  Tobacco Use  . Smoking status: Former Smoker    Packs/day: 2.00    Types: Cigarettes    Last attempt to quit: 11/10/1982    Years since quitting: 35.5  . Smokeless tobacco: Former Network engineer and Sexual Activity  . Alcohol use: Yes    Alcohol/week: 7.0 standard drinks    Types: 7 Glasses of wine per week    Comment: 1 glass of wine daily. History of heavy drinking prior to 2005.  . Drug use: No  . Sexual activity: Never  Lifestyle  . Physical activity:    Days per week: Not on file    Minutes per session: Not on file  . Stress: Not on file  Relationships  . Social connections:    Talks on phone: Not on file    Gets together: Not on file    Attends religious service: Not on file    Active member of club or organization: Not on file    Attends meetings of clubs or organizations: Not on file    Relationship status: Not on file  . Intimate partner violence:    Fear of current or ex partner: Not on file    Emotionally abused: Not on file    Physically abused: Not on file    Forced sexual activity: Not on file  Other Topics Concern  . Not on file  Social History Narrative   Lives with wife Adam Clements). Dog passed away 17-Nov-2012.   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 HCPOA is wife then son and then oldest daughter etc. Advanced directives in chart 2012-11-17). Does not want artificial nutrition/hydration or prolonged life support  Does have stairs in home. BS Degree     Family History  Problem Relation Age of Onset  . Heart disease Mother 65       Natural causes with CHF  . Hypertension Mother   . Hypertension Father   . Stroke Father        cerebral hemorrhage  . Hyperlipidemia Brother   . Heart disease Brother        before age 72  . Hypertension Brother   . Heart attack Brother   . Hypertension Other      ROS:  Please see the history of present illness.     All other systems reviewed and negative.   Physical Exam: Blood pressure (!) 181/86, pulse 63, height 5\' 9"  (1.753 m), weight 148 lb (67.1 kg), SpO2 96 %.  Well developed and nourished in no acute distress HENT normal Neck supple with JVP-flat Clear Regular rate and rhythm, no murmurs or gallops Abd-soft with active BS No Clubbing cyanosis edema Skin-warm and dry A & Oriented  Grossly normal sensory and motor function    Labs: Cardiac Enzymes No results for input(s): CKTOTAL, CKMB, TROPONINI in the last 72 hours. CBC Lab Results  Component Value Date   WBC 6.3 04/02/2018   HGB 11.0 (L) 04/02/2018   HCT 31.8 (L) 04/02/2018   MCV 95.6 04/02/2018   PLT 239.0 04/02/2018   PROTIME: No results for input(s): LABPROT, INR in the last 72 hours. Chemistry  No results for input(s): NA, K, CL, CO2, BUN, CREATININE, CALCIUM, PROT, BILITOT, ALKPHOS, ALT, AST, GLUCOSE in the last 168 hours.  Invalid input(s): LABALBU Lipids Lab Results  Component Value Date   CHOL 137 09/16/2017   HDL 58.60 09/16/2017   LDLCALC 71 09/16/2017   TRIG 34.0 09/16/2017   BNP No results found for: PROBNP Thyroid  Function Tests: No results for input(s): TSH, T4TOTAL, T3FREE, THYROIDAB in the last 72 hours.  Invalid input(s): FREET3 Miscellaneous No results found for: DDIMER  Radiology/Studies:  No results found.  EKG: none   Assessment and Plan:   Hypertension  Orthostatic hypotension  Cardiomyopathy-nonischemic  Bilateral carotid stenosis  TIA  Autonomic failure      Progressive instability   Reviewed the likely progressive and terminal nature of the condition  Increase compressive wear-- lower extreemity pressure and abdominal binder  Will consider the reintroduction of mestinon or proamatine   Continue DAPT for TIA   More than 50% of 40 min was spent in counseling related to the above   [employer]

## 2018-05-25 ENCOUNTER — Emergency Department (HOSPITAL_COMMUNITY)
Admission: EM | Admit: 2018-05-25 | Discharge: 2018-05-25 | Discharge status: Against Medical Advice | Payer: Medicare Other

## 2018-05-25 ENCOUNTER — Other Ambulatory Visit: Payer: Self-pay

## 2018-05-25 ENCOUNTER — Telehealth: Payer: Self-pay | Admitting: Pharmacist

## 2018-05-25 NOTE — Telephone Encounter (Signed)
Spoke with Adam Clements. Per Dr Olin Pia recommendation, Adam Clements should go to the ED for evaluation. I advised him to have someone drive him since his right leg is numb and he has been having periods of confusion. Adam Clements has verbalized understanding and agrees with plan.

## 2018-05-25 NOTE — ED Notes (Signed)
Called for triage with no answer in lobby

## 2018-05-25 NOTE — Telephone Encounter (Signed)
Spoke with pt without realizing Melissa had spoken with him prior. Pt does not want to visit the ED at this time until we have spoken with Dr Caryl Comes. I advised pt I would be contacting him back with recommendations.

## 2018-05-25 NOTE — Telephone Encounter (Signed)
Patient called and left a message on voicemail that his passing out is my worse since last night and he has numbness/weakness in his left leg. I spoke with Dr. Curt Bears who is DOD today and he recommended patient go to the ER. I called patient, wife answered. Left message for patient to call me back and she stated she would have him call me soon.   Patient called back- provided him with Dr. Curt Bears recommendation. Patient prefers not to go to the hospital if he doesn't have to. States he has been able to do his barn chores this afternoon. I will speak with Dr. Jens Som when he arrives for his recommendation and call him back.

## 2018-06-10 MED ORDER — DROXIDOPA 200 MG PO CAPS: 200.0000 mg | ORAL_CAPSULE | Freq: Three times a day (TID) | ORAL | 2 refills | Status: DC

## 2018-06-15 ENCOUNTER — Telehealth: Payer: Self-pay | Admitting: Internal Medicine

## 2018-06-15 NOTE — Telephone Encounter (Signed)
Pt wants Margarita Grizzle to know that he is experiencing symptoms more severe than he is used to. He wants to talk about a rx to a specialty pharmacy.  Pt c/o of Chest Pain: STAT if CP now or developed within 24 hours  1. Are you having CP right now? Not now  2. Are you experiencing any other symptoms (ex. SOB, nausea, vomiting, sweating)? Just lightheadedness  3. How long have you been experiencing CP? Recently (Less than two weeks)  4. Is your CP continuous or coming and going? Only in the morning and the evening  5. Have you taken Nitroglycerin? no  Has difficulty showering in the morning and has no energy to get ready for bed. Feels very unsure of himself on his feet. Has more details to discuss when you call him back.

## 2018-06-18 NOTE — Telephone Encounter (Signed)
Pt MyChart message: "I do not feel safe enough to take showers recently.My right leg is feeling and acting numb.My strength level is approximately 50% of normal.What action do you recommend"   Called pt regarding above MyChart message in order to get more information.   Pt reports decreased strength throughout the day, lightheadedness in the morning after waking, dizziness and feels unsteady. Reports taking twice as long to complete ADL's. He states these symptoms typically go away after an hour. Pt takes morning meds on an empty stomach. Advised to take with food and increase water intake. Pt denies CP, SOB or swelling. Pt states he began feeling this way after starting new med Northera. Will route to PharmD and Dr. Caryl Comes for advisement. Advised pt to call back if any other acute symptoms present.

## 2018-06-23 MED ORDER — DROXIDOPA 200 MG PO CAPS: 200.0000 mg | ORAL_CAPSULE | Freq: Three times a day (TID) | ORAL | 2 refills | Status: DC

## 2018-06-28 ENCOUNTER — Other Ambulatory Visit: Payer: Self-pay

## 2018-06-28 MED ORDER — DROXIDOPA 200 MG PO CAPS: 200.0000 mg | ORAL_CAPSULE | Freq: Three times a day (TID) | ORAL | 2 refills | Status: DC

## 2018-06-29 MED ORDER — DROXIDOPA 200 MG PO CAPS: ORAL_CAPSULE | ORAL | 2 refills | Status: DC

## 2018-06-29 NOTE — Addendum Note (Signed)
Addended by: Derl Barrow on: 06/29/2018 09:50 AM   Modules accepted: Orders

## 2018-06-30 ENCOUNTER — Telehealth: Payer: Self-pay | Admitting: Internal Medicine

## 2018-06-30 MED ORDER — DROXIDOPA 100 MG PO CAPS: ORAL_CAPSULE | ORAL | 3 refills | Status: DC

## 2018-06-30 NOTE — Telephone Encounter (Signed)
Electronically sent rx for Northera to [facility].

## 2018-06-30 NOTE — Telephone Encounter (Signed)
[facility] is requesting clarification for droxidopa (Northera) 200 mg. Pharmacy stated that this medication is a capsule an can not be split for pt to take 100 mg at lunch. Pharmacy suggested sending in Droxidopa ( Northera) 100 mg capsule, pt taking 2 capsules in morning, 1 capsule at lunch and 2 capsule in late afternoon. Please address

## 2018-07-14 ENCOUNTER — Encounter: Payer: Self-pay | Admitting: Family Medicine

## 2018-07-14 NOTE — Telephone Encounter (Signed)
Plz schedule virtual visit.

## 2018-07-14 NOTE — Telephone Encounter (Signed)
Pt scheduled for 07/15/18 @ 9am

## 2018-07-15 ENCOUNTER — Ambulatory Visit (INDEPENDENT_AMBULATORY_CARE_PROVIDER_SITE_OTHER): Payer: Medicare Other | Admitting: Family Medicine

## 2018-07-15 ENCOUNTER — Encounter: Payer: Self-pay | Admitting: Family Medicine

## 2018-07-15 VITALS — Ht 68.5 in

## 2018-07-15 DIAGNOSIS — J302 Other seasonal allergic rhinitis: Secondary | ICD-10-CM

## 2018-07-15 NOTE — Assessment & Plan Note (Signed)
Anticipate current symptomatology likely related to allergic rhinitis flare. Not consistent with Covid19 symptoms. Supportive care reviewed including nasal saline irrigation, allergen avoidance measures, antihistamine PRN and nasal steroid if needed. Update if not improving with recommended treatment.

## 2018-07-15 NOTE — Progress Notes (Signed)
   Adam Clements - 84 y.o. male  MRN [id number]  Date of Birth: March 19, 1935  PCP: Ria Bush, MD  This service was provided via telemedicine. Phone Visit performed on 07/15/2018    Rationale for phone visit along with limitations reviewed. Patient consented to telephone encounter.    Location of patient: home Location of provider: office, [place] @ [facility] Name of referring provider: N/A   Names of persons and role in encounter: Provider: Ria Bush, MD  Patient: Adam Clements  Other: N/A   Time on call: 9:00am - 9:10am   Subjective: CC: cough HPI:  Several month h/o productive cough of clear mucous, head > chest congestion, sneezing. Rhinorrhea present. He attributes to season/pollen.   No itchy/watery eyes. Does not feel feverish or with chills, no body aches, dyspnea or wheeze. No HA, ST or PNdrainage.  No h/o asthma.  Hasn't tried anything for these symptoms yet.  No sick contacts at home.   He has been working outdoors a lot.   Objective/Observations:   No physical exam or vital signs collected unless specifically identified below.   Ht 5' 8.5" (1.74 m)   BMI 22.48 kg/m    Respiratory status: speaks in complete sentences without evident shortness of breath.   Assessment/Plan:  Allergic rhinitis Anticipate current symptomatology likely related to allergic rhinitis flare. Not consistent with Covid19 symptoms. Supportive care reviewed including nasal saline irrigation, allergen avoidance measures, antihistamine PRN and nasal steroid if needed. Update if not improving with recommended treatment.    I discussed the assessment and treatment plan with the patient. The patient was provided an opportunity to ask questions and all were answered. The patient agreed with the plan and demonstrated an understanding of the instructions.  Lab Orders  No laboratory test(s) ordered today    No orders of the defined types were placed in this encounter.    The patient was advised to call back or seek an in-person evaluation if the symptoms worsen or if the condition fails to improve as anticipated.  Ria Bush, MD

## 2018-07-19 ENCOUNTER — Encounter: Payer: Self-pay | Admitting: Family Medicine

## 2018-07-22 ENCOUNTER — Telehealth: Payer: Self-pay | Admitting: Internal Medicine

## 2018-07-22 NOTE — Telephone Encounter (Signed)
New Message ° ° ° °Pt is returning call  ° ° ° °Please call back  °

## 2018-07-22 NOTE — Telephone Encounter (Signed)
Pt agrees with 5/1 4pm appt with Dr Caryl Comes.

## 2018-07-23 ENCOUNTER — Telehealth (INDEPENDENT_AMBULATORY_CARE_PROVIDER_SITE_OTHER): Payer: Medicare Other | Admitting: Internal Medicine

## 2018-07-23 ENCOUNTER — Other Ambulatory Visit: Payer: Self-pay

## 2018-07-23 VITALS — BP 156/91 | HR 69 | Ht 69.0 in | Wt 147.0 lb

## 2018-07-23 DIAGNOSIS — I951 Orthostatic hypotension: Secondary | ICD-10-CM

## 2018-07-23 DIAGNOSIS — R5383 Other fatigue: Secondary | ICD-10-CM

## 2018-07-23 DIAGNOSIS — I428 Other cardiomyopathies: Secondary | ICD-10-CM

## 2018-07-23 MED ORDER — PYRIDOSTIGMINE BROMIDE 60 MG PO TABS: ORAL_TABLET | ORAL | 3 refills | Status: DC

## 2018-07-23 NOTE — Progress Notes (Signed)
Electrophysiology TeleHealth Note   Due to national recommendations of social distancing due to COVID 19, an audio/video telehealth visit is felt to be most appropriate for this patient at this time.  See MyChart message from today for the patient's consent to telehealth for [facility].   Date:  07/23/2018   ID:  Adam Clements, DOB 16-Sep-1934, MRN [id number]  Location: patient's home  Provider location: [address] [place]  Evaluation Performed: Follow-up visit  PCP:  Ria Bush, MD  Cardiologist:   *  Electrophysiologist:  SK   Chief Complaint:  Orthostatic intolernace   History of Present Illness:    Adam Clements is a 84 y.o. male who presents via audio/video conferencing for a telehealth visit today.  Since last being seen in our clinic for [place] thought attributable to autonomic failure*  the patient reports no interval syncope.    2/1/2 Northera Proamantine made things worse--less energy and less steadiness on his visit   Sore on R foot  Wearing compression socks 40-50 mm  and abdominal binder  R leg gets numb, sitting in a chair   We had discussed decreasing   The patient denies symptoms of fevers, chills, cough, or new SOB worrisome for COVID 19.    Past Medical History:  Diagnosis Date  . Allergic rhinitis   . Arthritis    Gout  . BPH (benign prostatic hypertrophy)   . Bradycardia   . CAD (coronary artery disease)    nonobstructive  . Chronic anemia 2013   h/o IDA, did not tolerate oral iron, latest normal iron studies, B12, folate  . Diverticulosis of colon    conoscopy 12/1993 (Dr. Amedeo Plenty) S/G divertics//colonoscopy L&R divertics (Dr. Amedeo Plenty) 11/08/2004  . Dizziness   . Gout   . HTN (hypertension)   . Memory loss   . Mitral regurgitation    Pulmonic regurgitation, with marked LA enlargement  . Nonischemic cardiomyopathy (HCC)    EF 40%  . Orthostatic hypotension   . PVC (premature ventricular  contraction)   . PVD (peripheral vascular disease) ([place])    followed by Dr Trula Slade, treating medically (pletal and aspirin)  . RBBB longstanding  . Squamous cell carcinoma in situ of skin 2015   L ear  . Syncope     Past Surgical History:  Procedure Laterality Date  . 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 APPENDECTOMY    . dexa  2004   "shrunk 2 inches", WNL    Current Outpatient Medications  Medication Sig Dispense Refill  . cholecalciferol (VITAMIN D) 1000 units tablet Take 1,000 Units by mouth daily.    Marland Kitchen dipyridamole-aspirin (AGGRENOX) 200-25 MG 12hr capsule Take 1 capsule by mouth 2 (two) times daily. 60 capsule 11  . Droxidopa (NORTHERA) 100 MG CAPS Take 2 capsules in the morning, 1 capsule at lunch, and 2 capsules in the late afternoon 450 capsule 3  . pravastatin (PRAVACHOL) 20 MG tablet Take 1 tablet (20 mg total) by mouth daily. 30 tablet 11  . vitamin B-12 (CYANOCOBALAMIN) 500 MCG tablet Take 500 mcg by mouth daily.     No current facility-administered medications for this visit.     Allergies:   Atorvastatin and Pletal [cilostazol]   Social History:  The patient  reports that he quit smoking about 35 years ago. His smoking use included cigarettes. He smoked 2.00 packs per day. He has quit using smokeless tobacco. He reports current alcohol use of  about 7.0 standard drinks of alcohol per week. He reports that he does not use drugs.   Family History:  The patient's   family history includes Heart attack in his brother; Heart disease in his brother; Heart disease (age of onset: 32) in his mother; Hyperlipidemia in his brother; Hypertension in his brother, father, mother, and another family member; Stroke in his father.   ROS:  Please see the history of present illness.   All other systems are personally reviewed and negative.    Exam:    Vital Signs:  BP (!) 156/91   Pulse 69   Ht 5\' 9"  (1.753 m)   Wt 147 lb (66.7 kg)   BMI 21.71 kg/m     Well appearing,  alert and conversant, regular work of breathing,  good skin color Eyes- anicteric, neuro- grossly intact, skin- no apparent rash or lesions or cyanosis, mouth- oral mucosa is pink   Labs/Other Tests and Data Reviewed:    Recent Labs: 04/02/2018: ALT 12; BUN 25; Creatinine, Ser 1.33; Hemoglobin 11.0; Platelets 239.0; Potassium 4.9; Sodium 130; TSH 2.52   Wt Readings from Last 3 Encounters:  07/23/18 147 lb (66.7 kg)  05/10/18 150 lb (68 kg)  04/02/18 149 lb 12 oz (67.9 kg)     Other studies personally reviewed:     ASSESSMENT & PLAN:    Hypertension  Orthostatic hypotension  Cardiomyopathy-nonischemic  Bilateral carotid stenosis  TIA  R leg numbness w sitting   Autonomic failure      Struggling with weakness and fatigue.  Syncope currently quiescient but lightheadedness frequent.  Not sure of the right leg numbness is related to vascular or neural compression.  We will have him take off his compression sock at night.  We will reattempt using Mestinon at 30 mg hopefully avoiding its GI side effects, dosed twice a day on arousal and then at 1400 hrs.  We will need to be back in touch in about 2 weeks  COVID 19 screen The patient denies symptoms of COVID 19 at this time.  The importance of social distancing was discussed today.  Follow-up:  As above     Current medicines are reviewed at length with the patient today.   The patient has concerns regarding his medicines.  The following changes were made today:  As above Mestinon 30 bid   Labs/ tests ordered today include:   No orders of the defined types were placed in this encounter.   Future tests ( post COVID )     Patient Risk:  after full review of this patients clinical status, I feel that they are at moderate risk at this time.  Today, I have spent 21 minutes with the patient with telehealth technology discussing the above.  Signed, Virl Axe, MD  07/23/2018 4:16 PM     [address] [place] [address] [phone] (office) [phone] (fax)

## 2018-07-23 NOTE — Telephone Encounter (Signed)
Spoke to the pt today via Telehealth visit

## 2018-08-06 ENCOUNTER — Telehealth (INDEPENDENT_AMBULATORY_CARE_PROVIDER_SITE_OTHER): Payer: Medicare Other | Admitting: Internal Medicine

## 2018-08-06 ENCOUNTER — Other Ambulatory Visit: Payer: Self-pay

## 2018-08-06 ENCOUNTER — Encounter: Payer: Self-pay | Admitting: Internal Medicine

## 2018-08-06 VITALS — BP 133/91 | HR 68 | Ht 69.0 in | Wt 147.0 lb

## 2018-08-06 DIAGNOSIS — I951 Orthostatic hypotension: Secondary | ICD-10-CM | POA: Diagnosis not present

## 2018-08-06 MED ORDER — PYRIDOSTIGMINE BROMIDE 60 MG PO TABS: ORAL_TABLET | ORAL | 3 refills | Status: DC

## 2018-08-06 NOTE — Patient Instructions (Signed)
Medication Instructions:  Increase your Pyridostigmine to 60mg  twice a day.Marland Kitchen a new RX was sent to [facility]: None ordered  Testing/Procedures: None ordered  Follow-Up: Your physician recommends that you schedule a follow-up appointment in: 2-3 weeks with Dr. Caryl Comes... will call you to make the appointment.    Any Other Special Instructions Will Be Listed Below (If Applicable).     If you need a refill on your cardiac medications before your next appointment, please call your pharmacy.

## 2018-08-06 NOTE — Progress Notes (Signed)
Electrophysiology TeleHealth Note   Due to national recommendations of social distancing due to COVID 19, an audio/video telehealth visit is felt to be most appropriate for this patient at this time.  See MyChart message from today for the patient's consent to telehealth for [facility].   Date:  08/06/2018   ID:  Adam Clements, DOB 30-Jan-1935, MRN [id number]  Location: patient's home  Provider location: [address] [place]  Evaluation Performed: Follow-up visit  PCP:  Ria Bush, MD  Cardiologist:   *  Electrophysiologist:  SK   Chief Complaint:  Orthostatic intolernace   History of Present Illness:    Adam Clements is a 84 y.o. male who presents via audio/video conferencing for a telehealth visit today.  Since last being seen in our clinic for [place] thought attributable to autonomic failure*  the patient reports no interval syncope.    2/1/2 Northera Proamantine made things worse--less energy and less steadiness on his visit    R leg numbness better w discontinuation of the support stockings  No untoward with the mestinon 30 bid, but also notes no improvement      Past Medical History:  Diagnosis Date  . Allergic rhinitis   . Arthritis    Gout  . BPH (benign prostatic hypertrophy)   . Bradycardia   . CAD (coronary artery disease)    nonobstructive  . Chronic anemia 2013   h/o IDA, did not tolerate oral iron, latest normal iron studies, B12, folate  . Diverticulosis of colon    conoscopy 12/1993 (Dr. Amedeo Plenty) S/G divertics//colonoscopy L&R divertics (Dr. Amedeo Plenty) 11/08/2004  . Dizziness   . Gout   . HTN (hypertension)   . Memory loss   . Mitral regurgitation    Pulmonic regurgitation, with marked LA enlargement  . Nonischemic cardiomyopathy (HCC)    EF 40%  . Orthostatic hypotension   . PVC (premature ventricular contraction)   . PVD (peripheral vascular disease) ([place])    followed by Dr Trula Slade, treating  medically (pletal and aspirin)  . RBBB longstanding  . Squamous cell carcinoma in situ of skin 2015   L ear  . Syncope     Past Surgical History:  Procedure Laterality Date  . 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 APPENDECTOMY    . dexa  2004   "shrunk 2 inches", WNL    Current Outpatient Medications  Medication Sig Dispense Refill  . cholecalciferol (VITAMIN D) 1000 units tablet Take 1,000 Units by mouth daily.    Marland Kitchen dipyridamole-aspirin (AGGRENOX) 200-25 MG 12hr capsule Take 1 capsule by mouth 2 (two) times daily. 60 capsule 11  . Droxidopa (NORTHERA) 100 MG CAPS Take 2 capsules in the morning, 1 capsule at lunch, and 2 capsules in the late afternoon 450 capsule 3  . pravastatin (PRAVACHOL) 20 MG tablet Take 1 tablet (20 mg total) by mouth daily. 30 tablet 11  . pyridostigmine (MESTINON) 60 MG tablet Take one half tablet when you wake up, then take one half tablet around 2 pm each day. 90 tablet 3  . vitamin B-12 (CYANOCOBALAMIN) 500 MCG tablet Take 500 mcg by mouth daily.     No current facility-administered medications for this visit.     Allergies:   Atorvastatin and Pletal [cilostazol]   Social History:  The patient  reports that he quit smoking about 35 years ago. His smoking use included cigarettes. He smoked 2.00 packs per day. He has quit using  smokeless tobacco. He reports current alcohol use of about 7.0 standard drinks of alcohol per week. He reports that he does not use drugs.   Family History:  The patient's   family history includes Heart attack in his brother; Heart disease in his brother; Heart disease (age of onset: 58) in his mother; Hyperlipidemia in his brother; Hypertension in his brother, father, mother, and another family member; Stroke in his father.   ROS:  Please see the history of present illness.   All other systems are personally reviewed and negative.    Exam:    Vital Signs:  BP (!) 133/91 (Patient Position: Standing)   Pulse 68   Ht 5'  9" (1.753 m)   Wt 147 lb (66.7 kg)   BMI 21.71 kg/m     Well appearing, alert and conversant, regular work of breathing,  good skin color Eyes- anicteric, neuro- grossly intact, skin- no apparent rash or lesions or cyanosis, mouth- oral mucosa is pink   Labs/Other Tests and Data Reviewed:    Recent Labs: 04/02/2018: ALT 12; BUN 25; Creatinine, Ser 1.33; Hemoglobin 11.0; Platelets 239.0; Potassium 4.9; Sodium 130; TSH 2.52   Wt Readings from Last 3 Encounters:  08/06/18 147 lb (66.7 kg)  07/23/18 147 lb (66.7 kg)  05/10/18 150 lb (68 kg)     Other studies personally reviewed: Additional studies/ records that were reviewed today include:  none    ASSESSMENT & PLAN:    Hypertension  Orthostatic hypotension  Cardiomyopathy-nonischemic  Bilateral carotid stenosis  TIA  R leg numbness w sitting   Autonomic failure      R leg numbness better with discontinuation of the compression stockings  Will try thigh sleeves for now  Tolerated the low dose of pyridostigmine, so will increase from 30>>60 bid      COVID 19 screen The patient denies symptoms of COVID 19 at this time.  The importance of social distancing was discussed today.  Follow-up:  2-3 w    Current medicines are reviewed at length with the patient today.   The patient does not have concerns regarding his medicines.  The following changes were made today:    Increase pyridostigmine 30>>60 BID  Labs/ tests ordered today include:   No orders of the defined types were placed in this encounter.   Future tests ( post COVID )    Patient Risk:  after full review of this patients clinical status, I feel that they are at moderate  risk at this time.  Today, I have spent 19 minutes with the patient with telehealth technology discussing the above.  Signed, Virl Axe, MD  08/06/2018 4:14 PM     [place] [address] [phone] (office)  [phone] (fax)

## 2018-08-06 NOTE — Addendum Note (Signed)
Addended by: Stephani Police on: 08/06/2018 04:50 PM   Modules accepted: Orders

## 2018-08-25 ENCOUNTER — Ambulatory Visit: Payer: Medicare Other | Admitting: Internal Medicine

## 2018-08-26 ENCOUNTER — Other Ambulatory Visit: Payer: Self-pay

## 2018-08-26 ENCOUNTER — Telehealth (INDEPENDENT_AMBULATORY_CARE_PROVIDER_SITE_OTHER): Payer: Medicare Other | Admitting: Internal Medicine

## 2018-08-26 DIAGNOSIS — I428 Other cardiomyopathies: Secondary | ICD-10-CM

## 2018-08-26 DIAGNOSIS — I951 Orthostatic hypotension: Secondary | ICD-10-CM | POA: Diagnosis not present

## 2018-08-26 NOTE — Progress Notes (Signed)
Electrophysiology TeleHealth Note   Due to national recommendations of social distancing due to COVID 19, an audio/video telehealth visit is felt to be most appropriate for this patient at this time.  See MyChart message from today for the patient's consent to telehealth for [facility].   Date:  08/26/2018   ID:  Adam Clements, DOB 1934-07-09, MRN [id number]  Location: patient's home  Provider location: [address], [place]  Evaluation Performed: Follow-up visit  PCP:  Ria Bush, MD  Cardiologist:   Electrophysiologist:  SK   Chief Complaint:  *weakness  History of Present Illness:    Adam Clements is a 84 y.o. male who presents via audio/video conferencing for a telehealth visit today.  Since last being seen in our clinic for orthostatic intolerance/syncope thought 2/2 autonomic failure  the patient reports blood pressures much improved without significant orthostatic drop, 160s->130 or so (distinct from 190s-->90 or so)    Not withstanding, his weakness continues to worsen.  Not so much strength as endurance and needing to rest.  Tried thigh sleeves and increasing pyridostigmine; he did not try the former, he discontinued the latter because of diarrhea.    The patient denies symptoms of fevers, chills, cough, or new SOB worrisome for COVID 19.    Past Medical History:  Diagnosis Date  . Allergic rhinitis   . Arthritis    Gout  . BPH (benign prostatic hypertrophy)   . Bradycardia   . CAD (coronary artery disease)    nonobstructive  . Chronic anemia 2013   h/o IDA, did not tolerate oral iron, latest normal iron studies, B12, folate  . Diverticulosis of colon    conoscopy 12/1993 (Dr. Amedeo Plenty) S/G divertics//colonoscopy L&R divertics (Dr. Amedeo Plenty) 11/08/2004  . Dizziness   . Gout   . HTN (hypertension)   . Memory loss   . Mitral regurgitation    Pulmonic regurgitation, with marked LA enlargement  . Nonischemic cardiomyopathy (HCC)    EF  40%  . Orthostatic hypotension   . PVC (premature ventricular contraction)   . PVD (peripheral vascular disease) ([place])    followed by Dr Trula Slade, treating medically (pletal and aspirin)  . RBBB longstanding  . Squamous cell carcinoma in situ of skin 2015   L ear  . Syncope     Past Surgical History:  Procedure Laterality Date  . 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 APPENDECTOMY    . dexa  2004   "shrunk 2 inches", WNL    Current Outpatient Medications  Medication Sig Dispense Refill  . cholecalciferol (VITAMIN D) 1000 units tablet Take 1,000 Units by mouth daily.    Marland Kitchen dipyridamole-aspirin (AGGRENOX) 200-25 MG 12hr capsule Take 1 capsule by mouth 2 (two) times daily. 60 capsule 11  . Droxidopa (NORTHERA) 100 MG CAPS Take 2 capsules in the morning, 1 capsule at lunch, and 2 capsules in the late afternoon 450 capsule 3  . pravastatin (PRAVACHOL) 20 MG tablet Take 1 tablet (20 mg total) by mouth daily. 30 tablet 11  . vitamin B-12 (CYANOCOBALAMIN) 500 MCG tablet Take 500 mcg by mouth daily.    Marland Kitchen pyridostigmine (MESTINON) 60 MG tablet Take one tablet (60mg ) by mouth when you wake up and another tablet (60mg ) at 2pm. (Patient not taking: Reported on 08/26/2018) 180 tablet 3   No current facility-administered medications for this visit.     Allergies:   Atorvastatin and Pletal [cilostazol]   Social History:  The patient  reports that he quit smoking about 35 years ago. His smoking use included cigarettes. He smoked 2.00 packs per day. He has quit using smokeless tobacco. He reports current alcohol use of about 7.0 standard drinks of alcohol per week. He reports that he does not use drugs.   Family History:  The patient's   family history includes Heart attack in his brother; Heart disease in his brother; Heart disease (age of onset: 34) in his mother; Hyperlipidemia in his brother; Hypertension in his brother, father, mother, and another family member; Stroke in his father.   ROS:   Please see the history of present illness.   All other systems are personally reviewed and negative.    Exam:    Vital Signs:  There were no vitals taken for this visit.   132/68   Well appearing, alert and conversant, regular work of breathing,  good skin color Eyes- anicteric, neuro- grossly intact, skin- no apparent rash or lesions or cyanosis, mouth- oral mucosa is pink   Labs/Other Tests and Data Reviewed:    Recent Labs: 04/02/2018: ALT 12; BUN 25; Creatinine, Ser 1.33; Hemoglobin 11.0; Platelets 239.0; Potassium 4.9; Sodium 130; TSH 2.52   Wt Readings from Last 3 Encounters:  08/06/18 147 lb (66.7 kg)  07/23/18 147 lb (66.7 kg)  05/10/18 150 lb (68 kg)     Other studies personally reviewed:      ASSESSMENT & PLAN:    Autonomic failure  Hypertension  Orthostatic hypotension  Cardiomyopathy-nonischemic  Bilateral carotid stenosis  TIA  Weakness  Blood pressure seems to be better; I suspect that was happening in the barn will be exertional hypotension and so we will try and measure her heart rate and blood pressure in the morning when he gets weak--  If this is not the explanation, we will need to look at other causes.  He will relate his information next week by way of MyChart      COVID 19 screen The patient denies symptoms of COVID 19 at this time.  The importance of social distancing was discussed today.  Follow-up:  4 weeks     Current medicines are reviewed at length with the patient today.   The patient does not have concerns regarding his medicines.  The following changes were made today:  none  Labs/ tests ordered today include:   No orders of the defined types were placed in this encounter.   Future tests ( post COVID )     Patient Risk:  after full review of this patients clinical status, I feel that they are at moderate  risk at this time.  Today, I have spent 10 minutes with the patient with telehealth technology  discussing the above.  Signed, Virl Axe, MD  08/26/2018 3:52 PM     [place] [address] [phone] (office) [phone] (fax)

## 2018-09-02 DIAGNOSIS — D485 Neoplasm of uncertain behavior of skin: Secondary | ICD-10-CM | POA: Diagnosis not present

## 2018-09-02 DIAGNOSIS — D2222 Melanocytic nevi of left ear and external auricular canal: Secondary | ICD-10-CM | POA: Diagnosis not present

## 2018-09-02 DIAGNOSIS — D1801 Hemangioma of skin and subcutaneous tissue: Secondary | ICD-10-CM | POA: Diagnosis not present

## 2018-09-02 DIAGNOSIS — Z85828 Personal history of other malignant neoplasm of skin: Secondary | ICD-10-CM | POA: Diagnosis not present

## 2018-09-02 DIAGNOSIS — L570 Actinic keratosis: Secondary | ICD-10-CM | POA: Diagnosis not present

## 2018-09-02 DIAGNOSIS — L821 Other seborrheic keratosis: Secondary | ICD-10-CM | POA: Diagnosis not present

## 2018-09-02 DIAGNOSIS — D225 Melanocytic nevi of trunk: Secondary | ICD-10-CM | POA: Diagnosis not present

## 2018-09-02 DIAGNOSIS — L84 Corns and callosities: Secondary | ICD-10-CM | POA: Diagnosis not present

## 2018-09-02 DIAGNOSIS — L814 Other melanin hyperpigmentation: Secondary | ICD-10-CM | POA: Diagnosis not present

## 2018-09-07 ENCOUNTER — Other Ambulatory Visit: Payer: Self-pay | Admitting: Family Medicine

## 2018-09-10 ENCOUNTER — Encounter: Payer: Self-pay | Admitting: Family Medicine

## 2018-09-10 ENCOUNTER — Ambulatory Visit: Payer: Medicare Other | Admitting: Internal Medicine

## 2018-09-19 ENCOUNTER — Other Ambulatory Visit: Payer: Self-pay | Admitting: Family Medicine

## 2018-09-19 DIAGNOSIS — D649 Anemia, unspecified: Secondary | ICD-10-CM

## 2018-09-19 DIAGNOSIS — I158 Other secondary hypertension: Secondary | ICD-10-CM

## 2018-09-19 DIAGNOSIS — I5022 Chronic systolic (congestive) heart failure: Secondary | ICD-10-CM

## 2018-09-19 DIAGNOSIS — T50905A Adverse effect of unspecified drugs, medicaments and biological substances, initial encounter: Secondary | ICD-10-CM

## 2018-09-19 DIAGNOSIS — E639 Nutritional deficiency, unspecified: Secondary | ICD-10-CM

## 2018-09-19 DIAGNOSIS — R638 Other symptoms and signs concerning food and fluid intake: Secondary | ICD-10-CM

## 2018-09-20 ENCOUNTER — Other Ambulatory Visit (INDEPENDENT_AMBULATORY_CARE_PROVIDER_SITE_OTHER): Payer: Medicare Other

## 2018-09-20 ENCOUNTER — Other Ambulatory Visit: Payer: Self-pay

## 2018-09-20 DIAGNOSIS — I5022 Chronic systolic (congestive) heart failure: Secondary | ICD-10-CM | POA: Diagnosis not present

## 2018-09-20 DIAGNOSIS — E639 Nutritional deficiency, unspecified: Secondary | ICD-10-CM

## 2018-09-20 DIAGNOSIS — R638 Other symptoms and signs concerning food and fluid intake: Secondary | ICD-10-CM

## 2018-09-20 DIAGNOSIS — T50905A Adverse effect of unspecified drugs, medicaments and biological substances, initial encounter: Secondary | ICD-10-CM

## 2018-09-20 DIAGNOSIS — D649 Anemia, unspecified: Secondary | ICD-10-CM

## 2018-09-20 DIAGNOSIS — I158 Other secondary hypertension: Secondary | ICD-10-CM | POA: Diagnosis not present

## 2018-09-20 LAB — COMPREHENSIVE METABOLIC PANEL
ALT: 9 U/L (ref 0–53)
AST: 11 U/L (ref 0–37)
Albumin: 3.5 g/dL (ref 3.5–5.2)
Alkaline Phosphatase: 44 U/L (ref 39–117)
BUN: 25 mg/dL — ABNORMAL HIGH (ref 6–23)
CO2: 26 mEq/L (ref 19–32)
Calcium: 8.6 mg/dL (ref 8.4–10.5)
Chloride: 104 mEq/L (ref 96–112)
Creatinine, Ser: 1.28 mg/dL (ref 0.40–1.50)
GFR: 53.75 mL/min — ABNORMAL LOW (ref >60.00)
Glucose, Bld: 86 mg/dL (ref 70–99)
Potassium: 5.2 mEq/L — ABNORMAL HIGH (ref 3.5–5.1)
Sodium: 135 mEq/L (ref 135–145)
Total Bilirubin: 0.8 mg/dL (ref 0.2–1.2)
Total Protein: 5.7 g/dL — ABNORMAL LOW (ref 6.0–8.3)

## 2018-09-20 LAB — LIPID PANEL
Cholesterol: 123 mg/dL (ref 0–200)
HDL: 50.2 mg/dL (ref >39.00)
LDL Cholesterol: 66 mg/dL (ref 0–99)
NonHDL: 72.84
Total CHOL/HDL Ratio: 2
Triglycerides: 36 mg/dL (ref 0.0–149.0)
VLDL: 7.2 mg/dL (ref 0.0–40.0)

## 2018-09-20 LAB — CBC WITH DIFFERENTIAL/PLATELET
Basophils Absolute: 0 10*3/uL (ref 0.0–0.1)
Basophils Relative: 0.7 % (ref 0.0–3.0)
Eosinophils Absolute: 0.2 10*3/uL (ref 0.0–0.7)
Eosinophils Relative: 4.4 % (ref 0.0–5.0)
HCT: 31.6 % — ABNORMAL LOW (ref 39.0–52.0)
Hemoglobin: 10.8 g/dL — ABNORMAL LOW (ref 13.0–17.0)
Lymphocytes Relative: 15.2 % (ref 12.0–46.0)
Lymphs Abs: 0.8 10*3/uL (ref 0.7–4.0)
MCHC: 34.3 g/dL (ref 30.0–36.0)
MCV: 96.1 fl (ref 78.0–100.0)
Monocytes Absolute: 0.6 10*3/uL (ref 0.1–1.0)
Monocytes Relative: 11.5 % (ref 3.0–12.0)
Neutro Abs: 3.6 10*3/uL (ref 1.4–7.7)
Neutrophils Relative %: 68.2 % (ref 43.0–77.0)
Platelets: 197 10*3/uL (ref 150.0–400.0)
RBC: 3.29 Mil/uL — ABNORMAL LOW (ref 4.22–5.81)
RDW: 13 % (ref 11.5–15.5)
WBC: 5.2 10*3/uL (ref 4.0–10.5)

## 2018-09-21 LAB — MICROALBUMIN / CREATININE URINE RATIO
Creatinine,U: 167.5 mg/dL
Microalb Creat Ratio: 0.5 mg/g (ref 0.0–30.0)
Microalb, Ur: 0.9 mg/dL (ref 0.0–1.9)

## 2018-09-22 ENCOUNTER — Encounter: Payer: Self-pay | Admitting: Family Medicine

## 2018-09-22 ENCOUNTER — Other Ambulatory Visit: Payer: Self-pay

## 2018-09-22 ENCOUNTER — Ambulatory Visit: Payer: Self-pay

## 2018-09-22 ENCOUNTER — Ambulatory Visit (INDEPENDENT_AMBULATORY_CARE_PROVIDER_SITE_OTHER): Payer: Medicare Other | Admitting: Family Medicine

## 2018-09-22 VITALS — BP 140/78 | HR 64 | Temp 98.7°F | Ht 67.25 in | Wt 150.1 lb

## 2018-09-22 DIAGNOSIS — R413 Other amnesia: Secondary | ICD-10-CM | POA: Diagnosis not present

## 2018-09-22 DIAGNOSIS — E46 Unspecified protein-calorie malnutrition: Secondary | ICD-10-CM

## 2018-09-22 DIAGNOSIS — Z0000 Encounter for general adult medical examination without abnormal findings: Secondary | ICD-10-CM | POA: Diagnosis not present

## 2018-09-22 DIAGNOSIS — G903 Multi-system degeneration of the autonomic nervous system: Secondary | ICD-10-CM

## 2018-09-22 DIAGNOSIS — D649 Anemia, unspecified: Secondary | ICD-10-CM | POA: Diagnosis not present

## 2018-09-22 DIAGNOSIS — I739 Peripheral vascular disease, unspecified: Secondary | ICD-10-CM | POA: Diagnosis not present

## 2018-09-22 DIAGNOSIS — R5383 Other fatigue: Secondary | ICD-10-CM | POA: Diagnosis not present

## 2018-09-22 DIAGNOSIS — I5022 Chronic systolic (congestive) heart failure: Secondary | ICD-10-CM

## 2018-09-22 DIAGNOSIS — G3184 Mild cognitive impairment, so stated: Secondary | ICD-10-CM | POA: Insufficient documentation

## 2018-09-22 MED ORDER — ASPIRIN-DIPYRIDAMOLE ER 25-200 MG PO CP12: 1.0000 | ORAL_CAPSULE | Freq: Two times a day (BID) | ORAL | 11 refills | Status: DC

## 2018-09-22 NOTE — Assessment & Plan Note (Signed)
Appreciate cards care. Has seen [facility] as well. Autonomic failure, likely neurogenic orthostatic hypotension. Doing well on regular northera.

## 2018-09-22 NOTE — Assessment & Plan Note (Signed)
Ongoing concern. Low protein levels reviewed with patient today

## 2018-09-22 NOTE — Progress Notes (Signed)
This visit was conducted in person.  BP 140/78 (BP Location: Right Arm, Patient Position: Sitting, Cuff Size: Normal)   Pulse 64   Temp 98.7 F (37.1 C) (Tympanic)   Ht 5' 7.25" (1.708 m)   Wt 150 lb 2 oz (68.1 kg)   SpO2 97%   BMI 23.34 kg/m    CC: AMW Subjective:    Patient ID: Adam Clements, male    DOB: 1934-07-27, 84 y.o.   MRN: [id number]  HPI: Adam Clements is a 84 y.o. male presenting on 09/22/2018 for Medicare Wellness   Last few months have been "better than usual." Feels BP stable on Northera.  Did not see Katha Cabal this year.   Hearing Screening   125Hz  250Hz  500Hz  1000Hz  2000Hz  3000Hz  4000Hz  6000Hz  8000Hz   Right ear:           Left ear:           Comments: Wearing bilateral hearing aids   Visual Acuity Screening   Right eye Left eye Both eyes  Without correction:     With correction: 20/30 20/25 20/25   Passes depression and fall screen.  Misses the gym, continues [place] stay [place].  Notes more trouble with memory recently  ie forgetful with names he always knew, notes more difficulty with finances.   Preventative: Colonoscopy - 2006 by Dr. Amedeo Plenty - R and L sided diverticulosis. Not interested in repeat at this time. Decided to age out. Saw GI 2015 2/2 weight loss and IDA - decided to monitor with stool kits, has not done one recently. Slow release iron caused stomach upset. Denies blood in stool.  Prostate screening - normal PSA in the past. H/o BPH. Age out.  DEXA 2004 WNL.  Flu shot - yearly  Pneumovax 2004, prevnar 2015 Td 2012  zostavax 2009 shingrix - 11/2016, 09/2017 Advanced directives: has living will at home -done through attorney. Wants HCPOA to be wife then son and then oldest daughter etc. Advanced directives in chart (10/2012). Does not want artificial nutrition/hydration or prolonged life support Seat belt use discussed.  Sunscreen use discussed. No changing moles on skin. Sees derm regularly (Dr Derrel Nip). Non smoker Alcohol  - stopped alcohol - quit this year Dentist - doesn't see - has full dentures Eye exam - yearly  Bowel - no constipation  Bladder - no incontinence   Lives with wife [name]).  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 Interim medical history since our last visit reviewed. Allergies and medications reviewed and updated. Outpatient Medications Prior to Visit  Medication Sig Dispense Refill  . cholecalciferol (VITAMIN D) 1000 units tablet Take 1,000 Units by mouth daily.    . Droxidopa (NORTHERA) 100 MG CAPS Take 2 capsules in the morning, 1 capsule at lunch, and 2 capsules in the late afternoon 450 capsule 3  . pravastatin (PRAVACHOL) 20 MG tablet TAKE 1 TABLET(20 MG) BY MOUTH DAILY 30 tablet 0  . vitamin B-12 (CYANOCOBALAMIN) 500 MCG tablet Take 500 mcg by mouth daily.    Marland Kitchen dipyridamole-aspirin (AGGRENOX) 200-25 MG 12hr capsule Take 1 capsule by mouth 2 (two) times daily. 60 capsule 11  . pyridostigmine (MESTINON) 60 MG tablet Take one tablet (60mg ) by mouth when you wake up and  another tablet (60mg ) at 2pm. (Patient not taking: Reported on 08/26/2018) 180 tablet 3   No facility-administered medications prior to visit.      Per HPI unless specifically indicated in ROS section below Review of Systems Objective:    BP 140/78 (BP Location: Right Arm, Patient Position: Sitting, Cuff Size: Normal)   Pulse 64   Temp 98.7 F (37.1 C) (Tympanic)   Ht 5' 7.25" (1.708 m)   Wt 150 lb 2 oz (68.1 kg)   SpO2 97%   BMI 23.34 kg/m   Wt Readings from Last 3 Encounters:  09/22/18 150 lb 2 oz (68.1 kg)  08/06/18 147 lb (66.7 kg)  07/23/18 147 lb (66.7 kg)    Physical Exam Vitals signs and nursing note reviewed.  Constitutional:       General: He is not in acute distress.    Appearance: Normal appearance. He is well-developed.  HENT:     Head: Normocephalic and atraumatic.     Right Ear: Decreased hearing noted.     Left Ear: Decreased hearing noted.     Ears:     Comments: Hearing aides in place    Nose: Nose normal.     Mouth/Throat:     Mouth: Mucous membranes are moist.     Pharynx: Uvula midline. No oropharyngeal exudate or posterior oropharyngeal erythema.  Eyes:     General: No scleral icterus.    Conjunctiva/sclera: Conjunctivae normal.     Pupils: Pupils are equal, round, and reactive to light.  Neck:     Musculoskeletal: Normal range of motion and neck supple.  Cardiovascular:     Rate and Rhythm: Normal rate and regular rhythm.     Pulses: Normal pulses.          Radial pulses are 2+ on the right side and 2+ on the left side.     Heart sounds: Normal heart sounds. No murmur.  Pulmonary:     Effort: Pulmonary effort is normal. No respiratory distress.     Breath sounds: Normal breath sounds. No wheezing, rhonchi or rales.  Abdominal:     General: Abdomen is flat. Bowel sounds are normal. There is no distension.     Palpations: Abdomen is soft. There is no mass.     Tenderness: There is no abdominal tenderness. There is no guarding or rebound.     Hernia: No hernia is present.     Comments: Abdominal binder in place  Musculoskeletal: Normal range of motion.  Lymphadenopathy:     Cervical: No cervical adenopathy.  Skin:    General: Skin is warm and dry.     Findings: No rash.  Neurological:     General: No focal deficit present.     Mental Status: He is alert and oriented to person, place, and time.     Comments:  CN grossly intact, station and gait intact Recall 1/3, 2/3 with recall Calculation 5/5 serial 3s  Psychiatric:        Mood and Affect: Mood normal.        Behavior: Behavior normal.        Thought Content: Thought content normal.        Judgment: Judgment normal.        Results for orders placed or performed in visit on 09/20/18  Microalbumin / creatinine urine ratio  Result Value Ref Range   Microalb, Ur 0.9 0.0 - 1.9 mg/dL   Creatinine,U 167.5 mg/dL   Microalb Creat Ratio 0.5 0.0 -  30.0 mg/g  CBC with Differential/Platelet  Result Value Ref Range   WBC 5.2 4.0 - 10.5 K/uL   RBC 3.29 (L) 4.22 - 5.81 Mil/uL   Hemoglobin 10.8 (L) 13.0 - 17.0 g/dL   HCT 31.6 (L) 39.0 - 52.0 %   MCV 96.1 78.0 - 100.0 fl   MCHC 34.3 30.0 - 36.0 g/dL   RDW 13.0 11.5 - 15.5 %   Platelets 197.0 150.0 - 400.0 K/uL   Neutrophils Relative % 68.2 43.0 - 77.0 %   Lymphocytes Relative 15.2 12.0 - 46.0 %   Monocytes Relative 11.5 3.0 - 12.0 %   Eosinophils Relative 4.4 0.0 - 5.0 %   Basophils Relative 0.7 0.0 - 3.0 %   Neutro Abs 3.6 1.4 - 7.7 K/uL   Lymphs Abs 0.8 0.7 - 4.0 K/uL   Monocytes Absolute 0.6 0.1 - 1.0 K/uL   Eosinophils Absolute 0.2 0.0 - 0.7 K/uL   Basophils Absolute 0.0 0.0 - 0.1 K/uL  Lipid panel  Result Value Ref Range   Cholesterol 123 0 - 200 mg/dL   Triglycerides 36.0 0.0 - 149.0 mg/dL   HDL 50.20 >39.00 mg/dL   VLDL 7.2 0.0 - 40.0 mg/dL   LDL Cholesterol 66 0 - 99 mg/dL   Total CHOL/HDL Ratio 2    NonHDL 72.84   Comprehensive metabolic panel  Result Value Ref Range   Sodium 135 135 - 145 mEq/L   Potassium 5.2 (H) 3.5 - 5.1 mEq/L   Chloride 104 96 - 112 mEq/L   CO2 26 19 - 32 mEq/L   Glucose, Bld 86 70 - 99 mg/dL   BUN 25 (H) 6 - 23 mg/dL   Creatinine, Ser 1.28 0.40 - 1.50 mg/dL   Total Bilirubin 0.8 0.2 - 1.2 mg/dL   Alkaline Phosphatase 44 39 - 117 U/L   AST 11 0 - 37 U/L   ALT 9 0 - 53 U/L   Total Protein 5.7 (L) 6.0 - 8.3 g/dL   Albumin 3.5 3.5 - 5.2 g/dL   Calcium 8.6 8.4 - 10.5 mg/dL   GFR 53.75 (L) >60.00 mL/min   Assessment & Plan:   Problem List Items Addressed This Visit    Protein malnutrition ([place])    Reviewed importance of protein source with each meal, discussed adding protein supplement to meals once a day (boost or  ensure) - not as meal replacement. This could contribute to decreased stamina.       PAD (peripheral artery disease) (HCC)    pletal likely contributed to orthostasis.       Normocytic anemia    Ongoing. Provided with handout on iron rich foods. Check anemia panel next labs. Ferritin previously normal in 170s.       Neurogenic orthostatic hypotension ([place])    Appreciate cards care. Has seen [facility] as well. Autonomic failure, likely neurogenic orthostatic hypotension. Doing well on regular northera.       Memory loss    Noted today with struggle with recall. He endorses noticing more difficulty with finances and familiar names. I did ask him to return in 6 months for formal geriatric assessment.  Chronic orthostasis could contribute although recently doing better from this standpoint.  Social isolation due to Covid likely not helping either.       Medicare annual wellness visit, subsequent - Primary    I have personally reviewed the Medicare Annual Wellness questionnaire and have noted 1. The patient's medical and social history 2. Their use of alcohol,  tobacco or illicit drugs 3. Their current medications and supplements 4. The patient's functional ability including ADL's, fall risks, home safety risks and hearing or visual impairment. Cognitive function has been assessed and addressed as indicated.  5. Diet and physical activity 6. Evidence for depression or mood disorders The patients weight, height, BMI have been recorded in the chart. I have made referrals, counseling and provided education to the patient based on review of the above and I have provided the pt with a written personalized care plan for preventive services. Provider list updated.. See scanned questionairre as needed for further documentation. Reviewed preventative protocols and updated unless pt declined.       HFrEF (heart failure with reduced ejection fraction) ([place])    Appreciate cards care. Seems euvolemic.        Decreased stamina    Ongoing concern. Low protein levels reviewed with patient today          Meds ordered this encounter  Medications  . dipyridamole-aspirin (AGGRENOX) 200-25 MG 12hr capsule    Sig: Take 1 capsule by mouth 2 (two) times daily.    Dispense:  60 capsule    Refill:  11   No orders of the defined types were placed in this encounter.   Follow up plan: Return in about 6 months (around 03/25/2019) for follow up visit.  Ria Bush, MD

## 2018-09-22 NOTE — Assessment & Plan Note (Signed)
Appreciate cards care. Seems euvolemic.

## 2018-09-22 NOTE — Assessment & Plan Note (Signed)
Ongoing. Provided with handout on iron rich foods. Check anemia panel next labs. Ferritin previously normal in 170s.

## 2018-09-22 NOTE — Patient Instructions (Addendum)
For persistent anemia - increase iron rich foods, handout provided today.  Increase water in the diet as your potassium was a bit elevated, increase protein in the diet as your protein was a bit low, consider protein shake like boost or ensure to supplement a meal.  Keep an eye on the memory - let me know if worsening.  Return in 6 months for follow up visit.   Health Maintenance After Age 82 After age 67, you are at a higher risk for certain long-term diseases and infections as well as injuries from falls. Falls are a major cause of broken bones and head injuries in people who are older than age 22. Getting regular preventive care can help to keep you healthy and well. Preventive care includes getting regular testing and making lifestyle changes as recommended by your health care provider. Talk with your health care provider about:  Which screenings and tests you should have. A screening is a test that checks for a disease when you have no symptoms.  A diet and exercise plan that is right for you. What should I know about screenings and tests to prevent falls? Screening and testing are the best ways to find a health problem early. Early diagnosis and treatment give you the best chance of managing medical conditions that are common after age 109. Certain conditions and lifestyle choices may make you more likely to have a fall. Your health care provider may recommend:  Regular vision checks. Poor vision and conditions such as cataracts can make you more likely to have a fall. If you wear glasses, make sure to get your prescription updated if your vision changes.  Medicine review. Work with your health care provider to regularly review all of the medicines you are taking, including over-the-counter medicines. Ask your health care provider about any side effects that may make you more likely to have a fall. Tell your health care provider if any medicines that you take make you feel dizzy or  sleepy.  Osteoporosis screening. Osteoporosis is a condition that causes the bones to get weaker. This can make the bones weak and cause them to break more easily.  Blood pressure screening. Blood pressure changes and medicines to control blood pressure can make you feel dizzy.  Strength and balance checks. Your health care provider may recommend certain tests to check your strength and balance while standing, walking, or changing positions.  Foot health exam. Foot pain and numbness, as well as not wearing proper footwear, can make you more likely to have a fall.  Depression screening. You may be more likely to have a fall if you have a fear of falling, feel emotionally low, or feel unable to do activities that you used to do.  Alcohol use screening. Using too much alcohol can affect your balance and may make you more likely to have a fall. What actions can I take to lower my risk of falls? General instructions  Talk with your health care provider about your risks for falling. Tell your health care provider if: ? You fall. Be sure to tell your health care provider about all falls, even ones that seem minor. ? You feel dizzy, sleepy, or off-balance.  Take over-the-counter and prescription medicines only as told by your health care provider. These include any supplements.  Eat a healthy diet and maintain a healthy weight. A healthy diet includes low-fat dairy products, low-fat (lean) meats, and fiber from whole grains, beans, and lots of fruits and vegetables. Home  safety  Remove any tripping hazards, such as rugs, cords, and clutter.  Install safety equipment such as grab bars in bathrooms and safety rails on stairs.  Keep rooms and walkways well-lit. Activity   Follow a regular exercise program to stay fit. This will help you maintain your balance. Ask your health care provider what types of exercise are appropriate for you.  If you need a cane or walker, use it as recommended by  your health care provider.  Wear supportive shoes that have nonskid soles. Lifestyle  Do not drink alcohol if your health care provider tells you not to drink.  If you drink alcohol, limit how much you have: ? 0-1 drink a day for women. ? 0-2 drinks a day for men.  Be aware of how much alcohol is in your drink. In the [place] one drink equals one typical bottle of beer (12 oz), one-half glass of wine (5 oz), or one shot of hard liquor (1 oz).  Do not use any products that contain nicotine or tobacco, such as cigarettes and e-cigarettes. If you need help quitting, ask your health care provider. Summary  Having a healthy lifestyle and getting preventive care can help to protect your health and wellness after age 65.  Screening and testing are the best way to find a health problem early and help you avoid having a fall. Early diagnosis and treatment give you the best chance for managing medical conditions that are more common for people who are older than age 16.  Falls are a major cause of broken bones and head injuries in people who are older than age 43. Take precautions to prevent a fall at home.  Work with your health care provider to learn what changes you can make to improve your health and wellness and to prevent falls. This information is not intended to replace advice given to you by your health care provider. Make sure you discuss any questions you have with your health care provider. Document Released: 01/21/2017 Document Revised: 07/01/2018 Document Reviewed: 01/21/2017 Elsevier Patient Education  2020 [employer].

## 2018-09-22 NOTE — Assessment & Plan Note (Signed)

## 2018-09-22 NOTE — Assessment & Plan Note (Signed)
pletal likely contributed to orthostasis.

## 2018-09-22 NOTE — Assessment & Plan Note (Addendum)
Noted today with struggle with recall. He endorses noticing more difficulty with finances and familiar names. I did ask him to return in 6 months for formal geriatric assessment.  Chronic orthostasis could contribute although recently doing better from this standpoint.  Social isolation due to Covid likely not helping either.

## 2018-09-22 NOTE — Assessment & Plan Note (Addendum)
Reviewed importance of protein source with each meal, discussed adding protein supplement to meals once a day (boost or ensure) - not as meal replacement. This could contribute to decreased stamina.

## 2018-09-25 ENCOUNTER — Other Ambulatory Visit: Payer: Self-pay | Admitting: Family Medicine

## 2018-09-29 ENCOUNTER — Other Ambulatory Visit: Payer: Self-pay

## 2018-09-29 ENCOUNTER — Ambulatory Visit (INDEPENDENT_AMBULATORY_CARE_PROVIDER_SITE_OTHER): Payer: Medicare Other | Admitting: Internal Medicine

## 2018-09-29 ENCOUNTER — Encounter: Payer: Self-pay | Admitting: Internal Medicine

## 2018-09-29 VITALS — BP 112/64 | HR 67 | Ht 67.25 in | Wt 147.0 lb

## 2018-09-29 DIAGNOSIS — R5383 Other fatigue: Secondary | ICD-10-CM | POA: Diagnosis not present

## 2018-09-29 DIAGNOSIS — I951 Orthostatic hypotension: Secondary | ICD-10-CM | POA: Diagnosis not present

## 2018-09-29 NOTE — Progress Notes (Signed)
ELECTROPHYSIOLOGY OFFICE NOTE  Patient ID: ZIA KANNER, MRN: [id number], DOB/AGE: [birth year] 84 y.o. Admit date: (Not on file) Date of Consult: 09/29/2018  Primary Physician: Ria Bush, MD Primary Cardiologist: Marney Setting is a 84 y.o. male who is being seen today for the evaluation of orthostatic hypotension at the request of himself and wife.    HPI BRYTEN MAHER is a 84 y.o. male seen today because of profound orthostasis with lightheadedness and fatigue.  It is somewhat delayed occurring a few minutes after standing.  He has had syncope.    We have looked for evidence of amyloid but have found little>> borderline PYP ; myeloma panel>>M prot 0.3  FLC assay neg    IFE urine neg  Discussed with Dr Gaylan Gerold  He does have a history of hypertension. He does not have problems with bowel function.  He is having progressive problems with dry mouth.  No problems with bowel function and stable issues with bladder function.    He continues to struggle.  He was intolerant of Mestinon.  He found no benefit from a compressive wear.  He has been tried on [employer].  He is now on Northera.  He was intolerant of the doses of 300 mg because of confusion and possible associated hypertension.  It is his impression that he is better all on more care than off.    He underwent testing at [facility] under the care of Dr. Fenton Malling; results were consistent with autonomic failure--probably a neurogenic orthostatic hypotension definitely.   He is not aware of why he is on dual antiplatelet therapy;>> review of his chart identifies a prior TIA with expressive aphasia which he subsequently acknowledges.  He is doing exceptionally better.  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  He has not passed out now in more than 6 months.  He has some lightheadedness.  No significant constipation; diarrhea from Mestinon is resolved.   DATE TEST EF   6/17 MYOVIEW  36 %    4/17 Echo   45-50% LAE severe  10/19 HUT      Date Cr Hgb  7/19 1.16 11.8  10/19 1.5 10.4      Past Medical History:  Diagnosis Date  . Allergic rhinitis   . Arthritis    Gout  . BPH (benign prostatic hypertrophy)   . Bradycardia   . CAD (coronary artery disease)    nonobstructive  . Chronic anemia 2013   h/o IDA, did not tolerate oral iron, latest normal iron studies, B12, folate  . Diverticulosis of colon    conoscopy 12/1993 (Dr. Amedeo Plenty) S/G divertics//colonoscopy L&R divertics (Dr. Amedeo Plenty) 11/08/2004  . Dizziness   . Gout   . HTN (hypertension)   . Memory loss   . Mitral regurgitation    Pulmonic regurgitation, with marked LA enlargement  . Nonischemic cardiomyopathy (HCC)    EF 40%  . Orthostatic hypotension   . PVC (premature ventricular contraction)   . PVD (peripheral vascular disease) ([place])    followed by Dr Trula Slade, treating medically (pletal and aspirin)  . RBBB longstanding  . Squamous cell carcinoma in situ of skin 2015   L ear  . Syncope       Surgical History:  Past Surgical History:  Procedure Laterality Date  . 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 APPENDECTOMY    . dexa  2004   "  shrunk 2 inches", WNL    Current Meds  Medication Sig  . cholecalciferol (VITAMIN D) 1000 units tablet Take 1,000 Units by mouth daily.  Marland Kitchen dipyridamole-aspirin (AGGRENOX) 200-25 MG 12hr capsule TAKE 1 CAPSULE BY MOUTH TWICE A DAY  . Droxidopa (NORTHERA) 100 MG CAPS Take 2 capsules in the morning, 1 capsule at lunch, and 2 capsules in the late afternoon  . pravastatin (PRAVACHOL) 20 MG tablet TAKE 1 TABLET(20 MG) BY MOUTH DAILY  . vitamin B-12 (CYANOCOBALAMIN) 500 MCG tablet Take 500 mcg by mouth daily.      Allergies:  Allergies  Allergen Reactions  . Atorvastatin Other (See Comments)    Memory trouble, confusion, fatigue  . Pletal [Cilostazol]     Social History   Socioeconomic History  . Marital status: Married    Spouse name: Not on file  .  Number of children: Not on file  . Years of education: Not on file  . Highest education level: Not on file  Occupational History  . Not on file  Social Needs  . Financial resource strain: Not on file  . Food insecurity    Worry: Not on file    Inability: Not on file  . Transportation needs    Medical: Not on file    Non-medical: Not on file  Tobacco Use  . Smoking status: Former Smoker    Packs/day: 2.00    Types: Cigarettes    Quit date: 11/10/1982    Years since quitting: 35.9  . Smokeless tobacco: Former Network engineer and Sexual Activity  . Alcohol use: Yes    Alcohol/week: 7.0 standard drinks    Types: 7 Glasses of wine per week    Comment: 1 glass of wine daily. History of heavy drinking prior to 2005.  . Drug use: No  . Sexual activity: Never  Lifestyle  . Physical activity    Days per week: Not on file    Minutes per session: Not on file  . Stress: Not on file  Relationships  . Social Herbalist on phone: Not on file    Gets together: Not on file    Attends religious service: Not on file    Active member of club or organization: Not on file    Attends meetings of clubs or organizations: Not on file    Relationship status: Not on file  . Intimate partner violence    Fear of current or ex partner: Not on file    Emotionally abused: Not on file    Physically abused: Not on file    Forced sexual activity: Not on file  Other Topics Concern  . Not on file  Social History Narrative   Lives with wife [name]). Dog passed away December 07, 2012.   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 HCPOA is wife then son and then oldest daughter etc. Advanced directives in chart 12/07/12). Does not want artificial nutrition/hydration or  prolonged life support      Does have stairs in home. BS Degree     Family History  Problem Relation Age of Onset  . Heart disease Mother 86       Natural causes with CHF  . Hypertension Mother   . Hypertension Father   .  Stroke Father        cerebral hemorrhage  . Hyperlipidemia Brother   . Heart disease Brother        before age 27  . Hypertension Brother   . Heart attack Brother   . Hypertension Other      ROS:  Please see the history of present illness.     All other systems reviewed and negative.   Physical Exam: BP 112/64   Pulse 67   Ht 5' 7.25" (1.708 m)   Wt 147 lb (66.7 kg)   SpO2 94%   BMI 22.85 kg/m  Well developed and nourished in no acute distress HENT normal Neck supple with JVP-  Clear Regular rate and rhythm, no murmurs or gallops Abd-soft with active BS No Clubbing cyanosis edema Skin-warm and dry A & Oriented  Grossly normal sensory and motor function  ECG sinus     Labs: Cardiac Enzymes No results for input(s): CKTOTAL, CKMB, TROPONINI in the last 72 hours. CBC Lab Results  Component Value Date   WBC 5.2 09/20/2018   HGB 10.8 (L) 09/20/2018   HCT 31.6 (L) 09/20/2018   MCV 96.1 09/20/2018   PLT 197.0 09/20/2018   PROTIME: No results for input(s): LABPROT, INR in the last 72 hours. Chemistry  No results for input(s): NA, K, CL, CO2, BUN, CREATININE, CALCIUM, PROT, BILITOT, ALKPHOS, ALT, AST, GLUCOSE in the last 168 hours.  Invalid input(s): LABALBU Lipids Lab Results  Component Value Date   CHOL 123 09/20/2018   HDL 50.20 09/20/2018   LDLCALC 66 09/20/2018   TRIG 36.0 09/20/2018   BNP No results found for: PROBNP Thyroid Function Tests: No results for input(s): TSH, T4TOTAL, T3FREE, THYROIDAB in the last 72 hours.  Invalid input(s): FREET3 Miscellaneous No results found for: DDIMER  Radiology/Studies:  No results found.  EKG: none   Assessment and Plan:   Hypertension  Orthostatic hypotension   Cardiomyopathy-nonischemic  Bilateral carotid stenosis  TIA  Autonomic failure          Much improved.  We will continue his current medications.  He asked again about prognosis.  I do not really have a clear answer for him; this is particularly so given the fact that there has been abatement in the progression of his symptoms.  But surprisingly there is still an 80-100 mm drop in systolic pressure;  I am not so sanguine   We spent more than 50% of our >25 min visit in face to face counseling regarding the above   Virl Axe

## 2018-09-29 NOTE — Patient Instructions (Signed)

## 2018-10-07 ENCOUNTER — Other Ambulatory Visit: Payer: Self-pay | Admitting: Family Medicine

## 2018-10-12 ENCOUNTER — Telehealth: Payer: Self-pay | Admitting: Family Medicine

## 2018-10-12 DIAGNOSIS — H903 Sensorineural hearing loss, bilateral: Secondary | ICD-10-CM

## 2018-10-12 NOTE — Telephone Encounter (Signed)
Referral placed.

## 2018-10-12 NOTE — Telephone Encounter (Signed)
Patient stated that he is needing a referral sent to [facility] to have his earring checked. He stated this is who provided his earring aids. So he would like to see them again  C/B # [phone]

## 2018-10-14 DIAGNOSIS — H2513 Age-related nuclear cataract, bilateral: Secondary | ICD-10-CM | POA: Diagnosis not present

## 2018-11-18 ENCOUNTER — Encounter: Payer: Self-pay | Admitting: Family Medicine

## 2018-11-18 DIAGNOSIS — H903 Sensorineural hearing loss, bilateral: Secondary | ICD-10-CM | POA: Diagnosis not present

## 2018-11-19 DIAGNOSIS — Z23 Encounter for immunization: Secondary | ICD-10-CM | POA: Diagnosis not present

## 2018-11-26 ENCOUNTER — Telehealth: Payer: Self-pay | Admitting: Internal Medicine

## 2018-11-26 NOTE — Telephone Encounter (Signed)
I spoke with pt. 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  He feels good at rest but when he gets up to do something he feels his energy drain. No lightheadedness upon standing but does get lightheaded after being up for awhile. Has not passed out. His symptoms have worsened since July office visit. He is asking if he needs office visit or if medications can be adjusted.

## 2018-11-26 NOTE — Telephone Encounter (Signed)
New Message   Received patient chart message in reference to patient wanting to make an appointment with Dr. Caryl Comes. (I am addressing that with him). But he also mentioned that he has zero stamina and feeling light headed. Please contact patient.

## 2018-11-26 NOTE — Telephone Encounter (Signed)
Pt states he is experience an increase in sx including light headedness and a general poor feeling. He is taking his Northera, two tabs in the AM, two tabs at lunch, and 1 tab in the evening. His sx are more pronounced in the morning. He is frustrated and is wondering what his long term prognosis looks like. I told him he will have to discuss this with Dr. Caryl Comes for a better understanding.   I have scheduled him a virtual visit for Sept 22 @ 1:30pm to discuss further with Dr. Caryl Comes. He has agreed with plan and had no additional questions.

## 2018-12-06 NOTE — Telephone Encounter (Signed)
noted 

## 2018-12-10 DIAGNOSIS — H903 Sensorineural hearing loss, bilateral: Secondary | ICD-10-CM | POA: Diagnosis not present

## 2018-12-10 DIAGNOSIS — H838X3 Other specified diseases of inner ear, bilateral: Secondary | ICD-10-CM | POA: Diagnosis not present

## 2018-12-10 DIAGNOSIS — H6123 Impacted cerumen, bilateral: Secondary | ICD-10-CM | POA: Diagnosis not present

## 2018-12-14 ENCOUNTER — Other Ambulatory Visit: Payer: Self-pay

## 2018-12-14 ENCOUNTER — Telehealth (INDEPENDENT_AMBULATORY_CARE_PROVIDER_SITE_OTHER): Payer: Medicare Other | Admitting: Internal Medicine

## 2018-12-14 ENCOUNTER — Encounter: Payer: Self-pay | Admitting: Internal Medicine

## 2018-12-14 VITALS — Ht 68.0 in

## 2018-12-14 DIAGNOSIS — I951 Orthostatic hypotension: Secondary | ICD-10-CM

## 2018-12-14 NOTE — Patient Instructions (Signed)
Medication Instructions:  The current medical regimen is effective;  continue present plan and medications.  If you need a refill on your cardiac medications before your next appointment, please call your pharmacy.   Lab work: None If you have labs (blood work) drawn today and your tests are completely normal, you will receive your results only by: Marland Kitchen MyChart Message (if you have MyChart) OR . A paper copy in the mail If you have any lab test that is abnormal or we need to change your treatment, we will call you to review the results.  Testing/Procedures: None  Follow-Up: At [facility], you and your health needs are our priority.  As part of our continuing mission to provide you with exceptional heart care, we have created designated Provider Care Teams.  These Care Teams include your primary Cardiologist (physician) and Advanced Practice Providers (APPs -  Physician Assistants and Nurse Practitioners) who all work together to provide you with the care you need, when you need it. You will need a follow up appointment in 6 weeks.  Please call our office 2 months in advance to schedule this appointment.  You may see Dr Caryl Comes or one of the following Advanced Practice Providers on your designated Care Team:   Chanetta Marshall, NP . Tommye Standard, PA-C  Thank you for choosing [facility]!!

## 2018-12-14 NOTE — Progress Notes (Signed)
Electrophysiology TeleHealth Note   Due to national recommendations of social distancing due to COVID 19, an audio/video telehealth visit is felt to be most appropriate for this patient at this time.  See MyChart message from today for the patient's consent to telehealth for Adam Clements.   Date:  12/14/2018   ID:  Adam Clements, DOB [birth year], MRN [id number]  Location: patient's home  Provider location: [address] [place]  Evaluation Performed: Follow-up visit  PCP:  Adam Bush, MD  Cardiologist:     Electrophysiologist:  Adam Clements   Chief Complaint:  Dizziness and weakness   History of Present Illness:    Adam Clements is a 84 y.o. male who presents via audio/video conferencing for a telehealth visit today. The patient did not have access to video technology/had technical difficulties with video requiring transitioning to audio format only (telephone).  All issues noted in this document were discussed and addressed.  No physical exam could be performed with this format.      Since last being seen in our clinic for orthostatic intolerance  the patient reports continued worsening in functional status weakness and dizziness.  He has had no intercurrent syncope.  He is not sure that compressive wear is helping.  He continues to take Northera with mild benefit seen at its inception.   We had looked for evidence of amyloid but have found little>> borderline PYP ; myeloma panel>>M prot 0.3  FLC assay neg    IFE urine neg  Discussed with Adam Clements   DATE TEST EF   6/17 MYOVIEW  36 %   4/17 Echo   45-50% LAE severe  10/19 HUT   profound hypotension within 3 minutes without heart rate increase    Date Cr K Hgb  7/19 1.16  11.8  10/19 1.5  10.4  6/20 1.28 5.2 10.8     The patient denies symptoms of fevers, chills, cough, or new SOB worrisome for COVID 19.    Past Medical History:  Diagnosis Date  . Allergic rhinitis   . Arthritis    Gout   . BPH (benign prostatic hypertrophy)   . Bradycardia   . CAD (coronary artery disease)    nonobstructive  . Chronic anemia 2013   h/o IDA, did not tolerate oral iron, latest normal iron studies, B12, folate  . Diverticulosis of colon    conoscopy 12/1993 (Adam. Amedeo Plenty) S/G divertics//colonoscopy L&R divertics (Adam. Amedeo Plenty) 11/08/2004  . Dizziness   . Gout   . HTN (hypertension)   . Memory loss   . Mitral regurgitation    Pulmonic regurgitation, with marked LA enlargement  . Nonischemic cardiomyopathy (HCC)    EF 40%  . Orthostatic hypotension   . PVC (premature ventricular contraction)   . PVD (peripheral vascular disease) ([place])    followed by Adam Trula Slade, treating medically (pletal and aspirin)  . RBBB longstanding  . Squamous cell carcinoma in situ of skin 2015   L ear  . Syncope     Past Surgical History:  Procedure Laterality Date  . 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 APPENDECTOMY    . dexa  2004   "shrunk 2 inches", WNL    Current Outpatient Medications  Medication Sig Dispense Refill  . cholecalciferol (VITAMIN D) 1000 units tablet Take 1,000 Units by mouth daily.    Marland Kitchen dipyridamole-aspirin (AGGRENOX) 200-25 MG 12hr capsule TAKE 1 CAPSULE BY MOUTH TWICE A DAY 60 capsule 11  .  Droxidopa (NORTHERA) 100 MG CAPS Take 2 capsules in the morning, 1 capsule at lunch, and 2 capsules in the late afternoon 450 capsule 3  . pravastatin (PRAVACHOL) 20 MG tablet TAKE 1 TABLET(20 MG) BY MOUTH DAILY 30 tablet 11  . vitamin B-12 (CYANOCOBALAMIN) 500 MCG tablet Take 500 mcg by mouth daily.     No current facility-administered medications for this visit.     Allergies:   Atorvastatin and Pletal [cilostazol]   Social History:  The patient  reports that he quit smoking about 36 years ago. His smoking use included cigarettes. He smoked 2.00 packs per day. He has quit using smokeless tobacco. He reports current alcohol use of about 7.0 standard drinks of alcohol per week. He reports  that he does not use drugs.   Family History:  The patient's   family history includes Heart attack in his brother; Heart disease in his brother; Heart disease (age of onset: 73) in his mother; Hyperlipidemia in his brother; Hypertension in his brother, father, mother, and another family member; Stroke in his father.   ROS:  Please see the history of present illness.   All other systems are personally reviewed and negative.    Exam:    Vital Signs:  Ht 5\' 8"  (1.727 m)   BMI 22.35 kg/m     Labs/Other Tests and Data Reviewed:    Recent Labs: 04/02/2018: TSH 2.52 09/20/2018: ALT 9; BUN 25; Creatinine, Ser 1.28; Hemoglobin 10.8; Platelets 197.0; Potassium 5.2; Sodium 135   Wt Readings from Last 3 Encounters:  09/29/18 147 lb (66.7 kg)  09/22/18 150 lb 2 oz (68.1 kg)  08/06/18 147 lb (66.7 kg)     Other studies personally reviewed:     ASSESSMENT & PLAN:    Hypertension  Orthostatic hypotension  Cardiomyopathy-nonischemic  Bilateral carotid stenosis  TIA  Autonomic failure      Unfortunately, his symptoms seem to be gradually worsening.  He has been intolerant of Mestinon because of diarrhea, found no benefit with ProAmatine but has done somewhat better on low doses of Northera, again intolerant of higher doses.  For now we will continue him on compressive wear with an abdominal binder fluid repletion.  I have told him today again that I suspect he will struggle with this for the rest of his days.   COVID 19 screen The patient denies symptoms of COVID 19 at this time.  The importance of social distancing was discussed today.  Follow-up:   2 m    Current medicines are reviewed at length with the patient today.   The patient does not have concerns regarding his medicines.  The following changes were made today:  none  Labs/ tests ordered today include:   No orders of the defined types were placed in this encounter.   Future tests ( post COVID )    Patient  Risk:  after full review of this patients clinical status, I feel that they are at moderate  risk at this time.  Today, I have spent 13 minutes with the patient with telehealth technology discussing the above.  Signed, Adam Axe, MD  12/14/2018 5:19 PM     [place] [id number] [phone] (office) [phone] (fax)

## 2018-12-22 ENCOUNTER — Telehealth: Payer: Self-pay | Admitting: Pharmacist

## 2018-12-22 NOTE — Telephone Encounter (Signed)
Received phone call from [place] that patient's Northera prior authorization expired yesterday.  Phone # to complete PA on the phone is Christella Scheuermann [phone] or can be completed at [email or web address]  [facility] would like a call back once PA is started at [phone] ext [phone]  Will route to [place] who assists with prior authorizations.

## 2018-12-22 NOTE — Telephone Encounter (Signed)
**Note De-Identified  Obfuscation** I have started a Northera PA through covermymeds. Key: [id number]  Per message from covermymeds this Northera PA has been approved.  While I was on hold waiting to speak with someone at [place] we received a fax from [place] stating that they have approved the pts Northera. Approval is good from 12/22/2018 until 12/22/2019  PA Case#: [id number]  I called [place] as requested and advised Renea of this Northera approval.

## 2018-12-31 ENCOUNTER — Other Ambulatory Visit: Payer: Self-pay

## 2018-12-31 DIAGNOSIS — Z20822 Contact with and (suspected) exposure to covid-19: Secondary | ICD-10-CM

## 2019-01-01 LAB — NOVEL CORONAVIRUS, NAA: SARS-CoV-2, NAA: NOT DETECTED

## 2019-01-20 ENCOUNTER — Encounter: Payer: Self-pay | Admitting: Family Medicine

## 2019-01-20 ENCOUNTER — Ambulatory Visit (INDEPENDENT_AMBULATORY_CARE_PROVIDER_SITE_OTHER): Payer: Medicare Other | Admitting: Family Medicine

## 2019-01-20 ENCOUNTER — Other Ambulatory Visit: Payer: Self-pay

## 2019-01-20 VITALS — BP 156/70 | HR 76 | Temp 97.9°F | Ht 67.25 in | Wt 145.4 lb

## 2019-01-20 DIAGNOSIS — I951 Orthostatic hypotension: Secondary | ICD-10-CM

## 2019-01-20 DIAGNOSIS — S00511A Abrasion of lip, initial encounter: Secondary | ICD-10-CM | POA: Insufficient documentation

## 2019-01-20 NOTE — Patient Instructions (Addendum)
Keep area clean with water or salt water vaseline may help it comfort wise   Let everything heal naturally  Watch for increased redness or swelling or pain let us know  Also if you get a headache   If you have to get up late at night - sit on the side of the bed for 2-3 minutes  Then stand holding on to the bed for another 2-3 minutes before walking

## 2019-01-20 NOTE — Assessment & Plan Note (Addendum)
Fall 2 nights ago in the middle of the night with brief syncope  Sustained lip injury  Counseled re: taking more time to sit and then stand before walking  He voiced understanding  Nl bp today

## 2019-01-20 NOTE — Assessment & Plan Note (Signed)
Lower lip abrasion with hematoma and scab Looks to be healing well  Adv use of saline/salt water to clean  Apply vaseline to help protect/comfort  inst to call if increased pain/swelling or redness

## 2019-01-20 NOTE — Progress Notes (Signed)
Subjective:    Patient ID: Adam Clements, male    DOB: Jul 31, 1934, 84 y.o.   MRN: [id number]  HPI  84 yo p tof [name] here with lip injury from a fall   2 am the night before last   He has a condition that makes him fall - orthostatic hypotension  Has not fallen in several months   Did not hit anything but his mouth   Split his lip -on the floor  LOC only a few times His wife was there  He was able to get bleeding stopped in 10-15 minutes   There is a white area around the cut Lower lip  Wanted to check it out   1/12 Td   No broken teeth Did not bite his tongue   Patient Active Problem List   Diagnosis Date Noted  . Lip abrasion, initial encounter 01/20/2019  . Memory loss 09/22/2018  . Inadequate oral nutritional intake 04/03/2018  . Biceps tendon rupture, right, initial encounter 04/02/2018  . 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 Vision loss, left eye 08/18/2017  . Urinary urgency 04/14/2017  . Protein malnutrition ([place]) 09/15/2016  . Bilateral sensorineural hearing loss 07/21/2016  . Decreased stamina 07/21/2016  . Stress due to family tension 07/21/2016  . RBBB 06/30/2015  . Confusional state 05/07/2015  . TIA (transient ischemic attack) 01/02/2015  . Syncope due to orthostatic hypotension 12/27/2013  . Advanced care planning/counseling discussion 12/12/2013  . Neurogenic orthostatic hypotension ([place]) 06/03/2013  . Chronic venous insufficiency 05/26/2013  . PAD (peripheral artery disease) ([place]) 08/09/2012  . Medicare annual wellness visit, subsequent 11/10/2011  . Normocytic anemia 09/18/2011  . Weight loss 09/12/2011  . HFrEF (heart failure with reduced ejection fraction) ([place]) 07/18/2010  . Atrial arrhythmia 11/15/2008  . MITRAL REGURGITATION 08/25/2008  . Biceps tendon rupture, left, sequela 10/01/2007  . Palpitations 12/29/2006  . Allergic rhinitis 10/07/2006  . DIVERTICULOSIS, COLON 10/07/2006  . HYPRTRPHY PROSTATE BNG W/O URINARY OBST/LUTS  10/07/2006  . ROSACEA 10/07/2006   Past Medical History:  Diagnosis Date  . Allergic rhinitis   . Arthritis    Gout  . BPH (benign prostatic hypertrophy)   . Bradycardia   . CAD (coronary artery disease)    nonobstructive  . Chronic anemia 2013   h/o IDA, did not tolerate oral iron, latest normal iron studies, B12, folate  . Diverticulosis of colon    conoscopy 12/1993 (Dr. Amedeo Plenty) S/G divertics//colonoscopy L&R divertics (Dr. Amedeo Plenty) 11/08/2004  . Dizziness   . Gout   . HTN (hypertension)   . Memory loss   . Mitral regurgitation    Pulmonic regurgitation, with marked LA enlargement  . Nonischemic cardiomyopathy (HCC)    EF 40%  . Orthostatic hypotension   . PVC (premature ventricular contraction)   . PVD (peripheral vascular disease) ([place])    followed by Dr Trula Slade, treating medically (pletal and aspirin)  . RBBB longstanding  . Squamous cell carcinoma in situ of skin 2015   L ear  . Syncope    Past Surgical History:  Procedure Laterality Date  . 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 APPENDECTOMY    . dexa  2004   "shrunk 2 inches", WNL   Social History   Tobacco Use  . Smoking status: Former Smoker    Packs/day: 2.00    Types: Cigarettes    Quit date: 11/10/1982    Years since quitting: 36.2  . Smokeless tobacco: Former Network engineer  Use Topics  . Alcohol use: Yes    Alcohol/week: 7.0 standard drinks    Types: 7 Glasses of wine per week    Comment: 1 glass of wine daily. History of heavy drinking prior to 2005.  . Drug use: No   Family History  Problem Relation Age of Onset  . Heart disease Mother 84       Natural causes with CHF  . Hypertension Mother   . Hypertension Father   . Stroke Father        cerebral hemorrhage  . Hyperlipidemia Brother   . Heart disease Brother        before age 68  . Hypertension Brother   . Heart attack Brother   . Hypertension Other    Allergies  Allergen Reactions  . Atorvastatin Other (See Comments)     Memory trouble, confusion, fatigue  . Pletal [Cilostazol]    Current Outpatient Medications on File Prior to Visit  Medication Sig Dispense Refill  . cholecalciferol (VITAMIN D) 1000 units tablet Take 1,000 Units by mouth daily.    Marland Kitchen dipyridamole-aspirin (AGGRENOX) 200-25 MG 12hr capsule TAKE 1 CAPSULE BY MOUTH TWICE A DAY 60 capsule 11  . Droxidopa (NORTHERA) 100 MG CAPS Take 2 capsules in the morning, 1 capsule at lunch, and 2 capsules in the late afternoon 450 capsule 3  . pravastatin (PRAVACHOL) 20 MG tablet TAKE 1 TABLET(20 MG) BY MOUTH DAILY 30 tablet 11  . vitamin B-12 (CYANOCOBALAMIN) 500 MCG tablet Take 500 mcg by mouth daily.     No current facility-administered medications on file prior to visit.      Review of Systems  Constitutional: Negative for activity change, appetite change, fatigue, fever and unexpected weight change.  HENT: Negative for congestion, rhinorrhea, sore throat and trouble swallowing.   Eyes: Negative for pain, redness, itching and visual disturbance.  Respiratory: Negative for cough, chest tightness, shortness of breath and wheezing.   Cardiovascular: Negative for chest pain and palpitations.  Gastrointestinal: Negative for abdominal pain, blood in stool, constipation, diarrhea and nausea.  Endocrine: Negative for cold intolerance, heat intolerance, polydipsia and polyuria.  Genitourinary: Negative for difficulty urinating, dysuria, frequency and urgency.  Musculoskeletal: Negative for arthralgias, joint swelling and myalgias.  Skin: Positive for wound. Negative for pallor and rash.  Neurological: Positive for syncope. Negative for dizziness, tremors, facial asymmetry, weakness, light-headedness, numbness and headaches.  Hematological: Negative for adenopathy. Does not bruise/bleed easily.  Psychiatric/Behavioral: Negative for decreased concentration and dysphoric mood. The patient is not nervous/anxious.        Objective:   Physical Exam  Constitutional:      General: He is not in acute distress.    Appearance: Normal appearance. He is normal weight. He is not ill-appearing or diaphoretic.  HENT:     Head: Normocephalic.     Nose: Nose normal.     Mouth/Throat:     Mouth: Mucous membranes are moist.     Pharynx: No posterior oropharyngeal erythema.     Comments: Aside from lip, no mouth trauma   Eyes:     General:        Right eye: No discharge.        Left eye: No discharge.     Extraocular Movements: Extraocular movements intact.     Conjunctiva/sclera: Conjunctivae normal.     Pupils: Pupils are equal, round, and reactive to light.  Neck:     Musculoskeletal: Normal range of motion and neck supple. No muscular tenderness.  Vascular: No carotid bruit.  Cardiovascular:     Rate and Rhythm: Normal rate.  Pulmonary:     Effort: Pulmonary effort is normal. No respiratory distress.     Breath sounds: Normal breath sounds. No wheezing or rales.  Lymphadenopathy:     Cervical: No cervical adenopathy.  Skin:    General: Skin is warm and dry.     Findings: No rash.     Comments: Mucosal surface of lower lip just L of midline- 1.5 cm abrastion (superficial) with hematoma and small scab Mild swelling No drainage Non tender  Neurological:     Mental Status: He is alert. Mental status is at baseline.     Cranial Nerves: No cranial nerve deficit.     Sensory: No sensory deficit.     Motor: No weakness.     Coordination: Coordination normal.     Gait: Gait normal.  Psychiatric:        Mood and Affect: Mood normal.     Comments: Talkative pleasant           Assessment & Plan:   Problem List Items Addressed This Visit      Cardiovascular and Mediastinum   Syncope due to orthostatic hypotension    Fall 2 nights ago in the middle of the night with brief syncope  Sustained lip injury  Counseled re: taking more time to sit and then stand before walking  He voiced understanding  Nl bp today         Digestive   Lip abrasion, initial encounter - Primary    Lower lip abrasion with hematoma and scab Looks to be healing well  Adv use of saline/salt water to clean  Apply vaseline to help protect/comfort  inst to call if increased pain/swelling or redness

## 2019-02-03 ENCOUNTER — Telehealth: Payer: Self-pay

## 2019-02-03 NOTE — Telephone Encounter (Signed)
Pt thinks he got the flu shot at [facility] but cannot remember the date. Per immunization record pt got the high dose flu shot on 11/19/18. Pt voiced understanding and that was all that was needed.

## 2019-02-14 ENCOUNTER — Other Ambulatory Visit: Payer: Self-pay

## 2019-02-14 ENCOUNTER — Ambulatory Visit (INDEPENDENT_AMBULATORY_CARE_PROVIDER_SITE_OTHER): Payer: Medicare Other | Admitting: Internal Medicine

## 2019-02-14 ENCOUNTER — Encounter: Payer: Self-pay | Admitting: Internal Medicine

## 2019-02-14 VITALS — BP 90/42 | HR 72 | Ht 67.25 in | Wt 145.0 lb

## 2019-02-14 DIAGNOSIS — G459 Transient cerebral ischemic attack, unspecified: Secondary | ICD-10-CM

## 2019-02-14 DIAGNOSIS — I428 Other cardiomyopathies: Secondary | ICD-10-CM | POA: Diagnosis not present

## 2019-02-14 DIAGNOSIS — I951 Orthostatic hypotension: Secondary | ICD-10-CM

## 2019-02-14 NOTE — Patient Instructions (Signed)
Medication Instructions:   *If you need a refill on your cardiac medications before your next appointment, please call your pharmacy*  Lab Work:  If you have labs (blood work) drawn today and your tests are completely normal, you will receive your results only by: Marland Kitchen MyChart Message (if you have MyChart) OR . A paper copy in the mail If you have any lab test that is abnormal or we need to change your treatment, we will call you to review the results.  Testing/Procedures:   Follow-Up: At [facility], you and your health needs are our priority.  As part of our continuing mission to provide you with exceptional heart care, we have created designated Provider Care Teams.  These Care Teams include your primary Cardiologist (physician) and Advanced Practice Providers (APPs -  Physician Assistants and Nurse Practitioners) who all work together to provide you with the care you need, when you need it.  Your next appointment:   4 month(s)  The format for your next appointment:   Virtual Visit   Provider:   Virl Axe, MD  Other Instructions

## 2019-02-14 NOTE — Progress Notes (Signed)
ELECTROPHYSIOLOGY OFFICE NOTE  Patient ID: Adam Clements, MRN: [id number], DOB/AGE: [birth year] 84 y.o. Admit date: (Not on file) Date of Consult: 02/14/2019  Primary Physician: Adam Bush, MD Primary Cardiologist: Adam Clements is a 84 y.o. male who is being seen today for the evaluation of orthostatic hypotension at the request of himself and wife.    HPI IZZAK Clements is a 84 y.o. male seen today because of profound orthostasis with lightheadedness and fatigue.  It is somewhat delayed occurring a few minutes after standing.  He has had syncope.    We have looked for evidence of amyloid but have found little>> borderline PYP ; myeloma panel>>M prot 0.3  FLC assay neg    IFE urine neg  Discussed with Dr Gaylan Gerold  He does have a history of hypertension. He does not have problems with bowel function.  He is having progressive problems with dry mouth.  No problems with bowel function and stable issues with bladder function.   He underwent testing at [facility] under the care of Dr. Fenton Malling; results were consistent with autonomic failure--probably a neurogenic orthostatic hypotension definitely.   Continues to struggle with fatigue and lightheadedness.  He was intolerant of Mestinon.  Unable to take ProAmatine.  No low-dose Northera.  He has found some benefit with thigh sleeves.  On DAPT for TIA with expressive aphasia  No interval syncope   Has to stop 3 times doing his barn chores in the morning.  Lightheadedness is worse in the morning.  Blood pressure recordings are typically 150-160 early in the morning.   DATE TEST EF   6/17 MYOVIEW  36 %   4/17 Echo   45-50% LAE severe  10/19 HUT      Date Cr K Hgb  7/19 1.16  11.8  10/19 1.5  10.4  6/20 0.28 5.2        Past Medical History:  Diagnosis Date  . Allergic rhinitis   . Arthritis    Gout  . BPH (benign prostatic hypertrophy)   . Bradycardia   . CAD (coronary artery disease)    nonobstructive  . Chronic anemia 2013   h/o IDA, did not tolerate oral iron, latest normal iron studies, B12, folate  . Diverticulosis of colon    conoscopy 12/1993 (Dr. Amedeo Plenty) S/G divertics//colonoscopy L&R divertics (Dr. Amedeo Plenty) 11/08/2004  . Dizziness   . Gout   . HTN (hypertension)   . Memory loss   . Mitral regurgitation    Pulmonic regurgitation, with marked LA enlargement  . Nonischemic cardiomyopathy (HCC)    EF 40%  . Orthostatic hypotension   . PVC (premature ventricular contraction)   . PVD (peripheral vascular disease) ([place])    followed by Dr Trula Slade, treating medically (pletal and aspirin)  . RBBB longstanding  . Squamous cell carcinoma in situ of skin 2015   L ear  . Syncope       Surgical History:  Past Surgical History:  Procedure Laterality Date  . 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 APPENDECTOMY    . dexa  2004   "shrunk 2 inches", WNL    Current Meds  Medication Sig  . cholecalciferol (VITAMIN D) 1000 units tablet Take 1,000 Units by mouth daily.  Marland Kitchen dipyridamole-aspirin (AGGRENOX) 200-25 MG 12hr capsule TAKE 1 CAPSULE BY MOUTH TWICE A DAY  . Droxidopa (NORTHERA) 100 MG CAPS Take 2 capsules in the morning, 1 capsule at  lunch, and 2 capsules in the late afternoon  . pravastatin (PRAVACHOL) 20 MG tablet TAKE 1 TABLET(20 MG) BY MOUTH DAILY  . vitamin B-12 (CYANOCOBALAMIN) 500 MCG tablet Take 500 mcg by mouth daily.      Allergies:  Allergies  Allergen Reactions  . Atorvastatin Other (See Comments)    Memory trouble, confusion, fatigue  . Pletal [Cilostazol]     Social History   Socioeconomic History  . Marital status: Married    Spouse name: Not on file  . Number of children: Not on file  . Years of education: Not on file  . Highest education level: Not on file  Occupational History  . Not on file  Social Needs  . Financial resource strain: Not on file  . Food insecurity    Worry: Not on file    Inability: Not on file  .  Transportation needs    Medical: Not on file    Non-medical: Not on file  Tobacco Use  . Smoking status: Former Smoker    Packs/day: 2.00    Types: Cigarettes    Quit date: 11/10/1982    Years since quitting: 36.2  . Smokeless tobacco: Former Network engineer and Sexual Activity  . Alcohol use: Yes    Alcohol/week: 7.0 standard drinks    Types: 7 Glasses of wine per week    Comment: 1 glass of wine daily. History of heavy drinking prior to 2005.  . Drug use: No  . Sexual activity: Never  Lifestyle  . Physical activity    Days per week: Not on file    Minutes per session: Not on file  . Stress: Not on file  Relationships  . Social Herbalist on phone: Not on file    Gets together: Not on file    Attends religious service: Not on file    Active member of club or organization: Not on file    Attends meetings of clubs or organizations: Not on file    Relationship status: Not on file  . Intimate partner violence    Fear of current or ex partner: Not on file    Emotionally abused: Not on file    Physically abused: Not on file    Forced sexual activity: Not on file  Other Topics Concern  . Not on file  Social History Narrative   Lives with wife [name]). Dog passed away 22-Nov-2012.   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 HCPOA is wife then son and then oldest daughter etc. Advanced directives in chart Nov 22, 2012). Does not want artificial nutrition/hydration or prolonged life support      Does have stairs in home. BS Degree     Family History  Problem Relation Age of Onset  . Heart disease Mother 55       Natural causes with CHF  . Hypertension Mother   . Hypertension Father   . Stroke Father        cerebral hemorrhage  .  Hyperlipidemia Brother   . Heart disease Brother        before age 47  . Hypertension Brother   . Heart attack Brother   . Hypertension Other      ROS:  Please see the  history of present illness.     All other systems reviewed and negative.   Physical Exam: BP (!) 90/42   Pulse 72   Ht 5' 7.25" (1.708 m)   Wt 145 lb (65.8 kg)   SpO2 98%   BMI 22.54 kg/m  Well developed and nourished in no acute distress HENT normal Neck supple   Clear Regular rate and rhythm, no murmurs or gallops Abd-soft with active BS No Clubbing cyanosis edema Skin-warm and dry A & Oriented  Grossly normal sensory and motor function     Labs: Cardiac Enzymes No results for input(s): CKTOTAL, CKMB, TROPONINI in the last 72 hours. CBC Lab Results  Component Value Date   WBC 5.2 09/20/2018   HGB 10.8 (L) 09/20/2018   HCT 31.6 (L) 09/20/2018   MCV 96.1 09/20/2018   PLT 197.0 09/20/2018   PROTIME: No results for input(s): LABPROT, INR in the last 72 hours. Chemistry    Assessment and Plan:   Hypertension  Orthostatic hypotension  Cardiomyopathy-nonischemic  Bilateral carotid stenosis  TIA  Autonomic failure      Blood pressure is low this morning.  Confirmed.  Encouraged to increase fluid intake particularly in the morning.  Continue with compressive wear.  May add abdominal binder  Virl Axe

## 2019-04-05 ENCOUNTER — Encounter: Payer: Self-pay | Admitting: Family Medicine

## 2019-04-05 ENCOUNTER — Ambulatory Visit (INDEPENDENT_AMBULATORY_CARE_PROVIDER_SITE_OTHER): Payer: Medicare Other | Admitting: Family Medicine

## 2019-04-05 ENCOUNTER — Other Ambulatory Visit: Payer: Self-pay

## 2019-04-05 VITALS — BP 140/68 | HR 73 | Temp 97.8°F | Ht 67.25 in | Wt 152.1 lb

## 2019-04-05 DIAGNOSIS — G459 Transient cerebral ischemic attack, unspecified: Secondary | ICD-10-CM | POA: Diagnosis not present

## 2019-04-05 DIAGNOSIS — G903 Multi-system degeneration of the autonomic nervous system: Secondary | ICD-10-CM

## 2019-04-05 DIAGNOSIS — I5020 Unspecified systolic (congestive) heart failure: Secondary | ICD-10-CM | POA: Diagnosis not present

## 2019-04-05 MED ORDER — PRAVASTATIN SODIUM 20 MG PO TABS: ORAL_TABLET | ORAL | 3 refills | Status: DC

## 2019-04-05 NOTE — Patient Instructions (Addendum)
May try funginail nail lacquer for possible nail fungus Continue abdominal binder and compression stockings.  Good to see you today, return as needed or in 6 months for medicare wellness visit.

## 2019-04-05 NOTE — Progress Notes (Signed)
This visit was conducted in person.  BP 140/68 (BP Location: Right Arm, Patient Position: Sitting, Cuff Size: Normal)   Pulse 73   Temp 97.8 F (36.6 C) (Temporal)   Ht 5' 7.25" (1.708 m)   Wt 152 lb 1 oz (69 kg)   SpO2 97%   BMI 23.64 kg/m    CC: 6 mo f/u visit Subjective:    Patient ID: Adam Clements, male    DOB: 1934/06/07, 85 y.o.   MRN: [id number]  HPI: Adam Clements is a 85 y.o. male presenting on 04/05/2019 for Follow-up (Here for 6 mo f/u.)   Saw EP 01/2019, note reviewed. Known orthostatic hypotension due to autonomic failure. Has been recommended compression stockings and abd binder - does wear occasionally. Doing well on northera (droxidopa).   He has had 1 near syncopal episode late last year - happened in the morning after he used the restroom. Notes some easier dyspnea.   Some sensitive dry skin to bilateral calves.       Relevant past medical, surgical, family and social history reviewed and updated as indicated. Interim medical history since our last visit reviewed. Allergies and medications reviewed and updated. Outpatient Medications Prior to Visit  Medication Sig Dispense Refill  . cholecalciferol (VITAMIN D) 1000 units tablet Take 1,000 Units by mouth daily.    Marland Kitchen dipyridamole-aspirin (AGGRENOX) 200-25 MG 12hr capsule TAKE 1 CAPSULE BY MOUTH TWICE A DAY 60 capsule 11  . Droxidopa (NORTHERA) 100 MG CAPS Take 2 capsules in the morning, 1 capsule at lunch, and 2 capsules in the late afternoon 450 capsule 3  . vitamin B-12 (CYANOCOBALAMIN) 500 MCG tablet Take 500 mcg by mouth daily.    . pravastatin (PRAVACHOL) 20 MG tablet TAKE 1 TABLET(20 MG) BY MOUTH DAILY 30 tablet 11   No facility-administered medications prior to visit.     Per HPI unless specifically indicated in ROS section below Review of Systems Objective:    BP 140/68 (BP Location: Right Arm, Patient Position: Sitting, Cuff Size: Normal)   Pulse 73   Temp 97.8 F (36.6 C) (Temporal)   Ht  5' 7.25" (1.708 m)   Wt 152 lb 1 oz (69 kg)   SpO2 97%   BMI 23.64 kg/m   Wt Readings from Last 3 Encounters:  04/05/19 152 lb 1 oz (69 kg)  02/14/19 145 lb (65.8 kg)  01/20/19 145 lb 6 oz (65.9 kg)    Physical Exam Vitals and nursing note reviewed.  Constitutional:      Appearance: Normal appearance.  Eyes:     Extraocular Movements: Extraocular movements intact.     Pupils: Pupils are equal, round, and reactive to light.  Cardiovascular:     Rate and Rhythm: Normal rate and regular rhythm.     Pulses: Normal pulses.     Heart sounds: Normal heart sounds. No murmur.  Pulmonary:     Effort: Pulmonary effort is normal. No respiratory distress.     Breath sounds: Normal breath sounds. No wheezing, rhonchi or rales.  Musculoskeletal:     Right lower leg: No edema.     Left lower leg: No edema.  Skin:    Findings: Rash (faint hyperpigmented slightly scaly rash to bilateral calves) present.     Comments: Thickened onychomycotic nails bilateral toes  Neurological:     Mental Status: He is alert.  Psychiatric:        Mood and Affect: Mood normal.  Behavior: Behavior normal.       Results for orders placed or performed in visit on 12/31/18  Novel Coronavirus, NAA ([facility])   Specimen: Nasopharyngeal(NP) swabs in vial transport medium   NASOPHARYNGE  TESTING  Result Value Ref Range   SARS-CoV-2, NAA Not Detected Not Detected   Assessment & Plan:  This visit occurred during the SARS-CoV-2 public health emergency.  Safety protocols were in place, including screening questions prior to the visit, additional usage of staff PPE, and extensive cleaning of exam room while observing appropriate contact time as indicated for disinfecting solutions.   Problem List Items Addressed This Visit    TIA (transient ischemic attack)    Continue aggrenox and statin.       Relevant Medications   pravastatin (PRAVACHOL) 20 MG tablet   Neurogenic orthostatic hypotension (HCC) - Primary      Appreciate EP care - stable period of neurogenic orthostatic hypotension on northera.       Relevant Medications   pravastatin (PRAVACHOL) 20 MG tablet   HFrEF (heart failure with reduced ejection fraction) (HCC)    Stable period. Seems euvolemic.       Relevant Medications   pravastatin (PRAVACHOL) 20 MG tablet       Meds ordered this encounter  Medications  . pravastatin (PRAVACHOL) 20 MG tablet    Sig: TAKE 1 TABLET(20 MG) BY MOUTH DAILY    Dispense:  90 tablet    Refill:  3   No orders of the defined types were placed in this encounter.   Patient Instructions  May try funginail nail lacquer for possible nail fungus Continue abdominal binder and compression stockings.  Good to see you today, return as needed or in 6 months for medicare wellness visit.    Follow up plan: Return in about 6 months (around 10/03/2019) for medicare wellness visit.  Ria Bush, MD

## 2019-04-05 NOTE — Assessment & Plan Note (Addendum)
Appreciate EP care - stable period of neurogenic orthostatic hypotension on northera.

## 2019-04-05 NOTE — Assessment & Plan Note (Signed)
Stable period. Seems euvolemic.

## 2019-04-05 NOTE — Assessment & Plan Note (Signed)
Continue aggrenox and statin.

## 2019-04-15 ENCOUNTER — Telehealth: Payer: Self-pay | Admitting: Internal Medicine

## 2019-04-15 NOTE — Telephone Encounter (Signed)
2 previous MyChart messages routed to Dr Caryl Comes to address. Pt is already intolerant to Mestinon and midodrine, the only other med I can think of would be fludrocortisone. Routing to Dr Caryl Comes for input.

## 2019-04-15 NOTE — Telephone Encounter (Signed)
The patient states his insurance coverage is about to run out for this medication. It is extremely expensive, so he would like to know if there is an alternative to this medication that he can be put on so he will not risk loosing his coverage

## 2019-04-15 NOTE — Telephone Encounter (Signed)
Pt c/o medication issue:  1. Name of Medication: Droxidopa ([place]) 100 MG CAPS  2. How are you currently taking this medication (dosage and times per day)? As directed  3. Are you having a reaction (difficulty breathing--STAT)? no  4. What is your medication issue? Insurance issue  The patient states that the medication is extremely expensive. His insuran

## 2019-04-19 NOTE — Telephone Encounter (Signed)
Spoke with MS last week about whether there might be any workarounds for this dilemma, she was going to work on it

## 2019-04-19 NOTE — Telephone Encounter (Signed)
I called his insurance on Friday and they said they would continue covering his Northera once he crosses his $100,000 lifetime max so I am hopeful he can just continue to use his $10/month copay card like he has been.

## 2019-05-03 ENCOUNTER — Telehealth: Payer: Self-pay | Admitting: Pharmacist

## 2019-05-03 MED ORDER — NORTHERA 100 MG PO CAPS: ORAL_CAPSULE | ORAL | 11 refills | Status: DC

## 2019-05-03 NOTE — Telephone Encounter (Signed)
Prior authorization for Adam Clements has been approved through 08/01/19. I have sent in Rx to [facility] to determine cost. Unfortunately can not longer use copay card as patient has medicare plan now.

## 2019-05-15 ENCOUNTER — Ambulatory Visit: Payer: Medicare HMO | Attending: Internal Medicine

## 2019-05-15 DIAGNOSIS — Z23 Encounter for immunization: Secondary | ICD-10-CM | POA: Insufficient documentation

## 2019-05-15 NOTE — Progress Notes (Signed)
   Covid-19 Vaccination Clinic  Name:  Adam Clements    MRN: [id number] DOB: Dec 11, 1934  05/15/2019  Adam Clements was observed post Covid-19 immunization for 15 minutes without incidence. He was provided with Vaccine Information Sheet and instruction to access the V-Safe system.   Adam Clements was instructed to call 911 with any severe reactions post vaccine: Marland Kitchen Difficulty breathing  . Swelling of your face and throat  . A fast heartbeat  . A bad rash all over your body  . Dizziness and weakness    Immunizations Administered    Name Date Dose VIS Date Route   Pfizer COVID-19 Vaccine 05/15/2019 11:56 AM 0.3 mL 03/04/2019 Intramuscular   Manufacturer: [place]   Lot: [id number]   [place]: [id number]

## 2019-05-25 ENCOUNTER — Other Ambulatory Visit: Payer: Self-pay

## 2019-05-25 ENCOUNTER — Ambulatory Visit (INDEPENDENT_AMBULATORY_CARE_PROVIDER_SITE_OTHER): Payer: Medicare HMO | Admitting: Family Medicine

## 2019-05-25 ENCOUNTER — Encounter: Payer: Self-pay | Admitting: Family Medicine

## 2019-05-25 VITALS — BP 102/58 | HR 62 | Temp 97.8°F | Ht 67.25 in | Wt 154.1 lb

## 2019-05-25 DIAGNOSIS — N5089 Other specified disorders of the male genital organs: Secondary | ICD-10-CM | POA: Diagnosis not present

## 2019-05-25 DIAGNOSIS — G903 Multi-system degeneration of the autonomic nervous system: Secondary | ICD-10-CM

## 2019-05-25 NOTE — Assessment & Plan Note (Addendum)
Ongoing. Sees EP, on northera 200/100/200. Would treat standing blood pressures only.

## 2019-05-25 NOTE — Patient Instructions (Addendum)
I think you have benign scrotal varicoceles on both sides as well as a benign epididymal cyst on the left. We can just watch these, but let me know if growing or changing.  Varicocele  A varicocele is a swelling of veins in the scrotum. The scrotum is the sac that contains the testicles. Varicoceles can occur on either side of the scrotum, but they are more common on the left side. They occur most often in teenage boys and young men. In most cases, varicoceles are not a serious problem. They are usually small and painless and do not require treatment. Tests may be done to confirm the diagnosis. Treatment may be needed if:  A varicocele is large, causes a lot of pain, or causes pain when exercising.  Varicoceles are found on both sides of the scrotum.  A varicocele causes a decrease in the size of the testicle in a growing adolescent.  The person has fertility problems. What are the causes? This condition is the result of valves in the veins not working properly. Valves in the veins help to return blood from the scrotum and testicles to the heart. If these valves do not work well, blood flows backward and backs up into the veins, which causes the veins to swell. This is similar to what happens when varicose veins form in the leg. What are the signs or symptoms? Most varicoceles do not cause any symptoms. If symptoms do occur, they may include:  Swelling on one side of the scrotum. The swelling may be more obvious when you are standing up.  A lumpy feeling in the scrotum.  A heavy feeling on one side of the scrotum.  A dull ache in the scrotum, especially after exercise or prolonged standing or sitting.  Slower growth or reduced size of the testicle on the side of the varicocele (in young males).  Problems with fathering a child (fertility). This can occur if the testicle does not grow normally or if the condition causes problems with the sperm, such as a low sperm count or sperm that are  not able to reach the egg (poor motility). How is this diagnosed? This condition is diagnosed based on:  Your medical history.  A physical exam. Your health care provider may inspect and feel (palpate) the scrotal area to check for swollen or enlarged veins.  An ultrasound. This may be done to confirm the diagnosis and to help rule out other causes of the swelling. How is this treated? Treatment is usually not needed for this condition. If you have any pain, your health care provider may prescribe or recommend medicine to help relieve it. You may need regular exams so your health care provider can monitor the varicocele to ensure that it does not cause problems. When further treatment is needed, it may involve one of these options:  Varicocelectomy. This is a surgery in which the swollen veins are tied off so that the flow of blood goes to other veins instead.  Embolization. In this procedure, a small, thin tube (catheter) is used to place metal coils or other blocking items in the veins. This cuts off the blood flow to the swollen veins. Follow these instructions at home:  Take over-the-counter and prescription medicines only as told by your health care provider.  Wear supportive underwear.  Use an athletic supporter when participating in sports activities.  Keep all follow-up visits as told by your health care provider. This is important. Contact a health care provider if:  Your pain is increasing.  You have redness in the affected area.  Your testicle becomes enlarged, swollen, or painful.  You have swelling that does not decrease when you are lying down.  One of your testicles is smaller than the other. Get help right away if:  You develop swelling in your legs.  You have difficulty breathing. Summary  Varicocele is a condition in which the veins in the scrotum are swollen or enlarged.  In most cases, varicoceles do not require treatment.  Treatment may be needed if  you have pain, have problems with infertility, or have a smaller testicle associated with the varicocele.  In some cases, the condition may be treated with a procedure to cut off the flow of blood to the swollen veins. This information is not intended to replace advice given to you by your health care provider. Make sure you discuss any questions you have with your health care provider. Document Revised: 05/28/2018 Document Reviewed: 05/28/2018 Elsevier Patient Education  [place].

## 2019-05-25 NOTE — Progress Notes (Signed)
This visit was conducted in person.  BP (!) 102/58 (BP Location: Right Arm, Cuff Size: Normal)   Pulse 62   Temp 97.8 F (36.6 C) (Temporal)   Ht 5' 7.25" (1.708 m)   Wt 154 lb 1 oz (69.9 kg)   SpO2 97%   BMI 23.95 kg/m    CC: scrotal mass Subjective:    Patient ID: Adam Clements, male    DOB: [birth year], 85 y.o.   MRN: [id number]  HPI: Adam Clements is a 85 y.o. male presenting on 05/25/2019 for Mass (C/o growth on scrotum.  Noticed a few days ago.  Denies any pain. )   Several days ago noticed 3 small growths in the scrotum that he wants evaluated. No pain. No urinary changes, dysuria.   BP elevated noted today - known neurogenic orthostatic hypotension so hesitant to manage BP aggressively      Relevant past medical, surgical, family and social history reviewed and updated as indicated. Interim medical history since our last visit reviewed. Allergies and medications reviewed and updated. Outpatient Medications Prior to Visit  Medication Sig Dispense Refill  . cholecalciferol (VITAMIN D) 1000 units tablet Take 1,000 Units by mouth daily.    Marland Kitchen dipyridamole-aspirin (AGGRENOX) 200-25 MG 12hr capsule TAKE 1 CAPSULE BY MOUTH TWICE A DAY 60 capsule 11  . Droxidopa (NORTHERA) 100 MG CAPS Take 2 capsules in the morning, 1 capsule at lunch, and 2 capsules in the late afternoon 150 capsule 11  . pravastatin (PRAVACHOL) 20 MG tablet TAKE 1 TABLET(20 MG) BY MOUTH DAILY 90 tablet 3  . vitamin B-12 (CYANOCOBALAMIN) 500 MCG tablet Take 500 mcg by mouth daily.     No facility-administered medications prior to visit.     Per HPI unless specifically indicated in ROS section below Review of Systems Objective:    BP (!) 102/58 (BP Location: Right Arm, Cuff Size: Normal)   Pulse 62   Temp 97.8 F (36.6 C) (Temporal)   Ht 5' 7.25" (1.708 m)   Wt 154 lb 1 oz (69.9 kg)   SpO2 97%   BMI 23.95 kg/m   Wt Readings from Last 3 Encounters:  05/25/19 154 lb 1 oz (69.9 kg)  04/05/19 152 lb  1 oz (69 kg)  02/14/19 145 lb (65.8 kg)    Physical Exam Vitals and nursing note reviewed.  Constitutional:      Appearance: Normal appearance. He is not ill-appearing.     Comments: Walks unassisted   Abdominal:     Hernia: There is no hernia in the left inguinal area or right inguinal area.  Genitourinary:    Penis: Normal.      Testes:        Right: Varicocele present. Mass not present.        Left: Varicocele present. Mass not present.     Epididymis:     Right: Normal.     Left: Normal.     Comments: Bilateral bulging veins above testicles, along with firm cyst at L epididymis Musculoskeletal:     Comments: Thigh high compression stockings in place, abdominal binder in place  Lymphadenopathy:     Lower Body: No right inguinal adenopathy. No left inguinal adenopathy.  Neurological:     Mental Status: He is alert.       Results for orders placed or performed in visit on 12/31/18  Novel Coronavirus, NAA ([facility])   Specimen: Nasopharyngeal(NP) swabs in vial transport medium   NASOPHARYNGE  TESTING  Result Value  Ref Range   SARS-CoV-2, NAA Not Detected Not Detected   Assessment & Plan:  This visit occurred during the SARS-CoV-2 public health emergency.  Safety protocols were in place, including screening questions prior to the visit, additional usage of staff PPE, and extensive cleaning of exam room while observing appropriate contact time as indicated for disinfecting solutions.   Problem List Items Addressed This Visit    Scrotal masses    Exam most consistent with benign varicoceles and small L epididymal cyst. Reassurance provided. If enlarging or symptomatic, discussed to let us know to schedule ultrasound. Pt agrees with plan.       Neurogenic orthostatic hypotension (HCC)    Ongoing. Sees EP, on northera 200/100/200. Would treat standing blood pressures only.           No orders of the defined types were placed in this encounter.  No orders of the defined  types were placed in this encounter.   Patient instructions: I think you have benign scrotal varicoceles on both sides as well as a benign epididymal cyst on the left. We can just watch these, but let me know if growing or changing.  Follow up plan: Return if symptoms worsen or fail to improve.  Ria Bush, MD

## 2019-05-25 NOTE — Assessment & Plan Note (Signed)
Exam most consistent with benign varicoceles and small L epididymal cyst. Reassurance provided. If enlarging or symptomatic, discussed to let us know to schedule ultrasound. Pt agrees with plan.

## 2019-05-30 DIAGNOSIS — I428 Other cardiomyopathies: Secondary | ICD-10-CM | POA: Insufficient documentation

## 2019-05-31 ENCOUNTER — Encounter: Payer: Self-pay | Admitting: Internal Medicine

## 2019-05-31 ENCOUNTER — Ambulatory Visit: Payer: Medicare HMO | Admitting: Internal Medicine

## 2019-05-31 ENCOUNTER — Other Ambulatory Visit: Payer: Self-pay

## 2019-05-31 VITALS — BP 135/71 | HR 64 | Ht 69.0 in | Wt 151.0 lb

## 2019-05-31 DIAGNOSIS — I428 Other cardiomyopathies: Secondary | ICD-10-CM | POA: Diagnosis not present

## 2019-05-31 DIAGNOSIS — G903 Multi-system degeneration of the autonomic nervous system: Secondary | ICD-10-CM

## 2019-05-31 NOTE — Patient Instructions (Addendum)
Medication Instructions:   Your physician recommends that you continue on your current medications as directed. Please refer to the Current Medication list given to you today.   *If you need a refill on your cardiac medications before your next appointment, please call your pharmacy*   Lab Work: None ordered.  If you have labs (blood work) drawn today and your tests are completely normal, you will receive your results only by: Marland Kitchen MyChart Message (if you have MyChart) OR . A paper copy in the mail If you have any lab test that is abnormal or we need to change your treatment, we will call you to review the results.   Testing/Procedures: None ordered.    Follow-Up: At [facility], you and your health needs are our priority.  As part of our continuing mission to provide you with exceptional heart care, we have created designated Provider Care Teams.  These Care Teams include your primary Cardiologist (physician) and Advanced Practice Providers (APPs -  Physician Assistants and Nurse Practitioners) who all work together to provide you with the care you need, when you need it.  We recommend signing up for the patient portal called "MyChart".  Sign up information is provided on this After Visit Summary.  MyChart is used to connect with patients for Virtual Visits (Telemedicine).  Patients are able to view lab/test results, encounter notes, upcoming appointments, etc.  Non-urgent messages can be sent to your provider as well.   To learn more about what you can do with MyChart, go to [email or web address].    Your next appointment:   09/13/2019 at 330pm

## 2019-06-01 NOTE — Progress Notes (Signed)
ELECTROPHYSIOLOGY OFFICE NOTE  Patient ID: Adam Clements, MRN: [id number], DOB/AGE: 83-Dec-1936 83 y.o. Admit date: (Not on file) Date of Consult: 06/01/2019  Primary Physician: Adam Bush, MD Primary Cardiologist: Adam Clements is a 85 y.o. male who is being seen today for the evaluation of orthostatic hypotension at the request of himself and wife.    HPI Adam Clements is a 85 y.o. male seen today because of profound orthostasis with lightheadedness and fatigue.  It is somewhat delayed occurring a few minutes after standing.  He has had syncope.    We have looked for evidence of amyloid but have found little>> borderline PYP ; myeloma panel>>M prot 0.3  FLC assay neg    IFE urine neg  Discussed with Dr Adam Clements  He does have a history of hypertension. He does not have problems with bowel function.  He is having progressive problems with dry mouth.  No problems with bowel function and stable issues with bladder function.   He underwent testing at [facility] under the care of Dr. Fenton Clements; results were consistent with autonomic failure--probably a neurogenic orthostatic hypotension definitely.   On DAPT for TIA with expressive aphasia  No interval syncope;but  significant fatigue and presyncope which are worsening and becoming more frequent. Increasingly difficult to do barnchores, but his wife says he is invigorated when he comes home from the gym  Also had two episodes of confusion--what do I do with this thing in my hand ( TV remote)  Noted by his wife to be very pale with this   VS not taken  Noted increasing urinary frequency exp late in the day.  No dry mouth or constipation      DATE TEST EF   6/17 MYOVIEW  36 %   4/17 Echo   45-50% LAE severe  10/19 HUT      Date Cr K Hgb  7/19 1.16  11.8  10/19 1.5  10.4  6/20 0.28 5.2 10.8        Past Medical History:  Diagnosis Date  . Allergic rhinitis   . Arthritis    Gout  . BPH (benign  prostatic hypertrophy)   . Bradycardia   . CAD (coronary artery disease)    nonobstructive  . Chronic anemia 2013   h/o IDA, did not tolerate oral iron, latest normal iron studies, B12, folate  . Diverticulosis of colon    conoscopy 12/1993 (Dr. Amedeo Clements) S/G divertics//colonoscopy L&R divertics (Dr. Amedeo Clements) 11/08/2004  . Dizziness   . Gout   . HTN (hypertension)   . Memory loss   . Mitral regurgitation    Pulmonic regurgitation, with marked LA enlargement  . Nonischemic cardiomyopathy (HCC)    EF 40%  . Orthostatic hypotension   . PVC (premature ventricular contraction)   . PVD (peripheral vascular disease) ([place])    followed by Dr Adam Clements, treating medically (pletal and aspirin)  . RBBB longstanding  . Squamous cell carcinoma in situ of skin 2015   L ear  . Syncope       Surgical History:  Past Surgical History:  Procedure Laterality Date  . 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 APPENDECTOMY    . dexa  2004   "shrunk 2 inches", WNL    Current Meds  Medication Sig  . cholecalciferol (VITAMIN D) 1000 units tablet Take 1,000 Units by mouth daily.  Marland Kitchen dipyridamole-aspirin (AGGRENOX) 200-25 MG 12hr capsule TAKE  1 CAPSULE BY MOUTH TWICE A DAY  . Droxidopa (NORTHERA) 100 MG CAPS Take 2 capsules in the morning, 1 capsule at lunch, and 2 capsules in the late afternoon  . pravastatin (PRAVACHOL) 20 MG tablet TAKE 1 TABLET(20 MG) BY MOUTH DAILY  . vitamin B-12 (CYANOCOBALAMIN) 500 MCG tablet Take 500 mcg by mouth daily.      Allergies:  Allergies  Allergen Reactions  . Atorvastatin Other (See Comments)    Memory trouble, confusion, fatigue  . Pletal [Cilostazol]     Social History   Socioeconomic History  . Marital status: Married    Spouse name: Not on file  . Number of children: Not on file  . Years of education: Not on file  . Highest education level: Not on file  Occupational History  . Not on file  Tobacco Use  . Smoking status: Former Smoker     Packs/day: 2.00    Types: Cigarettes    Quit date: 11/10/1982    Years since quitting: 36.5  . Smokeless tobacco: Former Network engineer and Sexual Activity  . Alcohol use: Yes    Alcohol/week: 7.0 standard drinks    Types: 7 Glasses of wine per week    Comment: 1 glass of wine daily. History of heavy drinking prior to 2005.  . Drug use: No  . Sexual activity: Never  Other Topics Concern  . Not on file  Social History Narrative   Lives with wife Adam Clements). Dog passed away 16-Nov-2012.   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 HCPOA is wife then son and then oldest daughter etc. Advanced directives in chart 16-Nov-2012). Does not want artificial nutrition/hydration or prolonged life support      Does have stairs in home. BS Degree   Social Determinants of Health   Financial Resource Strain:   . Difficulty of Paying Living Expenses: Not on file  Food Insecurity:   . Worried About Charity fundraiser in the Last Year: Not on file  . Ran Out of Food in the Last Year: Not on file  Transportation Needs:   . Lack of Transportation (Medical): Not on file  . Lack of Transportation (Non-Medical): Not on file  Physical Activity:   . Days of Exercise per Week: Not on file  . Minutes of Exercise per Session: Not on file  Stress:   . Feeling of Stress : Not on file  Social Connections:   . Frequency of Communication with Friends and Family: Not on file  . Frequency of Social Gatherings with Friends and Family: Not on file  . Attends Religious Services: Not on file  . Active Member of Clubs or Organizations: Not on file  . Attends Archivist Meetings: Not on file  . Marital Status: Not on file  Intimate Partner Violence:   . Fear of Current or  Ex-Partner: Not on file  . Emotionally Abused: Not on file  . Physically Abused: Not on file  . Sexually Abused: Not on file     Family History  Problem Relation Age of Onset  . Heart disease Mother 50       Natural causes with CHF  . Hypertension Mother   . Hypertension Father   . Stroke Father  cerebral hemorrhage  . Hyperlipidemia Brother   . Heart disease Brother        before age 15  . Hypertension Brother   . Heart attack Brother   . Hypertension Other      ROS:  Please see the history of present illness.     All other systems reviewed and negative.   Physical Exam: BP 135/71   Pulse 64   Ht 5\' 9"  (1.753 m)   Wt 151 lb (68.5 kg)   SpO2 96%   BMI 22.30 kg/m  Well developed and nourished in no acute distress HENT normal Neck supple with JVP-  flat   Clear Regular rate and rhythm, no murmurs or gallops Abd-soft with active BS No Clubbing cyanosis edema Skin-warm and dry A & Oriented  Grossly normal sensory and motor function       Labs: Cardiac Enzymes No results for input(s): CKTOTAL, CKMB, TROPONINI in the last 72 hours. CBC Lab Results  Component Value Date   WBC 5.2 09/20/2018   HGB 10.8 (L) 09/20/2018   HCT 31.6 (L) 09/20/2018   MCV 96.1 09/20/2018   PLT 197.0 09/20/2018   PROTIME: No results for input(s): LABPROT, INR in the last 72 hours. Chemistry    Assessment and Plan:   Hypertension  Orthostatic hypotension  Cardiomyopathy-nonischemic  Bilateral carotid stenosis  TIA  Confusion spells  Autonomic failure      Progressive symptoms of orthostatic hypotension in the setting of presumed autonomic failure.  We continue to anticipate that this will be inexorably progressive and potential life ending and we discussed it together with his wife for the first time. ( They are two of the most optimistic people that I know.)  We have reiterated the issues again of fluid repletion, the compressive wear, while not solving the  issues, some incrememtal benefit.   The Confusion spells with pallor may well have been related to hypotension.  They will plan to measure BP if they recur    Virl Axe

## 2019-06-03 ENCOUNTER — Encounter: Payer: Self-pay | Admitting: Family Medicine

## 2019-06-07 ENCOUNTER — Ambulatory Visit: Payer: Medicare HMO | Attending: Internal Medicine

## 2019-06-07 DIAGNOSIS — Z23 Encounter for immunization: Secondary | ICD-10-CM

## 2019-06-07 NOTE — Progress Notes (Signed)
   Covid-19 Vaccination Clinic  Name:  Adam Clements    MRN: [id number] DOB: Feb 27, 1935  06/07/2019  Mr. Adam Clements was observed post Covid-19 immunization for 15 minutes without incident. He was provided with Vaccine Information Sheet and instruction to access the V-Safe system.   Mr. Adam Clements was instructed to call 911 with any severe reactions post vaccine: Marland Kitchen Difficulty breathing  . Swelling of face and throat  . A fast heartbeat  . A bad rash all over body  . Dizziness and weakness   Immunizations Administered    Name Date Dose VIS Date Route   Pfizer COVID-19 Vaccine 06/07/2019 12:16 PM 0.3 mL 03/04/2019 Intramuscular   Manufacturer: [place]   Lot: [id number]   [place]: [id number]

## 2019-06-15 ENCOUNTER — Telehealth: Payer: Medicare Other | Admitting: Internal Medicine

## 2019-07-01 ENCOUNTER — Encounter: Payer: Self-pay | Admitting: Family Medicine

## 2019-07-01 ENCOUNTER — Ambulatory Visit (INDEPENDENT_AMBULATORY_CARE_PROVIDER_SITE_OTHER): Payer: Medicare HMO | Admitting: Family Medicine

## 2019-07-01 ENCOUNTER — Other Ambulatory Visit: Payer: Self-pay

## 2019-07-01 VITALS — BP 108/54 | HR 70 | Temp 98.2°F | Ht 69.0 in | Wt 151.6 lb

## 2019-07-01 DIAGNOSIS — G903 Multi-system degeneration of the autonomic nervous system: Secondary | ICD-10-CM

## 2019-07-01 DIAGNOSIS — M79671 Pain in right foot: Secondary | ICD-10-CM | POA: Diagnosis not present

## 2019-07-01 DIAGNOSIS — R5383 Other fatigue: Secondary | ICD-10-CM

## 2019-07-01 NOTE — Progress Notes (Signed)
This visit was conducted in person.  BP (!) 108/54 (BP Location: Right Arm, Patient Position: Sitting, Cuff Size: Normal)   Pulse 70   Temp 98.2 F (36.8 C) (Temporal)   Ht 5\' 9"  (1.753 m)   Wt 151 lb 9 oz (68.7 kg)   SpO2 95%   BMI 22.38 kg/m   No data found.  CC: lightheaded Subjective:    Patient ID: Adam Clements, male    DOB: December 18, 1934, 85 y.o.   MRN: [id number]  HPI: Adam Clements is a 85 y.o. male presenting on 07/01/2019 for Follow-up (Here to discuss lightheadness after seeing Dr. Caryl Comes [cards].  Wants a 2nd opinion. )   Known orthostatic hypotension. Has had thorough evaluation. From latest cardiology note, no evidence of amyloid, normal myeloma panel, FLC assay negative, IFE urine negative. S/p evaluation at [facility] (Dr Mare Ferrari) - autonomic failure probably neurogenic orthostatic hypotension.  He is now on northera 200/100/200.  H/o TIA with expressive aphasia on DAPT (aggrenox).   Here today - he is mainly frustrated with symptom progression from orthostasis. 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 He actually feels much better by 1-2pm.   Notes tender pressure area to R lateral foot for last few weeks. Thinks compression stockings worsen pain.      Relevant past medical, surgical, family and social history reviewed and updated as indicated. Interim medical history since our last visit reviewed. Allergies and medications reviewed and updated. Outpatient Medications Prior to Visit  Medication Sig Dispense Refill  . cholecalciferol (VITAMIN D) 1000 units tablet Take 1,000 Units by mouth daily.    Marland Kitchen dipyridamole-aspirin (AGGRENOX) 200-25 MG 12hr capsule TAKE 1 CAPSULE BY MOUTH TWICE A DAY 60 capsule 11  . Droxidopa (NORTHERA) 100 MG CAPS Take 2 capsules in the morning, 1 capsule at lunch, and 2 capsules in the late afternoon 150 capsule 11  . pravastatin (PRAVACHOL) 20 MG  tablet TAKE 1 TABLET(20 MG) BY MOUTH DAILY 90 tablet 3  . vitamin B-12 (CYANOCOBALAMIN) 500 MCG tablet Take 500 mcg by mouth daily.     No facility-administered medications prior to visit.     Per HPI unless specifically indicated in ROS section below Review of Systems Objective:    BP (!) 108/54 (BP Location: Right Arm, Patient Position: Sitting, Cuff Size: Normal)   Pulse 70   Temp 98.2 F (36.8 C) (Temporal)   Ht 5\' 9"  (1.753 m)   Wt 151 lb 9 oz (68.7 kg)   SpO2 95%   BMI 22.38 kg/m   Wt Readings from Last 3 Encounters:  07/01/19 151 lb 9 oz (68.7 kg)  05/31/19 151 lb (68.5 kg)  05/25/19 154 lb 1 oz (69.9 kg)    Physical Exam Vitals and nursing note reviewed.  Constitutional:      Appearance: Normal appearance. He is not ill-appearing.  Cardiovascular:     Rate and Rhythm: Normal rate and regular rhythm.     Pulses: Normal pulses.     Heart sounds: Normal heart sounds. No murmur.  Pulmonary:     Effort: Pulmonary effort is normal. No respiratory distress.     Breath sounds: Normal breath sounds. No wheezing, rhonchi or rales.  Musculoskeletal:     Right lower leg: No edema.     Left lower leg: No edema.     Comments: Tender hyperkeratotic area at area of bunionette at R foot  Skin:    General: Skin is warm and dry.     Findings: No rash.  Neurological:     Mental Status: He is alert.  Psychiatric:        Mood and Affect: Mood normal.        Behavior: Behavior normal.       Results for orders placed or performed in visit on 12/31/18  Novel Coronavirus, NAA ([facility])   Specimen: Nasopharyngeal(NP) swabs in vial transport medium   NASOPHARYNGE  TESTING  Result Value Ref Range   SARS-CoV-2, NAA Not Detected Not Detected   Assessment & Plan:  This visit occurred during the SARS-CoV-2 public health emergency.  Safety protocols were in place, including screening questions prior to the visit, additional usage of staff PPE, and extensive cleaning of exam room while  observing appropriate contact time as indicated for disinfecting solutions.   Problem List Items Addressed This Visit    Right foot pain    Possible corn at R bunionette. Offered podiatry eval - he is interested so will refer.      Relevant Orders   Ambulatory referral to Podiatry   Neurogenic orthostatic hypotension ([place]) - Primary    Reviewed diagnosis with patient, as well as how it is a chronic disease that may progress, not necessarily curable but manageable. Reviewed supportive care measures. Handout provided on orthostatic hypotension. rec continue northera current regimen. 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 I suggested either he change timing of chores to later in the day or discuss with EP possible option of increasing Northera dosing first thing in the mornings.       Decreased stamina    Ongoing struggle, predominant concern.          No orders of the defined types were placed in this encounter.  Orders Placed This Encounter  Procedures  . Ambulatory referral to Podiatry    Referral Priority:   Routine    Referral Type:   Consultation    Referral Reason:   Specialty Services Required    Requested Specialty:   Podiatry    Number of Visits Requested:   1    Patient instructions: You have neurogenic orthostatic hypotension causing your blood pressures to drop when you stand. Continue Northera and other supportive treatment to date.  We will have you see foot doctor for tender spot on right foot. Use callus pad until you see him.   Follow up plan: Return if symptoms worsen or fail to improve.  Ria Bush, MD

## 2019-07-01 NOTE — Patient Instructions (Addendum)
You have neurogenic orthostatic hypotension causing your blood pressures to drop when you stand. Continue Northera and other supportive treatment to date.  We will have you see foot doctor for tender spot on right foot. Use callus pad until you see him.   Orthostatic Hypotension Blood pressure is a measurement of how strongly, or weakly, your blood is pressing against the walls of your arteries. Orthostatic hypotension is a sudden drop in blood pressure that happens when you quickly change positions, such as when you get up from sitting or lying down. Arteries are blood vessels that carry blood from your heart throughout your body. When blood pressure is too low, you may not get enough blood to your brain or to the rest of your organs. This can cause weakness, light-headedness, rapid heartbeat, and fainting. This can last for just a few seconds or for up to a few minutes. Orthostatic hypotension is usually not a serious problem. However, if it happens frequently or gets worse, it may be a sign of something more serious. What are the causes? This condition may be caused by:  Sudden changes in posture, such as standing up quickly after you have been sitting or lying down.  Blood loss.  Loss of body fluids (dehydration).  Heart problems.  Hormone (endocrine) problems.  Pregnancy.  Severe infection.  Lack of certain nutrients.  Severe allergic reactions (anaphylaxis).  Certain medicines, such as blood pressure medicine or medicines that make the body lose excess fluids (diuretics). Sometimes, this condition can be caused by not taking medicine as directed, such as taking too much of a certain medicine. What increases the risk? The following factors may make you more likely to develop this condition:  Age. Risk increases as you get older.  Conditions that affect the heart or the central nervous system.  Taking certain medicines, such as blood pressure medicine or diuretics.  Being  pregnant. What are the signs or symptoms? Symptoms of this condition may include:  Weakness.  Light-headedness.  Dizziness.  Blurred vision.  Fatigue.  Rapid heartbeat.  Fainting, in severe cases. How is this diagnosed? This condition is diagnosed based on:  Your medical history.  Your symptoms.  Your blood pressure measurement. Your health care provider will check your blood pressure when you are: ? Lying down. ? Sitting. ? Standing. A blood pressure reading is recorded as two numbers, such as "120 over 80" (or 120/80). The first ("top") number is called the systolic pressure. It is a measure of the pressure in your arteries as your heart beats. The second ("bottom") number is called the diastolic pressure. It is a measure of the pressure in your arteries when your heart relaxes between beats. Blood pressure is measured in a unit called mm Hg. Healthy blood pressure for most adults is 120/80. If your blood pressure is below 90/60, you may be diagnosed with hypotension. Other information or tests that may be used to diagnose orthostatic hypotension include:  Your other vital signs, such as your heart rate and temperature.  Blood tests.  Tilt table test. For this test, you will be safely secured to a table that moves you from a lying position to an upright position. Your heart rhythm and blood pressure will be monitored during the test. How is this treated? This condition may be treated by:  Changing your diet. This may involve eating more salt (sodium) or drinking more water.  Taking medicines to raise your blood pressure.  Changing the dosage of certain medicines you  are taking that might be lowering your blood pressure.  Wearing compression stockings. These stockings help to prevent blood clots and reduce swelling in your legs. In some cases, you may need to go to the hospital for:  Fluid replacement. This means you will receive fluids through an IV.  Blood  replacement. This means you will receive donated blood through an IV (transfusion).  Treating an infection or heart problems, if this applies.  Monitoring. You may need to be monitored while medicines that you are taking wear off. Follow these instructions at home: Eating and drinking   Drink enough fluid to keep your urine pale yellow.  Eat a healthy diet, and follow instructions from your health care provider about eating or drinking restrictions. A healthy diet includes: ? Fresh fruits and vegetables. ? Whole grains. ? Lean meats. ? Low-fat dairy products.  Eat extra salt only as directed. Do not add extra salt to your diet unless your health care provider told you to do that.  Eat frequent, small meals.  Avoid standing up suddenly after eating. Medicines  Take over-the-counter and prescription medicines only as told by your health care provider. ? Follow instructions from your health care provider about changing the dosage of your current medicines, if this applies. ? Do not stop or adjust any of your medicines on your own. General instructions   Wear compression stockings as told by your health care provider.  Get up slowly from lying down or sitting positions. This gives your blood pressure a chance to adjust.  Avoid hot showers and excessive heat as directed by your health care provider.  Return to your normal activities as told by your health care provider. Ask your health care provider what activities are safe for you.  Do not use any products that contain nicotine or tobacco, such as cigarettes, e-cigarettes, and chewing tobacco. If you need help quitting, ask your health care provider.  Keep all follow-up visits as told by your health care provider. This is important. Contact a health care provider if you:  Vomit.  Have diarrhea.  Have a fever for more than 2-3 days.  Feel more thirsty than usual.  Feel weak and tired. Get help right away if you:  Have  chest pain.  Have a fast or irregular heartbeat.  Develop numbness in any part of your body.  Cannot move your arms or your legs.  Have trouble speaking.  Become sweaty or feel light-headed.  Faint.  Feel short of breath.  Have trouble staying awake.  Feel confused. Summary  Orthostatic hypotension is a sudden drop in blood pressure that happens when you quickly change positions.  Orthostatic hypotension is usually not a serious problem.  It is diagnosed by having your blood pressure taken lying down, sitting, and then standing.  It may be treated by changing your diet or adjusting your medicines. This information is not intended to replace advice given to you by your health care provider. Make sure you discuss any questions you have with your health care provider. Document Revised: 09/03/2017 Document Reviewed: 09/03/2017 Elsevier Patient Education  [place].

## 2019-07-05 DIAGNOSIS — M79671 Pain in right foot: Secondary | ICD-10-CM | POA: Insufficient documentation

## 2019-07-05 NOTE — Assessment & Plan Note (Signed)
Ongoing struggle, predominant concern.

## 2019-07-05 NOTE — Assessment & Plan Note (Addendum)
Possible corn at R bunionette. Offered podiatry eval - he is interested so will refer.

## 2019-07-05 NOTE — Assessment & Plan Note (Addendum)
Reviewed diagnosis with patient, as well as how it is a chronic disease that may progress, not necessarily curable but manageable. Reviewed supportive care measures. Handout provided on orthostatic hypotension. rec continue northera current regimen. 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 I suggested either he change timing of chores to later in the day or discuss with EP possible option of increasing Northera dosing first thing in the mornings.

## 2019-07-18 ENCOUNTER — Ambulatory Visit (INDEPENDENT_AMBULATORY_CARE_PROVIDER_SITE_OTHER): Payer: Medicare HMO

## 2019-07-18 ENCOUNTER — Other Ambulatory Visit: Payer: Self-pay

## 2019-07-18 ENCOUNTER — Encounter: Payer: Self-pay | Admitting: Podiatry

## 2019-07-18 ENCOUNTER — Other Ambulatory Visit: Payer: Self-pay | Admitting: Podiatry

## 2019-07-18 ENCOUNTER — Ambulatory Visit: Payer: Medicare HMO | Admitting: Podiatry

## 2019-07-18 VITALS — Temp 98.2°F

## 2019-07-18 DIAGNOSIS — M7671 Peroneal tendinitis, right leg: Secondary | ICD-10-CM

## 2019-07-18 DIAGNOSIS — M79671 Pain in right foot: Secondary | ICD-10-CM

## 2019-07-18 DIAGNOSIS — Q828 Other specified congenital malformations of skin: Secondary | ICD-10-CM

## 2019-07-18 NOTE — Progress Notes (Signed)
Subjective:   Patient ID: Adam Clements, male   DOB: 85 y.o.   MRN: [id number]   HPI Patient presents with a lot of pain on the outside of the right foot states is been hurting him for about a year and certain shoes are very sensitive and hard to wear.  Patient does not smoke likes to be active   Review of Systems  All other systems reviewed and are negative.       Objective:  Physical Exam Vitals and nursing note reviewed.  Constitutional:      Appearance: He is well-developed.  Pulmonary:     Effort: Pulmonary effort is normal.  Musculoskeletal:        General: Normal range of motion.  Skin:    General: Skin is warm.  Neurological:     Mental Status: He is alert.     Neurovascular status intact muscle strength was found to be adequate range of motion within normal limits with patient found to have inflammation pain base of fifth metatarsal with fluid buildup around the joint and I also noted on the plantar lateral aspect of the foot in the proximity a keratotic lesion with lucent core that is painful     Assessment:  Peroneal tendinitis right along with porokeratotic lesion with inflammation     Plan:  H&P x-ray reviewed today I did a careful injection of the peroneal base 3 mg Dexasone Kenalog 5 mg Xylocaine I then did sterile prep of the area and I debrided the lesion with no iatrogenic bleeding and patient will be seen back as indicated may require more aggressive treatment at 1 point in future  X-rays were negative for signs of fracture of this area or any bony condition

## 2019-07-18 NOTE — Progress Notes (Signed)
   Subjective:    Patient ID: Adam Clements, male    DOB: 03-28-1934, 85 y.o.   MRN: [id number]  HPI    Review of Systems  All other systems reviewed and are negative.      Objective:   Physical Exam        Assessment & Plan:

## 2019-07-19 ENCOUNTER — Encounter: Payer: Self-pay | Admitting: Family Medicine

## 2019-07-20 ENCOUNTER — Encounter: Payer: Self-pay | Admitting: Family Medicine

## 2019-07-20 ENCOUNTER — Ambulatory Visit (INDEPENDENT_AMBULATORY_CARE_PROVIDER_SITE_OTHER): Payer: Medicare HMO | Admitting: Family Medicine

## 2019-07-20 ENCOUNTER — Telehealth: Payer: Self-pay

## 2019-07-20 ENCOUNTER — Other Ambulatory Visit: Payer: Self-pay

## 2019-07-20 VITALS — BP 120/70 | HR 64 | Temp 97.9°F | Ht 69.0 in | Wt 153.5 lb

## 2019-07-20 DIAGNOSIS — R69 Illness, unspecified: Secondary | ICD-10-CM | POA: Diagnosis not present

## 2019-07-20 DIAGNOSIS — G903 Multi-system degeneration of the autonomic nervous system: Secondary | ICD-10-CM | POA: Diagnosis not present

## 2019-07-20 DIAGNOSIS — I739 Peripheral vascular disease, unspecified: Secondary | ICD-10-CM

## 2019-07-20 DIAGNOSIS — R413 Other amnesia: Secondary | ICD-10-CM | POA: Diagnosis not present

## 2019-07-20 DIAGNOSIS — F4489 Other dissociative and conversion disorders: Secondary | ICD-10-CM

## 2019-07-20 DIAGNOSIS — D649 Anemia, unspecified: Secondary | ICD-10-CM | POA: Diagnosis not present

## 2019-07-20 LAB — COMPREHENSIVE METABOLIC PANEL
ALT: 10 U/L (ref 0–53)
AST: 13 U/L (ref 0–37)
Albumin: 3.6 g/dL (ref 3.5–5.2)
Alkaline Phosphatase: 54 U/L (ref 39–117)
BUN: 23 mg/dL (ref 6–23)
CO2: 27 mEq/L (ref 19–32)
Calcium: 8.7 mg/dL (ref 8.4–10.5)
Chloride: 94 mEq/L — ABNORMAL LOW (ref 96–112)
Creatinine, Ser: 1.27 mg/dL (ref 0.40–1.50)
GFR: 54.13 mL/min — ABNORMAL LOW (ref >60.00)
Glucose, Bld: 99 mg/dL (ref 70–99)
Potassium: 4.9 mEq/L (ref 3.5–5.1)
Sodium: 124 mEq/L — ABNORMAL LOW (ref 135–145)
Total Bilirubin: 0.7 mg/dL (ref 0.2–1.2)
Total Protein: 6 g/dL (ref 6.0–8.3)

## 2019-07-20 LAB — TSH: TSH: 1.7 u[IU]/mL (ref 0.35–4.50)

## 2019-07-20 LAB — FERRITIN: Ferritin: 71.2 ng/mL (ref 22.0–322.0)

## 2019-07-20 NOTE — Patient Instructions (Addendum)
I think symptoms are coming from known neurogenic orthostatic hypotension.  Check blood pressures when an episode like this happens, laying down may help.  If worsening despite this, let us know or seek urgent care.  Continue to use compression stockings, continue lots of fluids.  Labs today.

## 2019-07-20 NOTE — Progress Notes (Addendum)
This visit was conducted in person.  BP 120/70 (BP Location: Left Arm, Patient Position: Sitting, Cuff Size: Normal)   Pulse 64   Temp 97.9 F (36.6 C) (Temporal)   Ht 5\' 9"  (1.753 m)   Wt 153 lb 8 oz (69.6 kg)   SpO2 96%   BMI 22.67 kg/m   Orthostatic VS for the past 24 hrs (Last 3 readings):  BP- Lying BP- Standing at 0 minutes  07/20/19 1251 -- 98/50  07/20/19 1249 162/80 --    CC: lightheaded Subjective:    Patient ID: Adam Clements, male    DOB: 1935/01/30, 85 y.o.   MRN: [id number]  HPI: Adam Clements is a 85 y.o. male presenting on 07/20/2019 for Dizziness (C/o feeling dizziness.  Started yesterday. Worsened during the evening but better today.  Denies any nausea/vomiting.  During episode yesterdsay, pt could not speak. )   Concern about short term memory trouble - see mychart message. Notes increasing confusion.   Known orthostatic hypotension - now on northera 5x/day.   Last night had worse lightheaded episode than normal - had difficulty getting words out - this happened about a year ago. He is already on aggrenox after TIA with expressive aphasia.   He did run out of aggrenox 2 days ago  Very hard of hearing. Hearing aides last checked several months ago - states normal eval.   No blood in stool or urine. No chest pain. No unilateral weakness or numbness. No vision changes.      Relevant past medical, surgical, family and social history reviewed and updated as indicated. Interim medical history since our last visit reviewed. Allergies and medications reviewed and updated. Outpatient Medications Prior to Visit  Medication Sig Dispense Refill  . cholecalciferol (VITAMIN D) 1000 units tablet Take 1,000 Units by mouth daily.    Marland Kitchen dipyridamole-aspirin (AGGRENOX) 200-25 MG 12hr capsule TAKE 1 CAPSULE BY MOUTH TWICE A DAY 60 capsule 11  . Droxidopa (NORTHERA) 100 MG CAPS Take 2 capsules in the morning, 1 capsule at lunch, and 2 capsules in the late afternoon 150  capsule 11  . pravastatin (PRAVACHOL) 20 MG tablet TAKE 1 TABLET(20 MG) BY MOUTH DAILY 90 tablet 3  . vitamin B-12 (CYANOCOBALAMIN) 500 MCG tablet Take 500 mcg by mouth daily.     No facility-administered medications prior to visit.     Per HPI unless specifically indicated in ROS section below Review of Systems Objective:  BP 120/70 (BP Location: Left Arm, Patient Position: Sitting, Cuff Size: Normal)   Pulse 64   Temp 97.9 F (36.6 C) (Temporal)   Ht 5\' 9"  (1.753 m)   Wt 153 lb 8 oz (69.6 kg)   SpO2 96%   BMI 22.67 kg/m   Wt Readings from Last 3 Encounters:  07/20/19 153 lb 8 oz (69.6 kg)  07/01/19 151 lb 9 oz (68.7 kg)  05/31/19 151 lb (68.5 kg)      Physical Exam Vitals and nursing note reviewed.  Constitutional:      Appearance: Normal appearance. He is not ill-appearing.  Eyes:     Extraocular Movements: Extraocular movements intact.     Pupils: Pupils are equal, round, and reactive to light.     Comments: Mild conjunctival pallor  Neck:     Vascular: No carotid bruit.  Cardiovascular:     Rate and Rhythm: Normal rate and regular rhythm.     Pulses: Normal pulses.     Heart sounds: Normal heart sounds. No  murmur.  Pulmonary:     Effort: Pulmonary effort is normal. No respiratory distress.     Breath sounds: Normal breath sounds. No wheezing, rhonchi or rales.  Musculoskeletal:        General: Normal range of motion.     Cervical back: Normal range of motion and neck supple.     Comments: Compression stockings in place  Lymphadenopathy:     Cervical: No cervical adenopathy.  Skin:    General: Skin is warm and dry.     Findings: No rash.  Neurological:     General: No focal deficit present.     Mental Status: He is alert.     Cranial Nerves: No cranial nerve deficit.     Comments:  CN 2-12 intact Station and gait intact  Psychiatric:        Mood and Affect: Mood normal.        Behavior: Behavior normal.       Results for orders placed or performed  in visit on 12/31/18  Novel Coronavirus, NAA ([facility])   Specimen: Nasopharyngeal(NP) swabs in vial transport medium   NASOPHARYNGE  TESTING  Result Value Ref Range   SARS-CoV-2, NAA Not Detected Not Detected   Assessment & Plan:  This visit occurred during the SARS-CoV-2 public health emergency.  Safety protocols were in place, including screening questions prior to the visit, additional usage of staff PPE, and extensive cleaning of exam room while observing appropriate contact time as indicated for disinfecting solutions.   Problem List Items Addressed This Visit    PAD (peripheral artery disease) ([place])    Previously on pletal - stopped due to concern over orthostasis.       Normocytic anemia    This may contribute to dizziness. Update labs. Endorses chronic issue. Last eval with GI (2013) had decided for watchful waiting - did not tolerate oral iron. periph smear 03/2018 WNL      Relevant Orders   CBC with Differential/Platelet   Ferritin   Neurogenic orthostatic hypotension ([facility]) - Primary    Anticipate recent confusional episodes due to hypoperfusion from known neurogenic orthostatic hypotension.  He seems to have ongoing concerns over diagnosis, questions about etiology, prognosis, treatment/management plan including northera and compression stockings, abd binder, good hydration status. Reviewed again.      Relevant Orders   Comprehensive metabolic panel   TSH   Memory loss    Pt concerned this is progressive.  I did ask him to return in 1 month for formal memory assessment.       Confusional state    See above. Anticipate due to episodes of cerebral hypoperfusion due to neurogenic orthostatic hypotension. I did encourage him to check BP when this happens.  In setting of running out of aggrenox, ?ischemia - he will pick up and restart aggrenox right away.  Reviewed latest echo and carotid US. No bruit heard.  Could always retest neurocognitively pending next month eval.        Relevant Orders   Comprehensive metabolic panel       No orders of the defined types were placed in this encounter.  Orders Placed This Encounter  Procedures  . Comprehensive metabolic panel  . CBC with Differential/Platelet  . Ferritin  . TSH   Patient Instructions  I think symptoms are coming from known neurogenic orthostatic hypotension.  Check blood pressures when an episode like this happens, laying down may help.  If worsening despite this, let us know or seek  urgent care.  Continue to use compression stockings, continue lots of fluids.  Labs today.    Follow up plan: Return in about 4 weeks (around 08/17/2019), or if symptoms worsen or fail to improve, for follow up visit.  Ria Bush, MD

## 2019-07-20 NOTE — Telephone Encounter (Signed)
Pt said last night was more lightheaded than usual and had difficulty in speaking; pt knew what he wanted to say but difficult to get words out. Pt has some lightheadedness today but not as bad as last night and pt can talk normally this morning. Now BP sitting is 147/65 P 74. Pt stood up slowly Standing BP 152/90 P 74. No H/A,CP,SOB and no weakness in arms or legs. Pt has no covid symptoms, no travel and no known exposure to + covid. Pt scheduled in office appt with DR G today at 12:45. UC & ED precautions given and pt voiced understanding.

## 2019-07-20 NOTE — Assessment & Plan Note (Signed)
Previously on pletal - stopped due to concern over orthostasis.

## 2019-07-20 NOTE — Assessment & Plan Note (Signed)
Pt concerned this is progressive.  I did ask him to return in 1 month for formal memory assessment.

## 2019-07-20 NOTE — Assessment & Plan Note (Signed)
Anticipate recent confusional episodes due to hypoperfusion from known neurogenic orthostatic hypotension.  He seems to have ongoing concerns over diagnosis, questions about etiology, prognosis, treatment/management plan including northera and compression stockings, abd binder, good hydration status. Reviewed again.

## 2019-07-20 NOTE — Assessment & Plan Note (Addendum)
See above. Anticipate due to episodes of cerebral hypoperfusion due to neurogenic orthostatic hypotension. I did encourage him to check BP when this happens.  In setting of running out of aggrenox, ?ischemia - he will pick up and restart aggrenox right away.  Reviewed latest echo and carotid US. No bruit heard.  Could always retest neurocognitively pending next month eval.

## 2019-07-20 NOTE — Assessment & Plan Note (Addendum)
This may contribute to dizziness. Update labs. Endorses chronic issue. Last eval with GI (2013) had decided for watchful waiting - did not tolerate oral iron. periph smear 03/2018 WNL

## 2019-07-21 LAB — CBC WITH DIFFERENTIAL/PLATELET
Basophils Absolute: 0.1 10*3/uL (ref 0.0–0.1)
Basophils Relative: 1.2 % (ref 0.0–3.0)
Eosinophils Absolute: 0.1 10*3/uL (ref 0.0–0.7)
Eosinophils Relative: 2.7 % (ref 0.0–5.0)
HCT: 31.9 % — ABNORMAL LOW (ref 39.0–52.0)
Hemoglobin: 11 g/dL — ABNORMAL LOW (ref 13.0–17.0)
Lymphocytes Relative: 18.8 % (ref 12.0–46.0)
Lymphs Abs: 1 10*3/uL (ref 0.7–4.0)
MCHC: 34.6 g/dL (ref 30.0–36.0)
MCV: 95.1 fl (ref 78.0–100.0)
Monocytes Absolute: 0.7 10*3/uL (ref 0.1–1.0)
Monocytes Relative: 13.3 % — ABNORMAL HIGH (ref 3.0–12.0)
Neutro Abs: 3.5 10*3/uL (ref 1.4–7.7)
Neutrophils Relative %: 64 % (ref 43.0–77.0)
Platelets: 225 10*3/uL (ref 150.0–400.0)
RBC: 3.35 Mil/uL — ABNORMAL LOW (ref 4.22–5.81)
RDW: 13.1 % (ref 11.5–15.5)
WBC: 5.4 10*3/uL (ref 4.0–10.5)

## 2019-07-24 ENCOUNTER — Other Ambulatory Visit: Payer: Self-pay | Admitting: Family Medicine

## 2019-07-24 DIAGNOSIS — E871 Hypo-osmolality and hyponatremia: Secondary | ICD-10-CM

## 2019-07-26 ENCOUNTER — Other Ambulatory Visit (INDEPENDENT_AMBULATORY_CARE_PROVIDER_SITE_OTHER): Payer: Medicare HMO

## 2019-07-26 DIAGNOSIS — E871 Hypo-osmolality and hyponatremia: Secondary | ICD-10-CM

## 2019-07-27 LAB — BASIC METABOLIC PANEL
BUN: 29 mg/dL — ABNORMAL HIGH (ref 6–23)
CO2: 26 mEq/L (ref 19–32)
Calcium: 8.6 mg/dL (ref 8.4–10.5)
Chloride: 96 mEq/L (ref 96–112)
Creatinine, Ser: 1.54 mg/dL — ABNORMAL HIGH (ref 0.40–1.50)
GFR: 43.33 mL/min — ABNORMAL LOW (ref >60.00)
Glucose, Bld: 107 mg/dL — ABNORMAL HIGH (ref 70–99)
Potassium: 4.6 mEq/L (ref 3.5–5.1)
Sodium: 126 mEq/L — ABNORMAL LOW (ref 135–145)

## 2019-07-28 LAB — SODIUM, URINE, RANDOM: Sodium, Ur: 61 mmol/L (ref 28–272)

## 2019-07-28 LAB — OSMOLALITY, URINE: Osmolality, Ur: 411 mOsm/kg (ref 50–1200)

## 2019-08-01 ENCOUNTER — Other Ambulatory Visit: Payer: Self-pay | Admitting: Family Medicine

## 2019-08-01 DIAGNOSIS — E871 Hypo-osmolality and hyponatremia: Secondary | ICD-10-CM

## 2019-08-04 ENCOUNTER — Other Ambulatory Visit: Payer: Self-pay

## 2019-08-04 ENCOUNTER — Other Ambulatory Visit (INDEPENDENT_AMBULATORY_CARE_PROVIDER_SITE_OTHER): Payer: Medicare HMO

## 2019-08-04 DIAGNOSIS — E871 Hypo-osmolality and hyponatremia: Secondary | ICD-10-CM | POA: Diagnosis not present

## 2019-08-04 LAB — RENAL FUNCTION PANEL
Albumin: 3.7 g/dL (ref 3.5–5.2)
BUN: 29 mg/dL — ABNORMAL HIGH (ref 6–23)
CO2: 25 mEq/L (ref 19–32)
Calcium: 8.5 mg/dL (ref 8.4–10.5)
Chloride: 96 mEq/L (ref 96–112)
Creatinine, Ser: 1.41 mg/dL (ref 0.40–1.50)
GFR: 47.97 mL/min — ABNORMAL LOW (ref >60.00)
Glucose, Bld: 98 mg/dL (ref 70–99)
Phosphorus: 3.4 mg/dL (ref 2.3–4.6)
Potassium: 5 mEq/L (ref 3.5–5.1)
Sodium: 127 mEq/L — ABNORMAL LOW (ref 135–145)

## 2019-08-10 LAB — PROTEIN ELECTROPHORESIS, SERUM, WITH REFLEX
Albumin ELP: 3.5 g/dL — ABNORMAL LOW (ref 3.8–4.8)
Alpha 1: 0.2 g/dL (ref 0.2–0.3)
Alpha 2: 0.7 g/dL (ref 0.5–0.9)
Beta 2: 0.6 g/dL — ABNORMAL HIGH (ref 0.2–0.5)
Beta Globulin: 0.3 g/dL — ABNORMAL LOW (ref 0.4–0.6)
Gamma Globulin: 0.6 g/dL — ABNORMAL LOW (ref 0.8–1.7)
Total Protein: 6 g/dL — ABNORMAL LOW (ref 6.1–8.1)

## 2019-08-10 LAB — CORTISOL-AM, BLOOD: Cortisol - AM: 17.5 ug/dL

## 2019-08-10 LAB — IFE INTERPRETATION: Immunofix Electr Int: DETECTED

## 2019-08-13 ENCOUNTER — Other Ambulatory Visit: Payer: Self-pay | Admitting: Family Medicine

## 2019-08-13 ENCOUNTER — Encounter: Payer: Self-pay | Admitting: Family Medicine

## 2019-08-13 DIAGNOSIS — D472 Monoclonal gammopathy: Secondary | ICD-10-CM | POA: Insufficient documentation

## 2019-08-13 DIAGNOSIS — D649 Anemia, unspecified: Secondary | ICD-10-CM

## 2019-08-16 ENCOUNTER — Telehealth: Payer: Self-pay | Admitting: Hematology and Oncology

## 2019-08-16 NOTE — Telephone Encounter (Signed)
Received a new hem referral from Dr. Danise Mina for gus and normocytic anemia. Adam Clements has been cld and scheduled to see Dr. Lorenso Courier on 6/2 at [place]. Pt aware to arrive 15 minutes early.

## 2019-08-23 NOTE — Progress Notes (Signed)
[place] [phone]   Fax:([phone]) [place] NOTE  Patient Care Team: Ria Bush, MD as PCP - General (Family Medicine)  Hematological/Oncological History # IgA Lambda Monoclonal Gammopathy of Undetermined Significance 1) 11/10/2017: SPEP showed M protein 0.3, IgA. UPEP showed no M protein. Ordered by cardiology on concern for amyloidosis 2) 12/02/2017: Kappa 18.2, Lambda 17.5, Ratio: 1.04 3) 08/04/2019: SPEP showed no M protein, though IFE detected an IgA lambda monoclonal protein.  4) 08/24/2019: establish care with Dr. Lorenso Courier   CHIEF COMPLAINTS/PURPOSE OF CONSULTATION:  "MGUS"  HISTORY OF PRESENTING ILLNESS:  Adam Clements 85 y.o. male with medical history significant for CAD, BPH, HTN, and mitral regurgitation who presents for evaluation of a monoclonal gammopathy.   On review of the previous records Adam Clements he underwent evaluation for monoclonal gammopathy on 11/10/2017 as part of a cardiology work-up on concern for amyloidosis.  The SPEP at that time showed an M protein of 0.3, IgA specificity on IFE.  The UPEP showed no M protein.  A month later on 12/02/2017 the patient had serum free light chains ordered which showed a kappa 18.2, lambda 17.5, and a ratio 1.04.  Subsequently the patient had retesting done on 08/04/2019 which showed undetectable M protein, though IFE detected an IgA lambda monoclonal myopathy.  On exam today Adam Clements notes that he enjoys good health.  He is most concerned about a "condition" that he is followed by cardiology for.  He notes that it does interfere with his day-to-day life, but he continues to try to be active.  He notes that he still goes to the gym about 3 days/week and is quite active.  He notes that he feels like he does not have quite the strength that he used to and is down to "35%" of his baseline level of strength.  He is unsure of the cause the review of prior records show that he is followed  for orthostatic hypotension.  He notes that his diet is near vegetarian with some occasional fish and very little in the way of red meat or chicken.  On further discussion he notes that he has maintained a stable weight and that he has not been having any issues with bleeding, bruising, or recurrent infections.  He was a previous smoker started at age 89 and then quit smoking in 1985 and smoked at least 1 pack/day.  He is no longer drinking alcohol but notes that he did in the past and that was never a major health issue for him.  He reports that he does occasionally have some issues with back pain and bone pain, but nothing fixed and focal.  His family history is unremarkable save for a brother who died of heart disease.  There is no family history of cancer.  Otherwise he has no additional questions or concerns today.  A full 10 point ROS is listed below.  MEDICAL HISTORY:  Past Medical History:  Diagnosis Date  . Allergic rhinitis   . Arthritis    Gout  . BPH (benign prostatic hypertrophy)   . Bradycardia   . CAD (coronary artery disease)    nonobstructive  . Chronic anemia 2013   h/o IDA, did not tolerate oral iron, latest normal iron studies, B12, folate  . Diverticulosis of colon    conoscopy 12/1993 (Dr. Amedeo Plenty) S/G divertics//colonoscopy L&R divertics (Dr. Amedeo Plenty) 11/08/2004  . Dizziness   . Gout   . HTN (hypertension)   . Memory loss   .  Mitral regurgitation    Pulmonic regurgitation, with marked LA enlargement  . Nonischemic cardiomyopathy (HCC)    EF 40%  . Orthostatic hypotension   . PVC (premature ventricular contraction)   . PVD (peripheral vascular disease) ([place])    followed by Dr Trula Slade, treating medically (pletal and aspirin)  . RBBB longstanding  . Squamous cell carcinoma in situ of skin 2015   L ear  . Syncope     SURGICAL HISTORY: Past Surgical History:  Procedure Laterality Date  . 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 APPENDECTOMY    . dexa  2004     "shrunk 2 inches", WNL    SOCIAL HISTORY: Social History   Socioeconomic History  . Marital status: Married    Spouse name: Not on file  . Number of children: Not on file  . Years of education: Not on file  . Highest education level: Not on file  Occupational History  . Not on file  Tobacco Use  . Smoking status: Former Smoker    Packs/day: 2.00    Types: Cigarettes    Quit date: 11/10/1982    Years since quitting: 36.8  . Smokeless tobacco: Former Network engineer and Sexual Activity  . Alcohol use: Yes    Alcohol/week: 7.0 standard drinks    Types: 7 Glasses of wine per week    Comment: 1 glass of wine daily. History of heavy drinking prior to 2005.  . Drug use: No  . Sexual activity: Never  Other Topics Concern  . Not on file  Social History Narrative   Lives with wife [name]). Dog passed away October 27, 2012.   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 HCPOA is wife then son and then oldest daughter etc. Advanced directives in chart 2012-10-27). Does not want artificial nutrition/hydration or prolonged life support      Does have stairs in home. BS Degree   Social Determinants of Health   Financial Resource Strain:   . Difficulty of Paying Living Expenses:   Food Insecurity:   . Worried About Charity fundraiser in the Last Year:   . Arboriculturist in the Last Year:   Transportation Needs:   . Film/video editor (Medical):   Marland Kitchen Lack of Transportation (Non-Medical):   Physical Activity:   . Days of Exercise per Week:   . Minutes of Exercise per Session:   Stress:   . Feeling of Stress :   Social Connections:   . Frequency of Communication with Friends and Family:   . Frequency of Social Gatherings with Friends and Family:    . Attends Religious Services:   . Active Member of Clubs or Organizations:   . Attends Archivist Meetings:   Marland Kitchen Marital Status:   Intimate Partner Violence:   . Fear of Current or Ex-Partner:   . Emotionally Abused:   Marland Kitchen Physically Abused:   . Sexually Abused:     FAMILY HISTORY: Family History  Problem Relation Age of Onset  . Heart disease Mother 38       Natural causes with CHF  . Hypertension Mother   . Hypertension Father   . Stroke Father        cerebral hemorrhage  .  Hyperlipidemia Brother   . Heart disease Brother        before age 76  . Hypertension Brother   . Heart attack Brother   . Hypertension Other     ALLERGIES:  is allergic to atorvastatin and pletal [cilostazol].  MEDICATIONS:  Current Outpatient Medications  Medication Sig Dispense Refill  . cholecalciferol (VITAMIN D) 1000 units tablet Take 1,000 Units by mouth daily.    Marland Kitchen dipyridamole-aspirin (AGGRENOX) 200-25 MG 12hr capsule TAKE 1 CAPSULE BY MOUTH TWICE A DAY 60 capsule 11  . Droxidopa (NORTHERA) 100 MG CAPS Take 2 capsules in the morning, 1 capsule at lunch, and 2 capsules in the late afternoon 150 capsule 11  . pravastatin (PRAVACHOL) 20 MG tablet TAKE 1 TABLET(20 MG) BY MOUTH DAILY 90 tablet 3  . vitamin B-12 (CYANOCOBALAMIN) 500 MCG tablet Take 500 mcg by mouth daily.     No current facility-administered medications for this visit.    REVIEW OF SYSTEMS:   Constitutional: ( - ) fevers, ( - )  chills , ( - ) night sweats Eyes: ( - ) blurriness of vision, ( - ) double vision, ( - ) watery eyes Ears, nose, mouth, throat, and face: ( - ) mucositis, ( - ) sore throat Respiratory: ( - ) cough, ( - ) dyspnea, ( - ) wheezes Cardiovascular: ( - ) palpitation, ( - ) chest discomfort, ( - ) lower extremity swelling Gastrointestinal:  ( - ) nausea, ( - ) heartburn, ( - ) change in bowel habits Skin: ( - ) abnormal skin rashes Lymphatics: ( - ) new lymphadenopathy, ( - ) easy  bruising Neurological: ( - ) numbness, ( - ) tingling, ( - ) new weaknesses Behavioral/Psych: ( - ) mood change, ( - ) new changes  All other systems were reviewed with the patient and are negative.  PHYSICAL EXAMINATION: ECOG PERFORMANCE STATUS: 1 - Symptomatic but completely ambulatory  Vitals:   08/24/19 0915  BP: 106/61  Pulse: 72  Resp: 18  Temp: 97.9 F (36.6 C)  SpO2: 99%   Filed Weights   08/24/19 0915  Weight: 148 lb 8 oz (67.4 kg)    GENERAL: well appearing elderly Caucasian male in NAD  SKIN: skin color, texture, turgor are normal, no rashes or significant lesions EYES: conjunctiva are pink and non-injected, sclera clear LUNGS: clear to auscultation and percussion with normal breathing effort HEART: regular rate & rhythm and no murmurs and no lower extremity edema Musculoskeletal: no cyanosis of digits and no clubbing  PSYCH: alert & oriented x 3, fluent speech NEURO: no focal motor/sensory deficits  LABORATORY DATA:  I have reviewed the data as listed CBC Latest Ref Rng & Units 08/24/2019 07/20/2019 09/20/2018  WBC 4.0 - 10.5 K/uL 4.2 5.4 5.2  Hemoglobin 13.0 - 17.0 g/dL 10.4(L) 11.0(L) 10.8(L)  Hematocrit 39.0 - 52.0 % 30.4(L) 31.9(L) 31.6(L)  Platelets 150 - 400 K/uL 204 225.0 197.0    CMP Latest Ref Rng & Units 08/24/2019 08/04/2019 07/26/2019  Glucose 70 - 99 mg/dL 95 98 107(H)  BUN 8 - 23 mg/dL 26(H) 29(H) 29(H)  Creatinine 0.61 - 1.24 mg/dL 1.26(H) 1.41 1.54(H)  Sodium 135 - 145 mmol/L 131(L) 127(L) 126(L)  Potassium 3.5 - 5.1 mmol/L 4.5 5.0 4.6  Chloride 98 - 111 mmol/L 101 96 96  CO2 22 - 32 mmol/L '22 25 26  ' Calcium 8.9 - 10.3 mg/dL 8.6(L) 8.5 8.6  Total Protein 6.5 - 8.1 g/dL 5.8(L) 6.0(L) -  Total  Bilirubin 0.3 - 1.2 mg/dL 0.5 - -  Alkaline Phos 38 - 126 U/L 53 - -  AST 15 - 41 U/L 13(L) - -  ALT 0 - 44 U/L 12 - -     PATHOLOGY: None relevant to review.    RADIOGRAPHIC STUDIES: No results found.  ASSESSMENT & PLAN ENOS MUHL 85 y.o.  male with medical history significant for CAD, BPH, HTN, and mitral regurgitation who presents for evaluation of a monoclonal gammopathy.  After review of the labs, review the records, and discussion with the patient the findings are most consistent with an IgA lambda monoclonal gammopathy of undetermined significance.  The patient has had previous recordings of this dating back to 2019 and it appears it has been stable within that timeframe.  Today we will do a full assessment of this monoclonal gammopathy to assure that it is not indicative of a monoclonal neoplasm.  At this time it appears he has a mild increase in M protein, but no other multiple myeloma labs were ordered during the last assessment.  Technically an IgA monoclonal gammopathy on its own would merit a bone marrow biopsy, however in the absence of anemia or kidney dysfunction and with normal serum free light chains I would not aggressively pursue that at his age.  Once all of the information is available from our below lab work-up we could discuss the risks and benefits of pursuing a bone marrow biopsy.  We will additionally order a urine protein electrophoresis as well as a metastatic survey to look for lytic lesions.  Reassuringly the patient has had this IgA monoclonal myopathy for at least 2 years without any decline in his other blood counts.  We will plan to have Mr. Vespa return in approximately 6 months time assuming no further evaluation is required.  # IgA Lambda Monoclonal Gammopathy of Undetermined Significance --today will repeat SPEP, UPEP, SFLC. Additionally will collected Beta 2 microglobulin and LDH.  --repeat baseline CBC and CMP --will perform a DG bone survey to assess for lytic lesions. --Given his non IgG MGUS a bone marrow biopsy could be justified based on guideline recommendations, however it appears to have been stable since at least 2019. Will await the results of the above tests before considering bone marrow  biopsy -- RTC in 6 month or sooner if indicated by the above labs.   Orders Placed This Encounter  Procedures  . DG Bone Survey Met    Standing Status:   Future    Standing Expiration Date:   08/23/2020    Order Specific Question:   Reason for Exam (SYMPTOM  OR DIAGNOSIS REQUIRED)    Answer:   new MGUS diagnosis    Order Specific Question:   Preferred imaging location?    Answer:   [facility]    Order Specific Question:   Radiology Contrast Protocol - do NOT remove file path    Answer:   [email or web address]  . CBC with Differential ([place] Only)    Standing Status:   Future    Number of Occurrences:   1    Standing Expiration Date:   08/23/2020  . CMP ([place] only)    Standing Status:   Future    Number of Occurrences:   1    Standing Expiration Date:   08/23/2020  . Lactate dehydrogenase (LDH)    Standing Status:   Future    Number of Occurrences:   1    Standing Expiration Date:  08/23/2020  . Beta 2 microglobulin    Standing Status:   Future    Number of Occurrences:   1    Standing Expiration Date:   08/23/2020  . Multiple Myeloma Panel (SPEP&IFE w/QIG)    Standing Status:   Future    Number of Occurrences:   1    Standing Expiration Date:   08/23/2020  . Kappa/lambda light chains    Standing Status:   Future    Number of Occurrences:   1    Standing Expiration Date:   08/23/2020  . 24-Hr Ur UPEP/UIFE/Light Chains/TP    Standing Status:   Future    Standing Expiration Date:   08/23/2020    All questions were answered. The patient knows to call the clinic with any problems, questions or concerns.  A total of more than 60 minutes were spent on this encounter and over half of that time was spent on counseling and coordination of care as outlined above.   Ledell Peoples, MD Department of Hematology/Oncology [place] at [facility] Phone: [phone] Pager: [phone] Email:  [email or web address]  08/24/2019 6:37 PM

## 2019-08-24 ENCOUNTER — Encounter: Payer: Self-pay | Admitting: Family Medicine

## 2019-08-24 ENCOUNTER — Inpatient Hospital Stay: Payer: Medicare HMO

## 2019-08-24 ENCOUNTER — Ambulatory Visit (INDEPENDENT_AMBULATORY_CARE_PROVIDER_SITE_OTHER): Payer: Medicare HMO | Admitting: Family Medicine

## 2019-08-24 ENCOUNTER — Encounter: Payer: Self-pay | Admitting: Hematology and Oncology

## 2019-08-24 ENCOUNTER — Other Ambulatory Visit: Payer: Self-pay

## 2019-08-24 ENCOUNTER — Inpatient Hospital Stay: Payer: Medicare HMO | Attending: Hematology and Oncology | Admitting: Hematology and Oncology

## 2019-08-24 VITALS — BP 144/76 | HR 67 | Temp 97.5°F | Ht 69.0 in | Wt 149.2 lb

## 2019-08-24 VITALS — BP 106/61 | HR 72 | Temp 97.9°F | Resp 18 | Ht 69.0 in | Wt 148.5 lb

## 2019-08-24 DIAGNOSIS — I2510 Atherosclerotic heart disease of native coronary artery without angina pectoris: Secondary | ICD-10-CM

## 2019-08-24 DIAGNOSIS — D472 Monoclonal gammopathy: Secondary | ICD-10-CM

## 2019-08-24 DIAGNOSIS — I119 Hypertensive heart disease without heart failure: Secondary | ICD-10-CM

## 2019-08-24 DIAGNOSIS — D649 Anemia, unspecified: Secondary | ICD-10-CM

## 2019-08-24 DIAGNOSIS — G903 Multi-system degeneration of the autonomic nervous system: Secondary | ICD-10-CM

## 2019-08-24 DIAGNOSIS — G3184 Mild cognitive impairment, so stated: Secondary | ICD-10-CM | POA: Diagnosis not present

## 2019-08-24 DIAGNOSIS — Z87891 Personal history of nicotine dependence: Secondary | ICD-10-CM | POA: Diagnosis not present

## 2019-08-24 LAB — CMP (CANCER CENTER ONLY)
ALT: 12 U/L (ref 0–44)
AST: 13 U/L — ABNORMAL LOW (ref 15–41)
Albumin: 3.3 g/dL — ABNORMAL LOW (ref 3.5–5.0)
Alkaline Phosphatase: 53 U/L (ref 38–126)
Anion gap: 8 (ref 5–15)
BUN: 26 mg/dL — ABNORMAL HIGH (ref 8–23)
CO2: 22 mmol/L (ref 22–32)
Calcium: 8.6 mg/dL — ABNORMAL LOW (ref 8.9–10.3)
Chloride: 101 mmol/L (ref 98–111)
Creatinine: 1.26 mg/dL — ABNORMAL HIGH (ref 0.61–1.24)
GFR, Est AFR Am: >60 mL/min (ref >60)
GFR, Estimated: 52 mL/min — ABNORMAL LOW (ref >60)
Glucose, Bld: 95 mg/dL (ref 70–99)
Potassium: 4.5 mmol/L (ref 3.5–5.1)
Sodium: 131 mmol/L — ABNORMAL LOW (ref 135–145)
Total Bilirubin: 0.5 mg/dL (ref 0.3–1.2)
Total Protein: 5.8 g/dL — ABNORMAL LOW (ref 6.5–8.1)

## 2019-08-24 LAB — CBC WITH DIFFERENTIAL (CANCER CENTER ONLY)
Abs Immature Granulocytes: 0.01 10*3/uL (ref 0.00–0.07)
Basophils Absolute: 0 10*3/uL (ref 0.0–0.1)
Basophils Relative: 1 %
Eosinophils Absolute: 0.1 10*3/uL (ref 0.0–0.5)
Eosinophils Relative: 3 %
HCT: 30.4 % — ABNORMAL LOW (ref 39.0–52.0)
Hemoglobin: 10.4 g/dL — ABNORMAL LOW (ref 13.0–17.0)
Immature Granulocytes: 0 %
Lymphocytes Relative: 16 %
Lymphs Abs: 0.7 10*3/uL (ref 0.7–4.0)
MCH: 32.4 pg (ref 26.0–34.0)
MCHC: 34.2 g/dL (ref 30.0–36.0)
MCV: 94.7 fL (ref 80.0–100.0)
Monocytes Absolute: 0.5 10*3/uL (ref 0.1–1.0)
Monocytes Relative: 11 %
Neutro Abs: 2.9 10*3/uL (ref 1.7–7.7)
Neutrophils Relative %: 69 %
Platelet Count: 204 10*3/uL (ref 150–400)
RBC: 3.21 MIL/uL — ABNORMAL LOW (ref 4.22–5.81)
RDW: 11.9 % (ref 11.5–15.5)
WBC Count: 4.2 10*3/uL (ref 4.0–10.5)
nRBC: 0 % (ref 0.0–0.2)

## 2019-08-24 LAB — LACTATE DEHYDROGENASE: LDH: 175 U/L (ref 98–192)

## 2019-08-24 NOTE — Assessment & Plan Note (Addendum)
Appreciate heme care. Established today - pending rpt labwork. Patient did not have any questions regarding today's apponitment.

## 2019-08-24 NOTE — Patient Instructions (Addendum)
Memory testing shows ongoing memory trouble. We think this may come from low blood pressure episodes and is why we are on Northera. If worsening memory trouble noted, we could refer you back to neurology for another opinion.

## 2019-08-24 NOTE — Progress Notes (Signed)
This visit was conducted in person.  BP (!) 144/76 (BP Location: Right Arm, Patient Position: Sitting, Cuff Size: Normal)   Pulse 67   Temp (!) 97.5 F (36.4 C) (Temporal)   Ht '5\' 9"'  (1.753 m)   Wt 149 lb 3 oz (67.7 kg)   SpO2 96%   BMI 22.03 kg/m   BP Readings from Last 3 Encounters:  08/24/19 (!) 144/76  08/24/19 106/61  07/20/19 120/70   CC: 1 mo f/u visit  Subjective:    Patient ID: Adam Clements, male    DOB: [birth year], 85 y.o.   MRN: [id number]  HPI: Adam Clements is a 85 y.o. male presenting on 08/24/2019 for Follow-up (Here for 1 mo f/u and memory assessment.)   See prior note for details.  Saw onc today - had rpt labs for IgA lambda MGUS, to consider bone marrow biopsy.  He feels he's getting adequate nutrition. Has decreased meat intake (as wife is eating less red meat). Eats 3 meals a day.  Continues northera but occasionally forgets lunch and evening doses.  He has noticed progression of memory trouble over the past 6-12 months.   Geriatric Assessment: Activities of Daily Living:     Bathing- independent     Dressing- independent     Eating- independent     Toileting- independent     Transferring- independent - ongoing balance trouble     Continence- independent  Overall Assessment: independent   Instrumental Activities of Daily Living:     Transportation- independent     Meal/Food Preparation- independent     Shopping Errands- independent     Housekeeping/Chores- independent     Money Management/Finances- independent but notes confusion with this at times. His did not like when tax preparer started managing, wife [name] unable to keep track of this either.     Medication Management- independent     Ability to Use Telephone- independent     Laundry- independent  Overall Assessment: independent   Mental Status Exam: 23/30, 25/30 with cue (value/max value)     Clock Drawing Score: 4/4          Relevant past medical, surgical, family and social  history reviewed and updated as indicated. Interim medical history since our last visit reviewed. Allergies and medications reviewed and updated. Outpatient Medications Prior to Visit  Medication Sig Dispense Refill  . cholecalciferol (VITAMIN D) 1000 units tablet Take 1,000 Units by mouth daily.    Marland Kitchen dipyridamole-aspirin (AGGRENOX) 200-25 MG 12hr capsule TAKE 1 CAPSULE BY MOUTH TWICE A DAY 60 capsule 11  . Droxidopa (NORTHERA) 100 MG CAPS Take 2 capsules in the morning, 1 capsule at lunch, and 2 capsules in the late afternoon 150 capsule 11  . pravastatin (PRAVACHOL) 20 MG tablet TAKE 1 TABLET(20 MG) BY MOUTH DAILY 90 tablet 3  . vitamin B-12 (CYANOCOBALAMIN) 500 MCG tablet Take 500 mcg by mouth daily.     No facility-administered medications prior to visit.     Per HPI unless specifically indicated in ROS section below Review of Systems Objective:  BP (!) 144/76 (BP Location: Right Arm, Patient Position: Sitting, Cuff Size: Normal)   Pulse 67   Temp (!) 97.5 F (36.4 C) (Temporal)   Ht '5\' 9"'  (1.753 m)   Wt 149 lb 3 oz (67.7 kg)   SpO2 96%   BMI 22.03 kg/m   Wt Readings from Last 3 Encounters:  08/24/19 149 lb 3 oz (67.7 kg)  08/24/19 148 lb  8 oz (67.4 kg)  07/20/19 153 lb 8 oz (69.6 kg)      Physical Exam Vitals and nursing note reviewed.  Constitutional:      Appearance: Normal appearance. He is not ill-appearing.  Musculoskeletal:     Right lower leg: No edema.     Left lower leg: No edema.  Neurological:     Mental Status: He is alert.  Psychiatric:        Mood and Affect: Mood normal.        Behavior: Behavior normal.       Results for orders placed or performed in visit on 08/24/19  Lactate dehydrogenase (LDH)  Result Value Ref Range   LDH 175 98 - 192 U/L  CMP ([place] only)  Result Value Ref Range   Sodium 131 (L) 135 - 145 mmol/L   Potassium 4.5 3.5 - 5.1 mmol/L   Chloride 101 98 - 111 mmol/L   CO2 22 22 - 32 mmol/L   Glucose, Bld 95 70 - 99  mg/dL   BUN 26 (H) 8 - 23 mg/dL   Creatinine 1.26 (H) 0.61 - 1.24 mg/dL   Calcium 8.6 (L) 8.9 - 10.3 mg/dL   Total Protein 5.8 (L) 6.5 - 8.1 g/dL   Albumin 3.3 (L) 3.5 - 5.0 g/dL   AST 13 (L) 15 - 41 U/L   ALT 12 0 - 44 U/L   Alkaline Phosphatase 53 38 - 126 U/L   Total Bilirubin 0.5 0.3 - 1.2 mg/dL   GFR, Est Non Af Am 52 (L) >60 mL/min   GFR, Est AFR Am >60 >60 mL/min   Anion gap 8 5 - 15  CBC with Differential ([facility] Only)  Result Value Ref Range   WBC Count 4.2 4.0 - 10.5 K/uL   RBC 3.21 (L) 4.22 - 5.81 MIL/uL   Hemoglobin 10.4 (L) 13.0 - 17.0 g/dL   HCT 30.4 (L) 39.0 - 52.0 %   MCV 94.7 80.0 - 100.0 fL   MCH 32.4 26.0 - 34.0 pg   MCHC 34.2 30.0 - 36.0 g/dL   RDW 11.9 11.5 - 15.5 %   Platelet Count 204 150 - 400 K/uL   nRBC 0.0 0.0 - 0.2 %   Neutrophils Relative % 69 %   Neutro Abs 2.9 1.7 - 7.7 K/uL   Lymphocytes Relative 16 %   Lymphs Abs 0.7 0.7 - 4.0 K/uL   Monocytes Relative 11 %   Monocytes Absolute 0.5 0.1 - 1.0 K/uL   Eosinophils Relative 3 %   Eosinophils Absolute 0.1 0.0 - 0.5 K/uL   Basophils Relative 1 %   Basophils Absolute 0.0 0.0 - 0.1 K/uL   Immature Granulocytes 0 %   Abs Immature Granulocytes 0.01 0.00 - 0.07 K/uL   Assessment & Plan:  This visit occurred during the SARS-CoV-2 public health emergency.  Safety protocols were in place, including screening questions prior to the visit, additional usage of staff PPE, and extensive cleaning of exam room while observing appropriate contact time as indicated for disinfecting solutions.   Problem List Items Addressed This Visit    Neurogenic orthostatic hypotension ([place]) - Primary    Discussed northera use.       Monoclonal gammopathy of undetermined significance    Appreciate heme care. Established today - pending rpt labwork. Patient did not have any questions regarding today's apponitment.       MCI (mild cognitive impairment) with memory loss    MMSE today 23/30.  Reviewed neuropsychological  testing from 03/2015- mild unspecified mental disorder thought due to episodes of hypotension. At that time there were no signs of neurodegenerative disorder like alzheimer's disease. Discussed noted memory issues could still be related to his known neurogenic orthostatic hypotension and thus would not benefit from medication such as acetylcholinesterase inhibitor. Offered neurology referral or repeat neuropsychological evaluation - he is not interested in further testing at this time. Will continue to monitor for now, work towards BP control and continue to avoid hypotension.           No orders of the defined types were placed in this encounter.  No orders of the defined types were placed in this encounter.   Patient Instructions  Memory testing shows ongoing memory trouble. We think this may come from low blood pressure episodes and is why we are on Northera. If worsening memory trouble noted, we could refer you back to neurology for another opinion.    Follow up plan: Return if symptoms worsen or fail to improve.  Ria Bush, MD

## 2019-08-24 NOTE — Assessment & Plan Note (Signed)
Discussed northera use.

## 2019-08-24 NOTE — Assessment & Plan Note (Addendum)
MMSE today 23/30. Reviewed neuropsychological testing from 03/2015- mild unspecified mental disorder thought due to episodes of hypotension. At that time there were no signs of neurodegenerative disorder like alzheimer's disease. Discussed noted memory issues could still be related to his known neurogenic orthostatic hypotension and thus would not benefit from medication such as acetylcholinesterase inhibitor. Offered neurology referral or repeat neuropsychological evaluation - he is not interested in further testing at this time. Will continue to monitor for now, work towards BP control and continue to avoid hypotension.

## 2019-08-25 LAB — KAPPA/LAMBDA LIGHT CHAINS
Kappa free light chain: 17.9 mg/L (ref 3.3–19.4)
Kappa, lambda light chain ratio: 0.69 (ref 0.26–1.65)
Lambda free light chains: 25.8 mg/L (ref 5.7–26.3)

## 2019-08-25 LAB — BETA 2 MICROGLOBULIN, SERUM: Beta-2 Microglobulin: 1.7 mg/L (ref 0.6–2.4)

## 2019-08-29 LAB — MULTIPLE MYELOMA PANEL, SERUM
Albumin SerPl Elph-Mcnc: 3.4 g/dL (ref 2.9–4.4)
Albumin/Glob SerPl: 1.5 (ref 0.7–1.7)
Alpha 1: 0.2 g/dL (ref 0.0–0.4)
Alpha2 Glob SerPl Elph-Mcnc: 0.6 g/dL (ref 0.4–1.0)
B-Globulin SerPl Elph-Mcnc: 1.1 g/dL (ref 0.7–1.3)
Gamma Glob SerPl Elph-Mcnc: 0.6 g/dL (ref 0.4–1.8)
Globulin, Total: 2.4 g/dL (ref 2.2–3.9)
IgA: 531 mg/dL — ABNORMAL HIGH (ref 61–437)
IgG (Immunoglobin G), Serum: 655 mg/dL (ref 603–1613)
IgM (Immunoglobulin M), Srm: 16 mg/dL (ref 15–143)
M Protein SerPl Elph-Mcnc: 0.5 g/dL — ABNORMAL HIGH
Total Protein ELP: 5.8 g/dL — ABNORMAL LOW (ref 6.0–8.5)

## 2019-08-30 DIAGNOSIS — I2510 Atherosclerotic heart disease of native coronary artery without angina pectoris: Secondary | ICD-10-CM | POA: Diagnosis not present

## 2019-08-30 DIAGNOSIS — D472 Monoclonal gammopathy: Secondary | ICD-10-CM | POA: Diagnosis not present

## 2019-08-30 DIAGNOSIS — Z87891 Personal history of nicotine dependence: Secondary | ICD-10-CM | POA: Diagnosis not present

## 2019-08-30 DIAGNOSIS — I119 Hypertensive heart disease without heart failure: Secondary | ICD-10-CM | POA: Diagnosis not present

## 2019-08-31 ENCOUNTER — Ambulatory Visit (HOSPITAL_COMMUNITY)
Admission: RE | Admit: 2019-08-31 | Discharge: 2019-08-31 | Disposition: A | Discharge status: Home / Self Care | Payer: Medicare HMO | Source: Ambulatory Visit | Attending: Hematology and Oncology | Admitting: Hematology and Oncology

## 2019-08-31 ENCOUNTER — Other Ambulatory Visit: Payer: Self-pay

## 2019-08-31 DIAGNOSIS — D472 Monoclonal gammopathy: Secondary | ICD-10-CM | POA: Insufficient documentation

## 2019-09-01 ENCOUNTER — Telehealth: Payer: Self-pay

## 2019-09-01 LAB — UPEP/UIFE/LIGHT CHAINS/TP, 24-HR UR
% BETA, Urine: 0 %
ALPHA 1 URINE: 0 %
Albumin, U: 100 %
Alpha 2, Urine: 0 %
Free Kappa Lt Chains,Ur: 7.08 mg/L (ref 0.63–113.79)
Free Kappa/Lambda Ratio: 2.84 (ref 1.03–31.76)
Free Lambda Lt Chains,Ur: 2.49 mg/L (ref 0.47–11.77)
GAMMA GLOBULIN URINE: 0 %
Total Protein, Urine-Ur/day: 76 mg/24 hr (ref 30–150)
Total Protein, Urine: 4 mg/dL

## 2019-09-01 NOTE — Telephone Encounter (Signed)
error 

## 2019-09-01 NOTE — Telephone Encounter (Signed)
TCT patient regarding x-ray and blood work, unable to reach. LVM to call clinic back.

## 2019-09-01 NOTE — Telephone Encounter (Signed)
-----  Message from Adam Slick, MD sent at 09/01/2019 10:53 AM EDT ----- Please call Adam Clements to let him know that: 1) His bone survey (x-ray) showed no signs of Multiple Myeloma in the bones 2) There is no multiple myeloma protein in the urine 3) His other blood work appears stable.   We will see him back in 6 months to reassess.  Thank you,  Dr. Lorenso Courier  ----- Message ----- From: Buel Ream, Lab In [place] Sent: 08/24/2019  10:58 AM EDT To: Adam Slick, MD

## 2019-09-02 DIAGNOSIS — Z85828 Personal history of other malignant neoplasm of skin: Secondary | ICD-10-CM | POA: Diagnosis not present

## 2019-09-02 DIAGNOSIS — L738 Other specified follicular disorders: Secondary | ICD-10-CM | POA: Diagnosis not present

## 2019-09-02 DIAGNOSIS — L570 Actinic keratosis: Secondary | ICD-10-CM | POA: Diagnosis not present

## 2019-09-02 DIAGNOSIS — L578 Other skin changes due to chronic exposure to nonionizing radiation: Secondary | ICD-10-CM | POA: Diagnosis not present

## 2019-09-02 DIAGNOSIS — D225 Melanocytic nevi of trunk: Secondary | ICD-10-CM | POA: Diagnosis not present

## 2019-09-02 DIAGNOSIS — D2261 Melanocytic nevi of right upper limb, including shoulder: Secondary | ICD-10-CM | POA: Diagnosis not present

## 2019-09-02 DIAGNOSIS — L821 Other seborrheic keratosis: Secondary | ICD-10-CM | POA: Diagnosis not present

## 2019-09-13 ENCOUNTER — Ambulatory Visit: Payer: Medicare HMO | Admitting: Internal Medicine

## 2019-09-13 ENCOUNTER — Encounter: Payer: Self-pay | Admitting: Internal Medicine

## 2019-09-13 ENCOUNTER — Other Ambulatory Visit: Payer: Self-pay

## 2019-09-13 VITALS — BP 168/80 | HR 60 | Ht 69.0 in | Wt 146.2 lb

## 2019-09-13 DIAGNOSIS — I428 Other cardiomyopathies: Secondary | ICD-10-CM | POA: Diagnosis not present

## 2019-09-13 DIAGNOSIS — I951 Orthostatic hypotension: Secondary | ICD-10-CM | POA: Diagnosis not present

## 2019-09-13 NOTE — Patient Instructions (Signed)
Medication Instructions:  Your physician recommends that you continue on your current medications as directed. Please refer to the Current Medication list given to you today.  *If you need a refill on your cardiac medications before your next appointment, please call your pharmacy*   Lab Work: None ordered.  If you have labs (blood work) drawn today and your tests are completely normal, you will receive your results only by: Marland Kitchen MyChart Message (if you have MyChart) OR . A paper copy in the mail If you have any lab test that is abnormal or we need to change your treatment, we will call you to review the results.   Testing/Procedures: None ordered.    Follow-Up: At [facility], you and your health needs are our priority.  As part of our continuing mission to provide you with exceptional heart care, we have created designated Provider Care Teams.  These Care Teams include your primary Cardiologist (physician) and Advanced Practice Providers (APPs -  Physician Assistants and Nurse Practitioners) who all work together to provide you with the care you need, when you need it.  We recommend signing up for the patient portal called "MyChart".  Sign up information is provided on this After Visit Summary.  MyChart is used to connect with patients for Virtual Visits (Telemedicine).  Patients are able to view lab/test results, encounter notes, upcoming appointments, etc.  Non-urgent messages can be sent to your provider as well.   To learn more about what you can do with MyChart, go to [email or web address].    Your next appointment:   5 month(s)  The format for your next appointment:   In Person  Provider:   Virl Axe, MD

## 2019-09-13 NOTE — Progress Notes (Signed)
ELECTROPHYSIOLOGY OFFICE NOTE  Patient ID: Adam Clements, MRN: [id number], DOB/AGE: November 26, 1934 85 y.o. Admit date: (Not on file) Date of Consult: 09/13/2019  Primary Physician: Ria Bush, MD Primary Cardiologist: [name] is a 85 y.o. male who is being seen today for the evaluation of orthostatic hypotension at the request of himself and wife.    HPI Adam Clements is a 85 y.o. male seen today because of profound orthostasis with lightheadedness and fatigue.  It is somewhat delayed occurring a few minutes after standing.  He has had syncope.    We have looked for evidence of amyloid but have found little>> borderline PYP ; myeloma panel>>M prot 0.3  FLC assay neg    IFE urine neg  Discussed with Dr Gaylan Gerold  He does have a history of hypertension. He does not have problems with bowel function.  He is having progressive problems with dry mouth.  No problems with bowel function and stable issues with bladder function.   He underwent testing at [facility] under the care of Dr. Fenton Malling; results were consistent with autonomic failure--probably a neurogenic orthostatic hypotension definitely.   On DAPT for TIA with expressive aphasia  Patient has noted continuing worsening exercise tolerance now   probably 25-30% weaker than he was 3-6 months ago.  He has a chair in the barn.  He has not had syncope.  He continues to struggle with urination but no constipation.      DATE TEST EF   6/17 MYOVIEW  36 %   4/17 Echo   45-50% LAE severe  10/19 HUT      Date Cr K Hgb  7/19 1.16  11.8  10/19 1.5  10.4  6/20 0.28 5.2 10.8        Past Medical History:  Diagnosis Date  . Allergic rhinitis   . Arthritis    Gout  . BPH (benign prostatic hypertrophy)   . Bradycardia   . CAD (coronary artery disease)    nonobstructive  . Chronic anemia 2013   h/o IDA, did not tolerate oral iron, latest normal iron studies, B12, folate  . Diverticulosis of colon     conoscopy 12/1993 (Dr. Amedeo Plenty) S/G divertics//colonoscopy L&R divertics (Dr. Amedeo Plenty) 11/08/2004  . Dizziness   . Gout   . HTN (hypertension)   . Memory loss   . Mitral regurgitation    Pulmonic regurgitation, with marked LA enlargement  . Nonischemic cardiomyopathy (HCC)    EF 40%  . Orthostatic hypotension   . PVC (premature ventricular contraction)   . PVD (peripheral vascular disease) ([place])    followed by Dr Trula Slade, treating medically (pletal and aspirin)  . RBBB longstanding  . Squamous cell carcinoma in situ of skin 2015   L ear  . Syncope       Surgical History:  Past Surgical History:  Procedure Laterality Date  . 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 APPENDECTOMY    . dexa  2004   "shrunk 2 inches", WNL    Current Meds  Medication Sig  . cholecalciferol (VITAMIN D) 1000 units tablet Take 1,000 Units by mouth daily.  Marland Kitchen dipyridamole-aspirin (AGGRENOX) 200-25 MG 12hr capsule TAKE 1 CAPSULE BY MOUTH TWICE A DAY  . Droxidopa (NORTHERA) 100 MG CAPS Take 2 capsules in the morning, 1 capsule at lunch, and 2 capsules in the late afternoon  . pravastatin (PRAVACHOL) 20 MG tablet TAKE 1 TABLET(20 MG)  BY MOUTH DAILY  . vitamin B-12 (CYANOCOBALAMIN) 500 MCG tablet Take 500 mcg by mouth daily.      Allergies:  Allergies  Allergen Reactions  . Atorvastatin Other (See Comments)    Memory trouble, confusion, fatigue  . Pletal [Cilostazol]      ROS:  Please see the history of present illness.     All other systems reviewed and negative.   Physical Exam: BP (!) 168/80   Pulse 60   Ht 5\' 9"  (1.753 m)   Wt 146 lb 3.2 oz (66.3 kg)   SpO2 98%   BMI 21.59 kg/m  Well developed and nourished in no acute distress HENT normal Neck supple   Clear Regular rate and rhythm, no murmurs or gallops Abd-soft with active BS No Clubbing cyanosis edema Skin-warm and dry A & Oriented  Grossly normal sensory and motor function  ECG sinus @ 60 18/16/45    Labs: Cardiac  Enzymes No results for input(s): CKTOTAL, CKMB, TROPONINI in the last 72 hours. CBC Lab Results  Component Value Date   WBC 4.2 08/24/2019   HGB 10.4 (L) 08/24/2019   HCT 30.4 (L) 08/24/2019   MCV 94.7 08/24/2019   PLT 204 08/24/2019   PROTIME: No results for input(s): LABPROT, INR in the last 72 hours. Chemistry    Assessment and Plan:   Hypertension  Orthostatic hypotension  Cardiomyopathy-nonischemic  Bilateral carotid stenosis  TIA  Confusion spells  Autonomic failure     The patient continues to have profound orthostatic hypotension with a drop in a 130 mm today with standing and a change in heart rate of about 8 bpm  He functions surprisingly well.  I still do not think that there is a different diagnosis from autonomic failure/Shy-Drager syndrome.  I reviewed with him what was available on UP TO DATE, the only other drug noted was Strattera  I will reach out to Dr. Gordy Savers.  Supine systolic hypertension is still striking.  Apart from raising the head of the bed I do not think we have good strategies for this.  We will try low-dose hydralazine at bedtime.  Discussed end-of-life issues.  It is my impression that his autonomic failure is inexorably progressing and at some point will be still more debilitating.  Further, in this setting, there will come a time perhaps when if he were to die he would prefer not to be resuscitated.  At this juncture this is not the case.      Virl Axe

## 2019-09-14 NOTE — Addendum Note (Signed)
Addended by: Rose Phi on: 09/14/2019 12:40 PM   Modules accepted: Orders

## 2019-09-26 ENCOUNTER — Other Ambulatory Visit: Payer: Self-pay | Admitting: Family Medicine

## 2019-09-26 DIAGNOSIS — E46 Unspecified protein-calorie malnutrition: Secondary | ICD-10-CM

## 2019-09-26 DIAGNOSIS — D649 Anemia, unspecified: Secondary | ICD-10-CM

## 2019-09-26 DIAGNOSIS — I739 Peripheral vascular disease, unspecified: Secondary | ICD-10-CM

## 2019-09-27 ENCOUNTER — Other Ambulatory Visit: Payer: Self-pay

## 2019-09-27 ENCOUNTER — Ambulatory Visit: Payer: Medicare HMO

## 2019-09-27 ENCOUNTER — Telehealth: Payer: Self-pay

## 2019-09-27 ENCOUNTER — Other Ambulatory Visit (INDEPENDENT_AMBULATORY_CARE_PROVIDER_SITE_OTHER): Payer: Medicare HMO

## 2019-09-27 DIAGNOSIS — E46 Unspecified protein-calorie malnutrition: Secondary | ICD-10-CM | POA: Diagnosis not present

## 2019-09-27 DIAGNOSIS — I739 Peripheral vascular disease, unspecified: Secondary | ICD-10-CM | POA: Diagnosis not present

## 2019-09-27 DIAGNOSIS — D649 Anemia, unspecified: Secondary | ICD-10-CM | POA: Diagnosis not present

## 2019-09-27 LAB — LIPID PANEL
Cholesterol: 138 mg/dL (ref 0–200)
HDL: 60.7 mg/dL (ref >39.00)
LDL Cholesterol: 72 mg/dL (ref 0–99)
NonHDL: 77.33
Total CHOL/HDL Ratio: 2
Triglycerides: 28 mg/dL (ref 0.0–149.0)
VLDL: 5.6 mg/dL (ref 0.0–40.0)

## 2019-09-27 LAB — COMPREHENSIVE METABOLIC PANEL
ALT: 9 U/L (ref 0–53)
AST: 13 U/L (ref 0–37)
Albumin: 3.7 g/dL (ref 3.5–5.2)
Alkaline Phosphatase: 45 U/L (ref 39–117)
BUN: 28 mg/dL — ABNORMAL HIGH (ref 6–23)
CO2: 25 mEq/L (ref 19–32)
Calcium: 8.7 mg/dL (ref 8.4–10.5)
Chloride: 100 mEq/L (ref 96–112)
Creatinine, Ser: 1.27 mg/dL (ref 0.40–1.50)
GFR: 54.11 mL/min — ABNORMAL LOW (ref >60.00)
Glucose, Bld: 90 mg/dL (ref 70–99)
Potassium: 4.6 mEq/L (ref 3.5–5.1)
Sodium: 132 mEq/L — ABNORMAL LOW (ref 135–145)
Total Bilirubin: 0.5 mg/dL (ref 0.2–1.2)
Total Protein: 5.9 g/dL — ABNORMAL LOW (ref 6.0–8.3)

## 2019-09-27 LAB — CBC WITH DIFFERENTIAL/PLATELET
Basophils Absolute: 0 10*3/uL (ref 0.0–0.1)
Basophils Relative: 0.8 % (ref 0.0–3.0)
Eosinophils Absolute: 0.2 10*3/uL (ref 0.0–0.7)
Eosinophils Relative: 3.4 % (ref 0.0–5.0)
HCT: 29.8 % — ABNORMAL LOW (ref 39.0–52.0)
Hemoglobin: 10.5 g/dL — ABNORMAL LOW (ref 13.0–17.0)
Lymphocytes Relative: 16.6 % (ref 12.0–46.0)
Lymphs Abs: 0.8 10*3/uL (ref 0.7–4.0)
MCHC: 35.1 g/dL (ref 30.0–36.0)
MCV: 95.2 fl (ref 78.0–100.0)
Monocytes Absolute: 0.6 10*3/uL (ref 0.1–1.0)
Monocytes Relative: 11.9 % (ref 3.0–12.0)
Neutro Abs: 3.1 10*3/uL (ref 1.4–7.7)
Neutrophils Relative %: 67.3 % (ref 43.0–77.0)
Platelets: 222 10*3/uL (ref 150.0–400.0)
RBC: 3.14 Mil/uL — ABNORMAL LOW (ref 4.22–5.81)
RDW: 12.7 % (ref 11.5–15.5)
WBC: 4.7 10*3/uL (ref 4.0–10.5)

## 2019-09-27 NOTE — Telephone Encounter (Signed)
FYI- Called patient to complete his Medicare visit. Patient is very hard of hearing and stated that he could not hear a word I was saying. He requested to complete this visit in the morning at his physical with the provider face to face. Appointment cancelled per patient request.

## 2019-09-28 ENCOUNTER — Other Ambulatory Visit: Payer: Self-pay

## 2019-09-28 ENCOUNTER — Ambulatory Visit (INDEPENDENT_AMBULATORY_CARE_PROVIDER_SITE_OTHER): Payer: Medicare HMO | Admitting: Family Medicine

## 2019-09-28 ENCOUNTER — Encounter: Payer: Self-pay | Admitting: Family Medicine

## 2019-09-28 VITALS — BP 166/78 | HR 65 | Temp 97.6°F | Ht 67.25 in | Wt 146.2 lb

## 2019-09-28 DIAGNOSIS — R361 Hematospermia: Secondary | ICD-10-CM

## 2019-09-28 DIAGNOSIS — I5020 Unspecified systolic (congestive) heart failure: Secondary | ICD-10-CM

## 2019-09-28 DIAGNOSIS — D472 Monoclonal gammopathy: Secondary | ICD-10-CM | POA: Diagnosis not present

## 2019-09-28 DIAGNOSIS — G459 Transient cerebral ischemic attack, unspecified: Secondary | ICD-10-CM

## 2019-09-28 DIAGNOSIS — I428 Other cardiomyopathies: Secondary | ICD-10-CM | POA: Diagnosis not present

## 2019-09-28 DIAGNOSIS — R5383 Other fatigue: Secondary | ICD-10-CM

## 2019-09-28 DIAGNOSIS — G903 Multi-system degeneration of the autonomic nervous system: Secondary | ICD-10-CM

## 2019-09-28 DIAGNOSIS — Z0000 Encounter for general adult medical examination without abnormal findings: Secondary | ICD-10-CM | POA: Diagnosis not present

## 2019-09-28 DIAGNOSIS — G3184 Mild cognitive impairment, so stated: Secondary | ICD-10-CM | POA: Diagnosis not present

## 2019-09-28 DIAGNOSIS — T50905A Adverse effect of unspecified drugs, medicaments and biological substances, initial encounter: Secondary | ICD-10-CM

## 2019-09-28 DIAGNOSIS — D649 Anemia, unspecified: Secondary | ICD-10-CM

## 2019-09-28 DIAGNOSIS — I739 Peripheral vascular disease, unspecified: Secondary | ICD-10-CM

## 2019-09-28 DIAGNOSIS — N400 Enlarged prostate without lower urinary tract symptoms: Secondary | ICD-10-CM

## 2019-09-28 LAB — POC URINALSYSI DIPSTICK (AUTOMATED)
Bilirubin, UA: NEGATIVE
Blood, UA: NEGATIVE
Glucose, UA: NEGATIVE
Ketones, UA: NEGATIVE
Leukocytes, UA: NEGATIVE
Nitrite, UA: NEGATIVE
Protein, UA: NEGATIVE
Spec Grav, UA: 1.02 (ref 1.010–1.025)
Urobilinogen, UA: 0.2 E.U./dL
pH, UA: 6 (ref 5.0–8.0)

## 2019-09-28 NOTE — Patient Instructions (Addendum)
Mood questionairre today - overall ok.  Consider medication that may help mood some as well as energy and concentration (wellbutrin).  We could consider return to neurology.  Continue to stay well hydrated, work on nutrition.  For blood in ejaculate - we can monitor for now. Let me know if recurrent. Urinalysis today looked ok.  Return in 3 months for follow up visit.   Health Maintenance After Age 83 After age 82, you are at a higher risk for certain long-term diseases and infections as well as injuries from falls. Falls are a major cause of broken bones and head injuries in people who are older than age 27. Getting regular preventive care can help to keep you healthy and well. Preventive care includes getting regular testing and making lifestyle changes as recommended by your health care provider. Talk with your health care provider about:  Which screenings and tests you should have. A screening is a test that checks for a disease when you have no symptoms.  A diet and exercise plan that is right for you. What should I know about screenings and tests to prevent falls? Screening and testing are the best ways to find a health problem early. Early diagnosis and treatment give you the best chance of managing medical conditions that are common after age 63. Certain conditions and lifestyle choices may make you more likely to have a fall. Your health care provider may recommend:  Regular vision checks. Poor vision and conditions such as cataracts can make you more likely to have a fall. If you wear glasses, make sure to get your prescription updated if your vision changes.  Medicine review. Work with your health care provider to regularly review all of the medicines you are taking, including over-the-counter medicines. Ask your health care provider about any side effects that may make you more likely to have a fall. Tell your health care provider if any medicines that you take make you feel dizzy or  sleepy.  Osteoporosis screening. Osteoporosis is a condition that causes the bones to get weaker. This can make the bones weak and cause them to break more easily.  Blood pressure screening. Blood pressure changes and medicines to control blood pressure can make you feel dizzy.  Strength and balance checks. Your health care provider may recommend certain tests to check your strength and balance while standing, walking, or changing positions.  Foot health exam. Foot pain and numbness, as well as not wearing proper footwear, can make you more likely to have a fall.  Depression screening. You may be more likely to have a fall if you have a fear of falling, feel emotionally low, or feel unable to do activities that you used to do.  Alcohol use screening. Using too much alcohol can affect your balance and may make you more likely to have a fall. What actions can I take to lower my risk of falls? General instructions  Talk with your health care provider about your risks for falling. Tell your health care provider if: ? You fall. Be sure to tell your health care provider about all falls, even ones that seem minor. ? You feel dizzy, sleepy, or off-balance.  Take over-the-counter and prescription medicines only as told by your health care provider. These include any supplements.  Eat a healthy diet and maintain a healthy weight. A healthy diet includes low-fat dairy products, low-fat (lean) meats, and fiber from whole grains, beans, and lots of fruits and vegetables. Home safety  Remove  any tripping hazards, such as rugs, cords, and clutter.  Install safety equipment such as grab bars in bathrooms and safety rails on stairs.  Keep rooms and walkways well-lit. Activity   Follow a regular exercise program to stay fit. This will help you maintain your balance. Ask your health care provider what types of exercise are appropriate for you.  If you need a cane or walker, use it as recommended by  your health care provider.  Wear supportive shoes that have nonskid soles. Lifestyle  Do not drink alcohol if your health care provider tells you not to drink.  If you drink alcohol, limit how much you have: ? 0-1 drink a day for women. ? 0-2 drinks a day for men.  Be aware of how much alcohol is in your drink. In the [place] one drink equals one typical bottle of beer (12 oz), one-half glass of wine (5 oz), or one shot of hard liquor (1 oz).  Do not use any products that contain nicotine or tobacco, such as cigarettes and e-cigarettes. If you need help quitting, ask your health care provider. Summary  Having a healthy lifestyle and getting preventive care can help to protect your health and wellness after age 45.  Screening and testing are the best way to find a health problem early and help you avoid having a fall. Early diagnosis and treatment give you the best chance for managing medical conditions that are more common for people who are older than age 31.  Falls are a major cause of broken bones and head injuries in people who are older than age 47. Take precautions to prevent a fall at home.  Work with your health care provider to learn what changes you can make to improve your health and wellness and to prevent falls. This information is not intended to replace advice given to you by your health care provider. Make sure you discuss any questions you have with your health care provider. Document Revised: 07/01/2018 Document Reviewed: 01/21/2017 Elsevier Patient Education  2020 [employer].

## 2019-09-28 NOTE — Progress Notes (Signed)
This visit was conducted in person.  BP (!) 166/78 (BP Location: Right Arm, Patient Position: Sitting, Cuff Size: Normal)   Pulse 65   Temp 97.6 F (36.4 C) (Temporal)   Ht 5' 7.25" (1.708 m)   Wt 146 lb 4 oz (66.3 kg)   SpO2 98%   BMI 22.74 kg/m   BP Readings from Last 3 Encounters:  09/28/19 (!) 166/78  09/13/19 (!) 168/80  08/24/19 (!) 144/76    CC: CPE/AMW Subjective:    Patient ID: Adam Clements, male    DOB: 09-Aug-1934, 85 y.o.   MRN: [id number]  HPI: Adam Clements is a 85 y.o. male presenting on 09/28/2019 for Annual Exam (Prt 2. )   Did not speak with health advisor this year.    Hearing Screening   125Hz  250Hz  500Hz  1000Hz  2000Hz  3000Hz  4000Hz  6000Hz  8000Hz   Right ear:           Left ear:           Comments: Wears bilateral hearing aids.   Visual Acuity Screening   Right eye Left eye Both eyes  Without correction: 20/30 20/30 20/25   With correction:         Office Visit from 09/28/2019 in [place] at [facility] Total Score 0     Fall Risk  09/22/2018 09/16/2017 01/05/2017 09/11/2016 04/07/2016  Falls in the past year? 1 No No No No  Number falls in past yr: 0 - - - -  Injury with Fall? 0 - - - -  Follow up - - - - -   Known marked neurogenic orthostatic hypotension managed with northera. Continues feeling very weak. Wears compression stockings. Hasn't recently been using abdominal binder but has at home.  Recent MMSE 23/30, 25/30 with cues. CDT 4/4.  He declined further evaluation (return to neuro vs rpt neuropsychological testing). Previously had reassuring evaluation.   Episode yesterday of ejaculate described as dark brown - like dry blood. No pain, itching, dysuria. No increased urinary frequency or urgency. Notes evening frequency, not much during the day. No fevers/chills, abd pain or nausea.   Preventative: Colonoscopy - 2006 by Dr. Amedeo Plenty - R and L sided diverticulosis. Not interested in repeat at this time.Decided to age out.Saw  GI 2015 2/2 weight loss and IDA- decided to monitor with stool kits, has not done one recently. Slow release iron caused stomach upset. Denies blood in stool.  Prostate screening - normal PSA in the past. H/o BPH. Age out.  DEXA 2004 WNL.  Flu shot - yearly  Pneumovax 2004, prevnar 2015 COVID vaccine - completed [place] 05/2019 Td 2012  zostavax 2009 shingrix -11/2016, 09/2017 Advanced directives: has living will at home -done through attorney. Wants HCPOA to be wife then son and then oldest daughter etc. Advanced directives: Done through attorney. Advanced directives in chart (01/2016). If terminal condition, does not want artificial nutrition/hydration or prolonged life support. HCPOA form scanned 02/20/2016 - wife [name] then children [name], [name], [name] are [place].  Seat belt use discussed.  Sunscreen use discussed. No changing moles on skin. Sees derm regularly (Dr Derrel Nip).  Non smoker Alcohol - stopped alcohol - quit 2020 - rare use  Dentist - doesn't see - has full dentures  Eye exam - yearly  Bowel - no constipation  Bladder - no incontinence   Lives with wife [name]).  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 Interim medical history since our last visit reviewed. Allergies and medications reviewed and updated. Outpatient Medications Prior to Visit  Medication Sig Dispense Refill  . cholecalciferol (VITAMIN D) 1000 units tablet Take 1,000 Units by mouth daily.    Marland Kitchen dipyridamole-aspirin (AGGRENOX) 200-25 MG 12hr capsule TAKE 1 CAPSULE BY MOUTH TWICE A DAY 60 capsule 11  . Droxidopa (NORTHERA) 100 MG CAPS Take 2 capsules in the morning, 1 capsule at lunch, and 2 capsules in the late afternoon 150  capsule 11  . pravastatin (PRAVACHOL) 20 MG tablet TAKE 1 TABLET(20 MG) BY MOUTH DAILY 90 tablet 3  . vitamin B-12 (CYANOCOBALAMIN) 500 MCG tablet Take 500 mcg by mouth daily.     No facility-administered medications prior to visit.     Per HPI unless specifically indicated in ROS section below Review of Systems  Constitutional: Negative for activity change, appetite change, chills, fatigue (weakness), fever and unexpected weight change.  HENT: Negative for hearing loss.   Eyes: Negative for visual disturbance.  Respiratory: Negative for cough, chest tightness, shortness of breath and wheezing.   Cardiovascular: Negative for chest pain, palpitations and leg swelling.  Gastrointestinal: Negative for abdominal distention, abdominal pain, blood in stool, constipation, diarrhea, nausea and vomiting.  Genitourinary: Negative for difficulty urinating and hematuria.  Musculoskeletal: Negative for arthralgias, myalgias and neck pain.  Skin: Negative for rash.  Neurological: Positive for dizziness and syncope. Negative for seizures and headaches.  Hematological: Negative for adenopathy. Does not bruise/bleed easily.  Psychiatric/Behavioral: Negative for dysphoric mood. The patient is not nervous/anxious.    Objective:  BP (!) 166/78 (BP Location: Right Arm, Patient Position: Sitting, Cuff Size: Normal)   Pulse 65   Temp 97.6 F (36.4 C) (Temporal)   Ht 5' 7.25" (1.708 m)   Wt 146 lb 4 oz (66.3 kg)   SpO2 98%   BMI 22.74 kg/m   Wt Readings from Last 3 Encounters:  09/28/19 146 lb 4 oz (66.3 kg)  09/13/19 146 lb 3.2 oz (66.3 kg)  08/24/19 149 lb 3 oz (67.7 kg)      Physical Exam Vitals and nursing note reviewed.  Constitutional:      General: He is not in acute distress.    Appearance: Normal appearance. He is well-developed and underweight. He is not ill-appearing.  HENT:     Right Ear: Decreased hearing noted.     Left Ear: Decreased hearing noted.     Ears:     Comments: Wears  hearing aides Eyes:     General: No scleral icterus.    Extraocular Movements: Extraocular movements intact.     Conjunctiva/sclera: Conjunctivae normal.     Pupils: Pupils are equal, round, and reactive to light.  Neck:     Thyroid: No thyromegaly or thyroid tenderness.  Cardiovascular:     Rate and Rhythm: Normal rate and regular rhythm.     Pulses: Normal pulses.          Radial pulses are 2+ on the right side and 2+ on the left side.     Heart sounds: Normal heart sounds. No murmur heard.   Pulmonary:     Effort: Pulmonary effort is normal. No respiratory distress.     Breath sounds: Normal breath sounds. No wheezing, rhonchi or rales.  Abdominal:     General: Abdomen is  flat. Bowel sounds are normal. There is no distension.     Palpations: Abdomen is soft. There is no mass.     Tenderness: There is no abdominal tenderness. There is no guarding or rebound.     Hernia: No hernia is present.  Musculoskeletal:        General: Normal range of motion.     Cervical back: Normal range of motion and neck supple.     Right lower leg: No edema.     Left lower leg: No edema.  Lymphadenopathy:     Cervical: No cervical adenopathy.  Skin:    General: Skin is warm and dry.     Findings: No rash.  Neurological:     General: No focal deficit present.     Mental Status: He is alert and oriented to person, place, and time.     Comments: CN grossly intact, station and gait intact  Psychiatric:        Mood and Affect: Mood normal.        Behavior: Behavior normal.        Thought Content: Thought content normal.        Judgment: Judgment normal.       Results for orders placed or performed in visit on 09/28/19  POCT Urinalysis Dipstick (Automated)  Result Value Ref Range   Color, UA yellow    Clarity, UA clear    Glucose, UA Negative Negative   Bilirubin, UA negative    Ketones, UA negative    Spec Grav, UA 1.020 1.010 - 1.025   Blood, UA negative    pH, UA 6.0 5.0 - 8.0    Protein, UA Negative Negative   Urobilinogen, UA 0.2 0.2 or 1.0 E.U./dL   Nitrite, UA negative    Leukocytes, UA Negative Negative   Depression screen [facility] 2/9 09/28/2019 09/22/2018 09/16/2017 09/11/2016 08/21/2015  Decreased Interest 0 0 0 0 0  Down, Depressed, Hopeless 0 0 0 0 0  PHQ - 2 Score 0 0 0 0 0  Altered sleeping 0 - 0 - -  Tired, decreased energy 3 - 0 - -  Change in appetite 0 - 0 - -  Feeling bad or failure about yourself  0 - 0 - -  Trouble concentrating 3 - 0 - -  Moving slowly or fidgety/restless 0 - 0 - -  Suicidal thoughts 0 - 0 - -  PHQ-9 Score 6 - 0 - -  Difficult doing work/chores - - Not difficult at all - -  Some recent data might be hidden    GAD 7 : Generalized Anxiety Score 09/28/2019  Nervous, Anxious, on Edge 0  Control/stop worrying 0  Worry too much - different things 0  Trouble relaxing 0  Restless 0  Easily annoyed or irritable 3  Afraid - awful might happen 0  Total GAD 7 Score 3   Assessment & Plan:  This visit occurred during the SARS-CoV-2 public health emergency.  Safety protocols were in place, including screening questions prior to the visit, additional usage of staff PPE, and extensive cleaning of exam room while observing appropriate contact time as indicated for disinfecting solutions.   Problem List Items Addressed This Visit    TIA (transient ischemic attack)    Continue aggrenox, statin.       PAD (peripheral artery disease) (HCC)    Continue aggrenox, pravastatin.       Normocytic anemia    Chronic, stable. Continue to monitor. Baseline seems  to be Hgb 10-11       NICM (nonischemic cardiomyopathy) (HCC)   Neurogenic orthostatic hypotension (HCC)    Marked drop with standing but with supine hypertension. Continue northera.  Reviewed progressive disease process without cure.  I encouraged he bring in wife to future visits to review expected trajectory.       Monoclonal gammopathy of undetermined significance    Noted IgA lambda  MGUS s/p reassuring heme eval to date. Continue to monitor.       Medicare annual wellness visit, subsequent - Primary    I have personally reviewed the Medicare Annual Wellness questionnaire and have noted 1. The patient's medical and social history 2. Their use of alcohol, tobacco or illicit drugs 3. Their current medications and supplements 4. The patient's functional ability including ADL's, fall risks, home safety risks and hearing or visual impairment. Cognitive function has been assessed and addressed as indicated.  5. Diet and physical activity 6. Evidence for depression or mood disorders The patients weight, height, BMI have been recorded in the chart. I have made referrals, counseling and provided education to the patient based on review of the above and I have provided the pt with a written personalized care plan for preventive services. Provider list updated.. See scanned questionairre as needed for further documentation. Reviewed preventative protocols and updated unless pt declined.       MCI (mild cognitive impairment) with memory loss    Declines return to neurology or neuropsychological re evaluation.  I encouraged he bring wife to appointments.       Hypertension due to drug    northera causing supine hypertension but needed for orthostatic hypotension      HFrEF (heart failure with reduced ejection fraction) (HCC)    Seems euvolemic.       Hematospermia    Newly noted.  UA normal. Reassurance provided. To let me know if recurrent issue to consider uro eval. He is on aggrenox.       Relevant Orders   POCT Urinalysis Dipstick (Automated) (Completed)   Health maintenance examination    Preventative protocols reviewed and updated unless pt declined. Discussed healthy diet and lifestyle.       Decreased stamina    Remains predominant concern  Anticipate due to neurogenic orthostasis.  Discussed possible trial of wellbutrin for energy and mood - declines at  this time.       BPH (benign prostatic hyperplasia)    Aged out of PSA screening. Denies urinary trouble.          No orders of the defined types were placed in this encounter.  Orders Placed This Encounter  Procedures  . POCT Urinalysis Dipstick (Automated)    Patient instructions: Mood questionairre today - overall ok.  Consider medication that may help mood some as well as energy and concentration (wellbutrin).  We could consider return to neurology.  Continue to stay well hydrated, work on nutrition.  For blood in ejaculate - we can monitor for now. Let me know if recurrent. Urinalysis today looked ok.  Return in 3 months for follow up visit.    Follow up plan: Return in about 3 months (around 12/29/2019) for follow up visit.  Ria Bush, MD

## 2019-10-01 ENCOUNTER — Encounter: Payer: Self-pay | Admitting: Family Medicine

## 2019-10-01 DIAGNOSIS — R361 Hematospermia: Secondary | ICD-10-CM | POA: Insufficient documentation

## 2019-10-01 DIAGNOSIS — Z7189 Other specified counseling: Secondary | ICD-10-CM | POA: Insufficient documentation

## 2019-10-01 NOTE — Assessment & Plan Note (Signed)
Continue aggrenox, statin.

## 2019-10-01 NOTE — Assessment & Plan Note (Addendum)
Newly noted.  UA normal. Reassurance provided. To let me know if recurrent issue to consider uro eval. He is on aggrenox.

## 2019-10-01 NOTE — Assessment & Plan Note (Signed)
Chronic, stable. Continue to monitor. Baseline seems to be Hgb 10-11

## 2019-10-01 NOTE — Assessment & Plan Note (Signed)
Preventative protocols reviewed and updated unless pt declined. Discussed healthy diet and lifestyle.  

## 2019-10-01 NOTE — Assessment & Plan Note (Signed)

## 2019-10-01 NOTE — Assessment & Plan Note (Addendum)
northera causing supine hypertension but needed for orthostatic hypotension

## 2019-10-01 NOTE — Assessment & Plan Note (Signed)
Continue aggrenox, pravastatin.

## 2019-10-01 NOTE — Assessment & Plan Note (Addendum)
Aged out of PSA screening. Denies urinary trouble.

## 2019-10-01 NOTE — Assessment & Plan Note (Addendum)
Remains predominant concern  Anticipate due to neurogenic orthostasis.  Discussed possible trial of wellbutrin for energy and mood - declines at this time.

## 2019-10-01 NOTE — Assessment & Plan Note (Addendum)
Declines return to neurology or neuropsychological re evaluation.  I encouraged he bring wife to appointments.

## 2019-10-01 NOTE — Assessment & Plan Note (Signed)
Marked drop with standing but with supine hypertension. Continue northera.  Reviewed progressive disease process without cure.  I encouraged he bring in wife to future visits to review expected trajectory.

## 2019-10-01 NOTE — Assessment & Plan Note (Signed)
Noted IgA lambda MGUS s/p reassuring heme eval to date. Continue to monitor.

## 2019-10-01 NOTE — Assessment & Plan Note (Signed)
Seems euvolemic.  

## 2019-10-04 ENCOUNTER — Telehealth: Payer: Self-pay

## 2019-10-04 MED ORDER — ATOMOXETINE HCL 10 MG PO CAPS: ORAL_CAPSULE | ORAL | 3 refills | Status: DC

## 2019-10-04 MED ORDER — ATOMOXETINE HCL 10 MG PO CAPS: 20.0000 mg | ORAL_CAPSULE | Freq: Two times a day (BID) | ORAL | 2 refills | Status: DC

## 2019-10-04 NOTE — Telephone Encounter (Signed)
Spoke with pt and advised per Dr Caryl Comes begin taking Strattera 20mg  (2caps) by mouth in the am x 7 days then increase to 20mg  (2 caps) twice a day.  Pt verbalizes understanding and agrees with current plan.

## 2019-10-10 ENCOUNTER — Telehealth: Payer: Self-pay | Admitting: Internal Medicine

## 2019-10-10 ENCOUNTER — Telehealth: Payer: Self-pay | Admitting: *Deleted

## 2019-10-10 NOTE — Telephone Encounter (Signed)
Received another message pt was returning my call.  Called pt back - NA.  Did not leave another message.

## 2019-10-10 NOTE — Telephone Encounter (Signed)
Pt prefers to be contacted at [phone]  Pt reports today her took his medication about 7 am BP were taken at 7:29, 7:35 am and 10:20 and 10:30 am today.  He has not taken it since. Presently pt is still taking Stratera 10 mg capsule (2) once in the AM.  He is to increase this to twice a day after 7 days. Advised pt these BPs are very elevated.  He feels as though they are accurate.  He has been feeling lightheadedness today which was also present when he was taking Northera.   Aware I will review with Dr Caryl Comes and contact him with further instructions.

## 2019-10-10 NOTE — Telephone Encounter (Signed)
Patient is returning call.  °

## 2019-10-10 NOTE — Telephone Encounter (Signed)
Please see previous telephone note for documentation 

## 2019-10-10 NOTE — Telephone Encounter (Signed)
Dr. Caryl Comes,    I started  Strattera  Wed. July 14 at  two , 10MG  capsules per day.   I remain light headed as before.   I took my blood pressure  today with the following results:         7:29am     sitting   159/106    standing   204/127        7:35am     sitting    167/124    standing   195/139        10:20                          193/116                         203/118         10:30                         164/104                         183/110 Not sure what to make of this. Adam Clements

## 2019-10-10 NOTE — Telephone Encounter (Signed)
Reviewed information with Dr Olin Pia who orders for pt to come into the office tomorrow for orthostatic blood pressures.  Please contact Dr Caryl Comes in [place] office with results.

## 2019-10-10 NOTE — Telephone Encounter (Signed)
NA - lm to c/b to discuss BPs and medication.

## 2019-10-10 NOTE — Telephone Encounter (Signed)
I have pt returning nurse's phone call. Please call back

## 2019-10-10 NOTE — Telephone Encounter (Signed)
attempted to contact pt by phone to discuss the above information.  Received a VM but what unsure of the names on the message.  These BPs were included in a pt advise request.  Will attempt to respond there.

## 2019-10-11 ENCOUNTER — Other Ambulatory Visit: Payer: Self-pay

## 2019-10-11 ENCOUNTER — Ambulatory Visit (INDEPENDENT_AMBULATORY_CARE_PROVIDER_SITE_OTHER): Payer: Medicare HMO

## 2019-10-11 ENCOUNTER — Other Ambulatory Visit: Payer: Self-pay | Admitting: Family Medicine

## 2019-10-11 VITALS — Wt 149.6 lb

## 2019-10-11 DIAGNOSIS — I951 Orthostatic hypotension: Secondary | ICD-10-CM

## 2019-10-11 NOTE — Progress Notes (Signed)
Pt in for Orthostatic Blood Pressures today at the request of Dr Caryl Comes.  Pt was started on Strattera 20mg  by mouth in the am x 7 days and then increase to 20mg  by mouth bid 10/12/2019.  Pt has stopped Northera.  Pt states he has been tolerating medication without problems.  Pt states he did coincidentally develop a sore throat, congestion and hoarseness of his voice on the same day he started the medication which continues.  Pt denies fever or chills.  Temp today is normal.  Pt states his lightheadedness is no better or worse on the Strattera.  Orthostatic B/P's and heart rates reviewed by Dr Caryl Comes who states readings are "actually good for this pt."  Dr Caryl Comes will research increasing Strattera and determine if this is needed.  Pt encouraged to contact his PCP for sore throat, congestion and hoarseness.

## 2019-10-11 NOTE — Telephone Encounter (Signed)
Pt came into office 10/11/2019 with orthostatics.  Please see that encounter for further information.

## 2019-10-12 ENCOUNTER — Other Ambulatory Visit: Payer: Self-pay | Admitting: Family Medicine

## 2019-10-12 NOTE — Telephone Encounter (Signed)
Aggrenox Last filled:  08/26/19, #60 Last OV:  09/28/19, AWV Next OV:  01/05/20, 3 mo f/u

## 2019-10-17 ENCOUNTER — Other Ambulatory Visit: Payer: Self-pay | Admitting: Family Medicine

## 2019-10-28 ENCOUNTER — Telehealth: Payer: Self-pay

## 2019-10-28 NOTE — Telephone Encounter (Signed)
Will see then. 

## 2019-10-28 NOTE — Patient Instructions (Signed)
Medication Instructions:  Your physician recommends that you continue on your current medications as directed. Please refer to the Current Medication list given to you today.  *If you need a refill on your cardiac medications before your next appointment, please call your pharmacy*   Lab Work: None ordered.  If you have labs (blood work) drawn today and your tests are completely normal, you will receive your results only by: . MyChart Message (if you have MyChart) OR . A paper copy in the mail If you have any lab test that is abnormal or we need to change your treatment, we will call you to review the results.   Testing/Procedures: None ordered.    Follow-Up: At [facility], you and your health needs are our priority.  As part of our continuing mission to provide you with exceptional heart care, we have created designated Provider Care Teams.  These Care Teams include your primary Cardiologist (physician) and Advanced Practice Providers (APPs -  Physician Assistants and Nurse Practitioners) who all work together to provide you with the care you need, when you need it.  We recommend signing up for the patient portal called "MyChart".  Sign up information is provided on this After Visit Summary.  MyChart is used to connect with patients for Virtual Visits (Telemedicine).  Patients are able to view lab/test results, encounter notes, upcoming appointments, etc.  Non-urgent messages can be sent to your provider as well.   To learn more about what you can do with MyChart, go to [email or web address].    Your next appointment:   As scheduled  

## 2019-10-28 NOTE — Telephone Encounter (Signed)
I spoke with pt; pt said his lightheadedness and no energy had not gotten worse but preventing pt from doing his daily chores. Pt had annual 09/28/19; pt had card appt on 10/11/19; for the last wk x 2 and last time being 10/27/19 pt has had regurgitation of stomach acid with chest discomfort(not CP) that usually last about 1 hr and then goes away. Pt said present regimen almost seems to be helping but then pt has more problems. 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 I asked pt if someone could take care of his horses this weekend and let pt rest and pt said no he has to take care of his horses. Pt is not having H/A or SOB or CP. Pt has already scheduled a 15' appt on 10/31/19 with [name]. I spoke with [name] and OK to keep 64' appt on 10/31/19; changed appt time to 10/31/19 at 10 AM. Pt has no covid symptoms, no travel and no known exposure to + covid. Pt had pfizer covid vaccines on 05/15/19 and 06/07/19. UC & ED precautions given and pt voiced understanding but pt plans to see [name] on 10/31/19 at 10 AM.

## 2019-10-31 ENCOUNTER — Other Ambulatory Visit: Payer: Self-pay

## 2019-10-31 ENCOUNTER — Encounter: Payer: Self-pay | Admitting: Family Medicine

## 2019-10-31 ENCOUNTER — Ambulatory Visit: Payer: Medicare HMO | Admitting: Family Medicine

## 2019-10-31 ENCOUNTER — Ambulatory Visit (INDEPENDENT_AMBULATORY_CARE_PROVIDER_SITE_OTHER): Payer: Medicare HMO | Admitting: Family Medicine

## 2019-10-31 VITALS — BP 134/70 | HR 69 | Temp 97.5°F | Ht 67.25 in | Wt 145.4 lb

## 2019-10-31 DIAGNOSIS — R339 Retention of urine, unspecified: Secondary | ICD-10-CM

## 2019-10-31 DIAGNOSIS — R5383 Other fatigue: Secondary | ICD-10-CM

## 2019-10-31 DIAGNOSIS — G903 Multi-system degeneration of the autonomic nervous system: Secondary | ICD-10-CM

## 2019-10-31 LAB — POC URINALSYSI DIPSTICK (AUTOMATED)
Bilirubin, UA: NEGATIVE
Blood, UA: NEGATIVE
Glucose, UA: NEGATIVE
Ketones, UA: NEGATIVE
Leukocytes, UA: NEGATIVE
Nitrite, UA: NEGATIVE
Protein, UA: NEGATIVE
Spec Grav, UA: >=1.03 — AB
Urobilinogen, UA: 0.2 U/dL
pH, UA: 5.5

## 2019-10-31 NOTE — Progress Notes (Signed)
This visit was conducted in person.  BP 134/70 (BP Location: Left Arm, Patient Position: Sitting, Cuff Size: Normal)   Pulse 69   Temp (!) 97.5 F (36.4 C) (Temporal)   Ht 5' 7.25" (1.708 m)   Wt 145 lb 7 oz (66 kg)   SpO2 96%   BMI 22.61 kg/m   Orthostatic VS for the past 24 hrs (Last 3 readings):  BP- Lying BP- Standing at 0 minutes BP- Standing at 3 minutes  10/31/19 1051 -- -- 98/60  10/31/19 1017 -- 98/74 --  10/31/19 1015 170/82 -- --    CC: dizziness  Subjective:    Patient ID: Adam Clements, male    DOB: March 18, 1935, 85 y.o.   MRN: [id number]  HPI: Adam Clements is a 85 y.o. male presenting on 10/31/2019 for Dizziness (C/o feeling lightheaded and having no energy.  Sxs started about 1 mo ago. )   Known marked neurogenic orthostatic hypotension previously managed with northera - however he stopped this last month and changed to strattera 20mg  daily per Dr Caryl Comes, currently on 40mg  daily. He wears compression stockings. Changed med regimen as northera became unaffordable (<$1000/month). Looking into increasing strattera dose. Hasn't noticed significant difference between medications.   Here today because has ongoing difficulty coming to terms with diargosis and effect on functions - remains unsteady on his feet, low stamina (has to stop any level of activity after 5-10 mins to rest), daytime somnolence with increasing naps.  Noticing increased difficulty urinating with incomplete emptying - noted since starting strattera.  Noticing increasing heartburn and dry mouth and anorexia - since starting strattera - this is now better.  No headaches, insomnia, hyperhidrosis.   Considering stopping doing household finances due to slowing mentation.      Relevant past medical, surgical, family and social history reviewed and updated as indicated. Interim medical history since our last visit reviewed. Allergies and medications reviewed and updated. Outpatient Medications Prior to  Visit  Medication Sig Dispense Refill  . atomoxetine (STRATTERA) 10 MG capsule Take 2 capsules (20 mg total) by mouth 2 (two) times daily with a meal. Take 20mg  (2 caps) by mouth in the am x 7 days then begin 20mg  (2caps) by mouth twice daily. 120 capsule 2  . cholecalciferol (VITAMIN D) 1000 units tablet Take 1,000 Units by mouth daily.    Marland Kitchen dipyridamole-aspirin (AGGRENOX) 200-25 MG 12hr capsule TAKE 1 CAPSULE BY MOUTH TWICE A DAY 60 capsule 11  . pravastatin (PRAVACHOL) 20 MG tablet TAKE 1 TABLET(20 MG) BY MOUTH DAILY 90 tablet 3  . vitamin B-12 (CYANOCOBALAMIN) 500 MCG tablet Take 500 mcg by mouth daily.     No facility-administered medications prior to visit.     Per HPI unless specifically indicated in ROS section below Review of Systems Objective:  BP 134/70 (BP Location: Left Arm, Patient Position: Sitting, Cuff Size: Normal)   Pulse 69   Temp (!) 97.5 F (36.4 C) (Temporal)   Ht 5' 7.25" (1.708 m)   Wt 145 lb 7 oz (66 kg)   SpO2 96%   BMI 22.61 kg/m   Wt Readings from Last 3 Encounters:  10/31/19 145 lb 7 oz (66 kg)  10/11/19 149 lb 9.6 oz (67.9 kg)  09/28/19 146 lb 4 oz (66.3 kg)      Physical Exam Vitals and nursing note reviewed.  Constitutional:      Appearance: Normal appearance. He is not ill-appearing.  Cardiovascular:     Rate and Rhythm:  Normal rate and regular rhythm.     Pulses: Normal pulses.     Heart sounds: Normal heart sounds. No murmur heard.   Pulmonary:     Effort: Pulmonary effort is normal. No respiratory distress.     Breath sounds: Normal breath sounds. No wheezing, rhonchi or rales.  Musculoskeletal:        General: Normal range of motion.     Comments: Compression stockings in place  Skin:    General: Skin is warm and dry.     Findings: No rash.  Neurological:     Mental Status: He is alert.  Psychiatric:        Mood and Affect: Mood normal.        Behavior: Behavior normal.       Results for orders placed or performed in visit  on 10/31/19  POCT Urinalysis Dipstick (Automated)  Result Value Ref Range   Color, UA yellow    Clarity, UA clear    Glucose, UA Negative Negative   Bilirubin, UA negative    Ketones, UA negative    Spec Grav, UA >=1.030 (A) 1.010 - 1.025   Blood, UA negative    pH, UA 5.5 5.0 - 8.0   Protein, UA Negative Negative   Urobilinogen, UA 0.2 0.2 or 1.0 E.U./dL   Nitrite, UA negative    Leukocytes, UA Negative Negative   Assessment & Plan:  This visit occurred during the SARS-CoV-2 public health emergency.  Safety protocols were in place, including screening questions prior to the visit, additional usage of staff PPE, and extensive cleaning of exam room while observing appropriate contact time as indicated for disinfecting solutions.   Problem List Items Addressed This Visit    Neurogenic orthostatic hypotension ([place]) - Primary    Ongoing struggle. Again reviewed etiology of symptoms and progressive nature of disease, treatment aimed at management not cure.  Lauree Chandler has become unaffordable so has been transitioned to [employer] with some side effects noted but overall tolerating well.  Support provided.       Incomplete bladder emptying    Check UA.  Anticipate strattera related in h/o BPH.  Discussed - overall manageable.       Relevant Orders   POCT Urinalysis Dipstick (Automated) (Completed)   Decreased stamina    Remains predominant concern.          No orders of the defined types were placed in this encounter.  Orders Placed This Encounter  Procedures  . POCT Urinalysis Dipstick (Automated)     Patient Instructions  Continue strattera as per Dr Caryl Comes.  Continue to stay well hydrated.  Several of the symptoms such as heartburn, dry mouth, loss of appetite, urine trouble - could be coming from strattera. It sounds like several are already improving.  Urine test today to rule out infection or other cause of urinary trouble.    Follow up plan: Return if symptoms  worsen or fail to improve.  Ria Bush, MD

## 2019-10-31 NOTE — Patient Instructions (Addendum)
Continue strattera as per Dr Caryl Comes.  Continue to stay well hydrated.  Several of the symptoms such as heartburn, dry mouth, loss of appetite, urine trouble - could be coming from strattera. It sounds like several are already improving.  Urine test today to rule out infection or other cause of urinary trouble.

## 2019-11-01 NOTE — Assessment & Plan Note (Addendum)
Check UA.  Anticipate strattera related in h/o BPH.  Discussed - overall manageable.

## 2019-11-01 NOTE — Assessment & Plan Note (Signed)
Remains predominant concern.

## 2019-11-01 NOTE — Assessment & Plan Note (Signed)
Ongoing struggle. Again reviewed etiology of symptoms and progressive nature of disease, treatment aimed at management not cure.  Adam Clements has become unaffordable so has been transitioned to Adam Clements with some side effects noted but overall tolerating well.  Support provided.

## 2019-11-14 ENCOUNTER — Other Ambulatory Visit: Payer: Self-pay | Admitting: Family Medicine

## 2019-12-02 ENCOUNTER — Other Ambulatory Visit: Payer: Self-pay | Admitting: Podiatry

## 2019-12-02 ENCOUNTER — Other Ambulatory Visit: Payer: Self-pay

## 2019-12-02 ENCOUNTER — Ambulatory Visit (INDEPENDENT_AMBULATORY_CARE_PROVIDER_SITE_OTHER): Payer: Medicare HMO | Admitting: Podiatry

## 2019-12-02 ENCOUNTER — Encounter: Payer: Self-pay | Admitting: Podiatry

## 2019-12-02 DIAGNOSIS — M7671 Peroneal tendinitis, right leg: Secondary | ICD-10-CM

## 2019-12-02 DIAGNOSIS — Q828 Other specified congenital malformations of skin: Secondary | ICD-10-CM

## 2019-12-02 DIAGNOSIS — M79671 Pain in right foot: Secondary | ICD-10-CM

## 2019-12-05 NOTE — Progress Notes (Signed)
Subjective:   Patient ID: Adam Clements, male   DOB: 85 y.o.   MRN: [id number]   HPI Patient states he started to develop a lot of pain again on the bottom of his right foot and does not remember specific injury and stated he had a period of relief after the last time but it did come back again over a month ago   ROS      Objective:  Physical Exam  Neurovascular status intact with inflammation pain base of fifth metatarsal right with fluid buildup around the peroneal insertion and also just distal to this a porokeratotic type lesion that is painful when pressed     Assessment:  Inflammatory tendinitis of the peroneal tendon base of fifth metatarsal along with porokeratotic lesion     Plan:  H&P reviewed condition.  At this time I did do sterile prep and I injected the peroneal insertion base of fifth metatarsal 3 mg dexamethasone 5 mg Xylocaine and I went ahead and using sharp sterile debridement I debrided the lesion with no iatrogenic bleeding and applied sterile dressing.  Gave instructions on soaks and reappoint as needed and may require more aggressive bone resection if symptoms persist

## 2019-12-09 DIAGNOSIS — H524 Presbyopia: Secondary | ICD-10-CM | POA: Diagnosis not present

## 2019-12-19 DIAGNOSIS — R69 Illness, unspecified: Secondary | ICD-10-CM | POA: Diagnosis not present

## 2019-12-28 DIAGNOSIS — Z23 Encounter for immunization: Secondary | ICD-10-CM | POA: Diagnosis not present

## 2020-01-05 ENCOUNTER — Ambulatory Visit: Payer: Medicare HMO | Admitting: Family Medicine

## 2020-01-06 ENCOUNTER — Other Ambulatory Visit: Payer: Self-pay

## 2020-01-06 ENCOUNTER — Encounter: Payer: Self-pay | Admitting: Family Medicine

## 2020-01-06 ENCOUNTER — Ambulatory Visit (INDEPENDENT_AMBULATORY_CARE_PROVIDER_SITE_OTHER): Payer: Medicare HMO | Admitting: Family Medicine

## 2020-01-06 VITALS — BP 126/60 | HR 68 | Temp 97.6°F | Ht 67.25 in | Wt 145.6 lb

## 2020-01-06 DIAGNOSIS — D649 Anemia, unspecified: Secondary | ICD-10-CM

## 2020-01-06 DIAGNOSIS — G903 Multi-system degeneration of the autonomic nervous system: Secondary | ICD-10-CM | POA: Diagnosis not present

## 2020-01-06 LAB — BASIC METABOLIC PANEL
BUN: 29 mg/dL — ABNORMAL HIGH (ref 6–23)
CO2: 25 mEq/L (ref 19–32)
Calcium: 8.7 mg/dL (ref 8.4–10.5)
Chloride: 95 mEq/L — ABNORMAL LOW (ref 96–112)
Creatinine, Ser: 1.48 mg/dL (ref 0.40–1.50)
GFR: 42.93 mL/min — ABNORMAL LOW (ref >60.00)
Glucose, Bld: 89 mg/dL (ref 70–99)
Potassium: 4.9 mEq/L (ref 3.5–5.1)
Sodium: 127 mEq/L — ABNORMAL LOW (ref 135–145)

## 2020-01-06 LAB — IBC PANEL
Iron: 62 ug/dL (ref 42–165)
Saturation Ratios: 21.8 % (ref 20.0–50.0)
Transferrin: 203 mg/dL — ABNORMAL LOW (ref 212.0–360.0)

## 2020-01-06 LAB — VITAMIN B12: Vitamin B-12: 1268 pg/mL — ABNORMAL HIGH (ref 211–911)

## 2020-01-06 LAB — FOLATE: Folate: 12.1 ng/mL (ref >5.9)

## 2020-01-06 LAB — FERRITIN: Ferritin: 79.3 ng/mL (ref 22.0–322.0)

## 2020-01-06 NOTE — Progress Notes (Signed)
This visit was conducted in person.  BP 126/60 (BP Location: Left Arm, Patient Position: Sitting, Cuff Size: Normal)   Pulse 68   Temp 97.6 F (36.4 C) (Temporal)   Ht 5' 7.25" (1.708 m)   Wt 145 lb 9 oz (66 kg)   SpO2 95%   BMI 22.63 kg/m    CC: 3 mo f/u visit  Subjective:    Patient ID: Adam Clements, male    DOB: 1934-10-22, 85 y.o.   MRN: [id number]  HPI: Adam Clements is a 85 y.o. male presenting on 01/06/2020 for Follow-up (Here for 3 mo f/u.)   Known marked neurogenic orthostatic hypotension managed by EP currently on strattera 20mg  BID, previously on northera but became unaffordable.   Ongoing dizziness despite medication, using compression stockings (has knee and thigh high) and abdominal binder. Could do better with fluid intake.  Caffeine - drinks 1 pot green tea daily.  No recent syncopal episode.   He continues doing household finances.  Last saw neurology 12/2016.  Has upcoming EP appt next month.   Episode yesterday of mental fogginess without presyncope or syncope - occurred while shopping with wife, daughter and SIL. Lasted ~1 hour, improved with sitting and resting.      Relevant past medical, surgical, family and social history reviewed and updated as indicated. Interim medical history since our last visit reviewed. Allergies and medications reviewed and updated. Outpatient Medications Prior to Visit  Medication Sig Dispense Refill  . atomoxetine (STRATTERA) 10 MG capsule Take 2 capsules (20 mg total) by mouth 2 (two) times daily with a meal. Take 20mg  (2 caps) by mouth in the am x 7 days then begin 20mg  (2caps) by mouth twice daily. 120 capsule 2  . cholecalciferol (VITAMIN D) 1000 units tablet Take 1,000 Units by mouth daily.    Marland Kitchen dipyridamole-aspirin (AGGRENOX) 200-25 MG 12hr capsule TAKE 1 CAPSULE BY MOUTH TWICE A DAY 60 capsule 11  . pravastatin (PRAVACHOL) 20 MG tablet TAKE 1 TABLET(20 MG) BY MOUTH DAILY 90 tablet 3  . vitamin B-12  (CYANOCOBALAMIN) 500 MCG tablet Take 500 mcg by mouth daily.     No facility-administered medications prior to visit.     Per HPI unless specifically indicated in ROS section below Review of Systems Objective:  BP 126/60 (BP Location: Left Arm, Patient Position: Sitting, Cuff Size: Normal)   Pulse 68   Temp 97.6 F (36.4 C) (Temporal)   Ht 5' 7.25" (1.708 m)   Wt 145 lb 9 oz (66 kg)   SpO2 95%   BMI 22.63 kg/m   Wt Readings from Last 3 Encounters:  01/06/20 145 lb 9 oz (66 kg)  10/31/19 145 lb 7 oz (66 kg)  10/11/19 149 lb 9.6 oz (67.9 kg)      Physical Exam Vitals and nursing note reviewed.  Constitutional:      Appearance: Normal appearance. He is not ill-appearing.  HENT:     Ears:     Comments: Hard of hearing despite hearing aides Cardiovascular:     Rate and Rhythm: Normal rate and regular rhythm.     Pulses: Normal pulses.     Heart sounds: Normal heart sounds. No murmur heard.   Pulmonary:     Effort: Pulmonary effort is normal. No respiratory distress.     Breath sounds: Normal breath sounds. No wheezing, rhonchi or rales.  Musculoskeletal:     Right lower leg: No edema.     Left lower leg: No edema.  Comments: Compression stockings in place  Neurological:     Mental Status: He is alert.  Psychiatric:        Mood and Affect: Mood normal.        Behavior: Behavior normal.       Lab Results  Component Value Date   WBC 4.6 01/06/2020   HGB 10.9 (L) 01/06/2020   HCT 32.4 (L) 01/06/2020   MCV 96.7 01/06/2020   PLT 216 01/06/2020    Lab Results  Component Value Date   TSH 1.70 07/20/2019    Lab Results  Component Value Date   CREATININE 1.48 01/06/2020   BUN 29 (H) 01/06/2020   NA 127 (L) 01/06/2020   K 4.9 01/06/2020   CL 95 (L) 01/06/2020   CO2 25 01/06/2020   Assessment & Plan:  This visit occurred during the SARS-CoV-2 public health emergency.  Safety protocols were in place, including screening questions prior to the visit, additional  usage of staff PPE, and extensive cleaning of exam room while observing appropriate contact time as indicated for disinfecting solutions.   Problem List Items Addressed This Visit    Normocytic anemia    Mild normocytic anemia present for years with baseline Hgb 10-11 - will update anemia panel today with peripheral smear.       Relevant Orders   Ferritin (Completed)   IBC panel (Completed)   Vitamin B12 (Completed)   Folate (Completed)   Pathologist smear review   Lactate dehydrogenase (Completed)   CBC with Differential/Platelet (Completed)   Neurogenic orthostatic hypotension ([place]) - Primary    Again reviewed with patient. Anticipate recent confusional episodes due to cerebral hypoperfusion. Encouraged thigh high compression stockings and increased water intake. Continue Strattera      Relevant Orders   Basic metabolic panel (Completed)   CBC with Differential/Platelet (Completed)       No orders of the defined types were placed in this encounter.  Orders Placed This Encounter  Procedures  . Basic metabolic panel  . Ferritin  . IBC panel  . Vitamin B12  . Folate  . Pathologist smear review  . Lactate dehydrogenase  . CBC with Differential/Platelet    Patient Instructions  Lean towards using thigh high compression stockings and work on daily fluid intake.  Labs today  Return in 4-6 months for follow up visit.    Follow up plan: Return in about 6 months (around 07/06/2020), or if symptoms worsen or fail to improve, for follow up visit.  Ria Bush, MD

## 2020-01-06 NOTE — Assessment & Plan Note (Addendum)
Again reviewed with patient. Anticipate recent confusional episodes due to cerebral hypoperfusion. Encouraged thigh high compression stockings and increased water intake. Continue Strattera

## 2020-01-06 NOTE — Patient Instructions (Addendum)
Lean towards using thigh high compression stockings and work on daily fluid intake.  Labs today  Return in 4-6 months for follow up visit.

## 2020-01-07 NOTE — Assessment & Plan Note (Addendum)
Mild normocytic anemia present for years with baseline Hgb 10-11 - will update anemia panel today with peripheral smear.

## 2020-01-09 LAB — CBC WITH DIFFERENTIAL/PLATELET
Absolute Monocytes: 446 cells/uL (ref 200–950)
Basophils Absolute: 28 cells/uL (ref 0–200)
Basophils Relative: 0.6 %
Eosinophils Absolute: 129 cells/uL (ref 15–500)
Eosinophils Relative: 2.8 %
HCT: 32.4 % — ABNORMAL LOW (ref 38.5–50.0)
Hemoglobin: 10.9 g/dL — ABNORMAL LOW (ref 13.2–17.1)
Lymphs Abs: 731 cells/uL — ABNORMAL LOW (ref 850–3900)
MCH: 32.5 pg (ref 27.0–33.0)
MCHC: 33.6 g/dL (ref 32.0–36.0)
MCV: 96.7 fL (ref 80.0–100.0)
MPV: 10.2 fL (ref 7.5–12.5)
Monocytes Relative: 9.7 %
Neutro Abs: 3266 cells/uL (ref 1500–7800)
Neutrophils Relative %: 71 %
Platelets: 216 10*3/uL (ref 140–400)
RBC: 3.35 10*6/uL — ABNORMAL LOW (ref 4.20–5.80)
RDW: 12.2 % (ref 11.0–15.0)
Total Lymphocyte: 15.9 %
WBC: 4.6 10*3/uL (ref 3.8–10.8)

## 2020-01-09 LAB — LACTATE DEHYDROGENASE: LDH: 140 U/L (ref 120–250)

## 2020-01-09 LAB — PATHOLOGIST SMEAR REVIEW

## 2020-01-10 ENCOUNTER — Other Ambulatory Visit: Payer: Self-pay | Admitting: Family Medicine

## 2020-01-10 MED ORDER — VITAMIN B-12 500 MCG PO TABS: 500.0000 ug | ORAL_TABLET | ORAL | Status: AC

## 2020-02-01 ENCOUNTER — Other Ambulatory Visit: Payer: Self-pay | Admitting: Internal Medicine

## 2020-02-02 NOTE — Telephone Encounter (Signed)
Please keep 02/14/2020 appointment for additional refills.

## 2020-02-02 NOTE — Telephone Encounter (Signed)
Pt's pharmacy is requesting a refill on atomoxetine. Would Dr. Caryl Comes like to refill this medication? Please address

## 2020-02-07 ENCOUNTER — Inpatient Hospital Stay (HOSPITAL_COMMUNITY)
Admission: EM | Admit: 2020-02-07 | Discharge: 2020-02-22 | DRG: 057 | Disposition: A | Discharge status: Skilled Nursing Facility | Payer: Medicare HMO | Attending: Internal Medicine | Admitting: Internal Medicine

## 2020-02-07 ENCOUNTER — Emergency Department (HOSPITAL_COMMUNITY): Payer: Medicare HMO

## 2020-02-07 DIAGNOSIS — I672 Cerebral atherosclerosis: Secondary | ICD-10-CM | POA: Diagnosis not present

## 2020-02-07 DIAGNOSIS — I428 Other cardiomyopathies: Secondary | ICD-10-CM | POA: Diagnosis present

## 2020-02-07 DIAGNOSIS — Z79899 Other long term (current) drug therapy: Secondary | ICD-10-CM

## 2020-02-07 DIAGNOSIS — R296 Repeated falls: Secondary | ICD-10-CM | POA: Diagnosis not present

## 2020-02-07 DIAGNOSIS — W19XXXA Unspecified fall, initial encounter: Secondary | ICD-10-CM | POA: Diagnosis not present

## 2020-02-07 DIAGNOSIS — D649 Anemia, unspecified: Secondary | ICD-10-CM | POA: Diagnosis not present

## 2020-02-07 DIAGNOSIS — I6529 Occlusion and stenosis of unspecified carotid artery: Secondary | ICD-10-CM | POA: Diagnosis not present

## 2020-02-07 DIAGNOSIS — Z66 Do not resuscitate: Secondary | ICD-10-CM | POA: Diagnosis present

## 2020-02-07 DIAGNOSIS — I5042 Chronic combined systolic (congestive) and diastolic (congestive) heart failure: Secondary | ICD-10-CM | POA: Diagnosis present

## 2020-02-07 DIAGNOSIS — N1831 Chronic kidney disease, stage 3a: Secondary | ICD-10-CM | POA: Diagnosis present

## 2020-02-07 DIAGNOSIS — R55 Syncope and collapse: Secondary | ICD-10-CM

## 2020-02-07 DIAGNOSIS — Z823 Family history of stroke: Secondary | ICD-10-CM

## 2020-02-07 DIAGNOSIS — G903 Multi-system degeneration of the autonomic nervous system: Principal | ICD-10-CM | POA: Diagnosis present

## 2020-02-07 DIAGNOSIS — I739 Peripheral vascular disease, unspecified: Secondary | ICD-10-CM | POA: Diagnosis present

## 2020-02-07 DIAGNOSIS — R404 Transient alteration of awareness: Secondary | ICD-10-CM | POA: Diagnosis not present

## 2020-02-07 DIAGNOSIS — R410 Disorientation, unspecified: Secondary | ICD-10-CM | POA: Diagnosis not present

## 2020-02-07 DIAGNOSIS — Z86007 Personal history of in-situ neoplasm of skin: Secondary | ICD-10-CM

## 2020-02-07 DIAGNOSIS — Z8673 Personal history of transient ischemic attack (TIA), and cerebral infarction without residual deficits: Secondary | ICD-10-CM

## 2020-02-07 DIAGNOSIS — J309 Allergic rhinitis, unspecified: Secondary | ICD-10-CM | POA: Diagnosis present

## 2020-02-07 DIAGNOSIS — Z20822 Contact with and (suspected) exposure to covid-19: Secondary | ICD-10-CM | POA: Diagnosis present

## 2020-02-07 DIAGNOSIS — Z781 Physical restraint status: Secondary | ICD-10-CM

## 2020-02-07 DIAGNOSIS — I10 Essential (primary) hypertension: Secondary | ICD-10-CM | POA: Diagnosis not present

## 2020-02-07 DIAGNOSIS — M47812 Spondylosis without myelopathy or radiculopathy, cervical region: Secondary | ICD-10-CM | POA: Diagnosis not present

## 2020-02-07 DIAGNOSIS — Z87891 Personal history of nicotine dependence: Secondary | ICD-10-CM

## 2020-02-07 DIAGNOSIS — Z515 Encounter for palliative care: Secondary | ICD-10-CM

## 2020-02-07 DIAGNOSIS — E871 Hypo-osmolality and hyponatremia: Secondary | ICD-10-CM | POA: Diagnosis present

## 2020-02-07 DIAGNOSIS — G4752 REM sleep behavior disorder: Secondary | ICD-10-CM | POA: Diagnosis present

## 2020-02-07 DIAGNOSIS — S0181XA Laceration without foreign body of other part of head, initial encounter: Secondary | ICD-10-CM | POA: Diagnosis present

## 2020-02-07 DIAGNOSIS — Z7189 Other specified counseling: Secondary | ICD-10-CM

## 2020-02-07 DIAGNOSIS — I130 Hypertensive heart and chronic kidney disease with heart failure and stage 1 through stage 4 chronic kidney disease, or unspecified chronic kidney disease: Secondary | ICD-10-CM | POA: Diagnosis present

## 2020-02-07 DIAGNOSIS — Z83438 Family history of other disorder of lipoprotein metabolism and other lipidemia: Secondary | ICD-10-CM

## 2020-02-07 DIAGNOSIS — J90 Pleural effusion, not elsewhere classified: Secondary | ICD-10-CM | POA: Diagnosis not present

## 2020-02-07 DIAGNOSIS — N179 Acute kidney failure, unspecified: Secondary | ICD-10-CM | POA: Diagnosis present

## 2020-02-07 DIAGNOSIS — M795 Residual foreign body in soft tissue: Secondary | ICD-10-CM

## 2020-02-07 DIAGNOSIS — E785 Hyperlipidemia, unspecified: Secondary | ICD-10-CM | POA: Diagnosis present

## 2020-02-07 DIAGNOSIS — Z888 Allergy status to other drugs, medicaments and biological substances status: Secondary | ICD-10-CM

## 2020-02-07 DIAGNOSIS — I2510 Atherosclerotic heart disease of native coronary artery without angina pectoris: Secondary | ICD-10-CM | POA: Diagnosis present

## 2020-02-07 DIAGNOSIS — Z8249 Family history of ischemic heart disease and other diseases of the circulatory system: Secondary | ICD-10-CM

## 2020-02-07 DIAGNOSIS — G909 Disorder of the autonomic nervous system, unspecified: Secondary | ICD-10-CM

## 2020-02-07 LAB — CBC WITH DIFFERENTIAL/PLATELET
Abs Immature Granulocytes: 0.02 10*3/uL (ref 0.00–0.07)
Basophils Absolute: 0 10*3/uL (ref 0.0–0.1)
Basophils Relative: 1 %
Eosinophils Absolute: 0.1 10*3/uL (ref 0.0–0.5)
Eosinophils Relative: 1 %
HCT: 31.8 % — ABNORMAL LOW (ref 39.0–52.0)
Hemoglobin: 10.8 g/dL — ABNORMAL LOW (ref 13.0–17.0)
Immature Granulocytes: 0 %
Lymphocytes Relative: 11 %
Lymphs Abs: 0.7 10*3/uL (ref 0.7–4.0)
MCH: 31.9 pg (ref 26.0–34.0)
MCHC: 34 g/dL (ref 30.0–36.0)
MCV: 93.8 fL (ref 80.0–100.0)
Monocytes Absolute: 0.6 10*3/uL (ref 0.1–1.0)
Monocytes Relative: 10 %
Neutro Abs: 4.8 10*3/uL (ref 1.7–7.7)
Neutrophils Relative %: 77 %
Platelets: 209 10*3/uL (ref 150–400)
RBC: 3.39 MIL/uL — ABNORMAL LOW (ref 4.22–5.81)
RDW: 11.9 % (ref 11.5–15.5)
WBC: 6.2 10*3/uL (ref 4.0–10.5)
nRBC: 0 % (ref 0.0–0.2)

## 2020-02-07 LAB — RESPIRATORY PANEL BY RT PCR (FLU A&B, COVID)
Influenza A by PCR: NEGATIVE
Influenza B by PCR: NEGATIVE
SARS Coronavirus 2 by RT PCR: NEGATIVE

## 2020-02-07 LAB — BASIC METABOLIC PANEL
Anion gap: 10 (ref 5–15)
BUN: 36 mg/dL — ABNORMAL HIGH (ref 8–23)
CO2: 22 mmol/L (ref 22–32)
Calcium: 8.5 mg/dL — ABNORMAL LOW (ref 8.9–10.3)
Chloride: 93 mmol/L — ABNORMAL LOW (ref 98–111)
Creatinine, Ser: 1.64 mg/dL — ABNORMAL HIGH (ref 0.61–1.24)
GFR, Estimated: 41 mL/min — ABNORMAL LOW (ref >60)
Glucose, Bld: 128 mg/dL — ABNORMAL HIGH (ref 70–99)
Potassium: 3.7 mmol/L (ref 3.5–5.1)
Sodium: 125 mmol/L — ABNORMAL LOW (ref 135–145)

## 2020-02-07 LAB — HEPATIC FUNCTION PANEL
ALT: 13 U/L (ref 0–44)
AST: 20 U/L (ref 15–41)
Albumin: 3.2 g/dL — ABNORMAL LOW (ref 3.5–5.0)
Alkaline Phosphatase: 48 U/L (ref 38–126)
Bilirubin, Direct: <0.1 mg/dL (ref 0.0–0.2)
Total Bilirubin: 0.6 mg/dL (ref 0.3–1.2)
Total Protein: 5.9 g/dL — ABNORMAL LOW (ref 6.5–8.1)

## 2020-02-07 LAB — TROPONIN I (HIGH SENSITIVITY)
Troponin I (High Sensitivity): 50 ng/L — ABNORMAL HIGH (ref <18)
Troponin I (High Sensitivity): 50 ng/L — ABNORMAL HIGH (ref <18)

## 2020-02-07 MED ORDER — SODIUM CHLORIDE 0.9 % IV SOLN: Freq: Once | INTRAVENOUS | Status: AC

## 2020-02-07 NOTE — ED Provider Notes (Signed)
[facility] EMERGENCY DEPARTMENT Provider Note   CSN: [id number] Arrival date & time: 02/07/20  2029     History Chief Complaint  Patient presents with  . Loss of Consciousness    Adam Clements is a 85 y.o. male.  Level five caveat due to patient being hard of hearing and some memory issues.  Has a prolonged history of orthostatic hypotension but according to his wife he is having more more syncopal episodes here over the last week.  Has had maybe seven different syncopal episodes today.  Typically they are when he is walking.  He has a small laceration to his forehead that occurred from a fall 3 days ago.  Patient is on Strattera and follows with cardiology.  Overall he appears well and does not have any specific complaints when asking him questions.  The history is provided by the patient.  Loss of Consciousness Episode history:  Multiple Most recent episode:  Today Timing:  Intermittent Progression:  Waxing and waning Chronicity:  Recurrent Witnessed: yes   Relieved by:  Nothing Worsened by:  Nothing Associated symptoms: no chest pain, no fever, no palpitations, no seizures, no shortness of breath and no vomiting        Past Medical History:  Diagnosis Date  . Allergic rhinitis   . Arthritis    Gout  . BPH (benign prostatic hypertrophy)   . Bradycardia   . CAD (coronary artery disease)    nonobstructive  . Chronic anemia 2013   h/o IDA, did not tolerate oral iron, latest normal iron studies, B12, folate  . Diverticulosis of colon    conoscopy 12/1993 (Dr. Amedeo Plenty) S/G divertics//colonoscopy L&R divertics (Dr. Amedeo Plenty) 11/08/2004  . Dizziness   . Gout   . HTN (hypertension)   . Memory loss   . Mitral regurgitation    Pulmonic regurgitation, with marked LA enlargement  . Nonischemic cardiomyopathy (HCC)    EF 40%  . Orthostatic hypotension   . PVC (premature ventricular contraction)   . PVD (peripheral vascular disease) ([place])    followed by  Dr Trula Slade, treating medically (pletal and aspirin)  . RBBB longstanding  . Squamous cell carcinoma in situ of skin 2015   L ear  . Syncope     Patient Active Problem List   Diagnosis Date Noted  . Health maintenance examination 10/01/2019  . Hematospermia 10/01/2019  . Monoclonal gammopathy of undetermined significance 08/13/2019  . Hyponatremia 07/24/2019  . Right foot pain 07/05/2019  . NICM (nonischemic cardiomyopathy) ([place]) 05/30/2019  . Scrotal masses 05/25/2019  . MCI (mild cognitive impairment) with memory loss 09/22/2018  . Inadequate oral nutritional intake 04/03/2018  . Biceps tendon rupture, right, initial encounter 04/02/2018  . 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 Vision loss, left eye 08/18/2017  . Incomplete bladder emptying 04/14/2017  . Protein malnutrition ([place]) 09/15/2016  . Bilateral sensorineural hearing loss 07/21/2016  . Decreased stamina 07/21/2016  . RBBB 06/30/2015  . Confusional state 05/07/2015  . TIA (transient ischemic attack) 01/02/2015  . Syncope due to orthostatic hypotension 12/27/2013  . Advanced care planning/counseling discussion 12/12/2013  . Neurogenic orthostatic hypotension ([place]) 06/03/2013  . Chronic venous insufficiency 05/26/2013  . PAD (peripheral artery disease) ([place]) 08/09/2012  . Medicare annual wellness visit, subsequent 11/10/2011  . Normocytic anemia 09/18/2011  . Weight loss 09/12/2011  . HFrEF (heart failure with reduced ejection fraction) ([place]) 07/18/2010  . Atrial arrhythmia 11/15/2008  . MITRAL REGURGITATION 08/25/2008  . Biceps tendon rupture, left,  sequela 10/01/2007  . Palpitations 12/29/2006  . Allergic rhinitis 10/07/2006  . DIVERTICULOSIS, COLON 10/07/2006  . BPH (benign prostatic hyperplasia) 10/07/2006  . Rosacea 10/07/2006    Past Surgical History:  Procedure Laterality Date  . 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 APPENDECTOMY    . dexa  2004   "shrunk 2 inches", WNL       Family  History  Problem Relation Age of Onset  . Heart disease Mother 83       Natural causes with CHF  . Hypertension Mother   . Hypertension Father   . Stroke Father        cerebral hemorrhage  . Hyperlipidemia Brother   . Heart disease Brother        before age 92  . Hypertension Brother   . Heart attack Brother   . Hypertension Other     Social History   Tobacco Use  . Smoking status: Former Smoker    Packs/day: 2.00    Types: Cigarettes    Quit date: 11/10/1982    Years since quitting: 37.2  . Smokeless tobacco: Former Network engineer  . Vaping Use: Never used  Substance Use Topics  . Alcohol use: Yes    Alcohol/week: 7.0 standard drinks    Types: 7 Glasses of wine per week    Comment: 1 glass of wine daily. History of heavy drinking prior to 2005.  . Drug use: No    Home Medications Prior to Admission medications   Medication Sig Start Date End Date Taking? Authorizing Provider  atomoxetine (STRATTERA) 10 MG capsule Take 2 capsules (20 mg total) by mouth 2 (two) times daily. 02/02/20  Yes Deboraha Sprang, MD  cholecalciferol (VITAMIN D) 1000 units tablet Take 1,000 Units by mouth daily.   Yes [provider]  dipyridamole-aspirin (AGGRENOX) 200-25 MG 12hr capsule TAKE 1 CAPSULE BY MOUTH TWICE A DAY Patient taking differently: Take 1 capsule by mouth 2 (two) times daily.  10/13/19  Yes Ria Bush, MD  pravastatin (PRAVACHOL) 20 MG tablet TAKE 1 TABLET(20 MG) BY MOUTH DAILY Patient taking differently: Take 20 mg by mouth daily.  11/15/19  Yes Ria Bush, MD  vitamin B-12 (CYANOCOBALAMIN) 500 MCG tablet Take 1 tablet (500 mcg total) by mouth every Monday, Wednesday, and Friday. 01/11/20  Yes Ria Bush, MD    Allergies    Atorvastatin and Pletal [cilostazol]  Review of Systems   Review of Systems  Constitutional: Negative for chills and fever.  HENT: Negative for ear pain and sore throat.   Eyes: Negative for pain and visual disturbance.    Respiratory: Negative for cough and shortness of breath.   Cardiovascular: Positive for syncope. Negative for chest pain and palpitations.  Gastrointestinal: Negative for abdominal pain and vomiting.  Genitourinary: Negative for dysuria and hematuria.  Musculoskeletal: Negative for arthralgias and back pain.  Skin: Negative for color change and rash.  Neurological: Positive for syncope. Negative for seizures.  All other systems reviewed and are negative.   Physical Exam Updated Vital Signs  ED Triage Vitals [02/07/20 2032]  Enc Vitals Group     BP (!) 189/90     Pulse Rate 72     Resp 18     Temp 97.8 F (36.6 C)     Temp Source Oral     SpO2 97 %     Weight      Height      Head Circumference  Peak Flow      Pain Score      Pain Loc      Pain Edu?      Excl. in [place]?     Physical Exam Vitals and nursing note reviewed.  Constitutional:      General: He is not in acute distress.    Appearance: He is well-developed. He is not ill-appearing.  HENT:     Head: Normocephalic and atraumatic.     Nose: Nose normal.     Mouth/Throat:     Mouth: Mucous membranes are moist.  Eyes:     Extraocular Movements: Extraocular movements intact.     Conjunctiva/sclera: Conjunctivae normal.     Pupils: Pupils are equal, round, and reactive to light.  Cardiovascular:     Rate and Rhythm: Normal rate and regular rhythm.     Pulses: Normal pulses.     Heart sounds: Normal heart sounds. No murmur heard.   Pulmonary:     Effort: Pulmonary effort is normal. No respiratory distress.     Breath sounds: Normal breath sounds.  Abdominal:     Palpations: Abdomen is soft.     Tenderness: There is no abdominal tenderness.  Musculoskeletal:     Cervical back: Neck supple.  Skin:    General: Skin is warm and dry.     Capillary Refill: Capillary refill takes less than 2 seconds.  Neurological:     General: No focal deficit present.     Mental Status: He is alert.     Cranial Nerves:  No cranial nerve deficit.     Sensory: No sensory deficit.     Motor: No weakness.     ED Results / Procedures / Treatments   Labs (all labs ordered are listed, but only abnormal results are displayed) Labs Reviewed  CBC WITH DIFFERENTIAL/PLATELET - Abnormal; Notable for the following components:      Result Value   RBC 3.39 (*)    Hemoglobin 10.8 (*)    HCT 31.8 (*)    All other components within normal limits  BASIC METABOLIC PANEL - Abnormal; Notable for the following components:   Sodium 125 (*)    Chloride 93 (*)    Glucose, Bld 128 (*)    BUN 36 (*)    Creatinine, Ser 1.64 (*)    Calcium 8.5 (*)    GFR, Estimated 41 (*)    All other components within normal limits  HEPATIC FUNCTION PANEL - Abnormal; Notable for the following components:   Total Protein 5.9 (*)    Albumin 3.2 (*)    All other components within normal limits  TROPONIN I (HIGH SENSITIVITY) - Abnormal; Notable for the following components:   Troponin I (High Sensitivity) 50 (*)    All other components within normal limits  RESPIRATORY PANEL BY RT PCR (FLU A&B, COVID)  URINALYSIS, ROUTINE W REFLEX MICROSCOPIC  TROPONIN I (HIGH SENSITIVITY)    EKG EKG Interpretation  Date/Time:  Tuesday February 07 2020 21:09:59 EST Ventricular Rate:  65 PR Interval:  188 QRS Duration: 164 QT Interval:  450 QTC Calculation: 468 R Axis:   79 Text Interpretation: Normal sinus rhythm Right bundle branch block Minimal voltage criteria for LVH, may be normal variant ( Sokolow-Lyon ) Abnormal ECG Confirmed by Lennice Sites [phone]) on 02/07/2020 9:21:59 PM   Radiology DG Chest 2 View  Result Date: 02/07/2020 CLINICAL DATA:  Multiple falls EXAM: CHEST - 2 VIEW COMPARISON:  2017 FINDINGS: Increased top-normal heart size.  Chronic interstitial prominence. No pleural effusion. No acute osseous abnormality. IMPRESSION: Chronic interstitial changes.  Increased top-normal heart size. Electronically Signed   By: Macy Mis  M.D.   On: 02/07/2020 21:47   CT Head Wo Contrast  Result Date: 02/07/2020 CLINICAL DATA:  Multiple falls, orthostatic, normocytic anemia EXAM: CT HEAD WITHOUT CONTRAST CT CERVICAL SPINE WITHOUT CONTRAST TECHNIQUE: Multidetector CT imaging of the head and cervical spine was performed following the standard protocol without intravenous contrast. Multiplanar CT image reconstructions of the cervical spine were also generated. COMPARISON:  CT 08/26/2017, MRI 02/22/2016 FINDINGS: CT HEAD FINDINGS Brain: Scattered hypoattenuating foci in the bilateral basal ganglia and external capsule likely reflect areas of remote lacunar type infarcts. No evidence of acute infarction, hemorrhage, hydrocephalus, extra-axial collection, visible mass lesion or mass effect. Symmetric prominence of the ventricles, cisterns and sulci compatible with parenchymal volume loss. Patchy areas of white matter hypoattenuation are most compatible with chronic microvascular angiopathy. Vascular: Atherosclerotic calcification of the carotid siphons and intradural vertebral arteries. No hyperdense vessel. Skull: Some mild right temporoparietal and left temporal scalp thickening. Additional thickening and possible laceration of the left frontal scalp as well. No calvarial fracture or acute osseous injury is identified. Sinuses/Orbits: Paranasal sinuses and mastoid air cells are predominantly clear. Orbital structures are unremarkable aside from prior lens extractions. Other: None. CT CERVICAL SPINE FINDINGS Alignment: Cervical stabilization collar is in place at the time of examination. Mild straightening of normal cervical lordosis, possibly positional, degenerative or related to muscle spasm. No evidence of traumatic listhesis. No abnormally widened, perched or jumped facets. Normal alignment of the craniocervical and atlantoaxial articulations. Skull base and vertebrae: No acute skull base fracture. No vertebral body fracture or height loss.  Normal bone mineralization. No worrisome osseous lesions. Moderate arthrosis at the atlantodental interval. Cervical spondylitic changes, detailed below. Soft tissues and spinal canal: No pre or paravertebral fluid or swelling. No visible canal hematoma. Disc levels: Multilevel intervertebral disc height loss with spondylitic endplate changes. Multilevel disc osteophyte complexes efface the ventral thecal sac without significant resulting spinal canal stenosis. Multilevel uncinate spurring and facet degenerative changes which are most pronounced on the left at C3-4 and on the right at C2-3 where more moderate foraminal impingement is present. More mild multilevel foraminal narrowing is seen throughout the remaining cervical levels. Upper chest: Extensive biapical pleuroparenchymal scarring is noted with some apical emphysematous changes as well. Cervical carotid atherosclerosis is present. Other: No concerning thyroid nodules or masses. IMPRESSION: 1. No acute intracranial abnormality. 2. Some mild right temporoparietal and left temporal scalp thickening. Additional thickening and possible laceration of the left frontal scalp as well. No calvarial fracture or acute osseous injury is identified. 3. Chronic microvascular ischemic changes, parenchymal volume loss and intracranial atherosclerosis. 4. Punctate hypoattenuating foci in the bilateral basal ganglia and external capsules compatible with prior lacunar infarcts. 5. No evidence of acute fracture or traumatic listhesis of the cervical spine. 6. Cervical spondylitic and facet degenerative changes, as above. 7. Cervical and intracranial atherosclerosis. Electronically Signed   By: Lovena Le M.D.   On: 02/07/2020 22:05   CT Cervical Spine Wo Contrast  Result Date: 02/07/2020 CLINICAL DATA:  Multiple falls, orthostatic, normocytic anemia EXAM: CT HEAD WITHOUT CONTRAST CT CERVICAL SPINE WITHOUT CONTRAST TECHNIQUE: Multidetector CT imaging of the head and  cervical spine was performed following the standard protocol without intravenous contrast. Multiplanar CT image reconstructions of the cervical spine were also generated. COMPARISON:  CT 08/26/2017, MRI 02/22/2016 FINDINGS: CT HEAD FINDINGS  Brain: Scattered hypoattenuating foci in the bilateral basal ganglia and external capsule likely reflect areas of remote lacunar type infarcts. No evidence of acute infarction, hemorrhage, hydrocephalus, extra-axial collection, visible mass lesion or mass effect. Symmetric prominence of the ventricles, cisterns and sulci compatible with parenchymal volume loss. Patchy areas of white matter hypoattenuation are most compatible with chronic microvascular angiopathy. Vascular: Atherosclerotic calcification of the carotid siphons and intradural vertebral arteries. No hyperdense vessel. Skull: Some mild right temporoparietal and left temporal scalp thickening. Additional thickening and possible laceration of the left frontal scalp as well. No calvarial fracture or acute osseous injury is identified. Sinuses/Orbits: Paranasal sinuses and mastoid air cells are predominantly clear. Orbital structures are unremarkable aside from prior lens extractions. Other: None. CT CERVICAL SPINE FINDINGS Alignment: Cervical stabilization collar is in place at the time of examination. Mild straightening of normal cervical lordosis, possibly positional, degenerative or related to muscle spasm. No evidence of traumatic listhesis. No abnormally widened, perched or jumped facets. Normal alignment of the craniocervical and atlantoaxial articulations. Skull base and vertebrae: No acute skull base fracture. No vertebral body fracture or height loss. Normal bone mineralization. No worrisome osseous lesions. Moderate arthrosis at the atlantodental interval. Cervical spondylitic changes, detailed below. Soft tissues and spinal canal: No pre or paravertebral fluid or swelling. No visible canal hematoma. Disc  levels: Multilevel intervertebral disc height loss with spondylitic endplate changes. Multilevel disc osteophyte complexes efface the ventral thecal sac without significant resulting spinal canal stenosis. Multilevel uncinate spurring and facet degenerative changes which are most pronounced on the left at C3-4 and on the right at C2-3 where more moderate foraminal impingement is present. More mild multilevel foraminal narrowing is seen throughout the remaining cervical levels. Upper chest: Extensive biapical pleuroparenchymal scarring is noted with some apical emphysematous changes as well. Cervical carotid atherosclerosis is present. Other: No concerning thyroid nodules or masses. IMPRESSION: 1. No acute intracranial abnormality. 2. Some mild right temporoparietal and left temporal scalp thickening. Additional thickening and possible laceration of the left frontal scalp as well. No calvarial fracture or acute osseous injury is identified. 3. Chronic microvascular ischemic changes, parenchymal volume loss and intracranial atherosclerosis. 4. Punctate hypoattenuating foci in the bilateral basal ganglia and external capsules compatible with prior lacunar infarcts. 5. No evidence of acute fracture or traumatic listhesis of the cervical spine. 6. Cervical spondylitic and facet degenerative changes, as above. 7. Cervical and intracranial atherosclerosis. Electronically Signed   By: Lovena Le M.D.   On: 02/07/2020 22:05    Procedures Procedures (including critical care time)  Medications Ordered in ED Medications  0.9 %  sodium chloride infusion (has no administration in time range)    ED Course  I have reviewed the triage vital signs and the nursing notes.  Pertinent labs & imaging results that were available during my care of the patient were reviewed by me and considered in my medical decision making (see chart for details).    MDM Rules/Calculators/A&P                          Adam Clements is  an 85 year old male with history of CAD, neurogenic orthostatic syncope who presents to the ED with syncope episodes.  Patient hypertensive upon arrival but otherwise normal vitals.  Patient in a cervical collar brought in by EMS after seven syncopal events today.  Wife at the bedside who states increasing frequency of his syncope episodes that has been dealing with for a  long time and follows with Dr. Caryl Comes with cardiology.  Patient is on Strattera.  Sounds like he has had an extensive work-up in the past for this diagnosis but recently things have gotten worse per the wife.  He appears to be neurologically intact on exam but he is very hard of hearing and has some memory issues/dementia.  We will get a CT scan of his head, neck.  Does not have any pain in his extremities including his pelvis.  Will get a chest x-ray, troponin, basic labs and a urine study.  EKG shows normal sinus rhythm with a right bundle branch block.  Overall EKG appears unchanged from prior.  Given his increased frequency of syncopes will admit the patient for telemetry and possibly echocardiogram and further work-up and possibly evaluation by electrophysiologist.  Patient does live at home by himself does not use any assisted devices at home to get around and still does a lot of his ADLs.  Patient with no acute injury to the head or neck.  No other acute findings on CT of head and neck.  Patient with sodium of 125, creatinine 1.64.  Troponin 50.  Overall labs seem around his baseline.  Suspect ongoing syncopal issues however since he is having more episodes believe he would benefit from telemetry and observation, likely cardiology to see in morning.  We will start some gentle fluid hydration.  To be admitted to medicine for further care.  This chart was dictated using voice recognition software.  Despite best efforts to proofread,  errors can occur which can change the documentation meaning.     Final Clinical Impression(s) / ED  Diagnoses Final diagnoses:  Syncope and collapse    Rx / DC Orders ED Discharge Orders    None       Lennice Sites, DO 02/07/20 2216

## 2020-02-07 NOTE — ED Triage Notes (Addendum)
Pt arrives via EMS from home. Wife called 911 after pt fell this evening. Wife reported to EMS that pt has fallen 7 times today with episodes of syncope. Hx of orthostatic hypotension and normocytic anemia. EMS reports that pt had another syncopal episode while he was being transferred from home recliner onto EMS gurney.   EMS placed c-collar for precautions. EMS: 20G IV L. Forearm 170/90 97% RA HR 80; noted PVCs RR 16 CBG 154  Per EMS, pt oriented only to self and place. Wife reports that pt has hx of confusion.   Abrasion noted on midline of forehead and nose. Possible hematoma noted on L side of forehead.

## 2020-02-08 ENCOUNTER — Observation Stay (HOSPITAL_COMMUNITY): Payer: Medicare HMO

## 2020-02-08 ENCOUNTER — Encounter (HOSPITAL_COMMUNITY): Payer: Self-pay | Admitting: Internal Medicine

## 2020-02-08 ENCOUNTER — Observation Stay (HOSPITAL_BASED_OUTPATIENT_CLINIC_OR_DEPARTMENT_OTHER): Payer: Medicare HMO

## 2020-02-08 DIAGNOSIS — I428 Other cardiomyopathies: Secondary | ICD-10-CM

## 2020-02-08 DIAGNOSIS — G909 Disorder of the autonomic nervous system, unspecified: Secondary | ICD-10-CM

## 2020-02-08 DIAGNOSIS — G319 Degenerative disease of nervous system, unspecified: Secondary | ICD-10-CM | POA: Diagnosis not present

## 2020-02-08 DIAGNOSIS — M47816 Spondylosis without myelopathy or radiculopathy, lumbar region: Secondary | ICD-10-CM | POA: Diagnosis not present

## 2020-02-08 DIAGNOSIS — R9082 White matter disease, unspecified: Secondary | ICD-10-CM | POA: Diagnosis not present

## 2020-02-08 DIAGNOSIS — R55 Syncope and collapse: Secondary | ICD-10-CM

## 2020-02-08 DIAGNOSIS — G9089 Other disorders of autonomic nervous system: Secondary | ICD-10-CM

## 2020-02-08 DIAGNOSIS — R29818 Other symptoms and signs involving the nervous system: Secondary | ICD-10-CM | POA: Diagnosis not present

## 2020-02-08 DIAGNOSIS — Z01818 Encounter for other preprocedural examination: Secondary | ICD-10-CM | POA: Diagnosis not present

## 2020-02-08 LAB — URINALYSIS, ROUTINE W REFLEX MICROSCOPIC
Bilirubin Urine: NEGATIVE
Glucose, UA: 50 mg/dL — AB
Ketones, ur: NEGATIVE mg/dL
Leukocytes,Ua: NEGATIVE
Nitrite: NEGATIVE
Protein, ur: NEGATIVE mg/dL
Specific Gravity, Urine: 1.017 (ref 1.005–1.030)
pH: 5 (ref 5.0–8.0)

## 2020-02-08 LAB — CBC
HCT: 28.9 % — ABNORMAL LOW (ref 39.0–52.0)
Hemoglobin: 10 g/dL — ABNORMAL LOW (ref 13.0–17.0)
MCH: 32.3 pg (ref 26.0–34.0)
MCHC: 34.6 g/dL (ref 30.0–36.0)
MCV: 93.2 fL (ref 80.0–100.0)
Platelets: 211 10*3/uL (ref 150–400)
RBC: 3.1 MIL/uL — ABNORMAL LOW (ref 4.22–5.81)
RDW: 11.9 % (ref 11.5–15.5)
WBC: 5.2 10*3/uL (ref 4.0–10.5)
nRBC: 0 % (ref 0.0–0.2)

## 2020-02-08 LAB — BASIC METABOLIC PANEL
Anion gap: 7 (ref 5–15)
Anion gap: 9 (ref 5–15)
BUN: 33 mg/dL — ABNORMAL HIGH (ref 8–23)
BUN: 33 mg/dL — ABNORMAL HIGH (ref 8–23)
CO2: 20 mmol/L — ABNORMAL LOW (ref 22–32)
CO2: 21 mmol/L — ABNORMAL LOW (ref 22–32)
Calcium: 7.8 mg/dL — ABNORMAL LOW (ref 8.9–10.3)
Calcium: 8.7 mg/dL — ABNORMAL LOW (ref 8.9–10.3)
Chloride: 102 mmol/L (ref 98–111)
Chloride: 98 mmol/L (ref 98–111)
Creatinine, Ser: 1.26 mg/dL — ABNORMAL HIGH (ref 0.61–1.24)
Creatinine, Ser: 1.33 mg/dL — ABNORMAL HIGH (ref 0.61–1.24)
GFR, Estimated: 57 mL/min — ABNORMAL LOW (ref >60)
GFR, Estimated: 53 mL/min — ABNORMAL LOW (ref >60)
Glucose, Bld: 92 mg/dL (ref 70–99)
Glucose, Bld: 125 mg/dL — ABNORMAL HIGH (ref 70–99)
Potassium: 3.5 mmol/L (ref 3.5–5.1)
Potassium: 4 mmol/L (ref 3.5–5.1)
Sodium: 129 mmol/L — ABNORMAL LOW (ref 135–145)
Sodium: 128 mmol/L — ABNORMAL LOW (ref 135–145)

## 2020-02-08 LAB — ECHOCARDIOGRAM COMPLETE
AR max vel: 1.58 cm2
AV Area VTI: 1.83 cm2
AV Area mean vel: 1.47 cm2
AV Mean grad: 3 mmHg
AV Peak grad: 5.8 mmHg
Ao pk vel: 1.2 m/s
Calc EF: 38.9 %
S' Lateral: 4.3 cm
Single Plane A2C EF: 45.4 %
Single Plane A4C EF: 30.5 %

## 2020-02-08 LAB — CREATININE, SERUM
Creatinine, Ser: 1.45 mg/dL — ABNORMAL HIGH (ref 0.61–1.24)
GFR, Estimated: 48 mL/min — ABNORMAL LOW (ref >60)

## 2020-02-08 LAB — TROPONIN I (HIGH SENSITIVITY): Troponin I (High Sensitivity): 61 ng/L — ABNORMAL HIGH (ref <18)

## 2020-02-08 LAB — TSH: TSH: 1.666 u[IU]/mL (ref 0.350–4.500)

## 2020-02-08 LAB — CORTISOL: Cortisol, Plasma: 13.5 ug/dL

## 2020-02-08 LAB — SODIUM, URINE, RANDOM: Sodium, Ur: 85 mmol/L

## 2020-02-08 LAB — OSMOLALITY, URINE: Osmolality, Ur: 665 mOsm/kg (ref 300–900)

## 2020-02-08 MED ORDER — VITAMIN B-12 1000 MCG PO TABS
500.0000 ug | ORAL_TABLET | ORAL | Status: DC
  Administered 2020-02-08 – 2020-02-15 (×5): 500 ug via ORAL
  Filled 2020-02-08 (×6): qty 1

## 2020-02-08 MED ORDER — ASPIRIN-DIPYRIDAMOLE ER 25-200 MG PO CP12
1.0000 | ORAL_CAPSULE | Freq: Two times a day (BID) | ORAL | Status: DC
  Administered 2020-02-08 – 2020-02-22 (×29): 1 via ORAL
  Filled 2020-02-08 (×31): qty 1

## 2020-02-08 MED ORDER — PRAVASTATIN SODIUM 10 MG PO TABS
20.0000 mg | ORAL_TABLET | Freq: Every day | ORAL | Status: DC
  Administered 2020-02-08 – 2020-02-22 (×15): 20 mg via ORAL
  Filled 2020-02-08 (×15): qty 2

## 2020-02-08 MED ORDER — ENOXAPARIN SODIUM 40 MG/0.4ML ~~LOC~~ SOLN
40.0000 mg | SUBCUTANEOUS | Status: DC
  Administered 2020-02-08 – 2020-02-15 (×8): 40 mg via SUBCUTANEOUS
  Filled 2020-02-08 (×8): qty 0.4

## 2020-02-08 MED ORDER — ATOMOXETINE HCL 10 MG PO CAPS
20.0000 mg | ORAL_CAPSULE | Freq: Two times a day (BID) | ORAL | Status: DC
  Administered 2020-02-08 (×2): 20 mg via ORAL
  Filled 2020-02-08 (×3): qty 2

## 2020-02-08 MED ORDER — ACETAMINOPHEN 325 MG PO TABS: 650.0000 mg | ORAL_TABLET | Freq: Four times a day (QID) | ORAL | Status: DC | PRN

## 2020-02-08 MED ORDER — SODIUM CHLORIDE 0.9 % IV SOLN
500.0000 mL | Freq: Once | INTRAVENOUS | Status: AC
  Administered 2020-02-08: 500 mL via INTRAVENOUS

## 2020-02-08 MED ORDER — ACETAMINOPHEN 650 MG RE SUPP: 650.0000 mg | Freq: Four times a day (QID) | RECTAL | Status: DC | PRN

## 2020-02-08 NOTE — Progress Notes (Signed)
  Echocardiogram 2D Echocardiogram has been performed.  Adam Clements 02/08/2020, 4:14 PM

## 2020-02-08 NOTE — Progress Notes (Signed)
Attempted Orthostatic vs on patient with Adam Clements,NT. Pt was not able to stand up for RN/NT to get BP and Pulse. Immediately upon standing pt had to be lowered down.

## 2020-02-08 NOTE — ED Notes (Signed)
Pt sitting up eating lunch. Denies any problems or concerns.

## 2020-02-08 NOTE — ED Notes (Signed)
Repositioned patient in bed. Reapplied cardiac leads. Pt denies any problems at this time.

## 2020-02-08 NOTE — ED Notes (Signed)
Pt sitting up in bed eating breakfast. Denies any complaints at this time. Urinal at bedside for UA collection.

## 2020-02-08 NOTE — Care Plan (Signed)
This 85 y.o. male with history of neurogenic orthostatic hypotension, nonischemic cardiomyopathy last EF measured was 35 to 40% with grade 2 diastolic dysfunction, supine hypertension, TIA, chronic anemia, chronic hyponatremia, chronic kidney disease stage III was brought to the ER after patient had at least 2 episodes of syncope yesterday.  He was recently started on Strattera by patient's cardiologist for autonomic orthostatic hypotension. Patient is admitted for syncope.  MRI head unremarkable.  Patient has chronic hyponatremia. PT and OT evaluation ordered for possible nursing home placement. Patient was seen and examined,  he denies any chest pain or shortness of breath.

## 2020-02-08 NOTE — Consult Note (Signed)
Cardiology Consultation:   Patient ID: Adam Clements MRN: [id number]; DOB: 26-Feb-1935  Admit date: 02/07/2020 Date of Consult: 02/08/2020  Primary Care Provider: Ria Bush, MD [facility] Cardiologist: Virl Axe, MD  [place] Electrophysiologist:  Virl Axe, MD    Patient Profile:   Adam Clements is a 85 y.o. male with a hx of orthostatic hypotension with + tilt test at [facility] in 2019, HTN, non ischemic cardiomyopathy, carotid stenosis hx TIA, on DAPT who is being seen today for the evaluation of syncope X 2 yesterday 6-8 in last few months at the request of Dr. Dwyane Dee.  History of Present Illness:   Adam Clements with above hx and followed by Dr. Caryl Comes last seen 08/2019 at that time drop of 130 mm with HR change in 8 bpm.  He has been evaluated for amyloid but neg.  borderline PYP ; myeloma panel>>M prot 0.3  FLC assay neg    IFE urine neg  Dr. Caryl Comes had discussed with Dr Gaylan Gerold.    He has chronic RBBB.   He also continues with supine systolic HTN.  Low dose hydralazine at bedtime.  He was on Northera and was expensive.   In late June Northera was stopped and pt began Strattera. He had improved orthostatic hypotension on the strattera  With plan for 20 mg BID.      Last echo 10/19/17 and EF 35-40% mild LVH diffuse hypokinesis G2DD mild MR, LA was moderately dilated.  Mildly dilated aortic root.   BICCA stenosis 1-39%   Pt admitted 02/07/20 after presenting 7 episodes of syncope.  One getting up from bed, the day before he did have scalp laceration.    No orthostatic BP checks   Na 125, K+ 3.7, BUN 36 Cr 1.64 (priro Cr 1.54 to 1.48) Ca 8.5 Albumin 3.2 TP 5.9  HS troponin 50, 50, 61  Hgb 10.8 plts 211  EKG:  The EKG was personally reviewed and demonstrates:  SR with RBBB at 106 and LVH, no acute ST changes. Telemetry:  Telemetry was personally reviewed and demonstrates:  SR CT head and C-spine unremarkable, old CVA and some laceration.   Pt rec'd 1 L of  IV fluids.   Currently resting without complaints, no dizziness going from lying back to sitting upright. No chest pain.   Past Medical History:  Diagnosis Date  . Allergic rhinitis   . Arthritis    Gout  . BPH (benign prostatic hypertrophy)   . Bradycardia   . CAD (coronary artery disease)    nonobstructive  . Chronic anemia 2013   h/o IDA, did not tolerate oral iron, latest normal iron studies, B12, folate  . Diverticulosis of colon    conoscopy 12/1993 (Dr. Amedeo Plenty) S/G divertics//colonoscopy L&R divertics (Dr. Amedeo Plenty) 11/08/2004  . Dizziness   . Gout   . HTN (hypertension)   . Memory loss   . Mitral regurgitation    Pulmonic regurgitation, with marked LA enlargement  . Nonischemic cardiomyopathy (HCC)    EF 40%  . Orthostatic hypotension   . PVC (premature ventricular contraction)   . PVD (peripheral vascular disease) ([place])    followed by Dr Trula Slade, treating medically (pletal and aspirin)  . RBBB longstanding  . Squamous cell carcinoma in situ of skin 2015   L ear  . Syncope     Past Surgical History:  Procedure Laterality Date  . 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 APPENDECTOMY    . dexa  2004   "  shrunk 2 inches", WNL     Home Medications:  Prior to Admission medications   Medication Sig Start Date End Date Taking? Authorizing Provider  atomoxetine (STRATTERA) 10 MG capsule Take 2 capsules (20 mg total) by mouth 2 (two) times daily. 02/02/20  Yes Deboraha Sprang, MD  cholecalciferol (VITAMIN D) 1000 units tablet Take 1,000 Units by mouth daily.   Yes [provider]  dipyridamole-aspirin (AGGRENOX) 200-25 MG 12hr capsule TAKE 1 CAPSULE BY MOUTH TWICE A DAY Patient taking differently: Take 1 capsule by mouth 2 (two) times daily.  10/13/19  Yes Ria Bush, MD  pravastatin (PRAVACHOL) 20 MG tablet TAKE 1 TABLET(20 MG) BY MOUTH DAILY Patient taking differently: Take 20 mg by mouth daily.  11/15/19  Yes Ria Bush, MD  vitamin B-12  (CYANOCOBALAMIN) 500 MCG tablet Take 1 tablet (500 mcg total) by mouth every Monday, Wednesday, and Friday. 01/11/20  Yes Ria Bush, MD    Inpatient Medications: Scheduled Meds: . atomoxetine  20 mg Oral BID  . dipyridamole-aspirin  1 capsule Oral BID  . enoxaparin (LOVENOX) injection  40 mg Subcutaneous Q24H  . pravastatin  20 mg Oral q1800  . vitamin B-12  500 mcg Oral Q M,W,F   Continuous Infusions:  PRN Meds: acetaminophen **OR** acetaminophen  Allergies:    Allergies  Allergen Reactions  . Atorvastatin Other (See Comments)    Memory trouble, confusion, fatigue  . Pletal [Cilostazol]     Social History:   Social History   Socioeconomic History  . Marital status: Married    Spouse name: Not on file  . Number of children: Not on file  . Years of education: Not on file  . Highest education level: Not on file  Occupational History  . Not on file  Tobacco Use  . Smoking status: Former Smoker    Packs/day: 2.00    Types: Cigarettes    Quit date: 11/10/1982    Years since quitting: 37.2  . Smokeless tobacco: Former Network engineer  . Vaping Use: Never used  Substance and Sexual Activity  . Alcohol use: Yes    Alcohol/week: 7.0 standard drinks    Types: 7 Glasses of wine per week    Comment: 1 glass of wine daily. History of heavy drinking prior to 2005.  . Drug use: No  . Sexual activity: Never  Other Topics Concern  . Not on file  Social History Narrative   Lives with wife [name]). Dog passed away 12-12-2012.   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 HCPOA is wife then son and then oldest daughter etc. Advanced directives in chart Dec 12, 2012). Does not want artificial nutrition/hydration or prolonged life support        Does have stairs in home. BS Degree   Social Determinants of Health   Financial Resource Strain:   . Difficulty of Paying Living Expenses: Not on file  Food Insecurity:   . Worried About Charity fundraiser in the Last Year: Not on file  . Ran Out of Food in the Last Year: Not on file  Transportation Needs:   . Lack of Transportation (Medical): Not on file  . Lack of Transportation (Non-Medical): Not on file  Physical Activity:   . Days of  Exercise per Week: Not on file  . Minutes of Exercise per Session: Not on file  Stress:   . Feeling of Stress : Not on file  Social Connections:   . Frequency of Communication with Friends and Family: Not on file  . Frequency of Social Gatherings with Friends and Family: Not on file  . Attends Religious Services: Not on file  . Active Member of Clubs or Organizations: Not on file  . Attends Archivist Meetings: Not on file  . Marital Status: Not on file  Intimate Partner Violence:   . Fear of Current or Ex-Partner: Not on file  . Emotionally Abused: Not on file  . Physically Abused: Not on file  . Sexually Abused: Not on file    Family History:    Family History  Problem Relation Age of Onset  . Heart disease Mother 56       Natural causes with CHF  . Hypertension Mother   . Hypertension Father   . Stroke Father        cerebral hemorrhage  . Hyperlipidemia Brother   . Heart disease Brother        before age 56  . Hypertension Brother   . Heart attack Brother   . Hypertension Other      ROS:  Please see the history of present illness.  General:no colds or fevers, no weight changes Skin:no rashes or ulcers HEENT:no blurred vision, no congestion CV:see HPI PUL:see HPI GI:no diarrhea constipation or melena, no indigestion GU:no hematuria, no dysuria MS:no joint pain, no claudication Neuro:no syncope, no lightheadedness Endo:no diabetes, no thyroid disease  All other ROS reviewed and negative.     Physical  Exam/Data:   Vitals:   02/08/20 1102 02/08/20 1157 02/08/20 1200 02/08/20 1252  BP: 133/87 133/87 (!) 158/87 (!) 166/99  Pulse: 69 64 66 76  Resp: 18 12 14 19   Temp:      TempSrc:      SpO2: 96% 96% 96% 93%   No intake or output data in the 24 hours ending 02/08/20 1327 Last 3 Weights 01/06/2020 10/31/2019 10/11/2019  Weight (lbs) 145 lb 9 oz 145 lb 7 oz 149 lb 9.6 oz  Weight (kg) 66.027 kg 65.97 kg 67.858 kg     There is no height or weight on file to calculate BMI.  General:  Thin male, hard of hearing, in no acute distress HEENT: normal Lymph: no adenopathy Neck: no JVD Endocrine:  No thryomegaly Vascular: No carotid bruits; pedal pulses 2+ bilaterally without bruits  Cardiac:  normal S1, S2; RRR; no murmur gallup rub or click  Lungs:  clear to auscultation bilaterally, no wheezing, rhonchi or rales  Abd: soft, nontender, no hepatomegaly  Ext: no edema Musculoskeletal:  No deformities, BUE and BLE strength normal and equal Skin: warm and dry  Neuro:  Hard of hearing, answers questions approp, no focal abnormalities noted Psych:  Normal affect     Relevant CV Studies: Echo 10/19/17 Study Conclusions   - Left ventricle: The cavity size was normal. Wall thickness was  increased in a pattern of mild LVH. Systolic function was  moderately reduced. The estimated ejection fraction was in the  range of 35% to 40%. Diffuse hypokinesis. Features are consistent  with a pseudonormal left ventricular filling pattern, with  concomitant abnormal relaxation and increased filling pressure  (grade 2 diastolic dysfunction).  - Mitral valve: Calcified annulus. There was mild regurgitation.  - Left atrium: The atrium was moderately  dilated.  - Right ventricle: The cavity size was mildly dilated.  - Right atrium: The atrium was mildly dilated.   Impressions:   - Moderate global reduction in LV systolic function (EF 40); mild  LVH; moderate diastolic dysfunction; midly  dilated aortic root;  moderate LAE; mild MR; mild RAE and RVE.   -------------------------------------------------------------------  Study data: Comparison was made to the study of 01/02/2017. Study  status: Routine. Procedure: Transthoracic echocardiography.  Image quality was adequate.     Transthoracic  echocardiography. M-mode, complete 2D, spectral Doppler, and color  Doppler. Birthdate: Patient birthdate: 06/12/1934. Age: Patient  is 85 yr old. Sex: Gender: male.  BMI: 22.2 kg/m^2. Blood  pressure:   196/88 Patient status: Outpatient. Study date:  Study date: 10/19/2017. Study time: 03:21 PM. Location: [facility]   -------------------------------------------------------------------   -------------------------------------------------------------------  Left ventricle: The cavity size was normal. Wall thickness was  increased in a pattern of mild LVH. Systolic function was  moderately reduced. The estimated ejection fraction was in the  range of 35% to 40%. Diffuse hypokinesis. Features are consistent  with a pseudonormal left ventricular filling pattern, with  concomitant abnormal relaxation and increased filling pressure  (grade 2 diastolic dysfunction).   -------------------------------------------------------------------  Aortic valve:  Trileaflet; mildly thickened leaflets. Mobility was  not restricted. Doppler: Transvalvular velocity was within the  normal range. There was no stenosis. There was no regurgitation.    -------------------------------------------------------------------  Aorta: Aortic root: The aortic root was mildly dilated.   -------------------------------------------------------------------  Mitral valve:  Calcified annulus. Mobility was not restricted.  Doppler: Transvalvular velocity was within the normal range. There  was no evidence for stenosis. There was mild regurgitation.    -------------------------------------------------------------------  Left atrium: The atrium was moderately dilated.   -------------------------------------------------------------------  Right ventricle: The cavity size was mildly dilated. Pacer wire or  catheter noted in right ventricle. Systolic function was normal.    -------------------------------------------------------------------  Pulmonic valve:  Doppler: Transvalvular velocity was within the  normal range. There was no evidence for stenosis. There was trivial  regurgitation.   -------------------------------------------------------------------  Tricuspid valve:  Structurally normal valve.  Doppler:  Transvalvular velocity was within the normal range. There was  trivial regurgitation.   -------------------------------------------------------------------  Right atrium: The atrium was mildly dilated. Pacer wire or  catheter noted in right atrium.   -------------------------------------------------------------------  Pericardium: There was no pericardial effusion.   -------------------------------------------------------------------  Systemic veins:  Inferior vena cava: The vessel was normal in size.   Cardiac Cath 2010   FINDINGS:  1. Hemodynamics:  Mean right atrial pressure 2 mmHg, RV 26/6, PA 25/18      with a mean PA pressure of 16 mmHg, mean pulmonary capillary wedge      pressure 5 mmHg, LV 148/9, aorta 136/99.  Cardiac output 5.5 L/min.      Cardiac index 3.1.  Mixed venous O2 saturation is 70%.  Aortic      saturation 94%.  2. Left ventriculography:  The EF was noted to be 40-45% with mild      global hypokinesis.  Determination of EF is difficult due to      frequent PVCs.  There was mild mitral regurgitation  3. Right coronary artery:  The right coronary artery was dominant      vessel.  There was a 30-40% mid RCA stenosis.  4. Left main:  The left main coronary artery had minimal luminal       irregularities.  5. Left circumflex system:  The left circumflex had a large first      obtuse marginal and 2 other smaller obtuse marginals.  There was      about a 30% midvessel stenosis in the large first obtuse marginal.  6. Left anterior descending system:  The LAD is a large vessel.  There      is significant calcification in the proximal vessel, but minimal 10-      20% angiographic stenosis.  There was a large first diagonal with      no significant disease.  Just beyond the first diagonal of the      proximal LAD, there is about a 30% stenosis.  The remainder of the      LAD had only luminal irregularities.   ASSESSMENT/PLAN:  This is a 85 year old with frequent premature  ventricular contractions and mildly decreased systolic function measured  40-45% by left ventriculography and 40% by echo.  He has no obstructive  coronary artery disease, therefore his cardiomyopathy is likely  nonischemic.  His right and left heart filling pressures are normal.  It  may be that his cardiomyopathy results from his very frequent PVCs and  bigeminy.   Laboratory Data:  High Sensitivity Troponin:   Recent Labs  Lab 02/07/20 2049 02/07/20 2233 02/08/20 0503  TROPONINIHS 50* 50* 61*     Chemistry Recent Labs  Lab 02/07/20 2049 02/07/20 2049 02/08/20 0245 02/08/20 0503 02/08/20 0821  NA 125*  --   --  129* 128*  K 3.7  --   --  3.5 4.0  CL 93*  --   --  102 98  CO2 22  --   --  20* 21*  GLUCOSE 128*  --   --  92 125*  BUN 36*  --   --  33* 33*  CREATININE 1.64*   < > 1.45* 1.26* 1.33*  CALCIUM 8.5*  --   --  7.8* 8.7*  GFRNONAA 41*   < > 48* 57* 53*  ANIONGAP 10  --   --  7 9   < > = values in this interval not displayed.    Recent Labs  Lab 02/07/20 2049  PROT 5.9*  ALBUMIN 3.2*  AST 20  ALT 13  ALKPHOS 48  BILITOT 0.6   Hematology Recent Labs  Lab 02/07/20 2049 02/08/20 0245  WBC 6.2 5.2  RBC 3.39* 3.10*  HGB 10.8* 10.0*  HCT 31.8* 28.9*  MCV 93.8 93.2    MCH 31.9 32.3  MCHC 34.0 34.6  RDW 11.9 11.9  PLT 209 211   BNPNo results for input(s): BNP, PROBNP in the last 168 hours.  DDimer No results for input(s): DDIMER in the last 168 hours.   Radiology/Studies:  DG Chest 2 View  Result Date: 02/07/2020 CLINICAL DATA:  Multiple falls EXAM: CHEST - 2 VIEW COMPARISON:  2017 FINDINGS: Increased top-normal heart size. Chronic interstitial prominence. No pleural effusion. No acute osseous abnormality. IMPRESSION: Chronic interstitial changes.  Increased top-normal heart size. Electronically Signed   By: Macy Mis M.D.   On: 02/07/2020 21:47   DG Abd 1 View  Result Date: 02/08/2020 CLINICAL DATA:  MRI clearance EXAM: ABDOMEN - 1 VIEW COMPARISON:  None. FINDINGS: Normal abdominal gas pattern. Several surgical clips are noted within the right lower quadrant. No unexpected metallic foreign body within the abdomen. Vascular calcifications are seen within the pelvis. Degenerative changes are seen within the lumbar spine. IMPRESSION: No unexpected metallic foreign body within the abdomen. Electronically Signed  By: Fidela Salisbury MD   On: 02/08/2020 05:00   CT Head Wo Contrast  Result Date: 02/07/2020 CLINICAL DATA:  Multiple falls, orthostatic, normocytic anemia EXAM: CT HEAD WITHOUT CONTRAST CT CERVICAL SPINE WITHOUT CONTRAST TECHNIQUE: Multidetector CT imaging of the head and cervical spine was performed following the standard protocol without intravenous contrast. Multiplanar CT image reconstructions of the cervical spine were also generated. COMPARISON:  CT 08/26/2017, MRI 02/22/2016 FINDINGS: CT HEAD FINDINGS Brain: Scattered hypoattenuating foci in the bilateral basal ganglia and external capsule likely reflect areas of remote lacunar type infarcts. No evidence of acute infarction, hemorrhage, hydrocephalus, extra-axial collection, visible mass lesion or mass effect. Symmetric prominence of the ventricles, cisterns and sulci compatible with  parenchymal volume loss. Patchy areas of white matter hypoattenuation are most compatible with chronic microvascular angiopathy. Vascular: Atherosclerotic calcification of the carotid siphons and intradural vertebral arteries. No hyperdense vessel. Skull: Some mild right temporoparietal and left temporal scalp thickening. Additional thickening and possible laceration of the left frontal scalp as well. No calvarial fracture or acute osseous injury is identified. Sinuses/Orbits: Paranasal sinuses and mastoid air cells are predominantly clear. Orbital structures are unremarkable aside from prior lens extractions. Other: None. CT CERVICAL SPINE FINDINGS Alignment: Cervical stabilization collar is in place at the time of examination. Mild straightening of normal cervical lordosis, possibly positional, degenerative or related to muscle spasm. No evidence of traumatic listhesis. No abnormally widened, perched or jumped facets. Normal alignment of the craniocervical and atlantoaxial articulations. Skull base and vertebrae: No acute skull base fracture. No vertebral body fracture or height loss. Normal bone mineralization. No worrisome osseous lesions. Moderate arthrosis at the atlantodental interval. Cervical spondylitic changes, detailed below. Soft tissues and spinal canal: No pre or paravertebral fluid or swelling. No visible canal hematoma. Disc levels: Multilevel intervertebral disc height loss with spondylitic endplate changes. Multilevel disc osteophyte complexes efface the ventral thecal sac without significant resulting spinal canal stenosis. Multilevel uncinate spurring and facet degenerative changes which are most pronounced on the left at C3-4 and on the right at C2-3 where more moderate foraminal impingement is present. More mild multilevel foraminal narrowing is seen throughout the remaining cervical levels. Upper chest: Extensive biapical pleuroparenchymal scarring is noted with some apical emphysematous  changes as well. Cervical carotid atherosclerosis is present. Other: No concerning thyroid nodules or masses. IMPRESSION: 1. No acute intracranial abnormality. 2. Some mild right temporoparietal and left temporal scalp thickening. Additional thickening and possible laceration of the left frontal scalp as well. No calvarial fracture or acute osseous injury is identified. 3. Chronic microvascular ischemic changes, parenchymal volume loss and intracranial atherosclerosis. 4. Punctate hypoattenuating foci in the bilateral basal ganglia and external capsules compatible with prior lacunar infarcts. 5. No evidence of acute fracture or traumatic listhesis of the cervical spine. 6. Cervical spondylitic and facet degenerative changes, as above. 7. Cervical and intracranial atherosclerosis. Electronically Signed   By: Lovena Le M.D.   On: 02/07/2020 22:05   CT Cervical Spine Wo Contrast  Result Date: 02/07/2020 CLINICAL DATA:  Multiple falls, orthostatic, normocytic anemia EXAM: CT HEAD WITHOUT CONTRAST CT CERVICAL SPINE WITHOUT CONTRAST TECHNIQUE: Multidetector CT imaging of the head and cervical spine was performed following the standard protocol without intravenous contrast. Multiplanar CT image reconstructions of the cervical spine were also generated. COMPARISON:  CT 08/26/2017, MRI 02/22/2016 FINDINGS: CT HEAD FINDINGS Brain: Scattered hypoattenuating foci in the bilateral basal ganglia and external capsule likely reflect areas of remote lacunar type infarcts. No evidence of acute  infarction, hemorrhage, hydrocephalus, extra-axial collection, visible mass lesion or mass effect. Symmetric prominence of the ventricles, cisterns and sulci compatible with parenchymal volume loss. Patchy areas of white matter hypoattenuation are most compatible with chronic microvascular angiopathy. Vascular: Atherosclerotic calcification of the carotid siphons and intradural vertebral arteries. No hyperdense vessel. Skull: Some mild  right temporoparietal and left temporal scalp thickening. Additional thickening and possible laceration of the left frontal scalp as well. No calvarial fracture or acute osseous injury is identified. Sinuses/Orbits: Paranasal sinuses and mastoid air cells are predominantly clear. Orbital structures are unremarkable aside from prior lens extractions. Other: None. CT CERVICAL SPINE FINDINGS Alignment: Cervical stabilization collar is in place at the time of examination. Mild straightening of normal cervical lordosis, possibly positional, degenerative or related to muscle spasm. No evidence of traumatic listhesis. No abnormally widened, perched or jumped facets. Normal alignment of the craniocervical and atlantoaxial articulations. Skull base and vertebrae: No acute skull base fracture. No vertebral body fracture or height loss. Normal bone mineralization. No worrisome osseous lesions. Moderate arthrosis at the atlantodental interval. Cervical spondylitic changes, detailed below. Soft tissues and spinal canal: No pre or paravertebral fluid or swelling. No visible canal hematoma. Disc levels: Multilevel intervertebral disc height loss with spondylitic endplate changes. Multilevel disc osteophyte complexes efface the ventral thecal sac without significant resulting spinal canal stenosis. Multilevel uncinate spurring and facet degenerative changes which are most pronounced on the left at C3-4 and on the right at C2-3 where more moderate foraminal impingement is present. More mild multilevel foraminal narrowing is seen throughout the remaining cervical levels. Upper chest: Extensive biapical pleuroparenchymal scarring is noted with some apical emphysematous changes as well. Cervical carotid atherosclerosis is present. Other: No concerning thyroid nodules or masses. IMPRESSION: 1. No acute intracranial abnormality. 2. Some mild right temporoparietal and left temporal scalp thickening. Additional thickening and possible  laceration of the left frontal scalp as well. No calvarial fracture or acute osseous injury is identified. 3. Chronic microvascular ischemic changes, parenchymal volume loss and intracranial atherosclerosis. 4. Punctate hypoattenuating foci in the bilateral basal ganglia and external capsules compatible with prior lacunar infarcts. 5. No evidence of acute fracture or traumatic listhesis of the cervical spine. 6. Cervical spondylitic and facet degenerative changes, as above. 7. Cervical and intracranial atherosclerosis. Electronically Signed   By: Lovena Le M.D.   On: 02/07/2020 22:05   MR BRAIN WO CONTRAST  Result Date: 02/08/2020 CLINICAL DATA:  Neuro deficit with acute stroke suspected. EXAM: MRI HEAD WITHOUT CONTRAST TECHNIQUE: Multiplanar, multiecho pulse sequences of the brain and surrounding structures were obtained without intravenous contrast. COMPARISON:  Head CT from yesterday FINDINGS: Brain: Prominent generalized cortical atrophy. No acute infarct, hemorrhage, hydrocephalus, or collection. No masslike finding. Mild chronic white matter disease for age. Vascular: Preserved flow voids Skull and upper cervical spine: No focal marrow lesion. Facet osteoarthritis with notable spurring on the right at C2-3. Sinuses/Orbits: Bilateral cataract resection. Opacified left frontal sinus without fluid level IMPRESSION: 1. No acute or reversible finding. 2. Generalized cortical atrophy. Electronically Signed   By: Monte Fantasia M.D.   On: 02/08/2020 06:32     Assessment and Plan:   1. Syncope with hx of autonomic orthostatic hypotension now on Strattera since July with improved BP.   Cortisol plasma 13.5  TSH 1.666 have ordered orthostatic BPs.   2. Supine hypertension on low dose hydralazine.  3. Hyponatremia did have one saline bolus.   4. NICM with EF in 2019 of 35-40%  5. Chronic  anemia 6. Hx of TIA and aggrenox  7. HLD on pravachol. LDL 72 09/2019                  For questions or  updates, please contact [place] Please consult [email or web address] for contact info under    Signed, Cecilie Kicks, NP  02/08/2020 1:27 PM

## 2020-02-08 NOTE — ED Notes (Signed)
Ordered breakfast 

## 2020-02-08 NOTE — H&P (Signed)
History and Physical    Adam Clements ZOX:[id number] DOB: Mar 16, 1935 DOA: 02/07/2020  PCP: Ria Bush, MD  Patient coming from: Home.  Chief Complaint: Loss of consciousness.  HPI: Adam Clements is a 85 y.o. male with history of neurogenic orthostatic hypotension, nonischemic cardiomyopathy last EF measured was 35 to 40% with grade 2 diastolic dysfunction, supine hypertension, TIA, chronic anemia, chronic hyponatremia, chronic kidney disease stage III was brought to the ER after patient had at least 2 episodes of syncope yesterday.  Patient states patient also had additional episodes last few days.  Patient states the first episode yesterday was when he was trying to go to the bathroom in the morning.  He tried to get up from the bed he lost consciousness but does not remember how long he lost consciousness.  Then later in the evening he had another episode and at this point he decided to come to the ER.  A day ago when he had loss of consciousness he did hit his head.  Denies any nausea vomiting diarrhea chest pain shortness of breath or any weakness of the extremities except for generalized weakness.  Was recently started on Strattera by patient's cardiologist for autonomic orthostatic hypotension.  ED Course: In the ER patient's labs are significant for creatinine 1.6 which increased from 1.4 sodium 125 CBC shows hemoglobin at baseline.  EKG shows normal sinus rhythm RBBB.  CT head C-spine was unremarkable except for old strokes and some laceration..  Which was difficult to obtain orthostatic blood pressure changes due to patient significantly unsteady on trying to get up.  Patient admitted for further observation.  Was given 1 L fluid bolus by the ER physician in the ER.  High sensitive troponin was 50 and the repeat 1 was 50.  Review of Systems: As per HPI, rest all negative.   Past Medical History:  Diagnosis Date  . Allergic rhinitis   . Arthritis    Gout  . BPH (benign  prostatic hypertrophy)   . Bradycardia   . CAD (coronary artery disease)    nonobstructive  . Chronic anemia 2013   h/o IDA, did not tolerate oral iron, latest normal iron studies, B12, folate  . Diverticulosis of colon    conoscopy 12/1993 (Dr. Amedeo Plenty) S/G divertics//colonoscopy L&R divertics (Dr. Amedeo Plenty) 11/08/2004  . Dizziness   . Gout   . HTN (hypertension)   . Memory loss   . Mitral regurgitation    Pulmonic regurgitation, with marked LA enlargement  . Nonischemic cardiomyopathy ([facility])    EF 40%  . Orthostatic hypotension   . PVC (premature ventricular contraction)   . PVD (peripheral vascular disease) ([place])    followed by Dr Trula Slade, treating medically (pletal and aspirin)  . RBBB longstanding  . Squamous cell carcinoma in situ of skin 2015   L ear  . Syncope     Past Surgical History:  Procedure Laterality Date  . 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 APPENDECTOMY    . dexa  2004   "shrunk 2 inches", WNL     reports that he quit smoking about 37 years ago. His smoking use included cigarettes. He smoked 2.00 packs per day. He has quit using smokeless tobacco. He reports current alcohol use of about 7.0 standard drinks of alcohol per week. He reports that he does not use drugs.  Allergies  Allergen Reactions  . Atorvastatin Other (See Comments)    Memory trouble, confusion, fatigue  . Pletal [Cilostazol]  Family History  Problem Relation Age of Onset  . Heart disease Mother 14       Natural causes with CHF  . Hypertension Mother   . Hypertension Father   . Stroke Father        cerebral hemorrhage  . Hyperlipidemia Brother   . Heart disease Brother        before age 97  . Hypertension Brother   . Heart attack Brother   . Hypertension Other     Prior to Admission medications   Medication Sig Start Date End Date Taking? Authorizing Provider  atomoxetine (STRATTERA) 10 MG capsule Take 2 capsules (20 mg total) by mouth 2 (two) times daily. 02/02/20   Yes Deboraha Sprang, MD  cholecalciferol (VITAMIN D) 1000 units tablet Take 1,000 Units by mouth daily.   Yes [provider]  dipyridamole-aspirin (AGGRENOX) 200-25 MG 12hr capsule TAKE 1 CAPSULE BY MOUTH TWICE A DAY Patient taking differently: Take 1 capsule by mouth 2 (two) times daily.  10/13/19  Yes Ria Bush, MD  pravastatin (PRAVACHOL) 20 MG tablet TAKE 1 TABLET(20 MG) BY MOUTH DAILY Patient taking differently: Take 20 mg by mouth daily.  11/15/19  Yes Ria Bush, MD  vitamin B-12 (CYANOCOBALAMIN) 500 MCG tablet Take 1 tablet (500 mcg total) by mouth every Monday, Wednesday, and Friday. 01/11/20  Yes Ria Bush, MD    Physical Exam: Constitutional: Moderately built and nourished. Vitals:   02/07/20 2301 02/08/20 0019 02/08/20 0051 02/08/20 0100  BP: (!) 151/118 (!) 165/108 (!) 189/96 (!) 199/135  Pulse: 75 72 65 65  Resp: 17 16 11 14   Temp:      TempSrc:      SpO2: 99% 96% 96% 99%   Eyes: Anicteric no pallor. ENMT: No discharge from the ears eyes nose or mouth. Neck: No mass felt.  No neck rigidity. Respiratory: No rhonchi or crepitations. Cardiovascular: S1-S2 heard. Abdomen: Soft nontender bowel sounds present. Musculoskeletal: No edema. Skin: No rash. Neurologic: Alert awake oriented to time place and person.  Moves all extremities. Psychiatric: Appears normal.  Normal affect.   Labs on Admission: I have personally reviewed following labs and imaging studies  CBC: Recent Labs  Lab 02/07/20 2049  WBC 6.2  NEUTROABS 4.8  HGB 10.8*  HCT 31.8*  MCV 93.8  PLT 096   Basic Metabolic Panel: Recent Labs  Lab 02/07/20 2049  NA 125*  K 3.7  CL 93*  CO2 22  GLUCOSE 128*  BUN 36*  CREATININE 1.64*  CALCIUM 8.5*   GFR: CrCl cannot be calculated (Unknown ideal weight.). Liver Function Tests: Recent Labs  Lab 02/07/20 2049  AST 20  ALT 13  ALKPHOS 48  BILITOT 0.6  PROT 5.9*  ALBUMIN 3.2*   No results for input(s): LIPASE,  AMYLASE in the last 168 hours. No results for input(s): AMMONIA in the last 168 hours. Coagulation Profile: No results for input(s): INR, PROTIME in the last 168 hours. Cardiac Enzymes: No results for input(s): CKTOTAL, CKMB, CKMBINDEX, TROPONINI in the last 168 hours. BNP (last 3 results) No results for input(s): PROBNP in the last 8760 hours. HbA1C: No results for input(s): HGBA1C in the last 72 hours. CBG: No results for input(s): GLUCAP in the last 168 hours. Lipid Profile: No results for input(s): CHOL, HDL, LDLCALC, TRIG, CHOLHDL, LDLDIRECT in the last 72 hours. Thyroid Function Tests: No results for input(s): TSH, T4TOTAL, FREET4, T3FREE, THYROIDAB in the last 72 hours. Anemia Panel: No results for input(s): VITAMINB12,  FOLATE, FERRITIN, TIBC, IRON, RETICCTPCT in the last 72 hours. Urine analysis:    Component Value Date/Time   COLORURINE YELLOW (A) 11/10/2017 1146   APPEARANCEUR HAZY (A) 11/10/2017 1146   LABSPEC 1.016 11/10/2017 1146   PHURINE 6.0 11/10/2017 1146   GLUCOSEU NEGATIVE 11/10/2017 1146   HGBUR NEGATIVE 11/10/2017 1146   HGBUR negative 01/13/2008 0845   BILIRUBINUR negative 10/31/2019 1148   KETONESUR NEGATIVE 11/10/2017 1146   PROTEINUR Negative 10/31/2019 1148   PROTEINUR NEGATIVE 11/10/2017 1146   UROBILINOGEN 0.2 10/31/2019 1148   UROBILINOGEN 0.2 02/05/2015 1550   NITRITE negative 10/31/2019 1148   NITRITE NEGATIVE 11/10/2017 1146   LEUKOCYTESUR Negative 10/31/2019 1148   Sepsis Labs: @LABRCNTIP (procalcitonin:4,lacticidven:4) ) Recent Results (from the past 240 hour(s))  Respiratory Panel by RT PCR (Flu A&B, Covid) - Nasopharyngeal Swab     Status: None   Collection Time: 02/07/20  9:15 PM   Specimen: Nasopharyngeal Swab  Result Value Ref Range Status   SARS Coronavirus 2 by RT PCR NEGATIVE NEGATIVE Final    Comment: (NOTE) SARS-CoV-2 target nucleic acids are NOT DETECTED.  The SARS-CoV-2 RNA is generally detectable in upper  respiratoy specimens during the acute phase of infection. The lowest concentration of SARS-CoV-2 viral copies this assay can detect is 131 copies/mL. A negative result does not preclude SARS-Cov-2 infection and should not be used as the sole basis for treatment or other patient management decisions. A negative result may occur with  improper specimen collection/handling, submission of specimen other than nasopharyngeal swab, presence of viral mutation(s) within the areas targeted by this assay, and inadequate number of viral copies (<131 copies/mL). A negative result must be combined with clinical observations, patient history, and epidemiological information. The expected result is Negative.  Fact Sheet for Patients:  PinkCheek.be  Fact Sheet for Healthcare Providers:  GravelBags.it  This test is no t yet approved or cleared by the [place] FDA and  has been authorized for detection and/or diagnosis of SARS-CoV-2 by FDA under an Emergency Use Authorization (EUA). This EUA will remain  in effect (meaning this test can be used) for the duration of the COVID-19 declaration under Section 564(b)(1) of the Act, 21 U.S.C. section 360bbb-3(b)(1), unless the authorization is terminated or revoked sooner.     Influenza A by PCR NEGATIVE NEGATIVE Final   Influenza B by PCR NEGATIVE NEGATIVE Final    Comment: (NOTE) The Xpert Xpress SARS-CoV-2/FLU/RSV assay is intended as an aid in  the diagnosis of influenza from Nasopharyngeal swab specimens and  should not be used as a sole basis for treatment. Nasal washings and  aspirates are unacceptable for Xpert Xpress SARS-CoV-2/FLU/RSV  testing.  Fact Sheet for Patients: PinkCheek.be  Fact Sheet for Healthcare Providers: GravelBags.it  This test is not yet approved or cleared by the [place] FDA and  has been  authorized for detection and/or diagnosis of SARS-CoV-2 by  FDA under an Emergency Use Authorization (EUA). This EUA will remain  in effect (meaning this test can be used) for the duration of the  Covid-19 declaration under Section 564(b)(1) of the Act, 21  U.S.C. section 360bbb-3(b)(1), unless the authorization is  terminated or revoked. Performed at [facility], [address], [place], [place] [address]      Radiological Exams on Admission: DG Chest 2 View  Result Date: 02/07/2020 CLINICAL DATA:  Multiple falls EXAM: CHEST - 2 VIEW COMPARISON:  2017 FINDINGS: Increased top-normal heart size. Chronic interstitial prominence. No pleural effusion. No  acute osseous abnormality. IMPRESSION: Chronic interstitial changes.  Increased top-normal heart size. Electronically Signed   By: Macy Mis M.D.   On: 02/07/2020 21:47   CT Head Wo Contrast  Result Date: 02/07/2020 CLINICAL DATA:  Multiple falls, orthostatic, normocytic anemia EXAM: CT HEAD WITHOUT CONTRAST CT CERVICAL SPINE WITHOUT CONTRAST TECHNIQUE: Multidetector CT imaging of the head and cervical spine was performed following the standard protocol without intravenous contrast. Multiplanar CT image reconstructions of the cervical spine were also generated. COMPARISON:  CT 08/26/2017, MRI 02/22/2016 FINDINGS: CT HEAD FINDINGS Brain: Scattered hypoattenuating foci in the bilateral basal ganglia and external capsule likely reflect areas of remote lacunar type infarcts. No evidence of acute infarction, hemorrhage, hydrocephalus, extra-axial collection, visible mass lesion or mass effect. Symmetric prominence of the ventricles, cisterns and sulci compatible with parenchymal volume loss. Patchy areas of white matter hypoattenuation are most compatible with chronic microvascular angiopathy. Vascular: Atherosclerotic calcification of the carotid siphons and intradural vertebral arteries. No hyperdense vessel. Skull: Some mild right  temporoparietal and left temporal scalp thickening. Additional thickening and possible laceration of the left frontal scalp as well. No calvarial fracture or acute osseous injury is identified. Sinuses/Orbits: Paranasal sinuses and mastoid air cells are predominantly clear. Orbital structures are unremarkable aside from prior lens extractions. Other: None. CT CERVICAL SPINE FINDINGS Alignment: Cervical stabilization collar is in place at the time of examination. Mild straightening of normal cervical lordosis, possibly positional, degenerative or related to muscle spasm. No evidence of traumatic listhesis. No abnormally widened, perched or jumped facets. Normal alignment of the craniocervical and atlantoaxial articulations. Skull base and vertebrae: No acute skull base fracture. No vertebral body fracture or height loss. Normal bone mineralization. No worrisome osseous lesions. Moderate arthrosis at the atlantodental interval. Cervical spondylitic changes, detailed below. Soft tissues and spinal canal: No pre or paravertebral fluid or swelling. No visible canal hematoma. Disc levels: Multilevel intervertebral disc height loss with spondylitic endplate changes. Multilevel disc osteophyte complexes efface the ventral thecal sac without significant resulting spinal canal stenosis. Multilevel uncinate spurring and facet degenerative changes which are most pronounced on the left at C3-4 and on the right at C2-3 where more moderate foraminal impingement is present. More mild multilevel foraminal narrowing is seen throughout the remaining cervical levels. Upper chest: Extensive biapical pleuroparenchymal scarring is noted with some apical emphysematous changes as well. Cervical carotid atherosclerosis is present. Other: No concerning thyroid nodules or masses. IMPRESSION: 1. No acute intracranial abnormality. 2. Some mild right temporoparietal and left temporal scalp thickening. Additional thickening and possible laceration  of the left frontal scalp as well. No calvarial fracture or acute osseous injury is identified. 3. Chronic microvascular ischemic changes, parenchymal volume loss and intracranial atherosclerosis. 4. Punctate hypoattenuating foci in the bilateral basal ganglia and external capsules compatible with prior lacunar infarcts. 5. No evidence of acute fracture or traumatic listhesis of the cervical spine. 6. Cervical spondylitic and facet degenerative changes, as above. 7. Cervical and intracranial atherosclerosis. Electronically Signed   By: Lovena Le M.D.   On: 02/07/2020 22:05   CT Cervical Spine Wo Contrast  Result Date: 02/07/2020 CLINICAL DATA:  Multiple falls, orthostatic, normocytic anemia EXAM: CT HEAD WITHOUT CONTRAST CT CERVICAL SPINE WITHOUT CONTRAST TECHNIQUE: Multidetector CT imaging of the head and cervical spine was performed following the standard protocol without intravenous contrast. Multiplanar CT image reconstructions of the cervical spine were also generated. COMPARISON:  CT 08/26/2017, MRI 02/22/2016 FINDINGS: CT HEAD FINDINGS Brain: Scattered hypoattenuating foci in the bilateral basal  ganglia and external capsule likely reflect areas of remote lacunar type infarcts. No evidence of acute infarction, hemorrhage, hydrocephalus, extra-axial collection, visible mass lesion or mass effect. Symmetric prominence of the ventricles, cisterns and sulci compatible with parenchymal volume loss. Patchy areas of white matter hypoattenuation are most compatible with chronic microvascular angiopathy. Vascular: Atherosclerotic calcification of the carotid siphons and intradural vertebral arteries. No hyperdense vessel. Skull: Some mild right temporoparietal and left temporal scalp thickening. Additional thickening and possible laceration of the left frontal scalp as well. No calvarial fracture or acute osseous injury is identified. Sinuses/Orbits: Paranasal sinuses and mastoid air cells are predominantly  clear. Orbital structures are unremarkable aside from prior lens extractions. Other: None. CT CERVICAL SPINE FINDINGS Alignment: Cervical stabilization collar is in place at the time of examination. Mild straightening of normal cervical lordosis, possibly positional, degenerative or related to muscle spasm. No evidence of traumatic listhesis. No abnormally widened, perched or jumped facets. Normal alignment of the craniocervical and atlantoaxial articulations. Skull base and vertebrae: No acute skull base fracture. No vertebral body fracture or height loss. Normal bone mineralization. No worrisome osseous lesions. Moderate arthrosis at the atlantodental interval. Cervical spondylitic changes, detailed below. Soft tissues and spinal canal: No pre or paravertebral fluid or swelling. No visible canal hematoma. Disc levels: Multilevel intervertebral disc height loss with spondylitic endplate changes. Multilevel disc osteophyte complexes efface the ventral thecal sac without significant resulting spinal canal stenosis. Multilevel uncinate spurring and facet degenerative changes which are most pronounced on the left at C3-4 and on the right at C2-3 where more moderate foraminal impingement is present. More mild multilevel foraminal narrowing is seen throughout the remaining cervical levels. Upper chest: Extensive biapical pleuroparenchymal scarring is noted with some apical emphysematous changes as well. Cervical carotid atherosclerosis is present. Other: No concerning thyroid nodules or masses. IMPRESSION: 1. No acute intracranial abnormality. 2. Some mild right temporoparietal and left temporal scalp thickening. Additional thickening and possible laceration of the left frontal scalp as well. No calvarial fracture or acute osseous injury is identified. 3. Chronic microvascular ischemic changes, parenchymal volume loss and intracranial atherosclerosis. 4. Punctate hypoattenuating foci in the bilateral basal ganglia and  external capsules compatible with prior lacunar infarcts. 5. No evidence of acute fracture or traumatic listhesis of the cervical spine. 6. Cervical spondylitic and facet degenerative changes, as above. 7. Cervical and intracranial atherosclerosis. Electronically Signed   By: Lovena Le M.D.   On: 02/07/2020 22:05    EKG: Independently reviewed.  Normal sinus rhythm RBBB.  Assessment/Plan Principal Problem:   Syncope and collapse Active Problems:   NICM (nonischemic cardiomyopathy) ([place])    1. Syncope with history of autonomic orthostatic hypotension presently started on Strattera.  Given the worsening function MRI brain was ordered.  We will get physical therapy consult monitor in telemetry and will discuss with cardiology in the morning. 2. Hyponatremia appears to be chronic at this time.  Was given saline bolus.  Follow metabolic panel closely.  Check urine studies.  Check TSH cortisol. 3. History of TIA on Aggrenox and statins. 4. Chronic anemia being followed by patient's primary care physician.  Hemoglobin appears to be at baseline. 5. Supine hypertension as per patient's cardiology notes was recently started on hydralazine at bedtime.  We need to confirm the home medications. 6. Nonischemic cardiomyopathy appears compensated.   DVT prophylaxis: Lovenox. Code Status: Full code. Family Communication: We will need to discuss with patient's wife. Disposition Plan: Home. Consults called: Cardiologist. Admission status: Observation.  Rise Patience MD [facility] Pager [phone].  If 7PM-7AM, please contact night-coverage [email or web address] Password [email or web address]  02/08/2020, 1:18 AM

## 2020-02-08 NOTE — ED Notes (Signed)
Report called, nurse denies any questions. Will call pts daughter and notify her of pts new room.

## 2020-02-08 NOTE — ED Notes (Signed)
Spoke with patient's daughter, [name], on phone for up date. Daughter has requested assistance with options in caring for her father. MD aware.

## 2020-02-08 NOTE — ED Notes (Signed)
Lunch Tray Orders @ 1007. °

## 2020-02-09 ENCOUNTER — Telehealth: Payer: Self-pay | Admitting: Internal Medicine

## 2020-02-09 DIAGNOSIS — Z79899 Other long term (current) drug therapy: Secondary | ICD-10-CM | POA: Diagnosis not present

## 2020-02-09 DIAGNOSIS — I130 Hypertensive heart and chronic kidney disease with heart failure and stage 1 through stage 4 chronic kidney disease, or unspecified chronic kidney disease: Secondary | ICD-10-CM | POA: Diagnosis not present

## 2020-02-09 DIAGNOSIS — Z888 Allergy status to other drugs, medicaments and biological substances status: Secondary | ICD-10-CM | POA: Diagnosis not present

## 2020-02-09 DIAGNOSIS — Z87891 Personal history of nicotine dependence: Secondary | ICD-10-CM | POA: Diagnosis not present

## 2020-02-09 DIAGNOSIS — Z7401 Bed confinement status: Secondary | ICD-10-CM | POA: Diagnosis not present

## 2020-02-09 DIAGNOSIS — I2510 Atherosclerotic heart disease of native coronary artery without angina pectoris: Secondary | ICD-10-CM | POA: Diagnosis present

## 2020-02-09 DIAGNOSIS — R55 Syncope and collapse: Secondary | ICD-10-CM | POA: Diagnosis present

## 2020-02-09 DIAGNOSIS — Z823 Family history of stroke: Secondary | ICD-10-CM | POA: Diagnosis not present

## 2020-02-09 DIAGNOSIS — N179 Acute kidney failure, unspecified: Secondary | ICD-10-CM | POA: Diagnosis not present

## 2020-02-09 DIAGNOSIS — Z20822 Contact with and (suspected) exposure to covid-19: Secondary | ICD-10-CM | POA: Diagnosis not present

## 2020-02-09 DIAGNOSIS — I5022 Chronic systolic (congestive) heart failure: Secondary | ICD-10-CM | POA: Diagnosis not present

## 2020-02-09 DIAGNOSIS — M2550 Pain in unspecified joint: Secondary | ICD-10-CM | POA: Diagnosis not present

## 2020-02-09 DIAGNOSIS — Z86007 Personal history of in-situ neoplasm of skin: Secondary | ICD-10-CM | POA: Diagnosis not present

## 2020-02-09 DIAGNOSIS — G909 Disorder of the autonomic nervous system, unspecified: Secondary | ICD-10-CM | POA: Diagnosis not present

## 2020-02-09 DIAGNOSIS — R41841 Cognitive communication deficit: Secondary | ICD-10-CM | POA: Diagnosis not present

## 2020-02-09 DIAGNOSIS — I5042 Chronic combined systolic (congestive) and diastolic (congestive) heart failure: Secondary | ICD-10-CM | POA: Diagnosis not present

## 2020-02-09 DIAGNOSIS — S0181XA Laceration without foreign body of other part of head, initial encounter: Secondary | ICD-10-CM | POA: Diagnosis present

## 2020-02-09 DIAGNOSIS — Z781 Physical restraint status: Secondary | ICD-10-CM | POA: Diagnosis not present

## 2020-02-09 DIAGNOSIS — N183 Chronic kidney disease, stage 3 unspecified: Secondary | ICD-10-CM | POA: Diagnosis not present

## 2020-02-09 DIAGNOSIS — G903 Multi-system degeneration of the autonomic nervous system: Secondary | ICD-10-CM | POA: Diagnosis not present

## 2020-02-09 DIAGNOSIS — G4752 REM sleep behavior disorder: Secondary | ICD-10-CM | POA: Diagnosis not present

## 2020-02-09 DIAGNOSIS — W19XXXA Unspecified fall, initial encounter: Secondary | ICD-10-CM | POA: Diagnosis not present

## 2020-02-09 DIAGNOSIS — R278 Other lack of coordination: Secondary | ICD-10-CM | POA: Diagnosis not present

## 2020-02-09 DIAGNOSIS — E871 Hypo-osmolality and hyponatremia: Secondary | ICD-10-CM | POA: Diagnosis not present

## 2020-02-09 DIAGNOSIS — I951 Orthostatic hypotension: Secondary | ICD-10-CM | POA: Diagnosis not present

## 2020-02-09 DIAGNOSIS — D649 Anemia, unspecified: Secondary | ICD-10-CM | POA: Diagnosis present

## 2020-02-09 DIAGNOSIS — Z66 Do not resuscitate: Secondary | ICD-10-CM | POA: Diagnosis not present

## 2020-02-09 DIAGNOSIS — Z83438 Family history of other disorder of lipoprotein metabolism and other lipidemia: Secondary | ICD-10-CM | POA: Diagnosis not present

## 2020-02-09 DIAGNOSIS — E785 Hyperlipidemia, unspecified: Secondary | ICD-10-CM | POA: Diagnosis present

## 2020-02-09 DIAGNOSIS — Z515 Encounter for palliative care: Secondary | ICD-10-CM | POA: Diagnosis not present

## 2020-02-09 DIAGNOSIS — Z8249 Family history of ischemic heart disease and other diseases of the circulatory system: Secondary | ICD-10-CM | POA: Diagnosis not present

## 2020-02-09 DIAGNOSIS — R5381 Other malaise: Secondary | ICD-10-CM | POA: Diagnosis not present

## 2020-02-09 DIAGNOSIS — I428 Other cardiomyopathies: Secondary | ICD-10-CM | POA: Diagnosis not present

## 2020-02-09 DIAGNOSIS — Z8673 Personal history of transient ischemic attack (TIA), and cerebral infarction without residual deficits: Secondary | ICD-10-CM | POA: Diagnosis not present

## 2020-02-09 DIAGNOSIS — I739 Peripheral vascular disease, unspecified: Secondary | ICD-10-CM | POA: Diagnosis present

## 2020-02-09 DIAGNOSIS — Z7189 Other specified counseling: Secondary | ICD-10-CM | POA: Diagnosis not present

## 2020-02-09 DIAGNOSIS — R2689 Other abnormalities of gait and mobility: Secondary | ICD-10-CM | POA: Diagnosis not present

## 2020-02-09 DIAGNOSIS — R296 Repeated falls: Secondary | ICD-10-CM | POA: Diagnosis not present

## 2020-02-09 DIAGNOSIS — N1831 Chronic kidney disease, stage 3a: Secondary | ICD-10-CM | POA: Diagnosis not present

## 2020-02-09 LAB — BASIC METABOLIC PANEL
Anion gap: 6 (ref 5–15)
BUN: 28 mg/dL — ABNORMAL HIGH (ref 8–23)
CO2: 23 mmol/L (ref 22–32)
Calcium: 8.4 mg/dL — ABNORMAL LOW (ref 8.9–10.3)
Chloride: 97 mmol/L — ABNORMAL LOW (ref 98–111)
Creatinine, Ser: 1.28 mg/dL — ABNORMAL HIGH (ref 0.61–1.24)
GFR, Estimated: 56 mL/min — ABNORMAL LOW (ref >60)
Glucose, Bld: 94 mg/dL (ref 70–99)
Potassium: 4.1 mmol/L (ref 3.5–5.1)
Sodium: 126 mmol/L — ABNORMAL LOW (ref 135–145)

## 2020-02-09 LAB — CBC
HCT: 29 % — ABNORMAL LOW (ref 39.0–52.0)
Hemoglobin: 10.1 g/dL — ABNORMAL LOW (ref 13.0–17.0)
MCH: 31.6 pg (ref 26.0–34.0)
MCHC: 34.8 g/dL (ref 30.0–36.0)
MCV: 90.6 fL (ref 80.0–100.0)
Platelets: 211 10*3/uL (ref 150–400)
RBC: 3.2 MIL/uL — ABNORMAL LOW (ref 4.22–5.81)
RDW: 11.8 % (ref 11.5–15.5)
WBC: 5.6 10*3/uL (ref 4.0–10.5)
nRBC: 0 % (ref 0.0–0.2)

## 2020-02-09 LAB — PHOSPHORUS: Phosphorus: 2.5 mg/dL (ref 2.5–4.6)

## 2020-02-09 LAB — MAGNESIUM: Magnesium: 1.4 mg/dL — ABNORMAL LOW (ref 1.7–2.4)

## 2020-02-09 MED ORDER — SODIUM CHLORIDE 1 G PO TABS
1.0000 g | ORAL_TABLET | Freq: Three times a day (TID) | ORAL | Status: DC
  Administered 2020-02-09 – 2020-02-11 (×7): 1 g via ORAL
  Filled 2020-02-09 (×9): qty 1

## 2020-02-09 MED ORDER — MAGNESIUM SULFATE 2 GM/50ML IV SOLN
2.0000 g | Freq: Once | INTRAVENOUS | Status: AC
  Administered 2020-02-09: 2 g via INTRAVENOUS
  Filled 2020-02-09: qty 50

## 2020-02-09 MED ORDER — ATOMOXETINE HCL 10 MG PO CAPS
20.0000 mg | ORAL_CAPSULE | Freq: Three times a day (TID) | ORAL | Status: DC
  Administered 2020-02-09 – 2020-02-22 (×37): 20 mg via ORAL
  Filled 2020-02-09 (×44): qty 2

## 2020-02-09 MED ORDER — LABETALOL HCL 5 MG/ML IV SOLN
10.0000 mg | INTRAVENOUS | Status: DC | PRN
  Administered 2020-02-09 – 2020-02-10 (×2): 10 mg via INTRAVENOUS
  Filled 2020-02-09 (×3): qty 4

## 2020-02-09 MED ORDER — SODIUM CHLORIDE 0.9 % IV SOLN
500.0000 mL | Freq: Once | INTRAVENOUS | Status: AC
  Administered 2020-02-09: 500 mL via INTRAVENOUS

## 2020-02-09 NOTE — Progress Notes (Signed)
PROGRESS NOTE    Adam Clements  WPY:[id number] DOB: 1934/08/07 DOA: 02/07/2020 PCP: Ria Bush, MD   Brief Narrative:  This 85 y.o.malewithhistory of neurogenic orthostatic hypotension, nonischemic cardiomyopathy last EF measured was 35 to 40% with grade 2 diastolic dysfunction, supine hypertension, TIA, chronic anemia, chronic hyponatremia, chronic kidney disease stage III was brought to the ER after patient had at least 2 episodes of syncope at home.  He was recently started on Strattera by his cardiologist for autonomic orthostatic hypotension. Patient is admitted for syncope.  MRI head unremarkable.  Patient has chronic hyponatremia. PT and OT recommended nursing home placement.   Assessment & Plan:   Principal Problem:   Syncope and collapse Active Problems:   NICM (nonischemic cardiomyopathy) (HCC)   Autonomic failure   Near syncope  1. Syncope with history of autonomic orthostatic hypotension : recently started on Strattera.  Given the worsening function,  MRI brain was done which was unremarkable.   PT and OT recommended skilled nursing facility.  Continue monitoring in telemetry       Cardiology consulted recommended increasing the dose of Strattera given worsening orthostatic hypotension.  2. Hyponatremia appears to be chronic at this time.    Patient has received fluid bolus.  Follow metabolic panel closely.  Check urine studies.    TSH and cortisol normal. Start salt tablet  3. History of TIA: Continue Aggrenox and statins.  4. Chronic anemia :  being followed by patient's primary care physician.  Hemoglobin appears to be at baseline.  5. Supine hypertension as per patient's cardiology notes was recently started on hydralazine at bedtime.    6. Nonischemic cardiomyopathy: appears compensated.     DVT prophylaxis:  Lovenox Code Status:Full Family Communication: No one at bed side. Disposition Plan: Status is: Inpatient  Remains inpatient appropriate  because:Inpatient level of care appropriate due to severity of illness   Dispo: The patient is from: Home              Anticipated d/c is to: SNF              Anticipated d/c date is: 2 days              Patient currently is not medically stable to d/c.  Consultants:   Cardiology  Procedures:  None Antimicrobials:   Anti-infectives (From admission, onward)   None      Subjective: Patient was seen and examined at bedside.  No overnight events.  Patient has participated in physical therapy and patient became significantly dizzy and lightheaded.  Objective: Vitals:   02/08/20 2356 02/09/20 0537 02/09/20 0655 02/09/20 0704  BP: (!) 184/90 (!) 197/97 (!) 186/94 131/73  Pulse: 63 71 64 65  Resp: 18 18    Temp: 97.6 F (36.4 C) 97.8 F (36.6 C)    TempSrc: Oral     SpO2: 96% 97%      Intake/Output Summary (Last 24 hours) at 02/09/2020 1542 Last data filed at 02/09/2020 1521 Gross per 24 hour  Intake 855.89 ml  Output 1250 ml  Net -394.11 ml   There were no vitals filed for this visit.  Examination:  General exam: Appears calm and comfortable, lethargic but arousable.  Respiratory system: Clear to auscultation. Respiratory effort normal. Cardiovascular system: S1 & S2 heard, RRR. No JVD, murmurs, rubs, gallops or clicks. No pedal edema. Gastrointestinal system: Abdomen is nondistended, soft and nontender. No organomegaly or masses felt. Normal bowel sounds heard. Central nervous system: Alert and  oriented. No focal neurological deficits. Extremities:  No edema, No cyanosis, no clubbing Skin: No rashes, lesions or ulcers Psychiatry: Not assessed.    Data Reviewed: I have personally reviewed following labs and imaging studies  CBC: Recent Labs  Lab 02/07/20 2049 02/08/20 0245 02/09/20 0210  WBC 6.2 5.2 5.6  NEUTROABS 4.8  --   --   HGB 10.8* 10.0* 10.1*  HCT 31.8* 28.9* 29.0*  MCV 93.8 93.2 90.6  PLT 209 211 329   Basic Metabolic Panel: Recent Labs  Lab  02/07/20 2049 02/08/20 0245 02/08/20 0503 02/08/20 0821 02/09/20 0210  NA 125*  --  129* 128* 126*  K 3.7  --  3.5 4.0 4.1  CL 93*  --  102 98 97*  CO2 22  --  20* 21* 23  GLUCOSE 128*  --  92 125* 94  BUN 36*  --  33* 33* 28*  CREATININE 1.64* 1.45* 1.26* 1.33* 1.28*  CALCIUM 8.5*  --  7.8* 8.7* 8.4*  MG  --   --   --   --  1.4*  PHOS  --   --   --   --  2.5   GFR: CrCl cannot be calculated (Unknown ideal weight.). Liver Function Tests: Recent Labs  Lab 02/07/20 2049  AST 20  ALT 13  ALKPHOS 48  BILITOT 0.6  PROT 5.9*  ALBUMIN 3.2*   No results for input(s): LIPASE, AMYLASE in the last 168 hours. No results for input(s): AMMONIA in the last 168 hours. Coagulation Profile: No results for input(s): INR, PROTIME in the last 168 hours. Cardiac Enzymes: No results for input(s): CKTOTAL, CKMB, CKMBINDEX, TROPONINI in the last 168 hours. BNP (last 3 results) No results for input(s): PROBNP in the last 8760 hours. HbA1C: No results for input(s): HGBA1C in the last 72 hours. CBG: No results for input(s): GLUCAP in the last 168 hours. Lipid Profile: No results for input(s): CHOL, HDL, LDLCALC, TRIG, CHOLHDL, LDLDIRECT in the last 72 hours. Thyroid Function Tests: Recent Labs    02/08/20 0503  TSH 1.666   Anemia Panel: No results for input(s): VITAMINB12, FOLATE, FERRITIN, TIBC, IRON, RETICCTPCT in the last 72 hours. Sepsis Labs: No results for input(s): PROCALCITON, LATICACIDVEN in the last 168 hours.  Recent Results (from the past 240 hour(s))  Respiratory Panel by RT PCR (Flu A&B, Covid) - Nasopharyngeal Swab     Status: None   Collection Time: 02/07/20  9:15 PM   Specimen: Nasopharyngeal Swab  Result Value Ref Range Status   SARS Coronavirus 2 by RT PCR NEGATIVE NEGATIVE Final    Comment: (NOTE) SARS-CoV-2 target nucleic acids are NOT DETECTED.  The SARS-CoV-2 RNA is generally detectable in upper respiratoy specimens during the acute phase of infection. The  lowest concentration of SARS-CoV-2 viral copies this assay can detect is 131 copies/mL. A negative result does not preclude SARS-Cov-2 infection and should not be used as the sole basis for treatment or other patient management decisions. A negative result may occur with  improper specimen collection/handling, submission of specimen other than nasopharyngeal swab, presence of viral mutation(s) within the areas targeted by this assay, and inadequate number of viral copies (<131 copies/mL). A negative result must be combined with clinical observations, patient history, and epidemiological information. The expected result is Negative.  Fact Sheet for Patients:  PinkCheek.be  Fact Sheet for Healthcare Providers:  GravelBags.it  This test is no t yet approved or cleared by the [place] FDA and  has  been authorized for detection and/or diagnosis of SARS-CoV-2 by FDA under an Emergency Use Authorization (EUA). This EUA will remain  in effect (meaning this test can be used) for the duration of the COVID-19 declaration under Section 564(b)(1) of the Act, 21 U.S.C. section 360bbb-3(b)(1), unless the authorization is terminated or revoked sooner.     Influenza A by PCR NEGATIVE NEGATIVE Final   Influenza B by PCR NEGATIVE NEGATIVE Final    Comment: (NOTE) The Xpert Xpress SARS-CoV-2/FLU/RSV assay is intended as an aid in  the diagnosis of influenza from Nasopharyngeal swab specimens and  should not be used as a sole basis for treatment. Nasal washings and  aspirates are unacceptable for Xpert Xpress SARS-CoV-2/FLU/RSV  testing.  Fact Sheet for Patients: PinkCheek.be  Fact Sheet for Healthcare Providers: GravelBags.it  This test is not yet approved or cleared by the [place] FDA and  has been authorized for detection and/or diagnosis of SARS-CoV-2 by  FDA under  an Emergency Use Authorization (EUA). This EUA will remain  in effect (meaning this test can be used) for the duration of the  Covid-19 declaration under Section 564(b)(1) of the Act, 21  U.S.C. section 360bbb-3(b)(1), unless the authorization is  terminated or revoked. Performed at [facility], [place] [address] [place], [place] [address]      Radiology Studies: DG Chest 2 View  Result Date: 02/07/2020 CLINICAL DATA:  Multiple falls EXAM: CHEST - 2 VIEW COMPARISON:  2017 FINDINGS: Increased top-normal heart size. Chronic interstitial prominence. No pleural effusion. No acute osseous abnormality. IMPRESSION: Chronic interstitial changes.  Increased top-normal heart size. Electronically Signed   By: Macy Mis M.D.   On: 02/07/2020 21:47   DG Abd 1 View  Result Date: 02/08/2020 CLINICAL DATA:  MRI clearance EXAM: ABDOMEN - 1 VIEW COMPARISON:  None. FINDINGS: Normal abdominal gas pattern. Several surgical clips are noted within the right lower quadrant. No unexpected metallic foreign body within the abdomen. Vascular calcifications are seen within the pelvis. Degenerative changes are seen within the lumbar spine. IMPRESSION: No unexpected metallic foreign body within the abdomen. Electronically Signed   By: Fidela Salisbury MD   On: 02/08/2020 05:00   CT Head Wo Contrast  Result Date: 02/07/2020 CLINICAL DATA:  Multiple falls, orthostatic, normocytic anemia EXAM: CT HEAD WITHOUT CONTRAST CT CERVICAL SPINE WITHOUT CONTRAST TECHNIQUE: Multidetector CT imaging of the head and cervical spine was performed following the standard protocol without intravenous contrast. Multiplanar CT image reconstructions of the cervical spine were also generated. COMPARISON:  CT 08/26/2017, MRI 02/22/2016 FINDINGS: CT HEAD FINDINGS Brain: Scattered hypoattenuating foci in the bilateral basal ganglia and external capsule likely reflect areas of remote lacunar type infarcts. No evidence of acute infarction,  hemorrhage, hydrocephalus, extra-axial collection, visible mass lesion or mass effect. Symmetric prominence of the ventricles, cisterns and sulci compatible with parenchymal volume loss. Patchy areas of white matter hypoattenuation are most compatible with chronic microvascular angiopathy. Vascular: Atherosclerotic calcification of the carotid siphons and intradural vertebral arteries. No hyperdense vessel. Skull: Some mild right temporoparietal and left temporal scalp thickening. Additional thickening and possible laceration of the left frontal scalp as well. No calvarial fracture or acute osseous injury is identified. Sinuses/Orbits: Paranasal sinuses and mastoid air cells are predominantly clear. Orbital structures are unremarkable aside from prior lens extractions. Other: None. CT CERVICAL SPINE FINDINGS Alignment: Cervical stabilization collar is in place at the time of examination. Mild straightening of normal cervical lordosis, possibly positional, degenerative or related to muscle  spasm. No evidence of traumatic listhesis. No abnormally widened, perched or jumped facets. Normal alignment of the craniocervical and atlantoaxial articulations. Skull base and vertebrae: No acute skull base fracture. No vertebral body fracture or height loss. Normal bone mineralization. No worrisome osseous lesions. Moderate arthrosis at the atlantodental interval. Cervical spondylitic changes, detailed below. Soft tissues and spinal canal: No pre or paravertebral fluid or swelling. No visible canal hematoma. Disc levels: Multilevel intervertebral disc height loss with spondylitic endplate changes. Multilevel disc osteophyte complexes efface the ventral thecal sac without significant resulting spinal canal stenosis. Multilevel uncinate spurring and facet degenerative changes which are most pronounced on the left at C3-4 and on the right at C2-3 where more moderate foraminal impingement is present. More mild multilevel foraminal  narrowing is seen throughout the remaining cervical levels. Upper chest: Extensive biapical pleuroparenchymal scarring is noted with some apical emphysematous changes as well. Cervical carotid atherosclerosis is present. Other: No concerning thyroid nodules or masses. IMPRESSION: 1. No acute intracranial abnormality. 2. Some mild right temporoparietal and left temporal scalp thickening. Additional thickening and possible laceration of the left frontal scalp as well. No calvarial fracture or acute osseous injury is identified. 3. Chronic microvascular ischemic changes, parenchymal volume loss and intracranial atherosclerosis. 4. Punctate hypoattenuating foci in the bilateral basal ganglia and external capsules compatible with prior lacunar infarcts. 5. No evidence of acute fracture or traumatic listhesis of the cervical spine. 6. Cervical spondylitic and facet degenerative changes, as above. 7. Cervical and intracranial atherosclerosis. Electronically Signed   By: Lovena Le M.D.   On: 02/07/2020 22:05   CT Cervical Spine Wo Contrast  Result Date: 02/07/2020 CLINICAL DATA:  Multiple falls, orthostatic, normocytic anemia EXAM: CT HEAD WITHOUT CONTRAST CT CERVICAL SPINE WITHOUT CONTRAST TECHNIQUE: Multidetector CT imaging of the head and cervical spine was performed following the standard protocol without intravenous contrast. Multiplanar CT image reconstructions of the cervical spine were also generated. COMPARISON:  CT 08/26/2017, MRI 02/22/2016 FINDINGS: CT HEAD FINDINGS Brain: Scattered hypoattenuating foci in the bilateral basal ganglia and external capsule likely reflect areas of remote lacunar type infarcts. No evidence of acute infarction, hemorrhage, hydrocephalus, extra-axial collection, visible mass lesion or mass effect. Symmetric prominence of the ventricles, cisterns and sulci compatible with parenchymal volume loss. Patchy areas of white matter hypoattenuation are most compatible with chronic  microvascular angiopathy. Vascular: Atherosclerotic calcification of the carotid siphons and intradural vertebral arteries. No hyperdense vessel. Skull: Some mild right temporoparietal and left temporal scalp thickening. Additional thickening and possible laceration of the left frontal scalp as well. No calvarial fracture or acute osseous injury is identified. Sinuses/Orbits: Paranasal sinuses and mastoid air cells are predominantly clear. Orbital structures are unremarkable aside from prior lens extractions. Other: None. CT CERVICAL SPINE FINDINGS Alignment: Cervical stabilization collar is in place at the time of examination. Mild straightening of normal cervical lordosis, possibly positional, degenerative or related to muscle spasm. No evidence of traumatic listhesis. No abnormally widened, perched or jumped facets. Normal alignment of the craniocervical and atlantoaxial articulations. Skull base and vertebrae: No acute skull base fracture. No vertebral body fracture or height loss. Normal bone mineralization. No worrisome osseous lesions. Moderate arthrosis at the atlantodental interval. Cervical spondylitic changes, detailed below. Soft tissues and spinal canal: No pre or paravertebral fluid or swelling. No visible canal hematoma. Disc levels: Multilevel intervertebral disc height loss with spondylitic endplate changes. Multilevel disc osteophyte complexes efface the ventral thecal sac without significant resulting spinal canal stenosis. Multilevel uncinate spurring and facet  degenerative changes which are most pronounced on the left at C3-4 and on the right at C2-3 where more moderate foraminal impingement is present. More mild multilevel foraminal narrowing is seen throughout the remaining cervical levels. Upper chest: Extensive biapical pleuroparenchymal scarring is noted with some apical emphysematous changes as well. Cervical carotid atherosclerosis is present. Other: No concerning thyroid nodules or  masses. IMPRESSION: 1. No acute intracranial abnormality. 2. Some mild right temporoparietal and left temporal scalp thickening. Additional thickening and possible laceration of the left frontal scalp as well. No calvarial fracture or acute osseous injury is identified. 3. Chronic microvascular ischemic changes, parenchymal volume loss and intracranial atherosclerosis. 4. Punctate hypoattenuating foci in the bilateral basal ganglia and external capsules compatible with prior lacunar infarcts. 5. No evidence of acute fracture or traumatic listhesis of the cervical spine. 6. Cervical spondylitic and facet degenerative changes, as above. 7. Cervical and intracranial atherosclerosis. Electronically Signed   By: Lovena Le M.D.   On: 02/07/2020 22:05   MR BRAIN WO CONTRAST  Result Date: 02/08/2020 CLINICAL DATA:  Neuro deficit with acute stroke suspected. EXAM: MRI HEAD WITHOUT CONTRAST TECHNIQUE: Multiplanar, multiecho pulse sequences of the brain and surrounding structures were obtained without intravenous contrast. COMPARISON:  Head CT from yesterday FINDINGS: Brain: Prominent generalized cortical atrophy. No acute infarct, hemorrhage, hydrocephalus, or collection. No masslike finding. Mild chronic white matter disease for age. Vascular: Preserved flow voids Skull and upper cervical spine: No focal marrow lesion. Facet osteoarthritis with notable spurring on the right at C2-3. Sinuses/Orbits: Bilateral cataract resection. Opacified left frontal sinus without fluid level IMPRESSION: 1. No acute or reversible finding. 2. Generalized cortical atrophy. Electronically Signed   By: Monte Fantasia M.D.   On: 02/08/2020 06:32   ECHOCARDIOGRAM COMPLETE  Result Date: 02/08/2020    ECHOCARDIOGRAM REPORT   Patient Name:   Adam Clements Date of Exam: 02/08/2020 Medical Rec #:  [id number]       Height:       67.2 in Accession #:    [id number]      Weight:       145.6 lb Date of Birth:  [birth year]        BSA:           1.771 m Patient Age:    57 years        BP:           151/94 mmHg Patient Gender: M               HR:           65 bpm. Exam Location:  Inpatient Procedure: 2D Echo, Cardiac Doppler and Color Doppler Indications:    Syncope  History:        Patient has prior history of Echocardiogram examinations, most                 recent 10/19/2017. Mitral Valve Disease, Arrythmias:RBBB and PVC,                 Signs/Symptoms:Syncope; Risk Factors:Hypertension and Former                 Smoker. Non-ischemic cardiomyopathy, HFrEF, PAD.  Sonographer:    Clayton Lefort RDCS (AE) Referring Phys: [place] Comments: Suboptimal subcostal window. IMPRESSIONS  1. Left ventricular ejection fraction, by estimation, is 35 to 40%. The left ventricle has moderately decreased function. The left ventricle demonstrates global hypokinesis. There is severe concentric left ventricular hypertrophy.  Left ventricular diastolic parameters are indeterminate.  2. Right ventricular systolic function is normal. The right ventricular size is normal.  3. The mitral valve is grossly normal. Trivial mitral valve regurgitation.  4. The aortic valve was not well visualized. Aortic valve regurgitation is not visualized. No aortic stenosis is present.  5. Aortic dilatation noted. There is mild dilatation of the aortic root, measuring 40 mm. There is borderline dilatation of the ascending aorta, measuring 37 mm. Comparison(s): A prior study was performed on 10/20/19. Compared to prior: similar LV function and stroke volume. Slight dilation of the aortic root. Calcifed papillary muscle stable from 2019 study. FINDINGS  Left Ventricle: Left ventricular ejection fraction, by estimation, is 35 to 40%. The left ventricle has moderately decreased function. The left ventricle demonstrates global hypokinesis. The left ventricular internal cavity size was normal in size. There is severe concentric left ventricular hypertrophy. Left ventricular diastolic  parameters are indeterminate. Right Ventricle: The right ventricular size is normal. No increase in right ventricular wall thickness. Right ventricular systolic function is normal. Left Atrium: Left atrial size was normal in size. Right Atrium: Right atrial size was normal in size. Pericardium: There is no evidence of pericardial effusion. Mitral Valve: The mitral valve is grossly normal. Mild mitral annular calcification. Trivial mitral valve regurgitation. Tricuspid Valve: The tricuspid valve is normal in structure. Tricuspid valve regurgitation is not demonstrated. Aortic Valve: The aortic valve was not well visualized. Aortic valve regurgitation is not visualized. No aortic stenosis is present. Aortic valve mean gradient measures 3.0 mmHg. Aortic valve peak gradient measures 5.8 mmHg. Aortic valve area, by VTI measures 1.83 cm. Pulmonic Valve: The pulmonic valve was not well visualized. Pulmonic valve regurgitation is not visualized. Aorta: Aortic dilatation noted. There is mild dilatation of the aortic root, measuring 40 mm. There is borderline dilatation of the ascending aorta, measuring 37 mm. IAS/Shunts: The atrial septum is grossly normal.  LEFT VENTRICLE PLAX 2D LVIDd:         4.70 cm LVIDs:         4.30 cm LV PW:         1.50 cm LV IVS:        1.60 cm LVOT diam:     1.90 cm LV SV:         42 LV SV Index:   24 LVOT Area:     2.84 cm  LV Volumes (MOD) LV vol d, MOD A2C: 116.0 ml LV vol d, MOD A4C: 154.0 ml LV vol s, MOD A2C: 63.3 ml LV vol s, MOD A4C: 107.0 ml LV SV MOD A2C:     52.7 ml LV SV MOD A4C:     154.0 ml LV SV MOD BP:      52.6 ml RIGHT VENTRICLE RV Basal diam:  3.50 cm RV Mid diam:    2.40 cm RV S prime:     10.60 cm/s TAPSE (M-mode): 2.2 cm LEFT ATRIUM           Index       RIGHT ATRIUM           Index LA diam:      3.80 cm 2.15 cm/m  RA Area:     17.60 cm LA Vol (A2C): 47.2 ml 26.65 ml/m RA Volume:   50.60 ml  28.57 ml/m LA Vol (A4C): 55.7 ml 31.45 ml/m  AORTIC VALVE AV Area (Vmax):     1.58 cm AV Area (Vmean):   1.47 cm AV Area (VTI):  1.83 cm AV Vmax:           120.00 cm/s AV Vmean:          74.700 cm/s AV VTI:            0.228 m AV Peak Grad:      5.8 mmHg AV Mean Grad:      3.0 mmHg LVOT Vmax:         66.70 cm/s LVOT Vmean:        38.600 cm/s LVOT VTI:          0.147 m LVOT/AV VTI ratio: 0.64  AORTA Ao Root diam: 4.00 cm Ao Asc diam:  3.70 cm  SHUNTS Systemic VTI:  0.15 m Systemic Diam: 1.90 cm Rudean Haskell MD Electronically signed by Rudean Haskell MD Signature Date/Time: 02/08/2020/5:00:16 PM    Final     Scheduled Meds: . atomoxetine  20 mg Oral TID  . dipyridamole-aspirin  1 capsule Oral BID  . enoxaparin (LOVENOX) injection  40 mg Subcutaneous Q24H  . pravastatin  20 mg Oral q1800  . sodium chloride  1 g Oral TID WC  . vitamin B-12  500 mcg Oral Q M,W,F   Continuous Infusions:   LOS: 0 days    Time spent: 25 mins    Shawna Clamp, MD [facility]   If 7PM-7AM, please contact night-coverage

## 2020-02-09 NOTE — Progress Notes (Addendum)
Patient oriented to self only. CSW called for SNF consult. No answer, left voicemail requesting return call.

## 2020-02-09 NOTE — Evaluation (Signed)
Physical Therapy Evaluation Patient Details Name: Adam Clements MRN: [id number] DOB: 1934-10-15 Today's Date: 02/09/2020   History of Present Illness  85yo male brought to the ED after multiple episodes of syncope during which he has hit his head and lost consiciousness. EKG in NSR with known R BBB, CTH and cervical spine imaging clear of acute injury. PMH neurogenic orthostatic hypotension/autonomic failure, cardiomyopathy, supine HTN, TIA, anemia, CKD, gout, memory loss, PVD  Clinical Impression   Patient received in bed, very lethargic but able to wake with repeated multimodal cues; somewhat HOH but able to interact appropriately with therapist today, although a bit impulsive. Applied his personal compression stockings prior to mobility; dynamap unfortunately not available (all in use by nursing), so limited session based on symptoms and nursing reports of orthostatic trends. Needed ModA to stand at bedside and immediately felt dizzy, so we returned to supine where dizziness resolved. Once back in supine, he quickly returned to sleep and lethargic state, but was arousable - RN reports this has been his baseline behavior. Left in bed with all needs met, bed alarm active and nursing staff aware of patient status. Will definitely benefit from SNF and 24/7A moving forward.     Follow Up Recommendations SNF;Supervision/Assistance - 24 hour    Equipment Recommendations  Other (comment) (defer to next venue)    Recommendations for Other Services       Precautions / Restrictions Precautions Precautions: Fall;Other (comment) Precaution Comments: autonomic failure/neurogenic orthostatic hypotension- has gone syncopal in less than 20 seconds in standing with nursing, impulsive Restrictions Weight Bearing Restrictions: No      Mobility  Bed Mobility Overal bed mobility: Needs Assistance Bed Mobility: Supine to Sit;Sit to Supine     Supine to sit: Min guard Sit to supine: Min guard    General bed mobility comments: min guard and increased time/effort    Transfers Overall transfer level: Needs assistance Equipment used: None Transfers: Sit to/from Stand Sit to Stand: Mod assist         General transfer comment: ModA to boost to full upright standing, once up immediately stated "I feel dizzy" so returned to supine  Ambulation/Gait             General Gait Details: deferred- safety/known orthostatic drop  Stairs            Wheelchair Mobility    Modified Rankin (Stroke Patients Only)       Balance Overall balance assessment: History of Falls;Needs assistance Sitting-balance support: Bilateral upper extremity supported;Feet supported Sitting balance-Leahy Scale: Fair Sitting balance - Comments: min guard-close S for safety   Standing balance support: No upper extremity supported;During functional activity Standing balance-Leahy Scale: Poor Standing balance comment: MinA to maintain balance once standing                             Pertinent Vitals/Pain Pain Assessment: Faces Faces Pain Scale: No hurt Pain Intervention(s): Limited activity within patient's tolerance;Monitored during session    Sigurd expects to be discharged to:: Private residence Living Arrangements: Spouse/significant other Available Help at Discharge: Family;Available 24 hours/day Type of Home: House           Additional Comments: very poor historian- only really able to get limited information from him and family not present to assist/clarify    Prior Function                 Hand Dominance  Extremity/Trunk Assessment   Upper Extremity Assessment Upper Extremity Assessment: Defer to OT evaluation    Lower Extremity Assessment Lower Extremity Assessment: Generalized weakness;Difficult to assess due to impaired cognition    Cervical / Trunk Assessment Cervical / Trunk Assessment: Normal  Communication    Communication: HOH  Cognition Arousal/Alertness: Lethargic Behavior During Therapy: Flat affect;Impulsive Overall Cognitive Status: Impaired/Different from baseline Area of Impairment: Orientation;Attention;Memory;Following commands;Safety/judgement;Awareness;Problem solving                 Orientation Level: Disoriented to;Situation Current Attention Level: Focused Memory: Decreased short-term memory;Decreased recall of precautions Following Commands: Follows one step commands inconsistently;Follows one step commands with increased time Safety/Judgement: Decreased awareness of safety;Decreased awareness of deficits Awareness: Intellectual Problem Solving: Slow processing;Decreased initiation;Difficulty sequencing;Requires verbal cues;Requires tactile cues General Comments: lethargic but arouses with multimodal stimulation and is interactive with therapy once awake; impulsive with poor awareness of safety and deficits/fall risk      General Comments General comments (skin integrity, edema, etc.): dynamap unavailable (nursing staff using all units), but limited session based on symptoms and nursing report of BP trends with mobility    Exercises     Assessment/Plan    PT Assessment Patient needs continued PT services  PT Problem List Decreased strength;Decreased cognition;Decreased knowledge of use of DME;Decreased activity tolerance;Decreased safety awareness;Decreased balance;Decreased knowledge of precautions;Decreased mobility;Cardiopulmonary status limiting activity;Decreased coordination       PT Treatment Interventions DME instruction;Balance training;Gait training;Stair training;Cognitive remediation;Functional mobility training;Patient/family education;Therapeutic activities;Therapeutic exercise    PT Goals (Current goals can be found in the Care Plan section)  Acute Rehab PT Goals PT Goal Formulation: Patient unable to participate in goal setting Time For Goal  Achievement: 02/23/20 Potential to Achieve Goals: Fair    Frequency Min 2X/week   Barriers to discharge        Co-evaluation               AM-PAC PT "6 Clicks" Mobility  Outcome Measure Help needed turning from your back to your side while in a flat bed without using bedrails?: A Little Help needed moving from lying on your back to sitting on the side of a flat bed without using bedrails?: A Little Help needed moving to and from a bed to a chair (including a wheelchair)?: A Lot Help needed standing up from a chair using your arms (e.g., wheelchair or bedside chair)?: A Lot Help needed to walk in hospital room?: Total Help needed climbing 3-5 steps with a railing? : Total 6 Click Score: 12    End of Session Equipment Utilized During Treatment: Gait belt Activity Tolerance: Treatment limited secondary to medical complications (Comment) (orthostatic drop, symptomatic) Patient left: in bed;with call bell/phone within reach;with bed alarm set Nurse Communication: Mobility status PT Visit Diagnosis: Unsteadiness on feet (R26.81);Muscle weakness (generalized) (M62.81);Repeated falls (R29.6);Difficulty in walking, not elsewhere classified (R26.2)    Time: 0973-5329 PT Time Calculation (min) (ACUTE ONLY): 19 min   Charges:   PT Evaluation $PT Eval High Complexity: 1 High          Windell Norfolk, DPT, PN1   Supplemental Physical Therapist [place]    Pager [phone] Acute Rehab Office [phone]

## 2020-02-09 NOTE — TOC Initial Note (Signed)
Transition of Care [facility]) - Initial/Assessment Note    Patient Details  Name: Adam Clements MRN: [id number] Date of Birth: December 30, 1934  [facility] CM/SW Contact:    Bethann Berkshire, [place] Phone Number: 02/09/2020, 3:19 PM  Clinical Narrative:                  CSW met with pt and pt daughter Clyde Canterbury bedside. Pt is oriented to self only. He does ask questions about physical therapy and which rehab he will be doing it at. CSW spoke with daughter about SNF bed request/offer process and insurance process. Daughter states that family is wanting rehab with no specific preference yet. Daughter asks questions about long-term care options in facilities and at home. CSW provides daughter with list of ALF and Private Duty agencies and also explains Medicare coverage.   Daughter states that her father lives at home with his wife in [place]. Daughter Clyde Canterbury lives in [place]. Pt also has a daughter [name] who lives in [place]. Daughter provides updated contact info for all of family. CSW adds family contacts to pt chart. CSW attempted to call pt wife's cell phone number; no answer and voice mailbox full.   CSW completed Fl2 and send bed requests to SNFs.   Expected Discharge Plan: Skilled Nursing Facility Barriers to Discharge: Continued Medical Work up, SNF Pending bed offer   Patient Goals and CMS Choice Patient states their goals for this hospitalization and ongoing recovery are:: SNF for rehab CMS Medicare.gov Compare Post Acute Care list provided to:: Patient Choice offered to / list presented to : Patient, Adult Children  Expected Discharge Plan and Services Expected Discharge Plan: [place] arrangements for the past 2 months: Single Family Home                                      Prior Living Arrangements/Services Living arrangements for the past 2 months: Single Family Home Lives with:: Spouse Patient language and need for  interpreter reviewed:: Yes        Need for Family Participation in Patient Care: Yes (Comment) Care giver support system in place?: No (comment)      Activities of Daily Living      Permission Sought/Granted   Permission granted to share information with : Yes, Verbal Permission Granted  Share Information with NAME: Daughter's Clyde Canterbury and [name], Wife [name]           Emotional Assessment Appearance:: Appears stated age Attitude/Demeanor/Rapport: Engaged (confused, attempting to engage) Affect (typically observed): Pleasant Orientation: : Oriented to Self Alcohol / Substance Use: Not Applicable Psych Involvement: No (comment)  Admission diagnosis:  Syncope and collapse [R55] Syncope [R55] Foreign body (FB) in soft tissue [M79.5] Near syncope [R55] Patient Active Problem List   Diagnosis Date Noted  . Near syncope 02/09/2020  . Autonomic failure   . Syncope and collapse 02/07/2020  . Health maintenance examination 10/01/2019  . Hematospermia 10/01/2019  . Monoclonal gammopathy of undetermined significance 08/13/2019  . Hyponatremia 07/24/2019  . Right foot pain 07/05/2019  . NICM (nonischemic cardiomyopathy) ([place]) 05/30/2019  . Scrotal masses 05/25/2019  . MCI (mild cognitive impairment) with memory loss 09/22/2018  . Inadequate oral nutritional intake 04/03/2018  . Biceps tendon rupture, right, initial encounter 04/02/2018  . 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 Vision loss, left eye  08/18/2017  . Incomplete bladder emptying 04/14/2017  . Protein malnutrition ([place]) 09/15/2016  . Bilateral sensorineural hearing loss 07/21/2016  . Decreased stamina 07/21/2016  . RBBB 06/30/2015  . Confusional state 05/07/2015  . TIA (transient ischemic attack) 01/02/2015  . Syncope due to orthostatic hypotension 12/27/2013  . Advanced care planning/counseling discussion 12/12/2013  . Neurogenic orthostatic hypotension ([place]) 06/03/2013  . Chronic venous insufficiency 05/26/2013   . PAD (peripheral artery disease) ([place]) 08/09/2012  . Medicare annual wellness visit, subsequent 11/10/2011  . Normocytic anemia 09/18/2011  . Weight loss 09/12/2011  . HFrEF (heart failure with reduced ejection fraction) ([place]) 07/18/2010  . Atrial arrhythmia 11/15/2008  . MITRAL REGURGITATION 08/25/2008  . Biceps tendon rupture, left, sequela 10/01/2007  . Palpitations 12/29/2006  . Allergic rhinitis 10/07/2006  . DIVERTICULOSIS, COLON 10/07/2006  . BPH (benign prostatic hyperplasia) 10/07/2006  . Rosacea 10/07/2006   PCP:  Ria Bush, MD Pharmacy:   [facility] [address], [place] - [place] [address] [place] [address] [place] [address] Phone: [phone] Fax: [phone]  Sharlene Dory, [facility] [place] [address] Phone: [phone] Fax: [phone]     Social Determinants of Health (SDOH) Interventions    Readmission Risk Interventions No flowsheet data found.

## 2020-02-09 NOTE — Care Management (Signed)
Patient oriented to self only. Called wife Dre Gamino [phone], to do assessment and medicare observation letter , left voicemail with NCM direct call back number. Await call back.   Await PT/OT assessments.   Magdalen Spatz RN

## 2020-02-09 NOTE — Progress Notes (Signed)
Progress Note  Patient Name: EDDER BELLANCA Date of Encounter: 02/09/2020  [facility] Cardiologist: Virl Axe, MD   Subjective   Patient very sleepy.  Unable to get interview from him this AM.  Inpatient Medications    Scheduled Meds: . atomoxetine  20 mg Oral BID  . dipyridamole-aspirin  1 capsule Oral BID  . enoxaparin (LOVENOX) injection  40 mg Subcutaneous Q24H  . pravastatin  20 mg Oral q1800  . sodium chloride  1 g Oral TID WC  . vitamin B-12  500 mcg Oral Q M,W,F   Continuous Infusions: . magnesium sulfate bolus IVPB     PRN Meds: acetaminophen **OR** acetaminophen, labetalol   Vital Signs    Vitals:   02/08/20 2356 02/09/20 0537 02/09/20 0655 02/09/20 0704  BP: (!) 184/90 (!) 197/97 (!) 186/94 131/73  Pulse: 63 71 64 65  Resp: 18 18    Temp: 97.6 F (36.4 C) 97.8 F (36.6 C)    TempSrc: Oral     SpO2: 96% 97%      Intake/Output Summary (Last 24 hours) at 02/09/2020 1005 Last data filed at 02/09/2020 0800 Gross per 24 hour  Intake 495.89 ml  Output 650 ml  Net -154.11 ml   Last 3 Weights 01/06/2020 10/31/2019 10/11/2019  Weight (lbs) 145 lb 9 oz 145 lb 7 oz 149 lb 9.6 oz  Weight (kg) 66.027 kg 65.97 kg 67.858 kg      Telemetry    Sinus rhythm.  PVCs.  Short runs of NSVT - Personally Reviewed  ECG    n/a - Personally Reviewed  Physical Exam   VS:  BP 131/73 (BP Location: Right Arm)   Pulse 65   Temp 97.8 F (36.6 C)   Resp 18   SpO2 97%  , BMI There is no height or weight on file to calculate BMI. GENERAL:  Chronically ill-appearing, frail elderly amn HEENT: Pupils equal round and reactive, fundi not visualized, oral mucosa unremarkable.  Edentulous NECK:  No jugular venous distention, waveform within normal limits, carotid upstroke brisk and symmetric, no bruits LUNGS:  Clear to auscultation bilaterally HEART:  RRR.  PMI not displaced or sustained,S1 and S2 within normal limits, no S3, no S4, no clicks, no rubs, no murmurs ABD:   Flat, positive bowel sounds normal in frequency in pitch, no bruits, no rebound, no guarding, no midline pulsatile mass, no hepatomegaly, no splenomegaly EXT:  2 plus pulses throughout, no edema, no cyanosis no clubbing SKIN:  No rashes no nodules NEURO:  Cranial nerves II through XII grossly intact, motor grossly intact throughout [facility]:  Cognitively intact, oriented to person place and time   Labs    High Sensitivity Troponin:   Recent Labs  Lab 02/07/20 2049 02/07/20 2233 02/08/20 0503  TROPONINIHS 50* 50* 61*      Chemistry Recent Labs  Lab 02/07/20 2049 02/08/20 0245 02/08/20 0503 02/08/20 0821 02/09/20 0210  NA 125*  --  129* 128* 126*  K 3.7  --  3.5 4.0 4.1  CL 93*  --  102 98 97*  CO2 22  --  20* 21* 23  GLUCOSE 128*  --  92 125* 94  BUN 36*  --  33* 33* 28*  CREATININE 1.64*   < > 1.26* 1.33* 1.28*  CALCIUM 8.5*  --  7.8* 8.7* 8.4*  PROT 5.9*  --   --   --   --   ALBUMIN 3.2*  --   --   --   --  AST 20  --   --   --   --   ALT 13  --   --   --   --   ALKPHOS 48  --   --   --   --   BILITOT 0.6  --   --   --   --   GFRNONAA 41*   < > 57* 53* 56*  ANIONGAP 10  --  7 9 6    < > = values in this interval not displayed.     Hematology Recent Labs  Lab 02/07/20 2049 02/08/20 0245 02/09/20 0210  WBC 6.2 5.2 5.6  RBC 3.39* 3.10* 3.20*  HGB 10.8* 10.0* 10.1*  HCT 31.8* 28.9* 29.0*  MCV 93.8 93.2 90.6  MCH 31.9 32.3 31.6  MCHC 34.0 34.6 34.8  RDW 11.9 11.9 11.8  PLT 209 211 211    BNPNo results for input(s): BNP, PROBNP in the last 168 hours.   DDimer No results for input(s): DDIMER in the last 168 hours.   Radiology    DG Chest 2 View  Result Date: 02/07/2020 CLINICAL DATA:  Multiple falls EXAM: CHEST - 2 VIEW COMPARISON:  2017 FINDINGS: Increased top-normal heart size. Chronic interstitial prominence. No pleural effusion. No acute osseous abnormality. IMPRESSION: Chronic interstitial changes.  Increased top-normal heart size. Electronically  Signed   By: Macy Mis M.D.   On: 02/07/2020 21:47   DG Abd 1 View  Result Date: 02/08/2020 CLINICAL DATA:  MRI clearance EXAM: ABDOMEN - 1 VIEW COMPARISON:  None. FINDINGS: Normal abdominal gas pattern. Several surgical clips are noted within the right lower quadrant. No unexpected metallic foreign body within the abdomen. Vascular calcifications are seen within the pelvis. Degenerative changes are seen within the lumbar spine. IMPRESSION: No unexpected metallic foreign body within the abdomen. Electronically Signed   By: Fidela Salisbury MD   On: 02/08/2020 05:00   CT Head Wo Contrast  Result Date: 02/07/2020 CLINICAL DATA:  Multiple falls, orthostatic, normocytic anemia EXAM: CT HEAD WITHOUT CONTRAST CT CERVICAL SPINE WITHOUT CONTRAST TECHNIQUE: Multidetector CT imaging of the head and cervical spine was performed following the standard protocol without intravenous contrast. Multiplanar CT image reconstructions of the cervical spine were also generated. COMPARISON:  CT 08/26/2017, MRI 02/22/2016 FINDINGS: CT HEAD FINDINGS Brain: Scattered hypoattenuating foci in the bilateral basal ganglia and external capsule likely reflect areas of remote lacunar type infarcts. No evidence of acute infarction, hemorrhage, hydrocephalus, extra-axial collection, visible mass lesion or mass effect. Symmetric prominence of the ventricles, cisterns and sulci compatible with parenchymal volume loss. Patchy areas of white matter hypoattenuation are most compatible with chronic microvascular angiopathy. Vascular: Atherosclerotic calcification of the carotid siphons and intradural vertebral arteries. No hyperdense vessel. Skull: Some mild right temporoparietal and left temporal scalp thickening. Additional thickening and possible laceration of the left frontal scalp as well. No calvarial fracture or acute osseous injury is identified. Sinuses/Orbits: Paranasal sinuses and mastoid air cells are predominantly clear. Orbital  structures are unremarkable aside from prior lens extractions. Other: None. CT CERVICAL SPINE FINDINGS Alignment: Cervical stabilization collar is in place at the time of examination. Mild straightening of normal cervical lordosis, possibly positional, degenerative or related to muscle spasm. No evidence of traumatic listhesis. No abnormally widened, perched or jumped facets. Normal alignment of the craniocervical and atlantoaxial articulations. Skull base and vertebrae: No acute skull base fracture. No vertebral body fracture or height loss. Normal bone mineralization. No worrisome osseous lesions. Moderate arthrosis at the atlantodental  interval. Cervical spondylitic changes, detailed below. Soft tissues and spinal canal: No pre or paravertebral fluid or swelling. No visible canal hematoma. Disc levels: Multilevel intervertebral disc height loss with spondylitic endplate changes. Multilevel disc osteophyte complexes efface the ventral thecal sac without significant resulting spinal canal stenosis. Multilevel uncinate spurring and facet degenerative changes which are most pronounced on the left at C3-4 and on the right at C2-3 where more moderate foraminal impingement is present. More mild multilevel foraminal narrowing is seen throughout the remaining cervical levels. Upper chest: Extensive biapical pleuroparenchymal scarring is noted with some apical emphysematous changes as well. Cervical carotid atherosclerosis is present. Other: No concerning thyroid nodules or masses. IMPRESSION: 1. No acute intracranial abnormality. 2. Some mild right temporoparietal and left temporal scalp thickening. Additional thickening and possible laceration of the left frontal scalp as well. No calvarial fracture or acute osseous injury is identified. 3. Chronic microvascular ischemic changes, parenchymal volume loss and intracranial atherosclerosis. 4. Punctate hypoattenuating foci in the bilateral basal ganglia and external capsules  compatible with prior lacunar infarcts. 5. No evidence of acute fracture or traumatic listhesis of the cervical spine. 6. Cervical spondylitic and facet degenerative changes, as above. 7. Cervical and intracranial atherosclerosis. Electronically Signed   By: Lovena Le M.D.   On: 02/07/2020 22:05   CT Cervical Spine Wo Contrast  Result Date: 02/07/2020 CLINICAL DATA:  Multiple falls, orthostatic, normocytic anemia EXAM: CT HEAD WITHOUT CONTRAST CT CERVICAL SPINE WITHOUT CONTRAST TECHNIQUE: Multidetector CT imaging of the head and cervical spine was performed following the standard protocol without intravenous contrast. Multiplanar CT image reconstructions of the cervical spine were also generated. COMPARISON:  CT 08/26/2017, MRI 02/22/2016 FINDINGS: CT HEAD FINDINGS Brain: Scattered hypoattenuating foci in the bilateral basal ganglia and external capsule likely reflect areas of remote lacunar type infarcts. No evidence of acute infarction, hemorrhage, hydrocephalus, extra-axial collection, visible mass lesion or mass effect. Symmetric prominence of the ventricles, cisterns and sulci compatible with parenchymal volume loss. Patchy areas of white matter hypoattenuation are most compatible with chronic microvascular angiopathy. Vascular: Atherosclerotic calcification of the carotid siphons and intradural vertebral arteries. No hyperdense vessel. Skull: Some mild right temporoparietal and left temporal scalp thickening. Additional thickening and possible laceration of the left frontal scalp as well. No calvarial fracture or acute osseous injury is identified. Sinuses/Orbits: Paranasal sinuses and mastoid air cells are predominantly clear. Orbital structures are unremarkable aside from prior lens extractions. Other: None. CT CERVICAL SPINE FINDINGS Alignment: Cervical stabilization collar is in place at the time of examination. Mild straightening of normal cervical lordosis, possibly positional, degenerative or  related to muscle spasm. No evidence of traumatic listhesis. No abnormally widened, perched or jumped facets. Normal alignment of the craniocervical and atlantoaxial articulations. Skull base and vertebrae: No acute skull base fracture. No vertebral body fracture or height loss. Normal bone mineralization. No worrisome osseous lesions. Moderate arthrosis at the atlantodental interval. Cervical spondylitic changes, detailed below. Soft tissues and spinal canal: No pre or paravertebral fluid or swelling. No visible canal hematoma. Disc levels: Multilevel intervertebral disc height loss with spondylitic endplate changes. Multilevel disc osteophyte complexes efface the ventral thecal sac without significant resulting spinal canal stenosis. Multilevel uncinate spurring and facet degenerative changes which are most pronounced on the left at C3-4 and on the right at C2-3 where more moderate foraminal impingement is present. More mild multilevel foraminal narrowing is seen throughout the remaining cervical levels. Upper chest: Extensive biapical pleuroparenchymal scarring is noted with some apical emphysematous changes  as well. Cervical carotid atherosclerosis is present. Other: No concerning thyroid nodules or masses. IMPRESSION: 1. No acute intracranial abnormality. 2. Some mild right temporoparietal and left temporal scalp thickening. Additional thickening and possible laceration of the left frontal scalp as well. No calvarial fracture or acute osseous injury is identified. 3. Chronic microvascular ischemic changes, parenchymal volume loss and intracranial atherosclerosis. 4. Punctate hypoattenuating foci in the bilateral basal ganglia and external capsules compatible with prior lacunar infarcts. 5. No evidence of acute fracture or traumatic listhesis of the cervical spine. 6. Cervical spondylitic and facet degenerative changes, as above. 7. Cervical and intracranial atherosclerosis. Electronically Signed   By: Lovena Le M.D.   On: 02/07/2020 22:05   MR BRAIN WO CONTRAST  Result Date: 02/08/2020 CLINICAL DATA:  Neuro deficit with acute stroke suspected. EXAM: MRI HEAD WITHOUT CONTRAST TECHNIQUE: Multiplanar, multiecho pulse sequences of the brain and surrounding structures were obtained without intravenous contrast. COMPARISON:  Head CT from yesterday FINDINGS: Brain: Prominent generalized cortical atrophy. No acute infarct, hemorrhage, hydrocephalus, or collection. No masslike finding. Mild chronic white matter disease for age. Vascular: Preserved flow voids Skull and upper cervical spine: No focal marrow lesion. Facet osteoarthritis with notable spurring on the right at C2-3. Sinuses/Orbits: Bilateral cataract resection. Opacified left frontal sinus without fluid level IMPRESSION: 1. No acute or reversible finding. 2. Generalized cortical atrophy. Electronically Signed   By: Monte Fantasia M.D.   On: 02/08/2020 06:32   ECHOCARDIOGRAM COMPLETE  Result Date: 02/08/2020    ECHOCARDIOGRAM REPORT   Patient Name:   AASIR DAIGLER Date of Exam: 02/08/2020 Medical Rec #:  [id number]       Height:       67.2 in Accession #:    [id number]      Weight:       145.6 lb Date of Birth:  [birth year]        BSA:          1.771 m Patient Age:    30 years        BP:           151/94 mmHg Patient Gender: M               HR:           65 bpm. Exam Location:  Inpatient Procedure: 2D Echo, Cardiac Doppler and Color Doppler Indications:    Syncope  History:        Patient has prior history of Echocardiogram examinations, most                 recent 10/19/2017. Mitral Valve Disease, Arrythmias:RBBB and PVC,                 Signs/Symptoms:Syncope; Risk Factors:Hypertension and Former                 Smoker. Non-ischemic cardiomyopathy, HFrEF, PAD.  Sonographer:    Clayton Lefort RDCS (AE) Referring Phys: [place] Comments: Suboptimal subcostal window. IMPRESSIONS  1. Left ventricular ejection fraction, by estimation, is 35  to 40%. The left ventricle has moderately decreased function. The left ventricle demonstrates global hypokinesis. There is severe concentric left ventricular hypertrophy. Left ventricular diastolic parameters are indeterminate.  2. Right ventricular systolic function is normal. The right ventricular size is normal.  3. The mitral valve is grossly normal. Trivial mitral valve regurgitation.  4. The aortic valve was not well visualized. Aortic valve regurgitation is not visualized. No aortic  stenosis is present.  5. Aortic dilatation noted. There is mild dilatation of the aortic root, measuring 40 mm. There is borderline dilatation of the ascending aorta, measuring 37 mm. Comparison(s): A prior study was performed on 10/20/19. Compared to prior: similar LV function and stroke volume. Slight dilation of the aortic root. Calcifed papillary muscle stable from 2019 study. FINDINGS  Left Ventricle: Left ventricular ejection fraction, by estimation, is 35 to 40%. The left ventricle has moderately decreased function. The left ventricle demonstrates global hypokinesis. The left ventricular internal cavity size was normal in size. There is severe concentric left ventricular hypertrophy. Left ventricular diastolic parameters are indeterminate. Right Ventricle: The right ventricular size is normal. No increase in right ventricular wall thickness. Right ventricular systolic function is normal. Left Atrium: Left atrial size was normal in size. Right Atrium: Right atrial size was normal in size. Pericardium: There is no evidence of pericardial effusion. Mitral Valve: The mitral valve is grossly normal. Mild mitral annular calcification. Trivial mitral valve regurgitation. Tricuspid Valve: The tricuspid valve is normal in structure. Tricuspid valve regurgitation is not demonstrated. Aortic Valve: The aortic valve was not well visualized. Aortic valve regurgitation is not visualized. No aortic stenosis is present. Aortic valve mean  gradient measures 3.0 mmHg. Aortic valve peak gradient measures 5.8 mmHg. Aortic valve area, by VTI measures 1.83 cm. Pulmonic Valve: The pulmonic valve was not well visualized. Pulmonic valve regurgitation is not visualized. Aorta: Aortic dilatation noted. There is mild dilatation of the aortic root, measuring 40 mm. There is borderline dilatation of the ascending aorta, measuring 37 mm. IAS/Shunts: The atrial septum is grossly normal.  LEFT VENTRICLE PLAX 2D LVIDd:         4.70 cm LVIDs:         4.30 cm LV PW:         1.50 cm LV IVS:        1.60 cm LVOT diam:     1.90 cm LV SV:         42 LV SV Index:   24 LVOT Area:     2.84 cm  LV Volumes (MOD) LV vol d, MOD A2C: 116.0 ml LV vol d, MOD A4C: 154.0 ml LV vol s, MOD A2C: 63.3 ml LV vol s, MOD A4C: 107.0 ml LV SV MOD A2C:     52.7 ml LV SV MOD A4C:     154.0 ml LV SV MOD BP:      52.6 ml RIGHT VENTRICLE RV Basal diam:  3.50 cm RV Mid diam:    2.40 cm RV S prime:     10.60 cm/s TAPSE (M-mode): 2.2 cm LEFT ATRIUM           Index       RIGHT ATRIUM           Index LA diam:      3.80 cm 2.15 cm/m  RA Area:     17.60 cm LA Vol (A2C): 47.2 ml 26.65 ml/m RA Volume:   50.60 ml  28.57 ml/m LA Vol (A4C): 55.7 ml 31.45 ml/m  AORTIC VALVE AV Area (Vmax):    1.58 cm AV Area (Vmean):   1.47 cm AV Area (VTI):     1.83 cm AV Vmax:           120.00 cm/s AV Vmean:          74.700 cm/s AV VTI:            0.228 m AV Peak  Grad:      5.8 mmHg AV Mean Grad:      3.0 mmHg LVOT Vmax:         66.70 cm/s LVOT Vmean:        38.600 cm/s LVOT VTI:          0.147 m LVOT/AV VTI ratio: 0.64  AORTA Ao Root diam: 4.00 cm Ao Asc diam:  3.70 cm  SHUNTS Systemic VTI:  0.15 m Systemic Diam: 1.90 cm Rudean Haskell MD Electronically signed by Rudean Haskell MD Signature Date/Time: 02/08/2020/5:00:16 PM    Final     Cardiac Studies   Echo 02/08/20: 1. Left ventricular ejection fraction, by estimation, is 35 to 40%. The  left ventricle has moderately decreased function. The left  ventricle  demonstrates global hypokinesis. There is severe concentric left  ventricular hypertrophy. Left ventricular  diastolic parameters are indeterminate.  2. Right ventricular systolic function is normal. The right ventricular  size is normal.  3. The mitral valve is grossly normal. Trivial mitral valve  regurgitation.  4. The aortic valve was not well visualized. Aortic valve regurgitation  is not visualized. No aortic stenosis is present.  5. Aortic dilatation noted. There is mild dilatation of the aortic root,  measuring 40 mm. There is borderline dilatation of the ascending aorta,  measuring 37 mm.   Patient Profile     76M with chronic systolic and diastolic heart failure, carotid stenosis, TIA, autonomic failure with recurrent neurogenic syncope and supine hypertension admitted with recurrent syncope  Assessment & Plan    # Neurogenic syncope: # Autonomic failure: # Supine hypertension: Mr. Kent was diagnosed with autonomic failure by tilt test at [facility].  Did discuss his symptoms with Dr. Aleen Sells who did his initial testing.  She suggested that we try increasing his Strattera to 3 times daily.  We will keep his third dose at 4 PM in order to avoid worsening his supine hypertension at night.  Continue with compression stockings, abdominal binder, and hydration.  We will recheck orthostatic vital signs today.  Yesterday he was unable to stand without symptomatic hypotension.  His blood pressure dropped 30 mmHg from lying to sitting.  # Chronic systolic and diastolic heart failure: Euvolemic.  Received gentle IV fluids for the above.       For questions or updates, please contact [place] Please consult [email or web address] for contact info under        Signed, Skeet Latch, MD  02/09/2020, 10:05 AM

## 2020-02-09 NOTE — Telephone Encounter (Signed)
Patient's wife calling to inform the patient is in the hospital. She states he is in room 5 C 10.

## 2020-02-09 NOTE — Evaluation (Signed)
Occupational Therapy Evaluation Patient Details Name: Adam Clements MRN: [id number] DOB: Aug 25, 1934 Today's Date: 02/09/2020    History of Present Illness 85yo male brought to the ED after multiple episodes of syncope during which he has hit his head and lost consiciousness. EKG in NSR with known R BBB, CTH and cervical spine imaging clear of acute injury. PMH neurogenic orthostatic hypotension/autonomic failure, cardiomyopathy, supine HTN, TIA, anemia, CKD, gout, memory loss, PVD   Clinical Impression   PTA patient reports independent with mobility, ADLs and driving. Admitted for above and limited by problem list below, including impaired cognition, impaired balance, orthostatic hypotension, generalized weakness, and decreased activity tolerance. Patient oriented to self, place, time but not to situation, follows 1 step commands with increased time to process and initiate with noted poor attention, safety, and problem solving.  Session limited to EOB, reports "slight dizziness" with prolonged sitting EOB attempting to get BP reading (see below for details and RN notified after session), and requires min guard for bed mobility, min guard up to total assist for ADLs.  Believe he will benefit from continued OT services while admitted and after dc at SNF level to optimize independence and safety with ADls, IADls and mobility.   BP supine 176/88 HR 63        EOB 81/60 HR 64 (but BP completed supine, due to reports of dizziness; with increased UE movement when BP taking)        Supine after activity 153/89     Follow Up Recommendations  SNF;Supervision/Assistance - 24 hour    Equipment Recommendations  Other (comment) (TBD)    Recommendations for Other Services       Precautions / Restrictions Precautions Precautions: Fall;Other (comment) Precaution Comments: autonomic failure/neurogenic orthostatic hypotension- has gone syncopal in less than 20 seconds in standing with nursing,  impulsive Restrictions Weight Bearing Restrictions: No      Mobility Bed Mobility Overal bed mobility: Needs Assistance Bed Mobility: Supine to Sit;Sit to Supine     Supine to sit: Min guard Sit to supine: Min guard   General bed mobility comments: min guard for safety, increased time and effort    Transfers Overall transfer level: Needs assistance Equipment used: None Transfers: Sit to/from Stand Sit to Stand: Mod assist         General transfer comment: deferred    Balance Overall balance assessment: History of Falls;Needs assistance Sitting-balance support: No upper extremity supported;Feet supported Sitting balance-Leahy Scale: Fair Sitting balance - Comments: min guard for safety    Standing balance support: No upper extremity supported;During functional activity Standing balance-Leahy Scale: Poor Standing balance comment: MinA to maintain balance once standing                           ADL either performed or assessed with clinical judgement   ADL Overall ADL's : Needs assistance/impaired     Grooming: Minimal assistance;Sitting   Upper Body Bathing: Minimal assistance;Sitting   Lower Body Bathing: Moderate assistance;Sitting/lateral leans;Bed level   Upper Body Dressing : Minimal assistance;Sitting   Lower Body Dressing: Total assistance;Sitting/lateral leans;Bed level     Toilet Transfer Details (indicate cue type and reason): deferred         Functional mobility during ADLs: Minimal assistance;Cueing for safety General ADL Comments: limited to EOB, limited by orthostatics     Vision         Perception     Praxis  Pertinent Vitals/Pain Pain Assessment: Faces Faces Pain Scale: No hurt Pain Intervention(s): Monitored during session     Hand Dominance     Extremity/Trunk Assessment Upper Extremity Assessment Upper Extremity Assessment: Generalized weakness   Lower Extremity Assessment Lower Extremity Assessment:  Defer to PT evaluation   Cervical / Trunk Assessment Cervical / Trunk Assessment: Normal   Communication Communication Communication: HOH   Cognition Arousal/Alertness: Lethargic Behavior During Therapy: Flat affect Overall Cognitive Status: Impaired/Different from baseline Area of Impairment: Orientation;Attention;Memory;Following commands;Safety/judgement;Awareness;Problem solving                 Orientation Level: Disoriented to;Situation Current Attention Level: Sustained Memory: Decreased short-term memory;Decreased recall of precautions Following Commands: Follows one step commands consistently;Follows one step commands with increased time;Follows multi-step commands inconsistently Safety/Judgement: Decreased awareness of safety;Decreased awareness of deficits Awareness: Emergent Problem Solving: Slow processing;Decreased initiation;Difficulty sequencing;Requires verbal cues;Requires tactile cues General Comments: pt lethargic but easily aroused upon entry, follows simple commands but noted poor problem solving, slow processing, and decreased attention to task   General Comments  BP monitored during session    Exercises     Shoulder Instructions      Home Living Family/patient expects to be discharged to:: Private residence Living Arrangements: Spouse/significant other Available Help at Discharge: Family;Available 24 hours/day Type of Home: House       Home Layout: One level     Bathroom Shower/Tub: Occupational psychologist: Standard     Home Equipment: Environmental consultant - 2 wheels;Cane - single point   Additional Comments: very poor historian- only really able to get limited information from him and family not present to assist/clarify      Prior Functioning/Environment Level of Independence: Independent        Comments: reports independent with mobiltiy, ADLs and IADLs (driving)         OT Problem List: Decreased strength;Decreased activity  tolerance;Impaired balance (sitting and/or standing);Decreased safety awareness;Decreased knowledge of use of DME or AE;Decreased cognition;Decreased knowledge of precautions;Cardiopulmonary status limiting activity      OT Treatment/Interventions: Self-care/ADL training;DME and/or AE instruction;Therapeutic exercise;Therapeutic activities;Cognitive remediation/compensation;Patient/family education;Balance training    OT Goals(Current goals can be found in the care plan section) Acute Rehab OT Goals Patient Stated Goal: none stated Time For Goal Achievement: 02/23/20 Potential to Achieve Goals: Fair  OT Frequency: Min 2X/week   Barriers to D/C:            Co-evaluation              AM-PAC OT "6 Clicks" Daily Activity     Outcome Measure Help from another person eating meals?: A Little Help from another person taking care of personal grooming?: A Little Help from another person toileting, which includes using toliet, bedpan, or urinal?: A Lot Help from another person bathing (including washing, rinsing, drying)?: A Lot Help from another person to put on and taking off regular upper body clothing?: A Little Help from another person to put on and taking off regular lower body clothing?: A Lot 6 Click Score: 15   End of Session Nurse Communication: Mobility status;Precautions;Other (comment) (BP)  Activity Tolerance: Patient limited by lethargy;Treatment limited secondary to medical complications (Comment) (hypotension) Patient left: in bed;with call bell/phone within reach;with bed alarm set  OT Visit Diagnosis: Other abnormalities of gait and mobility (R26.89);Muscle weakness (generalized) (M62.81);History of falling (Z91.81);Other symptoms and signs involving cognitive function;Dizziness and giddiness (R42)  Time: 9704-4925 OT Time Calculation (min): 15 min Charges:  OT General Charges $OT Visit: 1 Visit OT Evaluation $OT Eval Moderate Complexity: 1  Mod  Jolaine Artist, OT Acute Rehabilitation Services Pager [phone] Office [phone]   Delight Stare 02/09/2020, 2:49 PM

## 2020-02-09 NOTE — NC FL2 (Signed)
Adam Clements LEVEL OF CARE SCREENING TOOL     IDENTIFICATION  Patient Name: Adam Clements Birthdate: [birth year] Sex: male Admission Date (Current Location): 02/07/2020  Adam Clements and [place] Number:  Adam Clements:  Adam Clements. Adam Clements, Adam Clements [address], [place], [place] [address]      Provider Number: [id number]  Attending Physician Name and Clements:  Shawna Clamp, MD  Relative Name and Phone Number:  Adam Clements, Adam Clements (Spouse) [phone] Adam Clements)    Current Level of Care: Clements Recommended Level of Care: Adam Clements Prior Approval Number:    Date Approved/Denied: 02/09/20 PASRR Number: [id number]  Discharge Plan: SNF    Current Diagnoses: Patient Active Problem List   Diagnosis Date Noted  . Near syncope 02/09/2020  . Autonomic failure   . Syncope and collapse 02/07/2020  . Health maintenance examination 10/01/2019  . Hematospermia 10/01/2019  . Monoclonal gammopathy of undetermined significance 08/13/2019  . Hyponatremia 07/24/2019  . Right foot pain 07/05/2019  . NICM (nonischemic cardiomyopathy) (Adam Clements) 05/30/2019  . Scrotal masses 05/25/2019  . MCI (mild cognitive impairment) with memory loss 09/22/2018  . Inadequate oral nutritional intake 04/03/2018  . Biceps tendon rupture, right, initial encounter 04/02/2018  . 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 Vision loss, left eye 08/18/2017  . Incomplete bladder emptying 04/14/2017  . Protein malnutrition (Adam Clements) 09/15/2016  . Bilateral sensorineural hearing loss 07/21/2016  . Decreased stamina 07/21/2016  . RBBB 06/30/2015  . Confusional state 05/07/2015  . TIA (transient ischemic attack) 01/02/2015  . Syncope due to orthostatic hypotension 12/27/2013  . Advanced care planning/counseling discussion 12/12/2013  . Neurogenic orthostatic hypotension (Adam Clements) 06/03/2013  . Chronic venous insufficiency 05/26/2013  . PAD (peripheral artery disease) (Adam Clements)  08/09/2012  . Medicare annual wellness visit, subsequent 11/10/2011  . Normocytic anemia 09/18/2011  . Weight loss 09/12/2011  . HFrEF (heart failure with reduced ejection fraction) (Adam Clements) 07/18/2010  . Atrial arrhythmia 11/15/2008  . MITRAL REGURGITATION 08/25/2008  . Biceps tendon rupture, left, sequela 10/01/2007  . Palpitations 12/29/2006  . Allergic rhinitis 10/07/2006  . DIVERTICULOSIS, COLON 10/07/2006  . BPH (benign prostatic hyperplasia) 10/07/2006  . Rosacea 10/07/2006    Orientation RESPIRATION BLADDER Height & Weight     Self  Normal Continent Weight:   Height:     BEHAVIORAL SYMPTOMS/MOOD NEUROLOGICAL BOWEL NUTRITION STATUS      Continent Diet (see d/c summary)  AMBULATORY STATUS COMMUNICATION OF NEEDS Skin   Extensive Assist Verbally Normal, Skin abrasions (laceration to head)                       Personal Care Assistance Level of Assistance  Bathing, Feeding, Dressing Bathing Assistance: Maximum assistance Feeding assistance: Independent Dressing Assistance: Maximum assistance     Functional Limitations Info  Sight, Hearing, Speech Sight Info: Adequate Hearing Info: Impaired Speech Info: Adequate    SPECIAL CARE FACTORS FREQUENCY  PT (By licensed PT), OT (By licensed OT)     PT Frequency: 5x/week OT Frequency: 5x/week            Contractures Contractures Info: Not present    Additional Factors Info  Code Status, Allergies Code Status Info: Full code Allergies Info: Pletal (cilostazol), Atorvastatin           Current Medications (02/09/2020):  This is Adam current Clements active medication list Current Facility-Administered Medications  Medication Dose Route Frequency Provider Last Rate Last Admin  . acetaminophen (TYLENOL) tablet 650  mg  650 mg Oral Q6H PRN Rise Patience, MD       Or  . acetaminophen (TYLENOL) suppository 650 mg  650 mg Rectal Q6H PRN Rise Patience, MD      . atomoxetine (STRATTERA) capsule 20 mg  20  mg Oral TID Skeet Latch, MD   20 mg at 02/09/20 1458  . dipyridamole-aspirin (AGGRENOX) 200-25 MG per 12 hr capsule 1 capsule  1 capsule Oral BID Rise Patience, MD   1 capsule at 02/08/20 2115  . enoxaparin (LOVENOX) injection 40 mg  40 mg Subcutaneous Q24H Rise Patience, MD   40 mg at 02/09/20 0841  . labetalol (NORMODYNE) injection 10 mg  10 mg Intravenous Q2H PRN Opyd, Ilene Qua, MD   10 mg at 02/09/20 0658  . pravastatin (PRAVACHOL) tablet 20 mg  20 mg Oral q1800 Rise Patience, MD   20 mg at 02/08/20 1624  . sodium chloride tablet 1 g  1 g Oral TID WC Shawna Clamp, MD   1 g at 02/09/20 1153  . vitamin B-12 (CYANOCOBALAMIN) tablet 500 mcg  500 mcg Oral Q M,W,F Rise Patience, MD   500 mcg at 02/08/20 5374     Discharge Medications: Please see discharge summary for a list of discharge medications.  Relevant Imaging Results:  Relevant Lab Results:   Additional Information SSN# [id number]  Ke[place]a Sadler, [place]

## 2020-02-10 ENCOUNTER — Telehealth: Payer: Self-pay | Admitting: Internal Medicine

## 2020-02-10 DIAGNOSIS — Z66 Do not resuscitate: Secondary | ICD-10-CM | POA: Diagnosis not present

## 2020-02-10 DIAGNOSIS — I428 Other cardiomyopathies: Secondary | ICD-10-CM | POA: Diagnosis not present

## 2020-02-10 DIAGNOSIS — N179 Acute kidney failure, unspecified: Secondary | ICD-10-CM | POA: Diagnosis not present

## 2020-02-10 DIAGNOSIS — G4752 REM sleep behavior disorder: Secondary | ICD-10-CM | POA: Diagnosis not present

## 2020-02-10 DIAGNOSIS — E871 Hypo-osmolality and hyponatremia: Secondary | ICD-10-CM | POA: Diagnosis not present

## 2020-02-10 DIAGNOSIS — R55 Syncope and collapse: Secondary | ICD-10-CM | POA: Diagnosis not present

## 2020-02-10 DIAGNOSIS — Z20822 Contact with and (suspected) exposure to covid-19: Secondary | ICD-10-CM | POA: Diagnosis not present

## 2020-02-10 DIAGNOSIS — I130 Hypertensive heart and chronic kidney disease with heart failure and stage 1 through stage 4 chronic kidney disease, or unspecified chronic kidney disease: Secondary | ICD-10-CM | POA: Diagnosis not present

## 2020-02-10 DIAGNOSIS — I5042 Chronic combined systolic (congestive) and diastolic (congestive) heart failure: Secondary | ICD-10-CM | POA: Diagnosis not present

## 2020-02-10 DIAGNOSIS — N1831 Chronic kidney disease, stage 3a: Secondary | ICD-10-CM | POA: Diagnosis not present

## 2020-02-10 DIAGNOSIS — G903 Multi-system degeneration of the autonomic nervous system: Secondary | ICD-10-CM | POA: Diagnosis not present

## 2020-02-10 LAB — BASIC METABOLIC PANEL
Anion gap: 5 (ref 5–15)
BUN: 23 mg/dL (ref 8–23)
CO2: 24 mmol/L (ref 22–32)
Calcium: 8.3 mg/dL — ABNORMAL LOW (ref 8.9–10.3)
Chloride: 96 mmol/L — ABNORMAL LOW (ref 98–111)
Creatinine, Ser: 1.12 mg/dL (ref 0.61–1.24)
GFR, Estimated: >60 mL/min (ref >60)
Glucose, Bld: 110 mg/dL — ABNORMAL HIGH (ref 70–99)
Potassium: 3.9 mmol/L (ref 3.5–5.1)
Sodium: 125 mmol/L — ABNORMAL LOW (ref 135–145)

## 2020-02-10 LAB — HEMOGLOBIN AND HEMATOCRIT, BLOOD
HCT: 30.5 % — ABNORMAL LOW (ref 39.0–52.0)
Hemoglobin: 10.7 g/dL — ABNORMAL LOW (ref 13.0–17.0)

## 2020-02-10 LAB — PHOSPHORUS: Phosphorus: 2.8 mg/dL (ref 2.5–4.6)

## 2020-02-10 LAB — GLUCOSE, CAPILLARY: Glucose-Capillary: 104 mg/dL — ABNORMAL HIGH (ref 70–99)

## 2020-02-10 LAB — MAGNESIUM: Magnesium: 1.8 mg/dL (ref 1.7–2.4)

## 2020-02-10 MED ORDER — HALOPERIDOL LACTATE 5 MG/ML IJ SOLN
INTRAMUSCULAR | Status: AC
  Administered 2020-02-10: 5 mg
  Filled 2020-02-10: qty 1

## 2020-02-10 MED ORDER — HALOPERIDOL LACTATE 5 MG/ML IJ SOLN: 5.0000 mg | Freq: Once | INTRAMUSCULAR | Status: AC

## 2020-02-10 MED ORDER — HALOPERIDOL LACTATE 5 MG/ML IJ SOLN: 3.0000 mg | Freq: Four times a day (QID) | INTRAMUSCULAR | Status: DC | PRN

## 2020-02-10 NOTE — Significant Event (Addendum)
1843 Respond to bed alarms with pt noted stand at bedside while removing gown and TELE monitor. Attempt to sit pt in chair but pt verbalizes that he feels like he is about to "pass out", RN and NT assist pt back to bed and he then became unresponsive, code blue activated for assist due to limited staffs. Pt still with pulse, systolic b/p in the 314H (please see VS on flow sheet). MD paged, Code team at bedside, O2 applied, CBG at 104. Pt then starting to gain consciousness and response to voice.   Dr. Nevada Crane at bedside order for EKG. At this time, pt response voice and opens eyes spontaneously,  Report to oncoming shift to notify Dr. Nevada Crane of EKG result.

## 2020-02-10 NOTE — Progress Notes (Signed)
Progress Note  Patient Name: Adam Clements Date of Encounter: 02/10/2020  [facility] Cardiologist: Virl Axe, MD   Subjective   Wants to go home. Posturals yesterday with systolic dropping from 237 mmHg to 100 mmHg  Inpatient Medications    Scheduled Meds:  atomoxetine  20 mg Oral TID   dipyridamole-aspirin  1 capsule Oral BID   enoxaparin (LOVENOX) injection  40 mg Subcutaneous Q24H   pravastatin  20 mg Oral q1800   sodium chloride  1 g Oral TID WC   vitamin B-12  500 mcg Oral Q M,W,F   Continuous Infusions:  PRN Meds: acetaminophen **OR** acetaminophen, labetalol   Vital Signs    Vitals:   02/09/20 0704 02/09/20 1707 02/09/20 2301 02/10/20 0454  BP: 131/73 (!) 145/98 140/88 138/77  Pulse: 65 68 88 78  Resp:  18 20 18   Temp:  (!) 97.5 F (36.4 C) 98.7 F (37.1 C) 98 F (36.7 C)  TempSrc:  Oral Oral Oral  SpO2:  98% 97% 95%    Intake/Output Summary (Last 24 hours) at 02/10/2020 0928 Last data filed at 02/10/2020 0800 Gross per 24 hour  Intake 1248.34 ml  Output 2450 ml  Net -1201.66 ml   Last 3 Weights 01/06/2020 10/31/2019 10/11/2019  Weight (lbs) 145 lb 9 oz 145 lb 7 oz 149 lb 9.6 oz  Weight (kg) 66.027 kg 65.97 kg 67.858 kg      Telemetry    Sinus rhythm.  PVCs.  Short runs of NSVT - Personally Reviewed  ECG    11/17 SR RBBB no acute changes   Physical Exam   Chronically ill white male Poor dentition Emaciated No murmur  Lungs clear No edema Palpable DP bilaterally    Labs    High Sensitivity Troponin:   Recent Labs  Lab 02/07/20 2049 02/07/20 2233 02/08/20 0503  TROPONINIHS 50* 50* 61*      Chemistry Recent Labs  Lab 02/07/20 2049 02/08/20 0245 02/08/20 0821 02/09/20 0210 02/10/20 0139  NA 125*   < > 128* 126* 125*  K 3.7   < > 4.0 4.1 3.9  CL 93*   < > 98 97* 96*  CO2 22   < > 21* 23 24  GLUCOSE 128*   < > 125* 94 110*  BUN 36*   < > 33* 28* 23  CREATININE 1.64*   < > 1.33* 1.28* 1.12  CALCIUM 8.5*    < > 8.7* 8.4* 8.3*  PROT 5.9*  --   --   --   --   ALBUMIN 3.2*  --   --   --   --   AST 20  --   --   --   --   ALT 13  --   --   --   --   ALKPHOS 48  --   --   --   --   BILITOT 0.6  --   --   --   --   GFRNONAA 41*   < > 53* 56* >60  ANIONGAP 10   < > 9 6 5    < > = values in this interval not displayed.     Hematology Recent Labs  Lab 02/07/20 2049 02/07/20 2049 02/08/20 0245 02/09/20 0210 02/10/20 0139  WBC 6.2  --  5.2 5.6  --   RBC 3.39*  --  3.10* 3.20*  --   HGB 10.8*   < > 10.0* 10.1* 10.7*  HCT 31.8*   < >  28.9* 29.0* 30.5*  MCV 93.8  --  93.2 90.6  --   MCH 31.9  --  32.3 31.6  --   MCHC 34.0  --  34.6 34.8  --   RDW 11.9  --  11.9 11.8  --   PLT 209  --  211 211  --    < > = values in this interval not displayed.    BNPNo results for input(s): BNP, PROBNP in the last 168 hours.   DDimer No results for input(s): DDIMER in the last 168 hours.   Radiology      Cardiac Studies   Echo 02/08/20: 1. Left ventricular ejection fraction, by estimation, is 35 to 40%. The  left ventricle has moderately decreased function. The left ventricle  demonstrates global hypokinesis. There is severe concentric left  ventricular hypertrophy. Left ventricular  diastolic parameters are indeterminate.  2. Right ventricular systolic function is normal. The right ventricular  size is normal.  3. The mitral valve is grossly normal. Trivial mitral valve  regurgitation.  4. The aortic valve was not well visualized. Aortic valve regurgitation  is not visualized. No aortic stenosis is present.  5. Aortic dilatation noted. There is mild dilatation of the aortic root,  measuring 40 mm. There is borderline dilatation of the ascending aorta,  measuring 37 mm.   Patient Profile     67M with chronic systolic and diastolic heart failure, carotid stenosis, TIA, autonomic failure with recurrent neurogenic syncope and supine hypertension admitted with recurrent  syncope  Assessment & Plan    Postural Hypotension:  [employer] increased to tid yesterday. Gentle hydration not volume overloaded despite moderate decrease in EF No goal directed medical Rx for low EF due to autonomic dysfunction Northera was cost prohibitive Continue abdominal binder and compression stockings  May be worthwhile to add salt tablets despite low EF as he is dry appearing and IVC not dilated on echo Would initially avoid florinef if possible. TSH, cortisol ok No chest pain with normal ECG no indication for further ischemic evaluation. No acute cardiac issues we will sign off. F/U with primary , [facility] and neurology       For questions or updates, please contact [facility] Please consult [email or web address] for contact info under        Signed, Jenkins Rouge, MD  02/10/2020, 9:28 AM

## 2020-02-10 NOTE — Progress Notes (Signed)
PROGRESS NOTE    ARIUS HARNOIS  GPQ:[id number] DOB: 11/08/1934 DOA: 02/07/2020 PCP: Ria Bush, MD   Brief Narrative:  This 85 y.o.malewithhistory of neurogenic orthostatic hypotension, nonischemic cardiomyopathy last EF measured was 35 to 40% with grade 2 diastolic dysfunction, supine hypertension, TIA, chronic anemia, chronic hyponatremia, chronic kidney disease stage III was brought to the ER after patient had at least 2 episodes of syncope at home.  He was recently started on Strattera by his cardiologist for autonomic orthostatic hypotension. Patient is admitted for syncope.  MRI head unremarkable.  Patient has chronic hyponatremia. PT and OT recommended nursing home placement.   Assessment & Plan:   Principal Problem:   Syncope and collapse Active Problems:   NICM (nonischemic cardiomyopathy) (HCC)   Autonomic failure   Near syncope  1. Syncope with history of autonomic orthostatic hypotension : recently started on Strattera.  Given the worsening function,  MRI brain was done which was unremarkable.   PT and OT recommended skilled nursing facility.  Continue monitoring in telemetry , Cardiology consulted recommended increasing the dose of Strattera given worsening orthostatic hypotension.  Repeat echocardiogram showed EF 35 to 40%.      Cardiology recommended continuing Strattera, and outpatient follow-up with neurology.  2. Hyponatremia appears to be chronic at this time.    Patient has received fluid bolus.  Follow metabolic panel closely.  Check urine studies.    TSH and cortisol normal. Started salt tablet , Na 125  3. History of TIA: Continue Aggrenox and statins.  4. Chronic anemia :  being followed by patient's primary care physician.  Hemoglobin appears to be at baseline.  5. Supine hypertension as per patient's cardiology notes was recently started on hydralazine at bedtime.    6. Nonischemic cardiomyopathy: appears compensated.   DVT prophylaxis:   Lovenox Code Status:Full Family Communication: Spoke with wife on the phone Disposition Plan: Status is: Inpatient  Remains inpatient appropriate because:Inpatient level of care appropriate due to severity of illness   Dispo: The patient is from: Home              Anticipated d/c is to: SNF              Anticipated d/c date is: 2 days              Patient currently is not medically stable to d/c.  Consultants:   Cardiology  Procedures:  None Antimicrobials:   Anti-infectives (From admission, onward)   None      Subjective: Patient was seen and examined at bedside.  No overnight events.  Patient appears very alert, oriented x2, having breakfast.  He denies any dizziness.  He has participated in physical therapy yesterday and became significantly dizzy and lightheaded.  His systolic blood pressure dropped from 150 - 100.  Objective: Vitals:   02/09/20 0704 02/09/20 1707 02/09/20 2301 02/10/20 0454  BP: 131/73 (!) 145/98 140/88 138/77  Pulse: 65 68 88 78  Resp:  18 20 18   Temp:  (!) 97.5 F (36.4 C) 98.7 F (37.1 C) 98 F (36.7 C)  TempSrc:  Oral Oral Oral  SpO2:  98% 97% 95%    Intake/Output Summary (Last 24 hours) at 02/10/2020 1103 Last data filed at 02/10/2020 0800 Gross per 24 hour  Intake 1248.34 ml  Output 2450 ml  Net -1201.66 ml   There were no vitals filed for this visit.  Examination:  General exam: Appears calm and comfortable, not in any acute distress Respiratory  system: Clear to auscultation. Respiratory effort normal. Cardiovascular system: S1 & S2 heard, RRR. No JVD, murmurs, rubs, gallops or clicks. No pedal edema. Gastrointestinal system: Abdomen is nondistended, soft and nontender. No organomegaly or masses felt. Normal bowel sounds heard. Central nervous system: Alert and oriented x 2. No focal neurological deficits. Extremities:  No edema, No cyanosis, no clubbing Skin: No rashes, lesions or ulcers Psychiatry: Good judgment and  assessment.    Data Reviewed: I have personally reviewed following labs and imaging studies  CBC: Recent Labs  Lab 02/07/20 2049 02/08/20 0245 02/09/20 0210 02/10/20 0139  WBC 6.2 5.2 5.6  --   NEUTROABS 4.8  --   --   --   HGB 10.8* 10.0* 10.1* 10.7*  HCT 31.8* 28.9* 29.0* 30.5*  MCV 93.8 93.2 90.6  --   PLT 209 211 211  --    Basic Metabolic Panel: Recent Labs  Lab 02/07/20 2049 02/07/20 2049 02/08/20 0245 02/08/20 0503 02/08/20 0821 02/09/20 0210 02/10/20 0139  NA 125*  --   --  129* 128* 126* 125*  K 3.7  --   --  3.5 4.0 4.1 3.9  CL 93*  --   --  102 98 97* 96*  CO2 22  --   --  20* 21* 23 24  GLUCOSE 128*  --   --  92 125* 94 110*  BUN 36*  --   --  33* 33* 28* 23  CREATININE 1.64*   < > 1.45* 1.26* 1.33* 1.28* 1.12  CALCIUM 8.5*  --   --  7.8* 8.7* 8.4* 8.3*  MG  --   --   --   --   --  1.4* 1.8  PHOS  --   --   --   --   --  2.5 2.8   < > = values in this interval not displayed.   GFR: CrCl cannot be calculated (Unknown ideal weight.). Liver Function Tests: Recent Labs  Lab 02/07/20 2049  AST 20  ALT 13  ALKPHOS 48  BILITOT 0.6  PROT 5.9*  ALBUMIN 3.2*   No results for input(s): LIPASE, AMYLASE in the last 168 hours. No results for input(s): AMMONIA in the last 168 hours. Coagulation Profile: No results for input(s): INR, PROTIME in the last 168 hours. Cardiac Enzymes: No results for input(s): CKTOTAL, CKMB, CKMBINDEX, TROPONINI in the last 168 hours. BNP (last 3 results) No results for input(s): PROBNP in the last 8760 hours. HbA1C: No results for input(s): HGBA1C in the last 72 hours. CBG: No results for input(s): GLUCAP in the last 168 hours. Lipid Profile: No results for input(s): CHOL, HDL, LDLCALC, TRIG, CHOLHDL, LDLDIRECT in the last 72 hours. Thyroid Function Tests: Recent Labs    02/08/20 0503  TSH 1.666   Anemia Panel: No results for input(s): VITAMINB12, FOLATE, FERRITIN, TIBC, IRON, RETICCTPCT in the last 72  hours. Sepsis Labs: No results for input(s): PROCALCITON, LATICACIDVEN in the last 168 hours.  Recent Results (from the past 240 hour(s))  Respiratory Panel by RT PCR (Flu A&B, Covid) - Nasopharyngeal Swab     Status: None   Collection Time: 02/07/20  9:15 PM   Specimen: Nasopharyngeal Swab  Result Value Ref Range Status   SARS Coronavirus 2 by RT PCR NEGATIVE NEGATIVE Final    Comment: (NOTE) SARS-CoV-2 target nucleic acids are NOT DETECTED.  The SARS-CoV-2 RNA is generally detectable in upper respiratoy specimens during the acute phase of infection. The lowest concentration of  SARS-CoV-2 viral copies this assay can detect is 131 copies/mL. A negative result does not preclude SARS-Cov-2 infection and should not be used as the sole basis for treatment or other patient management decisions. A negative result may occur with  improper specimen collection/handling, submission of specimen other than nasopharyngeal swab, presence of viral mutation(s) within the areas targeted by this assay, and inadequate number of viral copies (<131 copies/mL). A negative result must be combined with clinical observations, patient history, and epidemiological information. The expected result is Negative.  Fact Sheet for Patients:  PinkCheek.be  Fact Sheet for Healthcare Providers:  GravelBags.it  This test is no t yet approved or cleared by the [place] FDA and  has been authorized for detection and/or diagnosis of SARS-CoV-2 by FDA under an Emergency Use Authorization (EUA). This EUA will remain  in effect (meaning this test can be used) for the duration of the COVID-19 declaration under Section 564(b)(1) of the Act, 21 U.S.C. section 360bbb-3(b)(1), unless the authorization is terminated or revoked sooner.     Influenza A by PCR NEGATIVE NEGATIVE Final   Influenza B by PCR NEGATIVE NEGATIVE Final    Comment: (NOTE) The Xpert Xpress  SARS-CoV-2/FLU/RSV assay is intended as an aid in  the diagnosis of influenza from Nasopharyngeal swab specimens and  should not be used as a sole basis for treatment. Nasal washings and  aspirates are unacceptable for Xpert Xpress SARS-CoV-2/FLU/RSV  testing.  Fact Sheet for Patients: PinkCheek.be  Fact Sheet for Healthcare Providers: GravelBags.it  This test is not yet approved or cleared by the [place] FDA and  has been authorized for detection and/or diagnosis of SARS-CoV-2 by  FDA under an Emergency Use Authorization (EUA). This EUA will remain  in effect (meaning this test can be used) for the duration of the  Covid-19 declaration under Section 564(b)(1) of the Act, 21  U.S.C. section 360bbb-3(b)(1), unless the authorization is  terminated or revoked. Performed at [facility], [place] [address] [place], [place] [address]      Radiology Studies: ECHOCARDIOGRAM COMPLETE  Result Date: 02/08/2020    ECHOCARDIOGRAM REPORT   Patient Name:   HAYWOOD MEINDERS Date of Exam: 02/08/2020 Medical Rec #:  [id number]       Height:       67.2 in Accession #:    [id number]      Weight:       145.6 lb Date of Birth:  09-23-1934        BSA:          1.771 m Patient Age:    36 years        BP:           151/94 mmHg Patient Gender: M               HR:           65 bpm. Exam Location:  Inpatient Procedure: 2D Echo, Cardiac Doppler and Color Doppler Indications:    Syncope  History:        Patient has prior history of Echocardiogram examinations, most                 recent 10/19/2017. Mitral Valve Disease, Arrythmias:RBBB and PVC,                 Signs/Symptoms:Syncope; Risk Factors:Hypertension and Former                 Smoker. Non-ischemic cardiomyopathy, HFrEF, PAD.  Sonographer:    Clayton Lefort RDCS (AE) Referring Phys: [place] Comments: Suboptimal subcostal window. IMPRESSIONS  1. Left ventricular ejection  fraction, by estimation, is 35 to 40%. The left ventricle has moderately decreased function. The left ventricle demonstrates global hypokinesis. There is severe concentric left ventricular hypertrophy. Left ventricular diastolic parameters are indeterminate.  2. Right ventricular systolic function is normal. The right ventricular size is normal.  3. The mitral valve is grossly normal. Trivial mitral valve regurgitation.  4. The aortic valve was not well visualized. Aortic valve regurgitation is not visualized. No aortic stenosis is present.  5. Aortic dilatation noted. There is mild dilatation of the aortic root, measuring 40 mm. There is borderline dilatation of the ascending aorta, measuring 37 mm. Comparison(s): A prior study was performed on 10/20/19. Compared to prior: similar LV function and stroke volume. Slight dilation of the aortic root. Calcifed papillary muscle stable from 2019 study. FINDINGS  Left Ventricle: Left ventricular ejection fraction, by estimation, is 35 to 40%. The left ventricle has moderately decreased function. The left ventricle demonstrates global hypokinesis. The left ventricular internal cavity size was normal in size. There is severe concentric left ventricular hypertrophy. Left ventricular diastolic parameters are indeterminate. Right Ventricle: The right ventricular size is normal. No increase in right ventricular wall thickness. Right ventricular systolic function is normal. Left Atrium: Left atrial size was normal in size. Right Atrium: Right atrial size was normal in size. Pericardium: There is no evidence of pericardial effusion. Mitral Valve: The mitral valve is grossly normal. Mild mitral annular calcification. Trivial mitral valve regurgitation. Tricuspid Valve: The tricuspid valve is normal in structure. Tricuspid valve regurgitation is not demonstrated. Aortic Valve: The aortic valve was not well visualized. Aortic valve regurgitation is not visualized. No aortic stenosis  is present. Aortic valve mean gradient measures 3.0 mmHg. Aortic valve peak gradient measures 5.8 mmHg. Aortic valve area, by VTI measures 1.83 cm. Pulmonic Valve: The pulmonic valve was not well visualized. Pulmonic valve regurgitation is not visualized. Aorta: Aortic dilatation noted. There is mild dilatation of the aortic root, measuring 40 mm. There is borderline dilatation of the ascending aorta, measuring 37 mm. IAS/Shunts: The atrial septum is grossly normal.  LEFT VENTRICLE PLAX 2D LVIDd:         4.70 cm LVIDs:         4.30 cm LV PW:         1.50 cm LV IVS:        1.60 cm LVOT diam:     1.90 cm LV SV:         42 LV SV Index:   24 LVOT Area:     2.84 cm  LV Volumes (MOD) LV vol d, MOD A2C: 116.0 ml LV vol d, MOD A4C: 154.0 ml LV vol s, MOD A2C: 63.3 ml LV vol s, MOD A4C: 107.0 ml LV SV MOD A2C:     52.7 ml LV SV MOD A4C:     154.0 ml LV SV MOD BP:      52.6 ml RIGHT VENTRICLE RV Basal diam:  3.50 cm RV Mid diam:    2.40 cm RV S prime:     10.60 cm/s TAPSE (M-mode): 2.2 cm LEFT ATRIUM           Index       RIGHT ATRIUM           Index LA diam:      3.80 cm 2.15 cm/m  RA Area:     17.60 cm LA Vol (A2C): 47.2 ml 26.65 ml/m RA Volume:   50.60 ml  28.57 ml/m LA Vol (A4C): 55.7 ml 31.45 ml/m  AORTIC VALVE AV Area (Vmax):    1.58 cm AV Area (Vmean):   1.47 cm AV Area (VTI):     1.83 cm AV Vmax:           120.00 cm/s AV Vmean:          74.700 cm/s AV VTI:            0.228 m AV Peak Grad:      5.8 mmHg AV Mean Grad:      3.0 mmHg LVOT Vmax:         66.70 cm/s LVOT Vmean:        38.600 cm/s LVOT VTI:          0.147 m LVOT/AV VTI ratio: 0.64  AORTA Ao Root diam: 4.00 cm Ao Asc diam:  3.70 cm  SHUNTS Systemic VTI:  0.15 m Systemic Diam: 1.90 cm Rudean Haskell MD Electronically signed by Rudean Haskell MD Signature Date/Time: 02/08/2020/5:00:16 PM    Final     Scheduled Meds: . atomoxetine  20 mg Oral TID  . dipyridamole-aspirin  1 capsule Oral BID  . enoxaparin (LOVENOX) injection  40 mg  Subcutaneous Q24H  . pravastatin  20 mg Oral q1800  . sodium chloride  1 g Oral TID WC  . vitamin B-12  500 mcg Oral Q M,W,F   Continuous Infusions:   LOS: 1 day    Time spent: 25 mins    Shawna Clamp, MD [facility]   If 7PM-7AM, please contact night-coverage

## 2020-02-10 NOTE — Progress Notes (Signed)
Updated pt's POC to wife at bedside. Mrs. Near voices that she is capable of updating pt's daughter's [name] of what is going on.

## 2020-02-10 NOTE — Significant Event (Signed)
Rapid Response Event Note   Reason for Call :  Pt got up from bed and became unresponsive. Pt had pulse and was spontaneously breathing. Pt admitted for postural hypotension, he is orthostatic positive.   Initial Focused Assessment:  Pt lying in bed. Responds to voice, oriented to person. Breathing is regular, unlabored. Skin is warm, dry- skin complexion is pink.   VS: T 97.66F, BP 206/112, HR 93, RR 22, SpO2 99% on 2LNC  Interventions:  No intervention from [place] of Care:  -Fall precautions -Bed rest -Wean oxygen as pt tolerates  Call rapid response for additional needs  Event Summary:  MD Notified: Dr. Dwyane Dee Call Time: 1846 Arrival Time: 1852 End Time: 1910  Casimer Bilis, RN

## 2020-02-10 NOTE — Progress Notes (Signed)
Notified MD Dwyane Dee via amion that pt was found unresponsive with pulse and breathing. RRT was called.  Havy, RN and RRT at the bedside.

## 2020-02-10 NOTE — Telephone Encounter (Signed)
Spoke with pt's wife, DPR and advised Dr Caryl Comes has been notified pt has been admitted to the hospital.  Pt's wife states pt is to be transferred to skilled nursing, [facility] tomorrow.  She reports pt is much brighter today and thinking is clearer.  Pt's wife thanked Therapist, sports for the call.

## 2020-02-10 NOTE — TOC Progression Note (Signed)
Transition of Care [facility]) - Progression Note    Patient Details  Name: Adam Clements MRN: [id number] Date of Birth: September 03, 1934  Transition [facility] Care [facility] CM/SW [place], [place] Phone Number: 02/10/2020, 10:57 AM  Clinical Narrative:     CSW spoke with pt wife Adam Clements about SNF offers. Family chooses [facility]. CSW explains insurance auth process. Pt will likely be medically stable tomorrow. D/C pending insurance auth.   CSW confirmed with Loi from [facility] of bed offer. Silva Bandy will start insurance auth.   Expected Discharge Plan: [place] Barriers to Discharge: Continued Medical Work up, SNF Pending bed offer  Expected Discharge Plan and Services Expected Discharge Plan: [place] arrangements for the past 2 months: Single Family Home                                       Social Determinants of Health (SDOH) Interventions    Readmission Risk Interventions No flowsheet data found.

## 2020-02-10 NOTE — Telephone Encounter (Signed)
[name] is calling stating some "paculiar" things are going on at the hospital in regards to Adam Clements. She states they advised her and her daughter that they cannot stand [name] up to get his BP because he'll fall down. She also states he was supposed to get a test, but they stated there was no one to do it so it was not preformed, and that they are very confused by their finds. Please advise.

## 2020-02-11 DIAGNOSIS — G909 Disorder of the autonomic nervous system, unspecified: Secondary | ICD-10-CM | POA: Diagnosis not present

## 2020-02-11 DIAGNOSIS — R55 Syncope and collapse: Secondary | ICD-10-CM | POA: Diagnosis not present

## 2020-02-11 LAB — BASIC METABOLIC PANEL
Anion gap: 6 (ref 5–15)
BUN: 23 mg/dL (ref 8–23)
CO2: 24 mmol/L (ref 22–32)
Calcium: 8.3 mg/dL — ABNORMAL LOW (ref 8.9–10.3)
Chloride: 93 mmol/L — ABNORMAL LOW (ref 98–111)
Creatinine, Ser: 1.09 mg/dL (ref 0.61–1.24)
GFR, Estimated: >60 mL/min (ref >60)
Glucose, Bld: 110 mg/dL — ABNORMAL HIGH (ref 70–99)
Potassium: 4.3 mmol/L (ref 3.5–5.1)
Sodium: 123 mmol/L — ABNORMAL LOW (ref 135–145)

## 2020-02-11 LAB — MAGNESIUM: Magnesium: 1.6 mg/dL — ABNORMAL LOW (ref 1.7–2.4)

## 2020-02-11 LAB — PHOSPHORUS: Phosphorus: 2.7 mg/dL (ref 2.5–4.6)

## 2020-02-11 MED ORDER — MAGNESIUM SULFATE 2 GM/50ML IV SOLN
2.0000 g | Freq: Once | INTRAVENOUS | Status: AC
  Administered 2020-02-11: 2 g via INTRAVENOUS
  Filled 2020-02-11: qty 50

## 2020-02-11 MED ORDER — SODIUM CHLORIDE 1 G PO TABS
2.0000 g | ORAL_TABLET | Freq: Three times a day (TID) | ORAL | Status: DC
  Administered 2020-02-11 – 2020-02-22 (×31): 2 g via ORAL
  Filled 2020-02-11 (×36): qty 2

## 2020-02-11 MED ORDER — SODIUM CHLORIDE 0.9 % IV BOLUS
500.0000 mL | Freq: Once | INTRAVENOUS | Status: AC
  Administered 2020-02-11: 500 mL via INTRAVENOUS

## 2020-02-11 NOTE — Consult Note (Addendum)
Neurology Consultation Reason for Consult: orthostatic hypotension Referring Physician: Dr. Dwyane Dee   CC: Loss of consciousness  History is obtained from: patient's chart, family at bedside and from family at home via phone call.  HPI: Adam Clements is an 85 y.o. male with history of neurogenic orthostatic hypotension, nonischemic cardiomyopathy (last EF measured was 35 to 40%) with grade 2 diastolic dysfunction, supine hypertension, TIA, chronic anemia, chronic hyponatremia, chronic kidney disease stage III was brought to the ER after patient had at least 2 episodes of syncope occuring on the day prior to admission and at least 6 episodes in the week prior to admission. He has a chronic history of orthostatic hypotension and is being managed by his primary physician and cardiologist for same. He has been placed on Strattera and hydralazine by cardiology.   Reports indicate that day prior to admission he was trying to go to the bathroom, but lost consciousness. This occurred again later in the evening, and that time he hit his head and was brought to the ER.  CT head showed no acute intracranial abnormality, but did show chronic microvascular ischemic changes and prior lacunar type infarcts. CT neck done showed no acute fracture but did show multilevel foraminal narrowing. MRI brian showed cortical atrophy but no acute abnormalities.    Neurology was called regarding this chronic diagnosis of neurogenic orthostatic hypotension, and we thank the primary team for the kind consult.   ROS: A 14 point ROS was performed and is negative except as noted in the    Past Medical History:  Diagnosis Date  . Allergic rhinitis   . Arthritis    Gout  . BPH (benign prostatic hypertrophy)   . Bradycardia   . CAD (coronary artery disease)    nonobstructive  . Chronic anemia 2013   h/o IDA, did not tolerate oral iron, latest normal iron studies, B12, folate  . Diverticulosis of colon    conoscopy 12/1993  (Dr. Amedeo Plenty) S/G divertics//colonoscopy L&R divertics (Dr. Amedeo Plenty) 11/08/2004  . Dizziness   . Gout   . HTN (hypertension)   . Memory loss   . Mitral regurgitation    Pulmonic regurgitation, with marked LA enlargement  . Nonischemic cardiomyopathy (HCC)    EF 40%  . Orthostatic hypotension   . PVC (premature ventricular contraction)   . PVD (peripheral vascular disease) ([place])    followed by Dr Trula Slade, treating medically (pletal and aspirin)  . RBBB longstanding  . Squamous cell carcinoma in situ of skin 2015   L ear  . Syncope       Family History  Problem Relation Age of Onset  . Heart disease Mother 62       Natural causes with CHF  . Hypertension Mother   . Hypertension Father   . Stroke Father        cerebral hemorrhage  . Hyperlipidemia Brother   . Heart disease Brother        before age 50  . Hypertension Brother   . Heart attack Brother   . Hypertension Other       Social History:  reports that he quit smoking about 37 years ago. His smoking use included cigarettes. He smoked 2.00 packs per day. He has quit using smokeless tobacco. He reports current alcohol use of about 7.0 standard drinks of alcohol per week. He reports that he does not use drugs.   Home medications: No current facility-administered medications on file prior to encounter.   Current Outpatient Medications  on File Prior to Encounter  Medication Sig Dispense Refill  . atomoxetine (STRATTERA) 10 MG capsule Take 2 capsules (20 mg total) by mouth 2 (two) times daily. 120 capsule 0  . cholecalciferol (VITAMIN D) 1000 units tablet Take 1,000 Units by mouth daily.    Marland Kitchen dipyridamole-aspirin (AGGRENOX) 200-25 MG 12hr capsule TAKE 1 CAPSULE BY MOUTH TWICE A DAY (Patient taking differently: Take 1 capsule by mouth 2 (two) times daily. ) 60 capsule 11  . pravastatin (PRAVACHOL) 20 MG tablet TAKE 1 TABLET(20 MG) BY MOUTH DAILY (Patient taking differently: Take 20 mg by mouth daily. ) 90 tablet 3  . vitamin  B-12 (CYANOCOBALAMIN) 500 MCG tablet Take 1 tablet (500 mcg total) by mouth every Monday, Wednesday, and Friday.       Exam: Current vital signs: BP (!) 159/91 (BP Location: Right Arm)   Pulse 65   Temp 98.1 F (36.7 C) (Oral)   Resp 20   SpO2 100%  Vital signs in last 24 hours: Temp:  [97.7 F (36.5 C)-98.6 F (37 C)] 98.1 F (36.7 C) (11/20 1121) Pulse Rate:  [62-90] 65 (11/20 1121) Resp:  [16-22] 20 (11/20 1121) BP: (154-230)/(80-124) 159/91 (11/20 1121) SpO2:  [96 %-100 %] 100 % (11/20 1121)   Physical Exam  Constitutional: Appears well-developed and well-nourished.  Psych: Affect appropriate to situation Eyes: No scleral injection HENT: No OP obstruction. Hard of hearing.  MSK: no joint deformities.  Cardiovascular: Normal rate and regular rhythm.  Respiratory: Effort normal, non-labored breathing GI: Soft.  No distension. There is no tenderness.  Skin: WDI  Neuro: Mental Status: Patient is awake, alert, oriented to person, place, month, year, and situation.  Patient is unable to give clear and concise history.  No signs of aphasia or neglect  Cranial Nerves: II: Visual Fields are full. Pupils are equal, round, and reactive to light.    III,IV, VI: EOMI without ptosis or diplopia.  V: Facial sensation is symmetric to temperature VII: Facial movement is symmetric.  VIII: hearing is intact to voice X: Uvula elevates symmetrically XI: Shoulder shrug is symmetric. XII: tongue is midline without atrophy or fasciculations.  Motor: Tone is normal. Bulk is normal. 4+/5 strength was present in all four extremities.   Sensory: Sensation is symmetric to light touch and temperature in the arms and legs.  Deep Tendon Reflexes: 2+ and symmetric in the biceps and brachioradialis. 1+ and symmetric patellae.   Plantars: Toes are downgoing bilaterally.   Cerebellar: FNF and HKS are intact bilaterally  Gait: Deferred  I have reviewed labs in epic and the results pertinent  to this consultation are:  Results for [name], [name] (MRN [id number]) as of 02/11/2020 14:37  Ref. Range 02/09/2020 02:10  WBC Latest Ref Range: 4.0 - 10.5 K/uL 5.6  RBC Latest Ref Range: 4.22 - 5.81 MIL/uL 3.20 (L)  Hemoglobin Latest Ref Range: 13.0 - 17.0 g/dL 10.1 (L)  HCT Latest Ref Range: 39 - 52 % 29.0 (L)  MCV Latest Ref Range: 80.0 - 100.0 fL 90.6  MCH Latest Ref Range: 26.0 - 34.0 pg 31.6  MCHC Latest Ref Range: 30.0 - 36.0 g/dL 34.8  RDW Latest Ref Range: 11.5 - 15.5 % 11.8  Platelets Latest Ref Range: 150 - 400 K/uL 211    I have reviewed the images obtained:   MRI brain 02/08/20:  1. No acute or reversible finding. 2. Generalized cortical atrophy.  CT head 02/07/20: 1. No acute intracranial abnormality. 2. Some mild right temporoparietal  and left temporal scalp thickening. Additional thickening and possible laceration of the left frontal scalp as well. No calvarial fracture or acute osseous injury is identified. 3. Chronic microvascular ischemic changes, parenchymal volume loss and intracranial atherosclerosis. 4. Punctate hypoattenuating foci in the bilateral basal ganglia and external capsules compatible with prior lacunar infarcts. 5. No evidence of acute fracture or traumatic listhesis of the cervical spine. 6. Cervical spondylitic and facet degenerative changes, as above. 7. Cervical and intracranial atherosclerosis.   Impression:  85 year old male with history of neurogenic orthostatic hypotension admitted with multiple episodes of loss of consciousness. No new or significant intracranial abnormalities are seen on imaging. 1. He also has supine hypertension, which makes treatment of his neurogenic orthostatic hypotension more difficult.  2. Currently on Strattera (atomoxetine, a norepinephrine reuptake inhibitor) for his neurogenic orthostatic hypotension. Caution must be used when using this medication in cardiac patients. We will not make changes to this  medication as it is prescribed by his Cardiologist and risks of falls and decreased quality of life off this medication may be outweighed by benefits while on this medication of ameliorating his hypotensive episodes.  3. Salt tablets are an option in some patients, but given the patient's nonischemic cardiomyopathy with decreased EF, will defer to his Cardiologist regarding whether or not to prescribe salt tablets.  4. Already using compressive thigh-high stockings at home, which he removes at night.    Recommendations: 1) Continue with current medical care per primary team. 2) Obtain orthostatic blood pressure readings. 3) Continue thigh high TEDS stockings during the day while ambulatory and remove at night.  4) increased salt and water intake, if ok with cardiology.(see #9, below) 5) PT/ OT as tolerated. 6) PRN Haldol has been discontinued as this medication as well as other neuroleptics can exacerbate orthostatic hypotension 7) Some other medications to avoid in patients with orthostatic hypotension include the following: Morphine, tricycllic antidepressants, MAOIs, several antihypertensive agents, lasix, nitrates, antiparkinsonian agents, Viagra, prazosin/terazosin, and amiodarone. Of note, he is prescribed hydralazine by his Cardiologist - although this medication can worsen orthostatic hypotension, will defer to his Cardiologist regarding continuation as there may be a fine balance to be achieved between antihypertensive use and his Strattera when optimizing BP in the supine, sitting and standing positions.  8) Will defer to his Cardiologist regarding whether or not to start salt tablets.  9) Instructed patient and his daughter to start keeping a BP diary with readings taken supine in AM before getting up, in the afternoon, and at night before and after lying down for bed. He should record his body position, time of reading and heart rate for each BP reading and bring to his Cardiologist for  review.   I have seen and examined the patient. I have formulated the assessment and recommendations. 85 year old male with history of neurogenic orthostatic hypotension admitted with multiple episodes of loss of consciousness. No new or significant intracranial abnormalities are seen on imaging. Exam is nonfocal. Recommendations as above.  Electronically signed: Dr. Kerney Elbe

## 2020-02-11 NOTE — Progress Notes (Signed)
PROGRESS NOTE    WILHELM GANAWAY  ZGY:[id number] DOB: [birth year] DOA: 02/07/2020 PCP: Ria Bush, MD   Brief Narrative:  This 85 y.o.malewithhistory of neurogenic orthostatic hypotension, nonischemic cardiomyopathy last EF measured was 35 to 40% with grade 2 diastolic dysfunction, supine hypertension, TIA, chronic anemia, chronic hyponatremia, chronic kidney disease stage III was brought to the ER after patient had at least 2 episodes of syncope at home.  He was recently started on Strattera by his cardiologist for autonomic orthostatic hypotension. Patient is admitted for recurrent syncope.  MRI head unremarkable.  Patient has chronic hyponatremia. PT and OT recommended nursing home placement.   Assessment & Plan:   Principal Problem:   Syncope and collapse Active Problems:   NICM (nonischemic cardiomyopathy) (HCC)   Autonomic failure   Near syncope  1. Syncope with history of autonomic orthostatic hypotension : recently started on Strattera.  Given the worsening function, MRI brain was done which was unremarkable.   PT and OT recommended skilled nursing facility.  Continue monitoring in telemetry , Cardiology consulted recommended increasing the dose of Strattera given worsening orthostatic hypotension.  Repeat echocardiogram showed EF 35 to 40%. Cardiology recommended continuing Strattera, and outpatient follow-up with neurology.  Patient has another episode yesterday when he passed out.  Neurology consulted awaiting recommendation.  2. Hyponatremia appears to be chronic at this time.    Patient has received fluid bolus.  Follow metabolic panel closely.  Check urine studies.    TSH and cortisol normal. Started salt tablet , Na 123.  Nephrology consulted,  awaiting recommendation.  3. History of TIA: Continue Aggrenox and statins.  4. Chronic anemia :  being followed by patient's primary care physician.  Hemoglobin appears to be at baseline.  5. Supine hypertension as per  patient's cardiology notes was recently started on hydralazine at bedtime.    6. Nonischemic cardiomyopathy: appears compensated.   DVT prophylaxis:  Lovenox Code Status:Full Family Communication: Spoke with wife on the phone Disposition Plan: Status is: Inpatient  Remains inpatient appropriate because:Inpatient level of care appropriate due to severity of illness   Dispo: The patient is from: Home              Anticipated d/c is to: SNF              Anticipated d/c date is: 2 days              Patient currently is not medically stable to d/c.  Consultants:   Cardiology  Procedures:  None Antimicrobials:   Anti-infectives (From admission, onward)   None      Subjective: Patient was seen and examined at bedside.  Overnight events noted.  Patient passed out last night and became unresponsive with pulse and breathing.  He gained consciousness after some time.  Patient is alert and awake in the morning,  following commands.  Objective: Vitals:   02/11/20 0435 02/11/20 0435 02/11/20 0739 02/11/20 1121  BP: (!) 180/95 (!) 180/95 (!) 165/84 (!) 159/91  Pulse: 66 66 68 65  Resp: 18 20 20 20   Temp: 97.7 F (36.5 C) 97.7 F (36.5 C) 98.4 F (36.9 C) 98.1 F (36.7 C)  TempSrc: Axillary Oral Oral Oral  SpO2:    100%    Intake/Output Summary (Last 24 hours) at 02/11/2020 1407 Last data filed at 02/11/2020 0100 Gross per 24 hour  Intake 240 ml  Output 700 ml  Net -460 ml   There were no vitals filed for this visit.  Examination:  General exam: Appears calm and comfortable, not in any acute distress Respiratory system: Clear to auscultation. Respiratory effort normal. Cardiovascular system: S1 & S2 heard, RRR. No JVD, murmurs, rubs, gallops or clicks. No pedal edema. Gastrointestinal system: Abdomen is nondistended, soft and nontender. No organomegaly or masses felt. Normal bowel sounds heard. Central nervous system: Alert and oriented x 2. No focal neurological  deficits. Extremities:  No edema, No cyanosis, no clubbing Skin: No rashes, lesions or ulcers Psychiatry: Good judgment and assessment.    Data Reviewed: I have personally reviewed following labs and imaging studies  CBC: Recent Labs  Lab 02/07/20 2049 02/08/20 0245 02/09/20 0210 02/10/20 0139  WBC 6.2 5.2 5.6  --   NEUTROABS 4.8  --   --   --   HGB 10.8* 10.0* 10.1* 10.7*  HCT 31.8* 28.9* 29.0* 30.5*  MCV 93.8 93.2 90.6  --   PLT 209 211 211  --    Basic Metabolic Panel: Recent Labs  Lab 02/08/20 0503 02/08/20 0821 02/09/20 0210 02/10/20 0139 02/11/20 0128  NA 129* 128* 126* 125* 123*  K 3.5 4.0 4.1 3.9 4.3  CL 102 98 97* 96* 93*  CO2 20* 21* 23 24 24   GLUCOSE 92 125* 94 110* 110*  BUN 33* 33* 28* 23 23  CREATININE 1.26* 1.33* 1.28* 1.12 1.09  CALCIUM 7.8* 8.7* 8.4* 8.3* 8.3*  MG  --   --  1.4* 1.8 1.6*  PHOS  --   --  2.5 2.8 2.7   GFR: CrCl cannot be calculated (Unknown ideal weight.). Liver Function Tests: Recent Labs  Lab 02/07/20 2049  AST 20  ALT 13  ALKPHOS 48  BILITOT 0.6  PROT 5.9*  ALBUMIN 3.2*   No results for input(s): LIPASE, AMYLASE in the last 168 hours. No results for input(s): AMMONIA in the last 168 hours. Coagulation Profile: No results for input(s): INR, PROTIME in the last 168 hours. Cardiac Enzymes: No results for input(s): CKTOTAL, CKMB, CKMBINDEX, TROPONINI in the last 168 hours. BNP (last 3 results) No results for input(s): PROBNP in the last 8760 hours. HbA1C: No results for input(s): HGBA1C in the last 72 hours. CBG: Recent Labs  Lab 02/10/20 1849  GLUCAP 104*   Lipid Profile: No results for input(s): CHOL, HDL, LDLCALC, TRIG, CHOLHDL, LDLDIRECT in the last 72 hours. Thyroid Function Tests: No results for input(s): TSH, T4TOTAL, FREET4, T3FREE, THYROIDAB in the last 72 hours. Anemia Panel: No results for input(s): VITAMINB12, FOLATE, FERRITIN, TIBC, IRON, RETICCTPCT in the last 72 hours. Sepsis Labs: No results  for input(s): PROCALCITON, LATICACIDVEN in the last 168 hours.  Recent Results (from the past 240 hour(s))  Respiratory Panel by RT PCR (Flu A&B, Covid) - Nasopharyngeal Swab     Status: None   Collection Time: 02/07/20  9:15 PM   Specimen: Nasopharyngeal Swab  Result Value Ref Range Status   SARS Coronavirus 2 by RT PCR NEGATIVE NEGATIVE Final    Comment: (NOTE) SARS-CoV-2 target nucleic acids are NOT DETECTED.  The SARS-CoV-2 RNA is generally detectable in upper respiratoy specimens during the acute phase of infection. The lowest concentration of SARS-CoV-2 viral copies this assay can detect is 131 copies/mL. A negative result does not preclude SARS-Cov-2 infection and should not be used as the sole basis for treatment or other patient management decisions. A negative result may occur with  improper specimen collection/handling, submission of specimen other than nasopharyngeal swab, presence of viral mutation(s) within the areas targeted by this  assay, and inadequate number of viral copies (<131 copies/mL). A negative result must be combined with clinical observations, patient history, and epidemiological information. The expected result is Negative.  Fact Sheet for Patients:  PinkCheek.be  Fact Sheet for Healthcare Providers:  GravelBags.it  This test is no t yet approved or cleared by the [place] FDA and  has been authorized for detection and/or diagnosis of SARS-CoV-2 by FDA under an Emergency Use Authorization (EUA). This EUA will remain  in effect (meaning this test can be used) for the duration of the COVID-19 declaration under Section 564(b)(1) of the Act, 21 U.S.C. section 360bbb-3(b)(1), unless the authorization is terminated or revoked sooner.     Influenza A by PCR NEGATIVE NEGATIVE Final   Influenza B by PCR NEGATIVE NEGATIVE Final    Comment: (NOTE) The Xpert Xpress SARS-CoV-2/FLU/RSV assay is  intended as an aid in  the diagnosis of influenza from Nasopharyngeal swab specimens and  should not be used as a sole basis for treatment. Nasal washings and  aspirates are unacceptable for Xpert Xpress SARS-CoV-2/FLU/RSV  testing.  Fact Sheet for Patients: PinkCheek.be  Fact Sheet for Healthcare Providers: GravelBags.it  This test is not yet approved or cleared by the [place] FDA and  has been authorized for detection and/or diagnosis of SARS-CoV-2 by  FDA under an Emergency Use Authorization (EUA). This EUA will remain  in effect (meaning this test can be used) for the duration of the  Covid-19 declaration under Section 564(b)(1) of the Act, 21  U.S.C. section 360bbb-3(b)(1), unless the authorization is  terminated or revoked. Performed at [facility], [place] [address] [place], [place] [address]      Radiology Studies: No results found.  Scheduled Meds: . atomoxetine  20 mg Oral TID  . dipyridamole-aspirin  1 capsule Oral BID  . enoxaparin (LOVENOX) injection  40 mg Subcutaneous Q24H  . pravastatin  20 mg Oral q1800  . sodium chloride  1 g Oral TID WC  . vitamin B-12  500 mcg Oral Q M,W,F   Continuous Infusions: . sodium chloride       LOS: 2 days    Time spent: 25 mins    Shawna Clamp, MD [facility]   If 7PM-7AM, please contact night-coverage

## 2020-02-11 NOTE — Consult Note (Addendum)
Renal Service Consult Note [facility]  Adam Clements 02/11/2020 Sol Blazing, MD Requesting Physician: Dr Dwyane Dee  Reason for Consult: Hyponatremia HPI: The patient is a 85 y.o. year-old w/ hx of DJD, gout, BPH, CAD, anemia, HTN, NICM, syncope, postural hypotension who presented on 11/16 w/ falls at home x 7. Hx of orthostatic hypotension. RA 97%.  Na 129 on admit. Rec'd 1.5 L NS boluses.  Na+ down now to 123. Asked to see for hyponatremia.   Pt afeb and normal WBC. Total I/O here are 2.2 L in and 4.1 L out, = -1.8 L.  Wt's not done.   Pt very HOH and answers only the simplest of questions. dtr at bedside.   Lying BP 140/66, HR 66 Sitting 1 minute BP 68/44, HR 68   Sitting 2 minutes  BP 66/ 42, HR 68 > pt denied lightheadedness but was looking dizzy so the test was aborted and pt laid back down in the bed     Meds given here :     Salt tabs 1 g tid      NS bolus 1.5 liter total     Mg so4 4 gm     BP's are high 150- 180/ 84- 95  Past Medical History  Past Medical History:  Diagnosis Date  . Allergic rhinitis   . Arthritis    Gout  . BPH (benign prostatic hypertrophy)   . Bradycardia   . CAD (coronary artery disease)    nonobstructive  . Chronic anemia 2013   h/o IDA, did not tolerate oral iron, latest normal iron studies, B12, folate  . Diverticulosis of colon    conoscopy 12/1993 (Dr. Amedeo Plenty) S/G divertics//colonoscopy L&R divertics (Dr. Amedeo Plenty) 11/08/2004  . Dizziness   . Gout   . HTN (hypertension)   . Memory loss   . Mitral regurgitation    Pulmonic regurgitation, with marked LA enlargement  . Nonischemic cardiomyopathy (HCC)    EF 40%  . Orthostatic hypotension   . PVC (premature ventricular contraction)   . PVD (peripheral vascular disease) ([place])    followed by Dr Trula Slade, treating medically (pletal and aspirin)  . RBBB longstanding  . Squamous cell carcinoma in situ of skin 2015   L ear  . Syncope    Past Surgical History  Past  Surgical History:  Procedure Laterality Date  . 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 APPENDECTOMY    . dexa  2004   "shrunk 2 inches", WNL   Family History  Family History  Problem Relation Age of Onset  . Heart disease Mother 35       Natural causes with CHF  . Hypertension Mother   . Hypertension Father   . Stroke Father        cerebral hemorrhage  . Hyperlipidemia Brother   . Heart disease Brother        before age 63  . Hypertension Brother   . Heart attack Brother   . Hypertension Other    Social History  reports that he quit smoking about 37 years ago. His smoking use included cigarettes. He smoked 2.00 packs per day. He has quit using smokeless tobacco. He reports current alcohol use of about 7.0 standard drinks of alcohol per week. He reports that he does not use drugs. Allergies  Allergies  Allergen Reactions  . Atorvastatin Other (See Comments)    Memory trouble, confusion, fatigue  . Pletal [Cilostazol]    Home medications  Prior to Admission medications   Medication Sig Start Date End Date Taking? Authorizing Provider  atomoxetine (STRATTERA) 10 MG capsule Take 2 capsules (20 mg total) by mouth 2 (two) times daily. 02/02/20  Yes Deboraha Sprang, MD  cholecalciferol (VITAMIN D) 1000 units tablet Take 1,000 Units by mouth daily.   Yes [provider]  dipyridamole-aspirin (AGGRENOX) 200-25 MG 12hr capsule TAKE 1 CAPSULE BY MOUTH TWICE A DAY Patient taking differently: Take 1 capsule by mouth 2 (two) times daily.  10/13/19  Yes Ria Bush, MD  pravastatin (PRAVACHOL) 20 MG tablet TAKE 1 TABLET(20 MG) BY MOUTH DAILY Patient taking differently: Take 20 mg by mouth daily.  11/15/19  Yes Ria Bush, MD  vitamin B-12 (CYANOCOBALAMIN) 500 MCG tablet Take 1 tablet (500 mcg total) by mouth every Monday, Wednesday, and Friday. 01/11/20  Yes Ria Bush, MD     Vitals:   02/11/20 0435 02/11/20 0435 02/11/20 0739 02/11/20 1121  BP: (!)  180/95 (!) 180/95 (!) 165/84 (!) 159/91  Pulse: 66 66 68 65  Resp: 18 20 20 20   Temp: 97.7 F (36.5 C) 97.7 F (36.5 C) 98.4 F (36.9 C) 98.1 F (36.7 C)  TempSrc: Axillary Oral Oral Oral  SpO2:    100%   Exam Gen elderly thin WM in posey vest, very HOH No rash, cyanosis or gangrene Sclera anicteric, throat clear and slightly dry  No jvd or bruits, flat neck veins Chest clear bilat to bases RRR no MRG Abd soft ntnd no mass or ascites +bs GU normal male MS no joint effusions or deformity Ext no leg or UE edema, no wounds or ulcers Neuro is lethargic, arouses to voice and falls asleep Is able to sit up on the side of bed w/ minimal assist    Home meds:  - strattera 20 bid  - aggrenox bid/ pravachol 20 qd  - prn's/ vitamins/ supplements      UA negative    UNa 85, UOsm 665     Na 123  CO2 24  BUN 23  Cr 1.09         CXR 11/16 - negative             ECHO 11.17 - LVEF 35-40%  Assessment/ Plan: 1. Orthostatic hypotension - thyroid and adrenal testing is negative. Looks dry on exam. Tested very positive w/ lying BP 140/68 and sitting BP x 2 of 68 / 44 w/ no increase in heart rate.  This is a chronic condition apparently.  Orthostatic hypotension is part of several neurologic conditions primarily Parkinson's, but also multisystem atrophy and other dementias. Also amyloidosis is another entity to consider.  Seen by cardiology and started on salt tabs.  Hoping to avoid fludrocortisone due to hx syst CHF EF 35-40%.  Currently looks dry to euvolemic. Getting NS boluses. Would recommend fluid restriction 1000 cc and ^salt tabs to 2 gm tid for hyponatremia. Will follow.  2. HOH 3. Gout 4. HL 5. Memory issues      [name]  MD 02/11/2020, 3:01 PM  Recent Labs  Lab 02/08/20 0245 02/08/20 0245 02/09/20 0210 02/10/20 0139  WBC 5.2  --  5.6  --   HGB 10.0*   < > 10.1* 10.7*   < > = values in this interval not displayed.   Recent Labs  Lab 02/10/20 0139 02/11/20 0128   K 3.9 4.3  BUN 23 23  CREATININE 1.12 1.09  CALCIUM 8.3* 8.3*  PHOS 2.8 2.7

## 2020-02-12 ENCOUNTER — Other Ambulatory Visit: Payer: Self-pay

## 2020-02-12 DIAGNOSIS — R55 Syncope and collapse: Secondary | ICD-10-CM | POA: Diagnosis not present

## 2020-02-12 LAB — BASIC METABOLIC PANEL
Anion gap: 6 (ref 5–15)
BUN: 22 mg/dL (ref 8–23)
CO2: 22 mmol/L (ref 22–32)
Calcium: 8.2 mg/dL — ABNORMAL LOW (ref 8.9–10.3)
Chloride: 97 mmol/L — ABNORMAL LOW (ref 98–111)
Creatinine, Ser: 1.15 mg/dL (ref 0.61–1.24)
GFR, Estimated: >60 mL/min (ref >60)
Glucose, Bld: 92 mg/dL (ref 70–99)
Potassium: 4.4 mmol/L (ref 3.5–5.1)
Sodium: 125 mmol/L — ABNORMAL LOW (ref 135–145)

## 2020-02-12 LAB — PHOSPHORUS: Phosphorus: 2.6 mg/dL (ref 2.5–4.6)

## 2020-02-12 LAB — MAGNESIUM: Magnesium: 1.9 mg/dL (ref 1.7–2.4)

## 2020-02-12 LAB — HEMOGLOBIN AND HEMATOCRIT, BLOOD
HCT: 30.1 % — ABNORMAL LOW (ref 39.0–52.0)
Hemoglobin: 10.7 g/dL — ABNORMAL LOW (ref 13.0–17.0)

## 2020-02-12 NOTE — Progress Notes (Addendum)
Pt daughter at bedside concerned for unable to locate pt other hearing aid. Pt currently wearing hearing aid in left ear. Explained to daughter this rn noted one hearing aid located on bedside table this morning and addressed with him if he has aids for one or both ears and pt stated it was for the left ear. This rn asked pt daughter if pt belongings have been searched for device and pt daughter states yes. Pt daughter phones pt other daughter who provides info that hearing aid is in one of the many pt personal belonging bags. Additional hearing aid not  shown to this rn.

## 2020-02-12 NOTE — Progress Notes (Signed)
[facility] Progress Note  Subjective: no c/o today. PT and OT recommending SNF placement.   Vitals:   02/11/20 1543 02/11/20 2318 02/12/20 0557 02/12/20 1222  BP: 91/60 135/76 (!) 152/99 122/79  Pulse:  79 70 72  Resp:  16 16 18   Temp:  98.4 F (36.9 C) 98.5 F (36.9 C) 98.7 F (37.1 C)  TempSrc:  Oral Oral Oral  SpO2: 98% 96% 97% 98%    Exam: Gen elderly thin WM in posey vest, very HOH No rash, cyanosis or gangrene Sclera anicteric, throat clear and slightly dry  No jvd or bruits, flat neck veins Chest clear bilat to bases RRR no MRG Abd soft ntnd no mass or ascites +bs GU normal male MS no joint effusions or deformity Ext no leg or UE edema, no wounds or ulcers Neuro is lethargic, arouses to voice and falls asleep Is able to sit up on the side of bed w/ minimal assist    Home meds:  - strattera 20 bid  - aggrenox bid/ pravachol 20 qd  - prn's/ vitamins/ supplements      UA negative    UNa 85, UOsm 665     Na 123  CO2 24  BUN 23  Cr 1.09         CXR 11/16 - negative             ECHO 11.17 - LVEF 35-40%  Assessment/ Plan: 1. Orthostatic hypotension - thyroid and adrenal testing wnl. Looks dry on exam or euvolemic, not vol overloaded.  Sig orthostatic BP drop w/o HR increase in sitting position yest 11/20.  UNa is high at 80 and UOsm high at 650. This is a chronic condition apparently, would cont abd binder/ compression stockings as in the past.  Orthostatic hypotension is part of several neurologic conditions primarily Parkinson's, also multisystem atrophy and other dementias. Amyloidosis is another entity to consider. Patient also has chronic syst CHF w/ EF 97-94% which complicates this picture. Seen by cardiology who recommended avoiding fluorinef for now and trying salt tabs. We saw him yesterday and ^'d salt tabs to 2 gm tid and started fluid restriction to 1000 cc/day. Na+ up today 125. Would continue this regimen for now. Will follow.   2. HOH 3. Gout 4. HL 5. Memory issues - off and on, not severe per family       [name] 02/12/2020, 5:49 PM   Recent Labs  Lab 02/10/20 0139 02/10/20 0139 02/11/20 0128 02/12/20 0547  K 3.9   < > 4.3 4.4  BUN 23   < > 23 22  CREATININE 1.12   < > 1.09 1.15  CALCIUM 8.3*   < > 8.3* 8.2*  PHOS 2.8   < > 2.7 2.6  HGB 10.7*  --   --  10.7*   < > = values in this interval not displayed.   Inpatient medications: . atomoxetine  20 mg Oral TID  . dipyridamole-aspirin  1 capsule Oral BID  . enoxaparin (LOVENOX) injection  40 mg Subcutaneous Q24H  . pravastatin  20 mg Oral q1800  . sodium chloride  2 g Oral TID WC  . vitamin B-12  500 mcg Oral Q M,W,F    acetaminophen **OR** acetaminophen, labetalol

## 2020-02-12 NOTE — Progress Notes (Signed)
PROGRESS NOTE    Adam Clements  UEA:[id number] DOB: 1934-07-18 DOA: 02/07/2020 PCP: Ria Bush, MD   Brief Narrative:  This 85 y.o.malewithhistory of neurogenic orthostatic hypotension, nonischemic cardiomyopathy last EF measured was 35 to 40% with grade 2 diastolic dysfunction, supine hypertension, TIA, chronic anemia, chronic hyponatremia, chronic kidney disease stage III was brought to the ER after patient had at least 2 episodes of syncope at home.  He was recently started on Strattera by his cardiologist for autonomic orthostatic hypotension. Patient is admitted for recurrent syncope.  MRI head unremarkable.    Patient had syncopal episode twice during hospitalization.  Patient has chronic hyponatremia.  Patient is started on sodium tablets as per nephrology recommendation.  Sodium is improving. PT and OT recommended nursing home placement.   Assessment & Plan:   Principal Problem:   Syncope and collapse Active Problems:   NICM (nonischemic cardiomyopathy) (HCC)   Autonomic failure   Near syncope  1. Syncope with history of autonomic orthostatic hypotension : recently started on Strattera.  Given the worsening function, MRI brain was done which was unremarkable.   PT and OT recommended skilled nursing facility.  Continue monitoring in telemetry , Cardiology consulted recommended increasing the dose of Strattera, given worsening orthostatic hypotension.  Statterra dose increased to 3 times daily,  repeat echocardiogram showed EF 35 to 40%. Cardiology recommended continuing Strattera, and outpatient follow-up with neurology.  Patient has another episode 11/19 when he passed out.  Neurology consulted recommended continue current management.  2. Hyponatremia appears to be chronic at this time.    Patient has received fluid bolus.  Follow metabolic panel closely.  Check urine studies.    TSH and cortisol normal. Na 123.  Nephrology consulted, recommended fluid restriction and  increase salt tablets 2 g 3 times daily.  Sodium improving 125 today  3. History of TIA: Continue Aggrenox and statins.  4. Chronic anemia :  being followed by patient's primary care physician.  Hemoglobin appears to be at baseline.  5. Supine hypertension as per patient's cardiology notes was recently started on hydralazine at bedtime.    6. Nonischemic cardiomyopathy: appears compensated.   DVT prophylaxis:  Lovenox Code Status:Full Family Communication: Spoke with wife on the phone Disposition Plan: Status is: Inpatient  Remains inpatient appropriate because:Inpatient level of care appropriate due to severity of illness   Dispo: The patient is from: Home              Anticipated d/c is to: SNF              Anticipated d/c date is: 2 days              Patient currently is not medically stable to d/c.  Consultants:   Cardiology  Procedures:  None Antimicrobials:   Anti-infectives (From admission, onward)   None      Subjective: Patient was seen and examined at bedside.  Overnight events noted.  Patient seemed improved.   He is alert and awake,  oriented x 2. He was having breakfast watching television.  Sodium is improving 125. Objective: Vitals:   02/11/20 1538 02/11/20 1543 02/11/20 2318 02/12/20 0557  BP:  91/60 135/76 (!) 152/99  Pulse:   79 70  Resp:   16 16  Temp:   98.4 F (36.9 C) 98.5 F (36.9 C)  TempSrc:   Oral Oral  SpO2: 96% 98% 96% 97%    Intake/Output Summary (Last 24 hours) at 02/12/2020 1137 Last data filed  at 02/12/2020 0600 Gross per 24 hour  Intake 240 ml  Output 600 ml  Net -360 ml   There were no vitals filed for this visit.  Examination:  General exam: Appears calm and comfortable, not in any acute distress. Respiratory system: Clear to auscultation. Respiratory effort normal. Cardiovascular system: S1 & S2 heard, RRR. No JVD, murmurs, rubs, gallops or clicks. No pedal edema. Gastrointestinal system: Abdomen is nondistended,  soft and nontender. No organomegaly or masses felt. Normal bowel sounds heard. Central nervous system: Alert and oriented x 2. No focal neurological deficits. Extremities:  No edema, No cyanosis, no clubbing Skin: No rashes, lesions or ulcers Psychiatry: Good judgment and assessment.    Data Reviewed: I have personally reviewed following labs and imaging studies  CBC: Recent Labs  Lab 02/07/20 2049 02/08/20 0245 02/09/20 0210 02/10/20 0139 02/12/20 0547  WBC 6.2 5.2 5.6  --   --   NEUTROABS 4.8  --   --   --   --   HGB 10.8* 10.0* 10.1* 10.7* 10.7*  HCT 31.8* 28.9* 29.0* 30.5* 30.1*  MCV 93.8 93.2 90.6  --   --   PLT 209 211 211  --   --    Basic Metabolic Panel: Recent Labs  Lab 02/08/20 0821 02/09/20 0210 02/10/20 0139 02/11/20 0128 02/12/20 0547  NA 128* 126* 125* 123* 125*  K 4.0 4.1 3.9 4.3 4.4  CL 98 97* 96* 93* 97*  CO2 21* 23 24 24 22   GLUCOSE 125* 94 110* 110* 92  BUN 33* 28* 23 23 22   CREATININE 1.33* 1.28* 1.12 1.09 1.15  CALCIUM 8.7* 8.4* 8.3* 8.3* 8.2*  MG  --  1.4* 1.8 1.6* 1.9  PHOS  --  2.5 2.8 2.7 2.6   GFR: CrCl cannot be calculated (Unknown ideal weight.). Liver Function Tests: Recent Labs  Lab 02/07/20 2049  AST 20  ALT 13  ALKPHOS 48  BILITOT 0.6  PROT 5.9*  ALBUMIN 3.2*   No results for input(s): LIPASE, AMYLASE in the last 168 hours. No results for input(s): AMMONIA in the last 168 hours. Coagulation Profile: No results for input(s): INR, PROTIME in the last 168 hours. Cardiac Enzymes: No results for input(s): CKTOTAL, CKMB, CKMBINDEX, TROPONINI in the last 168 hours. BNP (last 3 results) No results for input(s): PROBNP in the last 8760 hours. HbA1C: No results for input(s): HGBA1C in the last 72 hours. CBG: Recent Labs  Lab 02/10/20 1849  GLUCAP 104*   Lipid Profile: No results for input(s): CHOL, HDL, LDLCALC, TRIG, CHOLHDL, LDLDIRECT in the last 72 hours. Thyroid Function Tests: No results for input(s): TSH,  T4TOTAL, FREET4, T3FREE, THYROIDAB in the last 72 hours. Anemia Panel: No results for input(s): VITAMINB12, FOLATE, FERRITIN, TIBC, IRON, RETICCTPCT in the last 72 hours. Sepsis Labs: No results for input(s): PROCALCITON, LATICACIDVEN in the last 168 hours.  Recent Results (from the past 240 hour(s))  Respiratory Panel by RT PCR (Flu A&B, Covid) - Nasopharyngeal Swab     Status: None   Collection Time: 02/07/20  9:15 PM   Specimen: Nasopharyngeal Swab  Result Value Ref Range Status   SARS Coronavirus 2 by RT PCR NEGATIVE NEGATIVE Final    Comment: (NOTE) SARS-CoV-2 target nucleic acids are NOT DETECTED.  The SARS-CoV-2 RNA is generally detectable in upper respiratoy specimens during the acute phase of infection. The lowest concentration of SARS-CoV-2 viral copies this assay can detect is 131 copies/mL. A negative result does not preclude SARS-Cov-2 infection and  should not be used as the sole basis for treatment or other patient management decisions. A negative result may occur with  improper specimen collection/handling, submission of specimen other than nasopharyngeal swab, presence of viral mutation(s) within the areas targeted by this assay, and inadequate number of viral copies (<131 copies/mL). A negative result must be combined with clinical observations, patient history, and epidemiological information. The expected result is Negative.  Fact Sheet for Patients:  PinkCheek.be  Fact Sheet for Healthcare Providers:  GravelBags.it  This test is no t yet approved or cleared by the [place] FDA and  has been authorized for detection and/or diagnosis of SARS-CoV-2 by FDA under an Emergency Use Authorization (EUA). This EUA will remain  in effect (meaning this test can be used) for the duration of the COVID-19 declaration under Section 564(b)(1) of the Act, 21 U.S.C. section 360bbb-3(b)(1), unless the authorization  is terminated or revoked sooner.     Influenza A by PCR NEGATIVE NEGATIVE Final   Influenza B by PCR NEGATIVE NEGATIVE Final    Comment: (NOTE) The Xpert Xpress SARS-CoV-2/FLU/RSV assay is intended as an aid in  the diagnosis of influenza from Nasopharyngeal swab specimens and  should not be used as a sole basis for treatment. Nasal washings and  aspirates are unacceptable for Xpert Xpress SARS-CoV-2/FLU/RSV  testing.  Fact Sheet for Patients: PinkCheek.be  Fact Sheet for Healthcare Providers: GravelBags.it  This test is not yet approved or cleared by the [place] FDA and  has been authorized for detection and/or diagnosis of SARS-CoV-2 by  FDA under an Emergency Use Authorization (EUA). This EUA will remain  in effect (meaning this test can be used) for the duration of the  Covid-19 declaration under Section 564(b)(1) of the Act, 21  U.S.C. section 360bbb-3(b)(1), unless the authorization is  terminated or revoked. Performed at [facility], [place] [address] [place], [place] [address]      Radiology Studies: No results found.  Scheduled Meds: . atomoxetine  20 mg Oral TID  . dipyridamole-aspirin  1 capsule Oral BID  . enoxaparin (LOVENOX) injection  40 mg Subcutaneous Q24H  . pravastatin  20 mg Oral q1800  . sodium chloride  2 g Oral TID WC  . vitamin B-12  500 mcg Oral Q M,W,F   Continuous Infusions:    LOS: 3 days    Time spent: 25 mins    Shawna Clamp, MD [facility]   If 7PM-7AM, please contact night-coverage

## 2020-02-13 DIAGNOSIS — R55 Syncope and collapse: Secondary | ICD-10-CM | POA: Diagnosis not present

## 2020-02-13 LAB — BASIC METABOLIC PANEL
Anion gap: 8 (ref 5–15)
BUN: 27 mg/dL — ABNORMAL HIGH (ref 8–23)
CO2: 24 mmol/L (ref 22–32)
Calcium: 8.3 mg/dL — ABNORMAL LOW (ref 8.9–10.3)
Chloride: 94 mmol/L — ABNORMAL LOW (ref 98–111)
Creatinine, Ser: 1.2 mg/dL (ref 0.61–1.24)
GFR, Estimated: >60 mL/min (ref >60)
Glucose, Bld: 99 mg/dL (ref 70–99)
Potassium: 4.7 mmol/L (ref 3.5–5.1)
Sodium: 126 mmol/L — ABNORMAL LOW (ref 135–145)

## 2020-02-13 MED ORDER — SODIUM CHLORIDE 0.9 % IV SOLN: INTRAVENOUS | Status: AC

## 2020-02-13 MED FILL — Medication: Qty: 1 | Status: AC

## 2020-02-13 NOTE — Progress Notes (Signed)
[facility] Progress Note  Subjective:  noted agitation overnight.  Had 1.2 liters UOP charted over 11/21.  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   Review of systems:  Difficult to obtain 2/2 agitation; he denies overt shortness of breath About to try lunch  Vitals:   02/12/20 1222 02/12/20 1759 02/12/20 2100 02/13/20 0613  BP: 122/79 (!) 164/87 129/70   Pulse: 72 77 75   Resp: 18 19 16  (!) 21  Temp: 98.7 F (37.1 C) 97.9 F (36.6 C) 97.9 F (36.6 C)   TempSrc: Oral Oral Oral   SpO2: 98% 97% 95% 97%    Exam: Gen elderly thin WM; very hard of hearing  NCAT sclera anicteric Chest clear bilat to bases S1S2 no rub Abd soft ntnd  Ext no leg or UE edema  Neuro states it is 2022.  sitting up on the side of bed but tries to get up; staff came and repositioned him to lying down in bed      UA negative    UNa 85, UOsm 665     Na 123  CO2 24  BUN 23  Cr 1.09         CXR 11/16 - negative             ECHO 11.17 - LVEF 35-40%  Assessment/ Plan: 1. Orthostatic hypotension - thyroid and adrenal testing wnl. Admitted with syncope.  Looks dry on exam or euvolemic, not vol overloaded.  Sig orthostatic BP drop w/o HR increase in sitting position on 11/20.  UNa is high at 80 and UOsm high at 650. This is a chronic condition apparently.  Note orthostatic hypotension is part of several neurologic conditions primarily Parkinson's, also multisystem atrophy and other dementias. Amyloidosis is another entity to consider. Seen by cardiology who recommended avoiding fluorinef for now and trying salt tabs.  Note not on diuretics at home 1. Would cont abd binder/ compression stockings as in the past.   2. He has been on salt tabs and is on fluid restriction to 1000 cc/day 3. Continue current regimen 4. NS 75 ml/hr x 4 hours if he will tolerate 2. Agitation - noted. Appreciate staff and primary team efforts 3. Chronic systolic CHF with EF 88-82% -  euvolemic on exam; may be slightly dry  4. Gout per primary team  5. HL per primary team  6. Memory impairment - per charting- off and on, not severe per family   Recent Labs  Lab 02/10/20 0139 02/10/20 0139 02/11/20 0128 02/11/20 0128 02/12/20 0547 02/13/20 0330  K 3.9   < > 4.3   < > 4.4 4.7  BUN 23   < > 23   < > 22 27*  CREATININE 1.12   < > 1.09   < > 1.15 1.20  CALCIUM 8.3*   < > 8.3*   < > 8.2* 8.3*  PHOS 2.8   < > 2.7  --  2.6  --   HGB 10.7*  --   --   --  10.7*  --    < > = values in this interval not displayed.   Inpatient medications: . atomoxetine  20 mg Oral TID  . dipyridamole-aspirin  1 capsule Oral BID  . enoxaparin (LOVENOX) injection  40 mg Subcutaneous Q24H  . pravastatin  20 mg Oral q1800  . sodium chloride  2 g Oral TID WC  . vitamin B-12  500 mcg Oral  Q M,W,F    acetaminophen **OR** acetaminophen, labetalol   Claudia Desanctis, MD 02/13/2020  11:30 AM

## 2020-02-13 NOTE — Progress Notes (Signed)
Physical Therapy Treatment Patient Details Name: Adam Clements MRN: [id number] DOB: November 05, 1934 Today's Date: 02/13/2020    History of Present Illness 85yo male brought to the ED after multiple episodes of syncope during which he has hit his head and lost consiciousness. EKG in NSR with known R BBB, CTH and cervical spine imaging clear of acute injury. PMH neurogenic orthostatic hypotension/autonomic failure, cardiomyopathy, supine HTN, TIA, anemia, CKD, gout, memory loss, PVD    PT Comments    Pt asleep upon arrival, but agreeable to therapy. Pt with drop in BP from supine>sit without becoming symptomatic. Therapist returned pt supine in bed and performed HEP with pt and daughter, wrote HEP on board to perform 3 x a day. Pt and daughter verbalized understanding. Pt left in semi chair position in bed with daughter present to assess change in cognition with change in position. Pt demonstrating deficits in transfers and safety and will benefit from skilled PT to address deficits to maximize independence with functional mobility     Follow Up Recommendations  SNF;Supervision/Assistance - 24 hour     Equipment Recommendations  Other (comment) (defer to next venue)    Recommendations for Other Services       Precautions / Restrictions Restrictions Weight Bearing Restrictions: No    Mobility  Bed Mobility  supine<>sit: S                Transfers                    Ambulation/Gait                 Stairs             Wheelchair Mobility    Modified Rankin (Stroke Patients Only)       Balance                                            Cognition                                              Exercises General Exercises - Lower Extremity Heel Slides: AROM;Strengthening;Both;15 reps;Supine Hip ABduction/ADduction: AROM;Strengthening;Both;15 reps;Supine Straight Leg Raises: AROM;Both;15 reps;Supine Other  Exercises Other Exercises: 15 reps bridges with 3 sec hold    General Comments  increased time spent educating pt and daughter on sitting more upright in the bed to improve sitting tolerance, verbalized understanding      Pertinent Vitals/Pain      Home Living                      Prior Function            PT Goals (current goals can now be found in the care plan section) Acute Rehab PT Goals Patient Stated Goal: I will do whatever you say PT Goal Formulation: Patient unable to participate in goal setting Time For Goal Achievement: 02/23/20 Potential to Achieve Goals: Fair Progress towards PT goals: Progressing toward goals    Frequency    Min 2X/week      PT Plan Current plan remains appropriate    Co-evaluation              AM-PAC PT "6 Clicks" Mobility  Outcome Measure  Help needed turning from your back to your side while in a flat bed without using bedrails?: None Help needed moving from lying on your back to sitting on the side of a flat bed without using bedrails?: None Help needed moving to and from a bed to a chair (including a wheelchair)?: A Lot Help needed standing up from a chair using your arms (e.g., wheelchair or bedside chair)?: A Lot Help needed to walk in hospital room?: Total Help needed climbing 3-5 steps with a railing? : Total 6 Click Score: 14    End of Session   Activity Tolerance: Treatment limited secondary to medical complications (Comment) Patient left: in bed;with call bell/phone within reach;with bed alarm set;with family/visitor present Nurse Communication: Mobility status PT Visit Diagnosis: Unsteadiness on feet (R26.81);Muscle weakness (generalized) (M62.81);Repeated falls (R29.6);Difficulty in walking, not elsewhere classified (R26.2)     Time: 8251-8984 PT Time Calculation (min) (ACUTE ONLY): 23 min  Charges:  $Therapeutic Exercise: 23-37 mins                     Lyanne Co, DPT Acute Rehabilitation  Services [phone]   [place] 02/13/2020, 1:43 PM

## 2020-02-13 NOTE — Progress Notes (Signed)
CSW called [facility] and is informed that Adam Clements has not been received yet. Pt is also not medically stable per attending MD.   CSW spoke with pt daughter [name] and provided update on pt care. [name] requested accomodation to have 2 visitors a day for her dad due to is confusion. CSW explained that accommodations are limited to comfort care pt's. CSW notified nursing director Rod Holler.

## 2020-02-13 NOTE — Progress Notes (Addendum)
Pt placed back in bed. Was able to go back to sleep. Soft belt restraint not placed on patient. However, pt keeps walking up after being reoriented and placed back to sleep. Once up, he will attempt to unsafely get up with no assistance.

## 2020-02-13 NOTE — Progress Notes (Signed)
Pt woke up and continuously attempted to get out of bed and stand. Pt confused. Very unsteady on feet. As soon as patient stood up, he began falling back in bed. Nurse was at bedside. Assisted patient back in bed. Attempted to reorient him. Provider paged and made aware. Nurse requested order for non violent soft belt restraint to prevent patient from standing completely up and having his blood pressure drop as previously. Pt remains having orthostatic hypotension.

## 2020-02-13 NOTE — Progress Notes (Signed)
PROGRESS NOTE    Adam Clements  LOV:[id number] DOB: April 19, 1934 DOA: 02/07/2020 PCP: Ria Bush, MD   Brief Narrative:  This 85 y.o.malewithhistory of neurogenic orthostatic hypotension, nonischemic cardiomyopathy last EF measured was 35 to 40% with grade 2 diastolic dysfunction, supine hypertension, TIA, chronic anemia, chronic hyponatremia, chronic kidney disease stage III was brought to the ER after patient had at least 2 episodes of syncope at home.  He was recently started on Strattera by his cardiologist for autonomic orthostatic hypotension. Patient is admitted for recurrent syncope.  MRI head unremarkable.    Patient had syncopal episode twice during hospitalization.  Patient has chronic hyponatremia.  Patient is started on sodium tablets as per nephrology recommendation.  Sodium is improving. PT and OT recommended nursing home placement.   Assessment & Plan:   Principal Problem:   Syncope and collapse Active Problems:   NICM (nonischemic cardiomyopathy) (HCC)   Autonomic failure   Near syncope  1. Syncope with history of autonomic orthostatic hypotension : recently started on Strattera.  Given the worsening function, MRI brain was done which was unremarkable.   PT and OT recommended skilled nursing facility.  Continue monitoring in telemetry , Cardiology consulted recommended increasing the dose of Strattera, given worsening orthostatic hypotension.  Statterra dose increased to 3 times daily,  repeat echocardiogram showed EF 35 to 40%. Cardiology recommended continuing Strattera, and outpatient follow-up with neurology.  Patient has another episode 11/19 when he passed out.  Neurology consulted recommended continue current management.  2. Hyponatremia appears to be chronic at this time.    Patient has received fluid bolus.  Follow metabolic panel closely.  Check urine studies.    TSH and cortisol normal. Na 123.  Nephrology consulted, recommended fluid restriction and  increase salt tablets 2 g 3 times daily.  Sodium improving 126 today  3. History of TIA: Continue Aggrenox and statins.  4. Chronic anemia :  being followed by patient's primary care physician.  Hemoglobin appears to be at baseline.  5. Supine hypertension as per patient's cardiology notes was recently started on hydralazine at bedtime.    6. Nonischemic cardiomyopathy: appears compensated.   DVT prophylaxis:  Lovenox Code Status:Full Family Communication: Spoke with wife on the phone Disposition Plan: Status is: Inpatient  Remains inpatient appropriate because:Inpatient level of care appropriate due to severity of illness   Dispo: The patient is from: Home              Anticipated d/c is to: SNF              Anticipated d/c date is: 2 days              Patient currently is not medically stable to d/c.  Consultants:   Cardiology  Procedures:  None Antimicrobials:   Anti-infectives (From admission, onward)   None      Subjective: Patient was seen and examined at bedside.  Overnight events noted.   Patient was restless and agitated last night.   He wants to come out of the bed.  He is in restraints. Sodium is improving 126. Objective: Vitals:   02/12/20 2100 02/13/20 0613 02/13/20 1228 02/13/20 1340  BP: 129/70  (!) 183/84   Pulse: 75  67   Resp: 16 (!) 21 18   Temp: 97.9 F (36.6 C)  98.4 F (36.9 C)   TempSrc: Oral  Oral   SpO2: 95% 97% 97% 100%    Intake/Output Summary (Last 24 hours) at 02/13/2020 1415  Last data filed at 02/12/2020 1637 Gross per 24 hour  Intake --  Output 1200 ml  Net -1200 ml   There were no vitals filed for this visit.  Examination:  General exam: Appears calm and comfortable, not in any acute distress. Respiratory system: Clear to auscultation. Respiratory effort normal. Cardiovascular system: S1 & S2 heard, RRR. No JVD, murmurs, rubs, gallops or clicks. No pedal edema. Gastrointestinal system: Abdomen is nondistended, soft  and nontender. No organomegaly or masses felt. Normal bowel sounds heard. Central nervous system: Alert and oriented x 1.  No focal neurological deficits. Extremities:  No edema, No cyanosis, no clubbing Skin: No rashes, lesions or ulcers Psychiatry: Good judgment and assessment.    Data Reviewed: I have personally reviewed following labs and imaging studies  CBC: Recent Labs  Lab 02/07/20 2049 02/08/20 0245 02/09/20 0210 02/10/20 0139 02/12/20 0547  WBC 6.2 5.2 5.6  --   --   NEUTROABS 4.8  --   --   --   --   HGB 10.8* 10.0* 10.1* 10.7* 10.7*  HCT 31.8* 28.9* 29.0* 30.5* 30.1*  MCV 93.8 93.2 90.6  --   --   PLT 209 211 211  --   --    Basic Metabolic Panel: Recent Labs  Lab 02/09/20 0210 02/10/20 0139 02/11/20 0128 02/12/20 0547 02/13/20 0330  NA 126* 125* 123* 125* 126*  K 4.1 3.9 4.3 4.4 4.7  CL 97* 96* 93* 97* 94*  CO2 23 24 24 22 24   GLUCOSE 94 110* 110* 92 99  BUN 28* 23 23 22  27*  CREATININE 1.28* 1.12 1.09 1.15 1.20  CALCIUM 8.4* 8.3* 8.3* 8.2* 8.3*  MG 1.4* 1.8 1.6* 1.9  --   PHOS 2.5 2.8 2.7 2.6  --    GFR: CrCl cannot be calculated (Unknown ideal weight.). Liver Function Tests: Recent Labs  Lab 02/07/20 2049  AST 20  ALT 13  ALKPHOS 48  BILITOT 0.6  PROT 5.9*  ALBUMIN 3.2*   No results for input(s): LIPASE, AMYLASE in the last 168 hours. No results for input(s): AMMONIA in the last 168 hours. Coagulation Profile: No results for input(s): INR, PROTIME in the last 168 hours. Cardiac Enzymes: No results for input(s): CKTOTAL, CKMB, CKMBINDEX, TROPONINI in the last 168 hours. BNP (last 3 results) No results for input(s): PROBNP in the last 8760 hours. HbA1C: No results for input(s): HGBA1C in the last 72 hours. CBG: Recent Labs  Lab 02/10/20 1849  GLUCAP 104*   Lipid Profile: No results for input(s): CHOL, HDL, LDLCALC, TRIG, CHOLHDL, LDLDIRECT in the last 72 hours. Thyroid Function Tests: No results for input(s): TSH, T4TOTAL,  FREET4, T3FREE, THYROIDAB in the last 72 hours. Anemia Panel: No results for input(s): VITAMINB12, FOLATE, FERRITIN, TIBC, IRON, RETICCTPCT in the last 72 hours. Sepsis Labs: No results for input(s): PROCALCITON, LATICACIDVEN in the last 168 hours.  Recent Results (from the past 240 hour(s))  Respiratory Panel by RT PCR (Flu A&B, Covid) - Nasopharyngeal Swab     Status: None   Collection Time: 02/07/20  9:15 PM   Specimen: Nasopharyngeal Swab  Result Value Ref Range Status   SARS Coronavirus 2 by RT PCR NEGATIVE NEGATIVE Final    Comment: (NOTE) SARS-CoV-2 target nucleic acids are NOT DETECTED.  The SARS-CoV-2 RNA is generally detectable in upper respiratoy specimens during the acute phase of infection. The lowest concentration of SARS-CoV-2 viral copies this assay can detect is 131 copies/mL. A negative result does not preclude  SARS-Cov-2 infection and should not be used as the sole basis for treatment or other patient management decisions. A negative result may occur with  improper specimen collection/handling, submission of specimen other than nasopharyngeal swab, presence of viral mutation(s) within the areas targeted by this assay, and inadequate number of viral copies (<131 copies/mL). A negative result must be combined with clinical observations, patient history, and epidemiological information. The expected result is Negative.  Fact Sheet for Patients:  PinkCheek.be  Fact Sheet for Healthcare Providers:  GravelBags.it  This test is no t yet approved or cleared by the [place] FDA and  has been authorized for detection and/or diagnosis of SARS-CoV-2 by FDA under an Emergency Use Authorization (EUA). This EUA will remain  in effect (meaning this test can be used) for the duration of the COVID-19 declaration under Section 564(b)(1) of the Act, 21 U.S.C. section 360bbb-3(b)(1), unless the authorization is  terminated or revoked sooner.     Influenza A by PCR NEGATIVE NEGATIVE Final   Influenza B by PCR NEGATIVE NEGATIVE Final    Comment: (NOTE) The Xpert Xpress SARS-CoV-2/FLU/RSV assay is intended as an aid in  the diagnosis of influenza from Nasopharyngeal swab specimens and  should not be used as a sole basis for treatment. Nasal washings and  aspirates are unacceptable for Xpert Xpress SARS-CoV-2/FLU/RSV  testing.  Fact Sheet for Patients: PinkCheek.be  Fact Sheet for Healthcare Providers: GravelBags.it  This test is not yet approved or cleared by the [place] FDA and  has been authorized for detection and/or diagnosis of SARS-CoV-2 by  FDA under an Emergency Use Authorization (EUA). This EUA will remain  in effect (meaning this test can be used) for the duration of the  Covid-19 declaration under Section 564(b)(1) of the Act, 21  U.S.C. section 360bbb-3(b)(1), unless the authorization is  terminated or revoked. Performed at [facility], [place] [address] [place], [place] [address]      Radiology Studies: No results found.  Scheduled Meds: . atomoxetine  20 mg Oral TID  . dipyridamole-aspirin  1 capsule Oral BID  . enoxaparin (LOVENOX) injection  40 mg Subcutaneous Q24H  . pravastatin  20 mg Oral q1800  . sodium chloride  2 g Oral TID WC  . vitamin B-12  500 mcg Oral Q M,W,F   Continuous Infusions: . sodium chloride 75 mL/hr at 02/13/20 1211     LOS: 4 days    Time spent: 25 mins    Damone Fancher, MD [facility]   If 7PM-7AM, please contact night-coverage

## 2020-02-13 NOTE — Progress Notes (Signed)
Pt became physically aggressive. Pt unbuckled one strap of restraint and then slid down the bed, tangling the soft belt restraint over his upper chest. Due to patient continuously tampering with restraint, the restraint was taken off/discontinued to prevent injury. Provider to be called for different type of restraint of patient continues to aggressively get out of bed given his postural hypotension.  Pt currently distracted with eating breakfast. Bed alarm placed, pt on low bed.

## 2020-02-14 ENCOUNTER — Ambulatory Visit: Payer: Medicare HMO | Admitting: Student

## 2020-02-14 DIAGNOSIS — R55 Syncope and collapse: Secondary | ICD-10-CM | POA: Diagnosis not present

## 2020-02-14 LAB — BASIC METABOLIC PANEL
Anion gap: 6 (ref 5–15)
BUN: 24 mg/dL — ABNORMAL HIGH (ref 8–23)
CO2: 25 mmol/L (ref 22–32)
Calcium: 8.6 mg/dL — ABNORMAL LOW (ref 8.9–10.3)
Chloride: 94 mmol/L — ABNORMAL LOW (ref 98–111)
Creatinine, Ser: 1.08 mg/dL (ref 0.61–1.24)
GFR, Estimated: >60 mL/min (ref >60)
Glucose, Bld: 91 mg/dL (ref 70–99)
Potassium: 4.6 mmol/L (ref 3.5–5.1)
Sodium: 125 mmol/L — ABNORMAL LOW (ref 135–145)

## 2020-02-14 LAB — MAGNESIUM: Magnesium: 1.6 mg/dL — ABNORMAL LOW (ref 1.7–2.4)

## 2020-02-14 LAB — PHOSPHORUS: Phosphorus: 2.9 mg/dL (ref 2.5–4.6)

## 2020-02-14 MED ORDER — MAGNESIUM SULFATE 2 GM/50ML IV SOLN
2.0000 g | Freq: Once | INTRAVENOUS | Status: AC
  Administered 2020-02-14: 2 g via INTRAVENOUS
  Filled 2020-02-14: qty 50

## 2020-02-14 NOTE — Progress Notes (Signed)
[facility] Progress Note  Subjective:  Strict ins/outs not available.  Was agitated yesterday and needed frequent redirection.  His daughter has been here and this has helped.  Has been much better today per nursing but per his daughter he did try to get out of bed this AM.  His daughter doesn't know how long he's been on strattera   Review of systems:  he denies shortness of breath Less agitated per nursing  Feels ok   Vitals:   02/13/20 1340 02/13/20 1818 02/13/20 2342 02/14/20 0644  BP:  (!) 177/116 (!) 171/93 (!) 162/93  Pulse:  70 70 73  Resp:  16 16 19   Temp:  98.4 F (36.9 C) 97.7 F (36.5 C) 97.7 F (36.5 C)  TempSrc:  Oral Axillary Oral  SpO2: 100% 99% 96% 98%    Exam: Gen elderly thin WM; very hard of hearing  NCAT sclera anicteric Chest clear bilat to bases S1S2 no rub Abd soft ntnd  Ext no leg or UE edema  Neuro states it is 2022.  sitting up on the side of bed but tries to get up; staff came and repositioned him to lying down in bed      UA negative    UNa 85, UOsm 665     Na 123  CO2 24  BUN 23  Cr 1.09         CXR 11/16 - negative             ECHO 11.17 - LVEF 35-40%  Assessment/ Plan: 1. Hyponatremia thyroid and adrenal testing wnl. Looks dry on exam or euvolemic, not vol overloaded. UNa is high at 80 and UOsm high at 650.  Hyponatremia per labs in the [facility] this year.   1. He has been on salt tabs and is on fluid restriction to 1000 cc/day.  Got NS 11/22 limited duration as he seemed slightly dry  2. Continue current regimen for now 2. Orthostatic hypotension - admitted with syncope.  Sig orthostatic BP drop w/o HR increase in sitting position on 11/20.  This is a chronic condition apparently.  Note orthostatic hypotension is part of several neurologic conditions primarily Parkinson's, also multisystem atrophy and other dementias. Amyloidosis is another entity to consider. Seen by cardiology who recommended avoiding fluorinef for  now and trying salt tabs.  Note not on diuretics at home 1. Would cont abd binder/ compression stockings as in the past.    3. Agitation - noted. Appreciate staff and primary team efforts 4. Chronic systolic CHF with EF 57-01% - euvolemic on exam; ordered limited duration IVF on 11/22 5. Gout per primary team  6. HL per primary team  7. Memory impairment - per charting- off and on, not severe per family   Recent Labs  Lab 02/10/20 0139 02/11/20 0128 02/12/20 0547 02/12/20 0547 02/13/20 0330 02/14/20 0234  K 3.9   < > 4.4   < > 4.7 4.6  BUN 23   < > 22   < > 27* 24*  CREATININE 1.12   < > 1.15   < > 1.20 1.08  CALCIUM 8.3*   < > 8.2*   < > 8.3* 8.6*  PHOS 2.8   < > 2.6  --   --  2.9  HGB 10.7*  --  10.7*  --   --   --    < > = values in this interval not displayed.   Inpatient medications: . atomoxetine  20 mg  Oral TID  . dipyridamole-aspirin  1 capsule Oral BID  . enoxaparin (LOVENOX) injection  40 mg Subcutaneous Q24H  . pravastatin  20 mg Oral q1800  . sodium chloride  2 g Oral TID WC  . vitamin B-12  500 mcg Oral Q M,W,F    acetaminophen **OR** acetaminophen, labetalol   Claudia Desanctis, MD 02/14/2020  11:23 AM

## 2020-02-14 NOTE — Care Management Important Message (Signed)
Important Message  Patient Details  Name: Adam Clements MRN: [id number] Date of Birth: [birth year]   Medicare Important Message Given:  Yes     [name] P [place] 02/14/2020, 10:26 AM

## 2020-02-14 NOTE — Progress Notes (Signed)
Occupational Therapy Treatment Patient Details Name: Adam Clements MRN: [id number] DOB: 08-04-1934 Today's Date: 02/14/2020    History of present illness 85yo male brought to the ED after multiple episodes of syncope during which he has hit his head and lost consiciousness. EKG in NSR with known R BBB, CTH and cervical spine imaging clear of acute injury. PMH neurogenic orthostatic hypotension/autonomic failure, cardiomyopathy, supine HTN, TIA, anemia, CKD, gout, memory loss, PVD   OT comments  Pt seen in conjunction with PT to maximize pts activity tolerance and for pt safety as pt presents with orthostatic hypotension. Per daughter pt reports that pt wore thigh high TEDs at baseline. Donned TEDS from bed level with MOD A with BP greatly improving. Pt currently presents with decreased activity tolerance, impaired cognition and impaired balance impacting pts ability to complete BADLs although pt making good improvements today ( see vitals below). Pt able to progress OOB to recliner with MIN A +2 needing assist for posterior lean. BP much more stable with TEDS donned. Pt could progress to [facility] pending continued stable BP and progression with therapies. Will continue to follow acutely per POC.   Supine- 80/57 EOB- 75/53 back to supine- 73/52 In recliner with TEDS 112/67 In recliner after being in recliner ~ 8 mins 131/76    Follow Up Recommendations  SNF;Supervision/Assistance - 24 hour    Equipment Recommendations  Other (comment) (TBD)    Recommendations for Other Services      Precautions / Restrictions Precautions Precautions: Fall;Other (comment) Precaution Comments: autonomic failure/neurogenic orthostatic hypotension- has gone syncopal in less than 20 seconds in standing with nursing, impulsive Restrictions Weight Bearing Restrictions: No       Mobility Bed Mobility Overal bed mobility: Needs Assistance Bed Mobility: Supine to Sit     Supine to sit: Supervision;HOB  elevated     General bed mobility comments: close supervision for safety  Transfers Overall transfer level: Needs assistance Equipment used: 2 person hand held assist Transfers: Sit to/from Stand;Stand Pivot Transfers Sit to Stand: Min assist;+2 safety/equipment Stand pivot transfers: Min assist;+2 physical assistance       General transfer comment: pt sit<>stand from EOB with MIN A +2 with heavy posterior lean, MIN A +2 for pivot transfer needing cues to for safety and hand placment and safety when descending onto sitting surface as pt prematurely attempting to sit into chair    Balance Overall balance assessment: History of Falls;Needs assistance Sitting-balance support: No upper extremity supported;Feet supported Sitting balance-Leahy Scale: Fair     Standing balance support: Bilateral upper extremity supported;During functional activity Standing balance-Leahy Scale: Poor Standing balance comment: reliant on external assist and BUE support                           ADL either performed or assessed with clinical judgement   ADL Overall ADL's : Needs assistance/impaired                     Lower Body Dressing: Moderate assistance;Sitting/lateral leans Lower Body Dressing Details (indicate cue type and reason): pt assisting with donning TEDS from long sitting in bed Toilet Transfer: Minimal assistance;+2 for safety/equipment;Ambulation Toilet Transfer Details (indicate cue type and reason): simulated via functional mobility         Functional mobility during ADLs: Minimal assistance;Cueing for safety;+2 for safety/equipment General ADL Comments: orthostasis improved this session with use of TEDs- ABD binder also ordered     Vision  Perception     Praxis      Cognition Arousal/Alertness: Awake/alert;Lethargic (moments of lethargy but more alert once OOB in recliner) Behavior During Therapy: Flat affect;Impulsive Overall Cognitive Status:  Impaired/Different from baseline Area of Impairment: Attention;Awareness                         Safety/Judgement: Decreased awareness of safety;Decreased awareness of deficits Awareness: Emergent Problem Solving: Slow processing General Comments: pt lethargic upon arrival but does arouse more as session progresses. pt with moments of impulsivity but able to follow commands. more insight noted in relation to [facility] in comparison to previous session as pt does endorse feeling dizzy. pt also HOH which likely contributes to slow processing        Exercises     Shoulder Instructions       General Comments pts daughter present during session, very supportive and helpful. supine- 80/57, EOB- 75/53, back to supine- 73/52, in recliner with TEDS 112/67, in recliner after being in recliner ~ 8 mins 131/76, recliner with legs down- 109/68    Pertinent Vitals/ Pain       Pain Assessment: Faces Faces Pain Scale: No hurt  Home Living                                          Prior Functioning/Environment              Frequency  Min 2X/week        Progress Toward Goals  OT Goals(current goals can now be found in the care plan section)  Progress towards OT goals: Progressing toward goals  Acute Rehab OT Goals Patient Stated Goal: I will do whatever you say Time For Goal Achievement: 02/23/20 Potential to Achieve Goals: [place] Discharge plan remains appropriate;Frequency remains appropriate    Co-evaluation      Reason for Co-Treatment: Complexity of the patient's impairments (multi-system involvement);For patient/therapist safety PT goals addressed during session: Mobility/safety with mobility;Strengthening/ROM;Balance OT goals addressed during session: ADL's and self-care      AM-PAC OT "6 Clicks" Daily Activity     Outcome Measure   Help from another person eating meals?: A Little Help from another person taking care of personal grooming?:  A Little Help from another person toileting, which includes using toliet, bedpan, or urinal?: A Lot Help from another person bathing (including washing, rinsing, drying)?: A Lot Help from another person to put on and taking off regular upper body clothing?: A Little Help from another person to put on and taking off regular lower body clothing?: A Little 6 Click Score: 16    End of Session    OT Visit Diagnosis: Other abnormalities of gait and mobility (R26.89);Muscle weakness (generalized) (M62.81);History of falling (Z91.81);Other symptoms and signs involving cognitive function;Dizziness and giddiness (R42)   Activity Tolerance Patient tolerated treatment well   Patient Left in chair;with call bell/phone within reach;with chair alarm set   Nurse Communication Mobility status;Other (comment) (continue to monitor BP, dtr wants to take pt outside later if BP is stable)        Time: 8250-5397 OT Time Calculation (min): 46 min  Charges: OT General Charges $OT Visit: 1 Visit OT Treatments $Therapeutic Activity: 8-22 mins  Lanier Clam., COTA/L Acute Rehabilitation Services [phone] [place] 02/14/2020, 3:13 PM

## 2020-02-14 NOTE — Progress Notes (Signed)
PROGRESS NOTE    Adam Clements  MGQ:[id number] DOB: [birth year] DOA: 02/07/2020 PCP: Ria Bush, MD   Brief Narrative:  This 85 y.o.malewithhistory of neurogenic orthostatic hypotension, nonischemic cardiomyopathy last EF measured was 35 to 40% with grade 2 diastolic dysfunction, supine hypertension, TIA, chronic anemia, chronic hyponatremia, chronic kidney disease stage III was brought to the ER after patient had at least 2 episodes of syncope at home.  He was recently started on Strattera by his cardiologist for autonomic orthostatic hypotension. Patient is admitted for recurrent syncope.  MRI head unremarkable.    Patient had syncopal episode twice during hospitalization.  Patient has chronic hyponatremia.  Patient is started on sodium tablets as per nephrology recommendation.  Sodium is improving. PT and OT recommended nursing home placement.   Assessment & Plan:   Principal Problem:   Syncope and collapse Active Problems:   NICM (nonischemic cardiomyopathy) (HCC)   Autonomic failure   Near syncope  1. Syncope with history of autonomic orthostatic hypotension : recently started on Strattera.  Given the worsening function, MRI brain was done which was unremarkable.   PT and OT recommended skilled nursing facility.  Continue monitoring in telemetry , Cardiology consulted recommended increasing the dose of Strattera, given worsening orthostatic hypotension.  Statterra dose increased to 3 times daily,  repeat echocardiogram showed EF 35 to 40%. Cardiology recommended continuing Strattera, and outpatient follow-up with neurology.  Patient has another episode 11/19 when he passed out.  Neurology consulted recommended continue current management.  Orthostatic hypotension has improved with abdominal binders and TED hose.  2. Hyponatremia appears to be chronic at this time.    Patient has received fluid bolus.  Follow metabolic panel closely.   TSH and cortisol normal. Na 123.  Nephrology  consulted, recommended fluid restriction and increase salt tablets 2 g 3 times daily.  Sodium improving 125 today  3. History of TIA: Continue Aggrenox and statins.  4. Chronic anemia :  being followed by patient's primary care physician.  Hemoglobin appears to be at baseline.  5. Supine hypertension as per patient's cardiology notes was recently started on hydralazine at bedtime.    6. Nonischemic cardiomyopathy: appears compensated.   DVT prophylaxis:  Lovenox Code Status:Full Family Communication: Spoke with wife on the phone Disposition Plan: Status is: Inpatient  Remains inpatient appropriate because:Inpatient level of care appropriate due to severity of illness   Dispo: The patient is from: Home              Anticipated d/c is to: SNF              Anticipated d/c date is: 2 days              Patient currently is not medically stable to d/c.  Consultants:   Cardiology  Procedures:  None Antimicrobials:   Anti-infectives (From admission, onward)   None      Subjective: Patient was seen and examined at bedside.  Overnight events noted.   Patient appears much improved, alert and oriented sitting on the bed watching television.  Daughter was at bedside Sodium is improving 125. Objective: Vitals:   02/13/20 1818 02/13/20 2342 02/14/20 0644 02/14/20 1243  BP: (!) 177/116 (!) 171/93 (!) 162/93 106/68  Pulse: 70 70 73 64  Resp: 16 16 19 17   Temp: 98.4 F (36.9 C) 97.7 F (36.5 C) 97.7 F (36.5 C) 97.8 F (36.6 C)  TempSrc: Oral Axillary Oral Axillary  SpO2: 99% 96% 98% 98%  Intake/Output Summary (Last 24 hours) at 02/14/2020 1539 Last data filed at 02/14/2020 1215 Gross per 24 hour  Intake 1070.1 ml  Output 700 ml  Net 370.1 ml   There were no vitals filed for this visit.  Examination:  General exam: Appears calm and comfortable, not in any acute distress. Respiratory system: Clear to auscultation. Respiratory effort normal. Cardiovascular system:  S1 & S2 heard, RRR. No JVD, murmurs, rubs, gallops or clicks. No pedal edema. Gastrointestinal system: Abdomen is nondistended, soft and nontender. No organomegaly or masses felt. Normal bowel sounds heard. Central nervous system: Alert and oriented x 2.  No focal neurological deficits. Extremities:  No edema, No cyanosis, no clubbing Skin: No rashes, lesions or ulcers Psychiatry: Good judgment and assessment.    Data Reviewed: I have personally reviewed following labs and imaging studies  CBC: Recent Labs  Lab 02/07/20 2049 02/08/20 0245 02/09/20 0210 02/10/20 0139 02/12/20 0547  WBC 6.2 5.2 5.6  --   --   NEUTROABS 4.8  --   --   --   --   HGB 10.8* 10.0* 10.1* 10.7* 10.7*  HCT 31.8* 28.9* 29.0* 30.5* 30.1*  MCV 93.8 93.2 90.6  --   --   PLT 209 211 211  --   --    Basic Metabolic Panel: Recent Labs  Lab 02/09/20 0210 02/09/20 0210 02/10/20 0139 02/11/20 0128 02/12/20 0547 02/13/20 0330 02/14/20 0234  NA 126*   < > 125* 123* 125* 126* 125*  K 4.1   < > 3.9 4.3 4.4 4.7 4.6  CL 97*   < > 96* 93* 97* 94* 94*  CO2 23   < > 24 24 22 24 25   GLUCOSE 94   < > 110* 110* 92 99 91  BUN 28*   < > 23 23 22  27* 24*  CREATININE 1.28*   < > 1.12 1.09 1.15 1.20 1.08  CALCIUM 8.4*   < > 8.3* 8.3* 8.2* 8.3* 8.6*  MG 1.4*  --  1.8 1.6* 1.9  --  1.6*  PHOS 2.5  --  2.8 2.7 2.6  --  2.9   < > = values in this interval not displayed.   GFR: CrCl cannot be calculated (Unknown ideal weight.). Liver Function Tests: Recent Labs  Lab 02/07/20 2049  AST 20  ALT 13  ALKPHOS 48  BILITOT 0.6  PROT 5.9*  ALBUMIN 3.2*   No results for input(s): LIPASE, AMYLASE in the last 168 hours. No results for input(s): AMMONIA in the last 168 hours. Coagulation Profile: No results for input(s): INR, PROTIME in the last 168 hours. Cardiac Enzymes: No results for input(s): CKTOTAL, CKMB, CKMBINDEX, TROPONINI in the last 168 hours. BNP (last 3 results) No results for input(s): PROBNP in the last  8760 hours. HbA1C: No results for input(s): HGBA1C in the last 72 hours. CBG: Recent Labs  Lab 02/10/20 1849  GLUCAP 104*   Lipid Profile: No results for input(s): CHOL, HDL, LDLCALC, TRIG, CHOLHDL, LDLDIRECT in the last 72 hours. Thyroid Function Tests: No results for input(s): TSH, T4TOTAL, FREET4, T3FREE, THYROIDAB in the last 72 hours. Anemia Panel: No results for input(s): VITAMINB12, FOLATE, FERRITIN, TIBC, IRON, RETICCTPCT in the last 72 hours. Sepsis Labs: No results for input(s): PROCALCITON, LATICACIDVEN in the last 168 hours.  Recent Results (from the past 240 hour(s))  Respiratory Panel by RT PCR (Flu A&B, Covid) - Nasopharyngeal Swab     Status: None   Collection Time: 02/07/20  9:15  PM   Specimen: Nasopharyngeal Swab  Result Value Ref Range Status   SARS Coronavirus 2 by RT PCR NEGATIVE NEGATIVE Final    Comment: (NOTE) SARS-CoV-2 target nucleic acids are NOT DETECTED.  The SARS-CoV-2 RNA is generally detectable in upper respiratoy specimens during the acute phase of infection. The lowest concentration of SARS-CoV-2 viral copies this assay can detect is 131 copies/mL. A negative result does not preclude SARS-Cov-2 infection and should not be used as the sole basis for treatment or other patient management decisions. A negative result may occur with  improper specimen collection/handling, submission of specimen other than nasopharyngeal swab, presence of viral mutation(s) within the areas targeted by this assay, and inadequate number of viral copies (<131 copies/mL). A negative result must be combined with clinical observations, patient history, and epidemiological information. The expected result is Negative.  Fact Sheet for Patients:  PinkCheek.be  Fact Sheet for Healthcare Providers:  GravelBags.it  This test is no t yet approved or cleared by the [place] FDA and  has been authorized for  detection and/or diagnosis of SARS-CoV-2 by FDA under an Emergency Use Authorization (EUA). This EUA will remain  in effect (meaning this test can be used) for the duration of the COVID-19 declaration under Section 564(b)(1) of the Act, 21 U.S.C. section 360bbb-3(b)(1), unless the authorization is terminated or revoked sooner.     Influenza A by PCR NEGATIVE NEGATIVE Final   Influenza B by PCR NEGATIVE NEGATIVE Final    Comment: (NOTE) The Xpert Xpress SARS-CoV-2/FLU/RSV assay is intended as an aid in  the diagnosis of influenza from Nasopharyngeal swab specimens and  should not be used as a sole basis for treatment. Nasal washings and  aspirates are unacceptable for Xpert Xpress SARS-CoV-2/FLU/RSV  testing.  Fact Sheet for Patients: PinkCheek.be  Fact Sheet for Healthcare Providers: GravelBags.it  This test is not yet approved or cleared by the [place] FDA and  has been authorized for detection and/or diagnosis of SARS-CoV-2 by  FDA under an Emergency Use Authorization (EUA). This EUA will remain  in effect (meaning this test can be used) for the duration of the  Covid-19 declaration under Section 564(b)(1) of the Act, 21  U.S.C. section 360bbb-3(b)(1), unless the authorization is  terminated or revoked. Performed at [facility], [place] [address] [place], [place] [address]      Radiology Studies: No results found.  Scheduled Meds: . atomoxetine  20 mg Oral TID  . dipyridamole-aspirin  1 capsule Oral BID  . enoxaparin (LOVENOX) injection  40 mg Subcutaneous Q24H  . pravastatin  20 mg Oral q1800  . sodium chloride  2 g Oral TID WC  . vitamin B-12  500 mcg Oral Q M,W,F   Continuous Infusions:    LOS: 5 days    Time spent: 25 mins    Shawna Clamp, MD [facility]   If 7PM-7AM, please contact night-coverage

## 2020-02-14 NOTE — Progress Notes (Signed)
Physical Therapy Treatment Patient Details Name: Adam Clements MRN: [id number] DOB: June 16, 1934 Today's Date: 02/14/2020    History of Present Illness 85yo male brought to the ED after multiple episodes of syncope during which he has hit his head and lost consiciousness. EKG in NSR with known R BBB, CTH and cervical spine imaging clear of acute injury. PMH neurogenic orthostatic hypotension/autonomic failure, cardiomyopathy, supine HTN, TIA, anemia, CKD, gout, memory loss, PVD    PT Comments    Pt with improved participation in session. Pt found in chair position in bed BP 80/51, transitioned to sitting EOB BP 75/53 with pt becoming symptomatic. Daughter informed PT and OT that pt wears compression stocking at home regularly, OT donned stockings, performed transfer to recliner with LE elevated BP 112/67. Following 10 minutes of sitting in recliner with LE elevated pt 131/70, lower feet BP dropped 109/68, following 2 minutes 96/59. MD notified about compression stockings and asked for order for abdominal binder. Pt demonstrating deficits in balance, strength, coordination, endurance and safety. If pt can tolerate upright position recommend home with HHPT and use of w/c for mobility. RN notified of all vitals    Follow Up Recommendations  Home health PT;Supervision/Assistance - 24 hour     Equipment Recommendations  Wheelchair cushion (measurements PT);Wheelchair (measurements PT);Rolling walker with 5" wheels;3in1 (PT)    Recommendations for Other Services       Precautions / Restrictions Precautions Precautions: Fall;Other (comment) Precaution Comments: autonomic failure/neurogenic orthostatic hypotension- has gone syncopal in less than 20 seconds in standing with nursing, impulsive    Mobility  Bed Mobility Overal bed mobility: Needs Assistance Bed Mobility: Supine to Sit     Supine to sit: Supervision;HOB elevated        Transfers Overall transfer level: Needs  assistance Equipment used: 2 person hand held assist Transfers: Sit to/from [facility] Sit to Stand: Mod assist         General transfer comment: increased posterior lean  Ambulation/Gait                 Stairs             Wheelchair Mobility    Modified Rankin (Stroke Patients Only)       Balance                                            Cognition                                              Exercises      General Comments        Pertinent Vitals/Pain      Home Living                      Prior Function            PT Goals (current goals can now be found in the care plan section) Acute Rehab PT Goals Patient Stated Goal: I will do whatever you say PT Goal Formulation: With patient Time For Goal Achievement: 02/23/20 Potential to Achieve Goals: Fair Progress towards PT goals: Progressing toward goals    Frequency    Min 3X/week      PT Plan Frequency needs  to be updated    Co-evaluation PT/OT/SLP Co-Evaluation/Treatment: Yes Reason for Co-Treatment: For patient/therapist safety;Complexity of the patient's impairments (multi-system involvement) PT goals addressed during session: Mobility/safety with mobility;Strengthening/ROM;Balance OT goals addressed during session: Strengthening/ROM      AM-PAC PT "6 Clicks" Mobility   Outcome Measure  Help needed turning from your back to your side while in a flat bed without using bedrails?: None Help needed moving from lying on your back to sitting on the side of a flat bed without using bedrails?: None Help needed moving to and from a bed to a chair (including a wheelchair)?: A Lot Help needed standing up from a chair using your arms (e.g., wheelchair or bedside chair)?: A Lot Help needed to walk in hospital room?: Total Help needed climbing 3-5 steps with a railing? : Total 6 Click Score: 14    End of Session    Activity Tolerance: Patient tolerated treatment well Patient left: in chair;with call bell/phone within reach;with family/visitor present;with chair alarm set Nurse Communication: Mobility status PT Visit Diagnosis: Unsteadiness on feet (R26.81);Muscle weakness (generalized) (M62.81);Repeated falls (R29.6);Difficulty in walking, not elsewhere classified (R26.2)     Time: 2130-8657 PT Time Calculation (min) (ACUTE ONLY): 46 min  Charges:  $Therapeutic Activity: 38-52 mins                     Lyanne Co, DPT Acute Rehabilitation Services [phone]   [place] 02/14/2020, 2:10 PM

## 2020-02-15 ENCOUNTER — Telehealth: Payer: Self-pay | Admitting: *Deleted

## 2020-02-15 DIAGNOSIS — R55 Syncope and collapse: Secondary | ICD-10-CM | POA: Diagnosis not present

## 2020-02-15 LAB — BASIC METABOLIC PANEL
Anion gap: 9 (ref 5–15)
BUN: 22 mg/dL (ref 8–23)
CO2: 23 mmol/L (ref 22–32)
Calcium: 8.4 mg/dL — ABNORMAL LOW (ref 8.9–10.3)
Chloride: 92 mmol/L — ABNORMAL LOW (ref 98–111)
Creatinine, Ser: 1.04 mg/dL (ref 0.61–1.24)
GFR, Estimated: >60 mL/min (ref >60)
Glucose, Bld: 88 mg/dL (ref 70–99)
Potassium: 4 mmol/L (ref 3.5–5.1)
Sodium: 124 mmol/L — ABNORMAL LOW (ref 135–145)

## 2020-02-15 LAB — PHOSPHORUS: Phosphorus: 2.7 mg/dL (ref 2.5–4.6)

## 2020-02-15 LAB — SODIUM
Sodium: 125 mmol/L — ABNORMAL LOW (ref 135–145)
Sodium: 127 mmol/L — ABNORMAL LOW (ref 135–145)

## 2020-02-15 LAB — MAGNESIUM: Magnesium: 1.7 mg/dL (ref 1.7–2.4)

## 2020-02-15 MED ORDER — TOLVAPTAN 15 MG PO TABS
7.5000 mg | ORAL_TABLET | Freq: Once | ORAL | Status: AC
  Administered 2020-02-15: 7.5 mg via ORAL
  Filled 2020-02-15: qty 1

## 2020-02-15 NOTE — Progress Notes (Signed)
Physical Therapy Treatment Patient Details Name: Adam Clements MRN: [id number] DOB: [birth year] Today's Date: 02/15/2020    History of Present Illness 85yo male brought to the ED after multiple episodes of syncope during which he has hit his head and lost consiciousness. EKG in NSR with known R BBB, CTH and cervical spine imaging clear of acute injury. PMH neurogenic orthostatic hypotension/autonomic failure, cardiomyopathy, supine HTN, TIA, anemia, CKD, gout, memory loss, PVD    PT Comments    Pt lethargic during session. Pt continues to have difficulty with BP and becoming symptomatic. Increased time spent discussing discharge plans and concerns with daughter. Increased time spent educating daughter on therapy goals and plans. Increased time spent educating daughter on possible equipment needs. Daughter with increased questions throughout session, following education daughter verbalized understanding and had no further questions at this time. RN and MD notified of BP via conversation and secure chat, discussed with RN if MD is ok with elevated BP please transfer pt to recliner. Pt continues to demonstrate deficits in balance, strength, coordination, safety and endurance. Pt will benefit from skilled PT to address deficits to maximize independence with functional mobility prior to discharge   Vitals: supine with TED only 104/61; sitting TED and binder 154/111 (taken 2 x), supine TED only 122/66; RN and MD notified of increase in BP to 154/111 (pt complained of dizziness), cue to elevated diastolic BP pt returned to bed   Follow Up Recommendations  SNF;Supervision/Assistance - 24 hour     Equipment Recommendations  Wheelchair cushion (measurements PT);Wheelchair (measurements PT);Rolling walker with 5" wheels;3in1 (PT);Hospital bed    Recommendations for Other Services       Precautions / Restrictions Precautions Precautions: Fall;Other (comment) Precaution Comments: autonomic  failure/neurogenic orthostatic hypotension- has gone syncopal in less than 20 seconds in standing with nursing, impulsive    Mobility  Bed Mobility Overal bed mobility: Needs Assistance Bed Mobility: Supine to Sit;Sit to Supine     Supine to sit: Supervision;HOB elevated        Transfers                    Ambulation/Gait                 Stairs             Wheelchair Mobility    Modified Rankin (Stroke Patients Only)       Balance                                            Cognition                                              Exercises      General Comments        Pertinent Vitals/Pain      Home Living                      Prior Function            PT Goals (current goals can now be found in the care plan section) Acute Rehab PT Goals Patient Stated Goal: I will do whatever you say PT Goal Formulation: With patient Time For Goal Achievement: 02/23/20 Potential to  Achieve Goals: Fair Progress towards PT goals: Progressing toward goals    Frequency    Min 2X/week      PT Plan Frequency needs to be updated;Discharge plan needs to be updated    Co-evaluation              AM-PAC PT "6 Clicks" Mobility   Outcome Measure  Help needed turning from your back to your side while in a flat bed without using bedrails?: None Help needed moving from lying on your back to sitting on the side of a flat bed without using bedrails?: None Help needed moving to and from a bed to a chair (including a wheelchair)?: A Lot Help needed standing up from a chair using your arms (e.g., wheelchair or bedside chair)?: A Lot Help needed to walk in hospital room?: Total Help needed climbing 3-5 steps with a railing? : Total 6 Click Score: 14    End of Session   Activity Tolerance: Treatment limited secondary to medical complications (Comment) Patient left: in bed;with call bell/phone within  reach;with bed alarm set;with family/visitor present Nurse Communication: Mobility status       Time: 6811-5726 PT Time Calculation (min) (ACUTE ONLY): 42 min  Charges:  $Therapeutic Activity: 38-52 mins                     Lyanne Co, DPT Acute Rehabilitation Services [phone]   [place] 02/15/2020, 10:22 AM

## 2020-02-15 NOTE — Progress Notes (Signed)
CSW received text from daughter [name] explaining that pt's authorization had been denied. She explained she would like to go through any process possible to appeal d/c. She provides CSW with [facility] provider number.   CSW called [employer] provider number [phone] and spoke with precertification representative. The representative did say that the authorization had been denied. CSW requested a peer-to-peer review. Representative explained that no peer-to-peer is being offered. Representative explained that based on [employer] criteria, pt more appropriate for [facility] or LTC versus SNF for rehab. CSW requested instructions on how to appeal the decision. Representative then gave CSW the provider number and same option that he called on. When CSW questioned this, the representative stated that the member/family would need to call the member phone number on the back of pt's card. She gave no further instructions on how to appeal d/c.

## 2020-02-15 NOTE — Progress Notes (Signed)
PROGRESS NOTE    Adam Clements  GQB:[id number] DOB: Nov 20, 1934 DOA: 02/07/2020 PCP: Ria Bush, MD   Brief Narrative:  This 85 y.o.malewithhistory of neurogenic orthostatic hypotension, nonischemic cardiomyopathy last EF measured was 35 to 40% with grade 2 diastolic dysfunction, supine hypertension, TIA, chronic anemia, chronic hyponatremia, chronic kidney disease stage III was brought to the ER after patient had at least 2 episodes of syncope at home.  He was recently started on Strattera by his cardiologist for autonomic orthostatic hypotension. Patient is admitted for recurrent syncope.  MRI head unremarkable.    Patient had syncopal episode twice during hospitalization.  Patient has chronic hyponatremia.  Patient is started on sodium tablets as per nephrology recommendation.  Sodium is improving. PT and OT recommended nursing home placement.   Assessment & Plan:   Principal Problem:   Syncope and collapse Active Problems:   NICM (nonischemic cardiomyopathy) (HCC)   Autonomic failure   Near syncope  1. Syncope with history of autonomic orthostatic hypotension : recently started on Strattera.  Given the worsening function, MRI brain was done which was unremarkable.   PT and OT recommended skilled nursing facility.  Continue monitoring in telemetry , Cardiology consulted recommended increasing the dose of Strattera, given worsening orthostatic hypotension.  Statterra dose increased to 3 times daily,  repeat echocardiogram showed EF 35 to 40%. Cardiology recommended continuing Strattera, and outpatient follow-up with neurology.  Patient has another episode 11/19 when he passed out.  Neurology consulted recommended continue current management.  Orthostatic hypotension has improved with abdominal binders and TED hose but patient has intermittent symptoms of dizziness.  With his autonomic orthostatic hypotension, I doubt that patient will ever be asymptomatic.  I did discuss this with  the daughter at the bedside.  2. Hyponatremia appears to be chronic at this time.  TSH and cortisol normal. Nephrology consulted, recommended fluid restriction and increase salt tablets 2 g 3 times daily.  Sodium stable at 124.  His baseline seems to be between 125 and 132.  3. History of TIA: Continue Aggrenox and statins.  4. Chronic anemia :  being followed by patient's primary care physician.  Hemoglobin appears to be at baseline.  5. Supine hypertension as per patient's cardiology notes was recently started on hydralazine at bedtime.    6. Nonischemic cardiomyopathy: appears compensated.   DVT prophylaxis:  Lovenox Code Status:Full Family Communication: Lengthy discussion with the daughter at the bedside.  Answered several questions.  She could not think of any specific questions but kept thinking at the bedside.  I did explain to her that she can reach out to the nurse when she figures out more questions and that nurse can be asked to page me if she would like for me to give her a call again. Disposition Plan: Status is: Inpatient  Remains inpatient appropriate because:Inpatient level of care appropriate due to severity of illness   Dispo: The patient is from: Home              Anticipated d/c is to: SNF              Anticipated d/c date is: 2 days              Patient currently is not medically stable to d/c.  Consultants:   Cardiology  Procedures:  None Antimicrobials:   Anti-infectives (From admission, onward)   None      Subjective: Seen and examined.  Patient was alert and oriented but I do think  that patient may be having some underlying undiagnosed dementia.  Daughter was talking as advocate for him.  Patient himself had no complaints.  Earlier when he worked with PT, he did feel dizzy.   Objective: Vitals:   02/14/20 1540 02/14/20 1734 02/14/20 2058 02/15/20 0433  BP: 133/74 (!) 162/92 (!) 151/87 (!) 156/96  Pulse: 65 70 73 75  Resp:  20 20 20   Temp:   97.8 F (36.6 C) 98.8 F (37.1 C) 98.3 F (36.8 C)  TempSrc:  Axillary Axillary Axillary  SpO2:  98% 97% 95%    Intake/Output Summary (Last 24 hours) at 02/15/2020 1144 Last data filed at 02/15/2020 0800 Gross per 24 hour  Intake 360 ml  Output 1300 ml  Net -940 ml   There were no vitals filed for this visit.  Examination:  General exam: Appears calm and comfortable  Respiratory system: Clear to auscultation. Respiratory effort normal. Cardiovascular system: S1 & S2 heard, RRR. No JVD, murmurs, rubs, gallops or clicks. No pedal edema. Gastrointestinal system: Abdomen is nondistended, soft and nontender. No organomegaly or masses felt. Normal bowel sounds heard. Central nervous system: Alert and oriented. No focal neurological deficits. Extremities: Symmetric 5 x 5 power. Skin: No rashes, lesions or ulcers.  Psychiatry: Judgement and insight appear poor. Mood & affect appropriate.    Data Reviewed: I have personally reviewed following labs and imaging studies  CBC: Recent Labs  Lab 02/09/20 0210 02/10/20 0139 02/12/20 0547  WBC 5.6  --   --   HGB 10.1* 10.7* 10.7*  HCT 29.0* 30.5* 30.1*  MCV 90.6  --   --   PLT 211  --   --    Basic Metabolic Panel: Recent Labs  Lab 02/10/20 0139 02/10/20 0139 02/11/20 0128 02/12/20 0547 02/13/20 0330 02/14/20 0234 02/15/20 0539  NA 125*   < > 123* 125* 126* 125* 124*  K 3.9   < > 4.3 4.4 4.7 4.6 4.0  CL 96*   < > 93* 97* 94* 94* 92*  CO2 24   < > 24 22 24 25 23   GLUCOSE 110*   < > 110* 92 99 91 88  BUN 23   < > 23 22 27* 24* 22  CREATININE 1.12   < > 1.09 1.15 1.20 1.08 1.04  CALCIUM 8.3*   < > 8.3* 8.2* 8.3* 8.6* 8.4*  MG 1.8  --  1.6* 1.9  --  1.6* 1.7  PHOS 2.8  --  2.7 2.6  --  2.9 2.7   < > = values in this interval not displayed.   GFR: CrCl cannot be calculated (Unknown ideal weight.). Liver Function Tests: No results for input(s): AST, ALT, ALKPHOS, BILITOT, PROT, ALBUMIN in the last 168 hours. No results  for input(s): LIPASE, AMYLASE in the last 168 hours. No results for input(s): AMMONIA in the last 168 hours. Coagulation Profile: No results for input(s): INR, PROTIME in the last 168 hours. Cardiac Enzymes: No results for input(s): CKTOTAL, CKMB, CKMBINDEX, TROPONINI in the last 168 hours. BNP (last 3 results) No results for input(s): PROBNP in the last 8760 hours. HbA1C: No results for input(s): HGBA1C in the last 72 hours. CBG: Recent Labs  Lab 02/10/20 1849  GLUCAP 104*   Lipid Profile: No results for input(s): CHOL, HDL, LDLCALC, TRIG, CHOLHDL, LDLDIRECT in the last 72 hours. Thyroid Function Tests: No results for input(s): TSH, T4TOTAL, FREET4, T3FREE, THYROIDAB in the last 72 hours. Anemia Panel: No results for input(s): VITAMINB12,  FOLATE, FERRITIN, TIBC, IRON, RETICCTPCT in the last 72 hours. Sepsis Labs: No results for input(s): PROCALCITON, LATICACIDVEN in the last 168 hours.  Recent Results (from the past 240 hour(s))  Respiratory Panel by RT PCR (Flu A&B, Covid) - Nasopharyngeal Swab     Status: None   Collection Time: 02/07/20  9:15 PM   Specimen: Nasopharyngeal Swab  Result Value Ref Range Status   SARS Coronavirus 2 by RT PCR NEGATIVE NEGATIVE Final    Comment: (NOTE) SARS-CoV-2 target nucleic acids are NOT DETECTED.  The SARS-CoV-2 RNA is generally detectable in upper respiratoy specimens during the acute phase of infection. The lowest concentration of SARS-CoV-2 viral copies this assay can detect is 131 copies/mL. A negative result does not preclude SARS-Cov-2 infection and should not be used as the sole basis for treatment or other patient management decisions. A negative result may occur with  improper specimen collection/handling, submission of specimen other than nasopharyngeal swab, presence of viral mutation(s) within the areas targeted by this assay, and inadequate number of viral copies (<131 copies/mL). A negative result must be combined with  clinical observations, patient history, and epidemiological information. The expected result is Negative.  Fact Sheet for Patients:  PinkCheek.be  Fact Sheet for Healthcare Providers:  GravelBags.it  This test is no t yet approved or cleared by the [place] FDA and  has been authorized for detection and/or diagnosis of SARS-CoV-2 by FDA under an Emergency Use Authorization (EUA). This EUA will remain  in effect (meaning this test can be used) for the duration of the COVID-19 declaration under Section 564(b)(1) of the Act, 21 U.S.C. section 360bbb-3(b)(1), unless the authorization is terminated or revoked sooner.     Influenza A by PCR NEGATIVE NEGATIVE Final   Influenza B by PCR NEGATIVE NEGATIVE Final    Comment: (NOTE) The Xpert Xpress SARS-CoV-2/FLU/RSV assay is intended as an aid in  the diagnosis of influenza from Nasopharyngeal swab specimens and  should not be used as a sole basis for treatment. Nasal washings and  aspirates are unacceptable for Xpert Xpress SARS-CoV-2/FLU/RSV  testing.  Fact Sheet for Patients: PinkCheek.be  Fact Sheet for Healthcare Providers: GravelBags.it  This test is not yet approved or cleared by the [place] FDA and  has been authorized for detection and/or diagnosis of SARS-CoV-2 by  FDA under an Emergency Use Authorization (EUA). This EUA will remain  in effect (meaning this test can be used) for the duration of the  Covid-19 declaration under Section 564(b)(1) of the Act, 21  U.S.C. section 360bbb-3(b)(1), unless the authorization is  terminated or revoked. Performed at [facility], [place] [address] [place], [place] [address]      Radiology Studies: No results found.  Scheduled Meds: . atomoxetine  20 mg Oral TID  . dipyridamole-aspirin  1 capsule Oral BID  . enoxaparin (LOVENOX) injection  40 mg  Subcutaneous Q24H  . pravastatin  20 mg Oral q1800  . sodium chloride  2 g Oral TID WC  . vitamin B-12  500 mcg Oral Q M,W,F   Continuous Infusions:    LOS: 6 days    Time spent: 30 mins  Darliss Cheney, MD [facility]   If 7PM-7AM, please contact night-coverage

## 2020-02-15 NOTE — Progress Notes (Signed)
[facility] Progress Note  Subjective:   Had 1.3 liters UOP over 11/23 charted.  Another daughter has been with him here today.  When I mentioned heart failure she was worried as she hadn't known he had CHF - we discussed this as well.   Review of systems: he denies shortness of breath Hasn't been agitated  Feels ok  No nausea or vomiting   Vitals:   02/14/20 1540 02/14/20 1734 02/14/20 2058 02/15/20 0433  BP: 133/74 (!) 162/92 (!) 151/87 (!) 156/96  Pulse: 65 70 73 75  Resp:  20 20 20   Temp:  97.8 F (36.6 C) 98.8 F (37.1 C) 98.3 F (36.8 C)  TempSrc:  Axillary Axillary Axillary  SpO2:  98% 97% 95%    Exam: Gen elderly thin WM; very hard of hearing   NCAT sclera anicteric Chest clear bilat to bases S1S2 no rub Abd soft ntnd  Ext no leg or UE edema  Neuro alert and conversant but hard of hearing     UA negative    UNa 85, UOsm 665     Na 123  CO2 24  BUN 23  Cr 1.09         CXR 11/16 - negative             ECHO 11.17 - LVEF 35-40%  Assessment/ Plan: 1. Hyponatremia thyroid and adrenal testing wnl. Looks dry on exam or euvolemic, not vol overloaded. UNa is high at 80 and UOsm high at 650.  Hyponatremia per labs in the [facility] system this year.   1. He has been on salt tabs and is on fluid restriction to 1000 cc/day.  Got NS 11/22 limited duration as he seemed slightly dry and na actually went down a point  2. Tolvaptan 7.5 mg PO once today (one -time dose)  3. Check sodium level every 6 hours x 24 hours after the tolvaptan  4. Order to notify nephrology attending/Judythe Postema if Na rises above 131 on 11/24 or 11/25  5. Discussed with patient and his daughter to discontinue fluid restriction for 24 hours after tolvaptan  2. Orthostatic hypotension - admitted with syncope.  Sig orthostatic BP drop w/o HR increase in sitting position on 11/20.  This is a chronic condition apparently.  Note orthostatic hypotension is part of several neurologic conditions primarily  Parkinson's, also multisystem atrophy and other dementias. Amyloidosis is another entity to consider. Seen by cardiology who recommended avoiding fluorinef for now and trying salt tabs.  Note not on diuretics at home 1. Would cont abd binder/ compression stockings as in the past.    3. Agitation - noted. Appreciate staff and primary team efforts 4. Chronic systolic CHF with EF 79-48% - euvolemic on exam 5. Gout per primary team  6. HL per primary team  7. Memory impairment - per charting- off and on, not severe per family   Recent Labs  Lab 02/10/20 0139 02/11/20 0128 02/12/20 0547 02/13/20 0330 02/14/20 0234 02/15/20 0539  K 3.9   < > 4.4   < > 4.6 4.0  BUN 23   < > 22   < > 24* 22  CREATININE 1.12   < > 1.15   < > 1.08 1.04  CALCIUM 8.3*   < > 8.2*   < > 8.6* 8.4*  PHOS 2.8   < > 2.6  --  2.9 2.7  HGB 10.7*  --  10.7*  --   --   --    < > =  values in this interval not displayed.   Inpatient medications: . atomoxetine  20 mg Oral TID  . dipyridamole-aspirin  1 capsule Oral BID  . pravastatin  20 mg Oral q1800  . sodium chloride  2 g Oral TID WC  . vitamin B-12  500 mcg Oral Q M,W,F    acetaminophen **OR** acetaminophen, labetalol   Claudia Desanctis, MD 02/15/2020  12:18 PM

## 2020-02-15 NOTE — Telephone Encounter (Signed)
Patient's wife left a voicemail stating that she wanted to let Dr. Danise Mina know that he is in the hospital.

## 2020-02-16 DIAGNOSIS — R55 Syncope and collapse: Secondary | ICD-10-CM | POA: Diagnosis not present

## 2020-02-16 LAB — BASIC METABOLIC PANEL
Anion gap: 9 (ref 5–15)
BUN: 27 mg/dL — ABNORMAL HIGH (ref 8–23)
CO2: 23 mmol/L (ref 22–32)
Calcium: 8.8 mg/dL — ABNORMAL LOW (ref 8.9–10.3)
Chloride: 97 mmol/L — ABNORMAL LOW (ref 98–111)
Creatinine, Ser: 1.04 mg/dL (ref 0.61–1.24)
GFR, Estimated: >60 mL/min (ref >60)
Glucose, Bld: 97 mg/dL (ref 70–99)
Potassium: 4.2 mmol/L (ref 3.5–5.1)
Sodium: 129 mmol/L — ABNORMAL LOW (ref 135–145)

## 2020-02-16 LAB — SODIUM
Sodium: 129 mmol/L — ABNORMAL LOW (ref 135–145)
Sodium: 129 mmol/L — ABNORMAL LOW (ref 135–145)

## 2020-02-16 MED ORDER — MAGNESIUM OXIDE 400 (241.3 MG) MG PO TABS
400.0000 mg | ORAL_TABLET | Freq: Every day | ORAL | Status: DC
  Administered 2020-02-16 – 2020-02-22 (×7): 400 mg via ORAL
  Filled 2020-02-16 (×7): qty 1

## 2020-02-16 MED ORDER — LABETALOL HCL 5 MG/ML IV SOLN: 10.0000 mg | INTRAVENOUS | Status: DC | PRN

## 2020-02-16 NOTE — Progress Notes (Signed)
[facility] Progress Note  Subjective:   Had 2 liters UOP over 11/24 charted.  He got a dose of tolvaptan on 11/24 - 7.5 mg PO once at 14:57.  Interim rise in Na to 129.  Gives me the "ok" sign when I come in.  Feels better.  I spoke with his daughter at bedside.  He hasn't stood per his daughter and they are worried about weakness and managing him at home.  Prior to admission he feel over 5 times in a single day she states.  He is normally managing a farm.    Review of systems: - very hard of hearing and his hearing aid batteries have died which limits he denies shortness of breath No agitation - easier morning No nausea or vomiting  No chest pain  Vitals:   02/15/20 1222 02/15/20 1807 02/15/20 2055 02/16/20 0645  BP: 127/72 (!) 157/86 (!) 176/93 (!) 178/93  Pulse: 70 72 70 77  Resp: 16 16 18 15   Temp: 98.2 F (36.8 C) 98 F (36.7 C) 97.9 F (36.6 C) 98.6 F (37 C)  TempSrc: Oral Oral Oral Oral  SpO2: 96% 99% 97% 98%    Exam: Gen elderly thin WM; very hard of hearing     NCAT sclera anicteric Chest clear bilat to bases S1S2 no rub Abd soft ntnd  Ext no leg or UE edema  Neuro alert and conversant but hard of hearing Psych no anxiety or agitation.      UA negative    UNa 85, UOsm 665     Na 123  CO2 24  BUN 23  Cr 1.09         CXR 11/16 - negative             ECHO 11.17 - LVEF 35-40%  Assessment/ Plan: 1. Hyponatremia thyroid and adrenal testing wnl. Looks dry on exam or euvolemic, not vol overloaded. UNa is high at 80 and UOsm high at 650.  Hyponatremia per labs in the [facility] system this year.   1. He has been on salt tabs and is on fluid restriction to 1000 cc/day.  Got NS 11/22 very limited duration as he seemed slightly dry and na actually went down a point.  He got tolvaptan 7.5 mg PO one-time-only on 11/24 2. Follow Na trends to see if he will maintain 3. Will resume fluid restriction 24 hours after tolvaptan - later today  4. Order is in to notify  nephrology if sodium rises above 131 today 2. Orthostatic hypotension - admitted with syncope.  Sig orthostatic BP drop w/o HR increase in sitting position on 11/20.  This is a chronic condition apparently.  Note orthostatic hypotension is part of several neurologic conditions primarily Parkinson's, also multisystem atrophy and other dementias. Amyloidosis is another entity to consider. Seen by cardiology who recommended avoiding fluorinef for now and trying salt tabs.  Note not on diuretics at home 1. Would cont abd binder/ compression stockings as in the past.    3. Agitation - noted. Appreciate staff and primary team efforts 4. Chronic systolic CHF with EF 95-18% - euvolemic on exam 5. Gout per primary team  6. HL per primary team  7. Memory impairment - per charting- off and on, not severe per family   Recent Labs  Lab 02/10/20 0139 02/11/20 0128 02/12/20 0547 02/13/20 0330 02/14/20 0234 02/14/20 0234 02/15/20 0539 02/16/20 0629  K 3.9   < > 4.4   < > 4.6   < >  4.0 4.2  BUN 23   < > 22   < > 24*   < > 22 27*  CREATININE 1.12   < > 1.15   < > 1.08   < > 1.04 1.04  CALCIUM 8.3*   < > 8.2*   < > 8.6*   < > 8.4* 8.8*  PHOS 2.8   < > 2.6  --  2.9  --  2.7  --   HGB 10.7*  --  10.7*  --   --   --   --   --    < > = values in this interval not displayed.   Inpatient medications: . atomoxetine  20 mg Oral TID  . dipyridamole-aspirin  1 capsule Oral BID  . pravastatin  20 mg Oral q1800  . sodium chloride  2 g Oral TID WC  . vitamin B-12  500 mcg Oral Q M,W,F    acetaminophen **OR** acetaminophen, labetalol   Claudia Desanctis, MD 02/16/2020  11:02 AM

## 2020-02-16 NOTE — Progress Notes (Signed)
PROGRESS NOTE    Adam Clements  HUD:[id number] DOB: [birth year] DOA: 02/07/2020 PCP: Ria Bush, MD   Brief Narrative:  This 85 y.o.malewithhistory of neurogenic orthostatic hypotension, nonischemic cardiomyopathy last EF measured was 35 to 40% with grade 2 diastolic dysfunction, supine hypertension, TIA, chronic anemia, chronic hyponatremia, chronic kidney disease stage III was brought to the ER after patient had at least 2 episodes of syncope at home.  He was recently started on Strattera by his cardiologist for autonomic orthostatic hypotension. Patient is admitted for recurrent syncope.  MRI head unremarkable.    Patient had syncopal episode twice during hospitalization.    Assessment & Plan:   Principal Problem:   Syncope and collapse Active Problems:   NICM (nonischemic cardiomyopathy) (HCC)   Autonomic failure   Near syncope   Syncope with history of severe autonomic orthostatic hypotension  - on Strattera outpatient -follows with Dr. Caryl Comes.   - MRI brain was done which was unremarkable.   - PT and OT recommended skilled nursing facility -Cardiology consulted recommended increasing the dose of Strattera, given worsening orthostatic hypotension.  Statterra dose increased to 3 times daily,  repeat echocardiogram showed EF 35 to 40%. Cardiology recommended continuing Strattera, and outpatient follow-up with neurology.  - Patient has another episode 11/19 when he passed out.  Neurology consulted recommended continue current management.  Orthostatic hypotension has improved with abdominal binders and compression stockings  -continues PT evals-- patient and daughter motivated to get patient in a safe d/c situation.  I think he would benefit from short term rehab prior to d/c home with wife - I also asked family to work on arranging 24-7 care in case insurance appeal was denied.   Hyponatremia  -TSH and cortisol normal. Nephrology consulted  History of TIA: Continue  Aggrenox and statins.  Chronic anemia  being followed by patient's primary care physician.  Hemoglobin appears to be at baseline.  Supine hypertension  -need to allow as he has severe orthostatic hypotension   Nonischemic cardiomyopathy: -seen by cardiology   DVT prophylaxis:  Lovenox Code Status: DNR Family Communication:  Spoke with daughter at bedside Disposition Plan: Status is: Inpatient  Remains inpatient appropriate because:Inpatient level of care appropriate due to severity of illness   Dispo: The patient is from: Home              Anticipated d/c is to: SNF vs home--               Anticipated d/c date is: 2 days              Patient currently is not medically stable to d/c. needs safe d/c plan and stable Na  Consultants:   Cardiology  Nephrology  neurology   Subjective: Got up to chair with help of family and 2 nurses-- did not syncopize   Objective: Vitals:   02/15/20 1222 02/15/20 1807 02/15/20 2055 02/16/20 0645  BP: 127/72 (!) 157/86 (!) 176/93 (!) 178/93  Pulse: 70 72 70 77  Resp: 16 16 18 15   Temp: 98.2 F (36.8 C) 98 F (36.7 C) 97.9 F (36.6 C) 98.6 F (37 C)  TempSrc: Oral Oral Oral Oral  SpO2: 96% 99% 97% 98%    Intake/Output Summary (Last 24 hours) at 02/16/2020 1210 Last data filed at 02/16/2020 0701 Gross per 24 hour  Intake 240 ml  Output 2600 ml  Net -2360 ml   There were no vitals filed for this visit.  Examination:   General: Appearance:  elderly male in no acute distress     Lungs:     respirations unlabored  Heart:    Normal heart rate. Normal rhythm.    MS:   All extremities are intact. - wearing compression stockings and abdominal binder  Neurologic:   Awake, alert, very hard of hearing     Data Reviewed: I have personally reviewed following labs and imaging studies  CBC: Recent Labs  Lab 02/10/20 0139 02/12/20 0547  HGB 10.7* 10.7*  HCT 30.5* 99.2*   Basic Metabolic Panel: Recent Labs  Lab  02/10/20 0139 02/10/20 0139 02/11/20 0128 02/11/20 0128 02/12/20 0547 02/12/20 0547 02/13/20 0330 02/13/20 0330 02/14/20 0234 02/14/20 0234 02/15/20 0539 02/15/20 1234 02/15/20 1839 02/16/20 0011 02/16/20 0629  NA 125*   < > 123*   < > 125*   < > 126*   < > 125*   < > 124* 125* 127* 129* 129*  K 3.9   < > 4.3   < > 4.4  --  4.7  --  4.6  --  4.0  --   --   --  4.2  CL 96*   < > 93*   < > 97*  --  94*  --  94*  --  92*  --   --   --  97*  CO2 24   < > 24   < > 22  --  24  --  25  --  23  --   --   --  23  GLUCOSE 110*   < > 110*   < > 92  --  99  --  91  --  88  --   --   --  97  BUN 23   < > 23   < > 22  --  27*  --  24*  --  22  --   --   --  27*  CREATININE 1.12   < > 1.09   < > 1.15  --  1.20  --  1.08  --  1.04  --   --   --  1.04  CALCIUM 8.3*   < > 8.3*   < > 8.2*  --  8.3*  --  8.6*  --  8.4*  --   --   --  8.8*  MG 1.8  --  1.6*  --  1.9  --   --   --  1.6*  --  1.7  --   --   --   --   PHOS 2.8  --  2.7  --  2.6  --   --   --  2.9  --  2.7  --   --   --   --    < > = values in this interval not displayed.   GFR: CrCl cannot be calculated (Unknown ideal weight.). Liver Function Tests: No results for input(s): AST, ALT, ALKPHOS, BILITOT, PROT, ALBUMIN in the last 168 hours. No results for input(s): LIPASE, AMYLASE in the last 168 hours. No results for input(s): AMMONIA in the last 168 hours. Coagulation Profile: No results for input(s): INR, PROTIME in the last 168 hours. Cardiac Enzymes: No results for input(s): CKTOTAL, CKMB, CKMBINDEX, TROPONINI in the last 168 hours. BNP (last 3 results) No results for input(s): PROBNP in the last 8760 hours. HbA1C: No results for input(s): HGBA1C in the last 72 hours. CBG: Recent Labs  Lab 02/10/20 1849  GLUCAP  104*   Lipid Profile: No results for input(s): CHOL, HDL, LDLCALC, TRIG, CHOLHDL, LDLDIRECT in the last 72 hours. Thyroid Function Tests: No results for input(s): TSH, T4TOTAL, FREET4, T3FREE, THYROIDAB in the  last 72 hours. Anemia Panel: No results for input(s): VITAMINB12, FOLATE, FERRITIN, TIBC, IRON, RETICCTPCT in the last 72 hours. Sepsis Labs: No results for input(s): PROCALCITON, LATICACIDVEN in the last 168 hours.  Recent Results (from the past 240 hour(s))  Respiratory Panel by RT PCR (Flu A&B, Covid) - Nasopharyngeal Swab     Status: None   Collection Time: 02/07/20  9:15 PM   Specimen: Nasopharyngeal Swab  Result Value Ref Range Status   SARS Coronavirus 2 by RT PCR NEGATIVE NEGATIVE Final    Comment: (NOTE) SARS-CoV-2 target nucleic acids are NOT DETECTED.  The SARS-CoV-2 RNA is generally detectable in upper respiratoy specimens during the acute phase of infection. The lowest concentration of SARS-CoV-2 viral copies this assay can detect is 131 copies/mL. A negative result does not preclude SARS-Cov-2 infection and should not be used as the sole basis for treatment or other patient management decisions. A negative result may occur with  improper specimen collection/handling, submission of specimen other than nasopharyngeal swab, presence of viral mutation(s) within the areas targeted by this assay, and inadequate number of viral copies (<131 copies/mL). A negative result must be combined with clinical observations, patient history, and epidemiological information. The expected result is Negative.  Fact Sheet for Patients:  PinkCheek.be  Fact Sheet for Healthcare Providers:  GravelBags.it  This test is no t yet approved or cleared by the [place] FDA and  has been authorized for detection and/or diagnosis of SARS-CoV-2 by FDA under an Emergency Use Authorization (EUA). This EUA will remain  in effect (meaning this test can be used) for the duration of the COVID-19 declaration under Section 564(b)(1) of the Act, 21 U.S.C. section 360bbb-3(b)(1), unless the authorization is terminated or revoked sooner.      Influenza A by PCR NEGATIVE NEGATIVE Final   Influenza B by PCR NEGATIVE NEGATIVE Final    Comment: (NOTE) The Xpert Xpress SARS-CoV-2/FLU/RSV assay is intended as an aid in  the diagnosis of influenza from Nasopharyngeal swab specimens and  should not be used as a sole basis for treatment. Nasal washings and  aspirates are unacceptable for Xpert Xpress SARS-CoV-2/FLU/RSV  testing.  Fact Sheet for Patients: PinkCheek.be  Fact Sheet for Healthcare Providers: GravelBags.it  This test is not yet approved or cleared by the [place] FDA and  has been authorized for detection and/or diagnosis of SARS-CoV-2 by  FDA under an Emergency Use Authorization (EUA). This EUA will remain  in effect (meaning this test can be used) for the duration of the  Covid-19 declaration under Section 564(b)(1) of the Act, 21  U.S.C. section 360bbb-3(b)(1), unless the authorization is  terminated or revoked. Performed at [facility], [place] [address] [place], [place] [address]      Radiology Studies: No results found.  Scheduled Meds: . atomoxetine  20 mg Oral TID  . dipyridamole-aspirin  1 capsule Oral BID  . magnesium oxide  400 mg Oral Daily  . pravastatin  20 mg Oral q1800  . sodium chloride  2 g Oral TID WC  . vitamin B-12  500 mcg Oral Q M,W,F   Continuous Infusions:    LOS: 7 days    Time spent: 30 mins  Geradine Girt, DO [facility]   If 7PM-7AM, please contact night-coverage

## 2020-02-17 DIAGNOSIS — R55 Syncope and collapse: Secondary | ICD-10-CM | POA: Diagnosis not present

## 2020-02-17 LAB — BASIC METABOLIC PANEL
Anion gap: 12 (ref 5–15)
BUN: 29 mg/dL — ABNORMAL HIGH (ref 8–23)
CO2: 21 mmol/L — ABNORMAL LOW (ref 22–32)
Calcium: 8.8 mg/dL — ABNORMAL LOW (ref 8.9–10.3)
Chloride: 96 mmol/L — ABNORMAL LOW (ref 98–111)
Creatinine, Ser: 1.12 mg/dL (ref 0.61–1.24)
GFR, Estimated: >60 mL/min (ref >60)
Glucose, Bld: 92 mg/dL (ref 70–99)
Potassium: 4 mmol/L (ref 3.5–5.1)
Sodium: 129 mmol/L — ABNORMAL LOW (ref 135–145)

## 2020-02-17 LAB — VITAMIN B12: Vitamin B-12: 2325 pg/mL — ABNORMAL HIGH (ref 180–914)

## 2020-02-17 LAB — VITAMIN D 25 HYDROXY (VIT D DEFICIENCY, FRACTURES): Vit D, 25-Hydroxy: 44.19 ng/mL (ref 30–100)

## 2020-02-17 NOTE — Progress Notes (Signed)
[facility] Progress Note  Subjective:   Patient states he feels okay but is having a lot of dizziness.  Some confusion and difficulty answering other questions.  Denies shortness of breath or chest pain.  Daughter feels that the patient's cognition is overall significantly improved.  Sodium stable at 129  Review of systems: 12 systems reviewed and negative except per subjective  Vitals:   02/16/20 1217 02/16/20 1747 02/16/20 2351 02/17/20 0417  BP: (!) 160/81 (!) 158/82 (!) 150/86 140/87  Pulse: 72 80 72 82  Resp: 17 18 16 20   Temp: 98.1 F (36.7 C) 98.7 F (37.1 C) (!) 97.4 F (36.3 C) 97.6 F (36.4 C)  TempSrc: Oral Oral Oral Oral  SpO2: 97%  97% 97%    Exam: Gen elderly thin WM; very hard of hearing     NCAT sclera anicteric Chest clear bilat to bases S1S2 no rub Abd soft ntnd  Ext no leg or UE edema  Neuro alert and conversant but hard of hearing Psych no anxiety or agitation.    Assessment/ Plan: 1. Hyponatremia: thyroid and adrenal testing wnl. No signs of volume overload UNa is high at 80 and UOsm high at 650.  Long standing issue. Unclear cause but possible idiopathic SIADH or ADH stimulation related to intermittent poor renal perfusion from autonomic dysfunction  1. Responded well to tolvaptan on 7/24 2. Na stable at 129 3. Goal is ~130 4. Continue 2g Na tabs TID 5. No obvious need for IV hydration today 6. Could consider more fluid restriction 7. Will plan to f/u w/ Dr. Royce Macadamia 8. Will sign off tomorrow if Na remains stable on tabs 2. Orthostatic hypotension - admitted with syncope.  Sig orthostatic BP drop w/o HR increase in sitting position on 11/20.  This is a chronic condition apparently.  Note orthostatic hypotension is part of several neurologic conditions primarily Parkinson's, also multisystem atrophy and other dementias. Amyloidosis is another entity to consider. Seen by cardiology who recommended avoiding fluorinef for now and trying  salt tabs.  Note not on diuretics at home 1. Would cont abd binder/ compression stockings as in the past.    3. Agitation - noted. Appreciate staff and primary team efforts 4. Chronic systolic CHF with EF 50-09% - euvolemic on exam 5. Gout per primary team  6. HL per primary team  7. Memory impairment - per charting- off and on, not severe per family   Recent Labs  Lab 02/12/20 0547 02/13/20 0330 02/14/20 0234 02/14/20 0234 02/15/20 0539 02/15/20 0539 02/16/20 0629 02/17/20 0414  K 4.4   < > 4.6   < > 4.0   < > 4.2 4.0  BUN 22   < > 24*   < > 22   < > 27* 29*  CREATININE 1.15   < > 1.08   < > 1.04   < > 1.04 1.12  CALCIUM 8.2*   < > 8.6*   < > 8.4*   < > 8.8* 8.8*  PHOS 2.6  --  2.9  --  2.7  --   --   --   HGB 10.7*  --   --   --   --   --   --   --    < > = values in this interval not displayed.   Inpatient medications: . atomoxetine  20 mg Oral TID  . dipyridamole-aspirin  1 capsule Oral BID  . magnesium oxide  400 mg Oral Daily  .  pravastatin  20 mg Oral q1800  . sodium chloride  2 g Oral TID WC    acetaminophen **OR** acetaminophen, labetalol   Reesa Chew, MD 02/17/2020  10:19 AM

## 2020-02-17 NOTE — Progress Notes (Signed)
PROGRESS NOTE    Adam Clements  RWE:[id number] DOB: Nov 07, 1934 DOA: 02/07/2020 PCP: Ria Bush, MD   Brief Narrative:  This 85 y.o.malewithhistory of neurogenic orthostatic hypotension, nonischemic cardiomyopathy last EF measured was 35 to 40% with grade 2 diastolic dysfunction, supine hypertension, TIA, chronic anemia, chronic hyponatremia, chronic kidney disease stage III was brought to the ER after patient had at least 2 episodes of syncope at home.  He was recently started on Strattera by his cardiologist for autonomic orthostatic hypotension. Patient is admitted for recurrent syncope.  MRI head unremarkable.    Patient had syncopal episodes twice during hospitalization.    Assessment & Plan:   Principal Problem:   Syncope and collapse Active Problems:   NICM (nonischemic cardiomyopathy) (HCC)   Autonomic failure   Near syncope   Syncope with history of severe autonomic orthostatic hypotension  - on Strattera outpatient -follows with Dr. Caryl Comes.   - MRI brain was done which was unremarkable.   - PT and OT recommended skilled nursing facility -Cardiology consulted recommended increasing the dose of Strattera, given worsening orthostatic hypotension.  Statterra dose increased to 3 times daily,  repeat echocardiogram showed EF 35 to 40%. Cardiology recommended continuing Strattera, and outpatient follow-up with neurology.  - Patient has another episode 11/19 when he passed out.  Neurology consulted recommended continue current management.  Orthostatic hypotension has improved with abdominal binders and compression stockings  -continues PT evals-- patient and daughter motivated to get patient in a safe d/c situation.  I think he would benefit from short term rehab prior to d/c home with wife - I also asked family to work on arranging 24-7 care in case insurance appeal was denied- DME equipment ordered  Hyponatremia  -TSH and cortisol normal. Nephrology consulted  History  of TIA: Continue Aggrenox and statins.  Chronic anemia  being followed by patient's primary care physician.  Hemoglobin appears to be at baseline.  Supine hypertension  -need to allow as he has severe orthostatic hypotension   Nonischemic cardiomyopathy: -seen by cardiology   DVT prophylaxis:  Lovenox Code Status: DNR Family Communication:  Spoke with daughter at bedside Disposition Plan: Status is: Inpatient  Remains inpatient appropriate because:Inpatient level of care appropriate due to severity of illness   Dispo: The patient is from: Home              Anticipated d/c is to: SNF vs home--               Anticipated d/c date is: 2 days              Patient currently is not medically stable to d/c. needs safe d/c plan and stable Na  Consultants:   Cardiology  Nephrology  neurology   Subjective: No current complaints-- very active when sleeping with movement   Objective: Vitals:   02/16/20 1747 02/16/20 2351 02/17/20 0417 02/17/20 1222  BP: (!) 158/82 (!) 150/86 140/87 135/82  Pulse: 80 72 82 70  Resp: 18 16 20 18   Temp: 98.7 F (37.1 C) (!) 97.4 F (36.3 C) 97.6 F (36.4 C) 97.9 F (36.6 C)  TempSrc: Oral Oral Oral Oral  SpO2:  97% 97% 98%    Intake/Output Summary (Last 24 hours) at 02/17/2020 1335 Last data filed at 02/17/2020 6195 Gross per 24 hour  Intake --  Output 800 ml  Net -800 ml   There were no vitals filed for this visit.  Examination:    General: Appearance:   elderly  male in no acute distress     Lungs:   respirations unlabored  Heart:    Normal heart rate. Normal rhythm.    MS:   All extremities are intact.   Neurologic:   Awake, alert, oriented x 3. Hard of hearing    Data Reviewed: I have personally reviewed following labs and imaging studies  CBC: Recent Labs  Lab 02/12/20 0547  HGB 10.7*  HCT 58.5*   Basic Metabolic Panel: Recent Labs  Lab 02/11/20 0128 02/11/20 0128 02/12/20 0547 02/12/20 0547  02/13/20 0330 02/13/20 0330 02/14/20 0234 02/14/20 0234 02/15/20 0539 02/15/20 1234 02/15/20 1839 02/16/20 0011 02/16/20 0629 02/16/20 1203 02/17/20 0414  NA 123*   < > 125*   < > 126*   < > 125*   < > 124*   < > 127* 129* 129* 129* 129*  K 4.3   < > 4.4   < > 4.7  --  4.6  --  4.0  --   --   --  4.2  --  4.0  CL 93*   < > 97*   < > 94*  --  94*  --  92*  --   --   --  97*  --  96*  CO2 24   < > 22   < > 24  --  25  --  23  --   --   --  23  --  21*  GLUCOSE 110*   < > 92   < > 99  --  91  --  88  --   --   --  97  --  92  BUN 23   < > 22   < > 27*  --  24*  --  22  --   --   --  27*  --  29*  CREATININE 1.09   < > 1.15   < > 1.20  --  1.08  --  1.04  --   --   --  1.04  --  1.12  CALCIUM 8.3*   < > 8.2*   < > 8.3*  --  8.6*  --  8.4*  --   --   --  8.8*  --  8.8*  MG 1.6*  --  1.9  --   --   --  1.6*  --  1.7  --   --   --   --   --   --   PHOS 2.7  --  2.6  --   --   --  2.9  --  2.7  --   --   --   --   --   --    < > = values in this interval not displayed.   GFR: CrCl cannot be calculated (Unknown ideal weight.). Liver Function Tests: No results for input(s): AST, ALT, ALKPHOS, BILITOT, PROT, ALBUMIN in the last 168 hours. No results for input(s): LIPASE, AMYLASE in the last 168 hours. No results for input(s): AMMONIA in the last 168 hours. Coagulation Profile: No results for input(s): INR, PROTIME in the last 168 hours. Cardiac Enzymes: No results for input(s): CKTOTAL, CKMB, CKMBINDEX, TROPONINI in the last 168 hours. BNP (last 3 results) No results for input(s): PROBNP in the last 8760 hours. HbA1C: No results for input(s): HGBA1C in the last 72 hours. CBG: Recent Labs  Lab 02/10/20 1849  GLUCAP 104*   Lipid Profile: No results for  input(s): CHOL, HDL, LDLCALC, TRIG, CHOLHDL, LDLDIRECT in the last 72 hours. Thyroid Function Tests: No results for input(s): TSH, T4TOTAL, FREET4, T3FREE, THYROIDAB in the last 72 hours. Anemia Panel: Recent Labs    02/17/20 0414   VITAMINB12 2,325*   Sepsis Labs: No results for input(s): PROCALCITON, LATICACIDVEN in the last 168 hours.  Recent Results (from the past 240 hour(s))  Respiratory Panel by RT PCR (Flu A&B, Covid) - Nasopharyngeal Swab     Status: None   Collection Time: 02/07/20  9:15 PM   Specimen: Nasopharyngeal Swab  Result Value Ref Range Status   SARS Coronavirus 2 by RT PCR NEGATIVE NEGATIVE Final    Comment: (NOTE) SARS-CoV-2 target nucleic acids are NOT DETECTED.  The SARS-CoV-2 RNA is generally detectable in upper respiratoy specimens during the acute phase of infection. The lowest concentration of SARS-CoV-2 viral copies this assay can detect is 131 copies/mL. A negative result does not preclude SARS-Cov-2 infection and should not be used as the sole basis for treatment or other patient management decisions. A negative result may occur with  improper specimen collection/handling, submission of specimen other than nasopharyngeal swab, presence of viral mutation(s) within the areas targeted by this assay, and inadequate number of viral copies (<131 copies/mL). A negative result must be combined with clinical observations, patient history, and epidemiological information. The expected result is Negative.  Fact Sheet for Patients:  PinkCheek.be  Fact Sheet for Healthcare Providers:  GravelBags.it  This test is no t yet approved or cleared by the [place] FDA and  has been authorized for detection and/or diagnosis of SARS-CoV-2 by FDA under an Emergency Use Authorization (EUA). This EUA will remain  in effect (meaning this test can be used) for the duration of the COVID-19 declaration under Section 564(b)(1) of the Act, 21 U.S.C. section 360bbb-3(b)(1), unless the authorization is terminated or revoked sooner.     Influenza A by PCR NEGATIVE NEGATIVE Final   Influenza B by PCR NEGATIVE NEGATIVE Final    Comment:  (NOTE) The Xpert Xpress SARS-CoV-2/FLU/RSV assay is intended as an aid in  the diagnosis of influenza from Nasopharyngeal swab specimens and  should not be used as a sole basis for treatment. Nasal washings and  aspirates are unacceptable for Xpert Xpress SARS-CoV-2/FLU/RSV  testing.  Fact Sheet for Patients: PinkCheek.be  Fact Sheet for Healthcare Providers: GravelBags.it  This test is not yet approved or cleared by the [place] FDA and  has been authorized for detection and/or diagnosis of SARS-CoV-2 by  FDA under an Emergency Use Authorization (EUA). This EUA will remain  in effect (meaning this test can be used) for the duration of the  Covid-19 declaration under Section 564(b)(1) of the Act, 21  U.S.C. section 360bbb-3(b)(1), unless the authorization is  terminated or revoked. Performed at [facility], [place] [address] [place], [place] [address]      Radiology Studies: No results found.  Scheduled Meds:  atomoxetine  20 mg Oral TID   dipyridamole-aspirin  1 capsule Oral BID   magnesium oxide  400 mg Oral Daily   pravastatin  20 mg Oral q1800   sodium chloride  2 g Oral TID WC   Continuous Infusions:    LOS: 8 days    Time spent: 20 mins  Geradine Girt, DO [facility]   If 7PM-7AM, please contact night-coverage

## 2020-02-17 NOTE — Progress Notes (Signed)
Occupational Therapy Treatment Patient Details Name: Adam Clements MRN: [id number] DOB: [birth year] Today's Date: 02/17/2020    History of present illness 85yo male brought to the ED after multiple episodes of syncope during which he has hit his head and lost consiciousness. EKG in NSR with known R BBB, CTH and cervical spine imaging clear of acute injury. PMH neurogenic orthostatic hypotension/autonomic failure, cardiomyopathy, supine HTN, TIA, anemia, CKD, gout, memory loss, PVD   OT comments  OT treatment session with focus on patient/family education, functional transfers, and OOB activity. Patient seated in recliner upon entry with spouse and daughter present at bedside. BP assessed at rest with reading of 128/70. Patient able to stand from recliner with with cues for hand placement and Mod A. Initial posterior bias with cues for correction. Patient them began having full body tremors prompting return to sitting with cues for hand placement and Mod A for controlled descent. BP assessed in sitting with reading of 99/68. Further ambulation deferred at this time. Patient very motivated to participate with therapy with goal of d/c to SNF rehab prior to return home. Patient would benefit from continued acute OT services to maximize safety and independence with self-care tasks and decrease caregiver burden. Recommendation for d/c to SNF rehab in prep for safe return home.    Follow Up Recommendations  SNF;Supervision/Assistance - 24 hour    Equipment Recommendations  Other (comment);3 in 1 bedside commode    Recommendations for Other Services      Precautions / Restrictions Precautions Precautions: Fall;Other (comment) Precaution Comments: autonomic failure/neurogenic orthostatic hypotension- has gone syncopal in less than 20 seconds in standing with nursing, impulsive Restrictions Weight Bearing Restrictions: No       Mobility Bed Mobility Overal bed mobility: Needs Assistance              General bed mobility comments: Patient seated in recliner upon entry.   Transfers Overall transfer level: Needs assistance Equipment used: Rolling walker (2 wheeled) Transfers: Sit to/from Stand Sit to Stand: Min assist;+2 safety/equipment         General transfer comment: Sit to stand from recliner to RW. Noted posterior bias and full body tremors in standing promting termination of task.     Balance Overall balance assessment: History of Falls;Needs assistance Sitting-balance support: No upper extremity supported;Feet supported Sitting balance-Leahy Scale: Fair Sitting balance - Comments: min guard for safety    Standing balance support: Bilateral upper extremity supported;During functional activity Standing balance-Leahy Scale: Poor Standing balance comment: reliant on external assist and BUE support. Patient with full body tremors requiring additional assist to maintain balance.                            ADL either performed or assessed with clinical judgement   ADL Overall ADL's : Needs assistance/impaired     Grooming: Minimal assistance;Sitting   Upper Body Bathing: Minimal assistance;Sitting   Lower Body Bathing: Moderate assistance;Sitting/lateral leans;Bed level   Upper Body Dressing : Minimal assistance;Sitting   Lower Body Dressing: Total assistance;Sitting/lateral leans;Bed level                       Vision       Perception     Praxis      Cognition Arousal/Alertness: Lethargic Behavior During Therapy: Flat affect Overall Cognitive Status: Impaired/Different from baseline Area of Impairment: Orientation;Attention;Memory;Following commands;Safety/judgement;Awareness;Problem solving  Orientation Level: Disoriented to;Situation Current Attention Level: Sustained Memory: Decreased short-term memory;Decreased recall of precautions Following Commands: Follows one step commands consistently;Follows  one step commands with increased time;Follows multi-step commands inconsistently Safety/Judgement: Decreased awareness of safety;Decreased awareness of deficits Awareness: Emergent Problem Solving: Slow processing;Decreased initiation;Difficulty sequencing;Requires verbal cues;Requires tactile cues General Comments: Patient able to follow 1-step verbal commands with increased time.        Exercises     Shoulder Instructions       General Comments BP 128/70 seated in recliner. Unable to obtain BP in standing 2/2 full body tremors promting patient to sit. BP in sitting 99/68. Further ambulation deferred 2/2 symptomatic hypotension.     Pertinent Vitals/ Pain       Pain Assessment: No/denies pain  Home Living                                          Prior Functioning/Environment              Frequency  Min 2X/week        Progress Toward Goals  OT Goals(current goals can now be found in the care plan section)  Progress towards OT goals: Progressing toward goals  Acute Rehab OT Goals Patient Stated Goal: To get better Time For Goal Achievement: 02/23/20 Potential to Achieve Goals: Fair ADL Goals Pt Will Perform Grooming: with modified independence;standing Pt Will Perform Lower Body Dressing: with min guard assist;sit to/from stand;sitting/lateral leans Pt Will Transfer to Toilet: with supervision;bedside commode;ambulating;stand pivot transfer Pt Will Perform Toileting - Clothing Manipulation and hygiene: with supervision;sit to/from stand;sitting/lateral leans Pt/caregiver will Perform Home Exercise Program: Increased strength;Both right and left upper extremity;With Supervision  Plan Discharge plan remains appropriate;Frequency remains appropriate    Co-evaluation                 AM-PAC OT "6 Clicks" Daily Activity     Outcome Measure   Help from another person eating meals?: A Little Help from another person taking care of personal  grooming?: A Little Help from another person toileting, which includes using toliet, bedpan, or urinal?: A Lot Help from another person bathing (including washing, rinsing, drying)?: A Lot Help from another person to put on and taking off regular upper body clothing?: A Little Help from another person to put on and taking off regular lower body clothing?: A Lot 6 Click Score: 15    End of Session Equipment Utilized During Treatment: Gait belt;Rolling walker  OT Visit Diagnosis: Other abnormalities of gait and mobility (R26.89);Muscle weakness (generalized) (M62.81);History of falling (Z91.81);Other symptoms and signs involving cognitive function;Dizziness and giddiness (R42)   Activity Tolerance Treatment limited secondary to medical complications (Comment) (hypotension)   Patient Left with call bell/phone within reach;in chair;with family/visitor present   Nurse Communication Mobility status (BP)        Time: 1245-1314 OT Time Calculation (min): 29 min  Charges: OT General Charges $OT Visit: 1 Visit OT Treatments $Therapeutic Activity: 23-37 mins  Annaleigh Steinmeyer H. OTR/L Supplemental OT, Department of rehab services [phone]   Jeshawn Melucci R H. 02/17/2020, 2:18 PM

## 2020-02-17 NOTE — Progress Notes (Signed)
Pt had a witnessed fall on the unit, MD notified and family. Pt not in any pain or discomfort post fall huddle done as well as Post fall flow sheet and safety zone.   02/17/20 1650  What Happened  Was fall witnessed? Yes  Who witnessed fall? Arisbel Maione,RN and Jonelle Sidle H,NT  Patients activity before fall other (comment) (Pt had condom Cath on just wanted to get up.)  Point of contact buttocks  Was patient injured? No  Follow Up  MD notified Rico Ala  Time MD notified 1650  Family notified Yes - comment  Time family notified 1700  Additional tests No  Simple treatment Dressing  Progress note created (see row info) Yes  Adult Fall Risk Assessment  Risk Factor Category (scoring not indicated) Fall has occurred during this admission (document High fall risk)  Age 85  Fall History: Fall within 6 months prior to admission 5  Elimination; Bowel and/or Urine Incontinence 2  Elimination; Bowel and/or Urine Urgency/Frequency 0  Medications: includes PCA/Opiates, Anti-convulsants, Anti-hypertensives, Diuretics, Hypnotics, Laxatives, Sedatives, and Psychotropics 0  Patient Care Equipment 0  Mobility-Assistance 2  Mobility-Gait 2  Mobility-Sensory Deficit 2  Altered awareness of immediate physical environment 1  Impulsiveness 2  Lack of understanding of one's physical/cognitive limitations 4  Total Score 23  Patient Fall Risk Level High fall risk  Adult Fall Risk Interventions  Required Bundle Interventions *See Row Information* High fall risk - low, moderate, and high requirements implemented  Additional Interventions Family Supervision;Room near nurses station;PT/OT need assessed if change in mobility from baseline;Use of appropriate toileting equipment (bedpan, BSC, etc.)  Screening for Fall Injury Risk (To be completed on HIGH fall risk patients) - Assessing Need for Low Bed  Risk For Fall Injury- Low Bed Criteria Admitted as a result of a fall;Previous fall this admission  Will Implement  Low Bed and Floor Mats Yes  Screening for Fall Injury Risk (To be completed on HIGH fall risk patients who do not meet crieteria for Low Bed) - Assessing Need for Floor Mats Only  Risk For Fall Injury- Criteria for Floor Mats Confusion/dementia (+NuDESC, CIWA, TBI, etc.);Noncompliant with safety precautions  Will Implement Floor Mats Yes  Vitals  Temp 98.5 F (36.9 C)  Temp Source Oral  BP (!) 186/93  MAP (mmHg) 119  BP Location Left Arm  BP Method Automatic  Patient Position (if appropriate) Lying  Pulse Rate 76  Pulse Rate Source Dinamap  Resp 19  Oxygen Therapy  SpO2 99 %  O2 Device Room Air  Pain Assessment  Pain Scale 0-10  Pain Score 0  Neurological  Neuro (WDL) X  Level of Consciousness Alert  Orientation Level Oriented to person;Oriented to place;Disoriented to situation;Disoriented to time  Ship broker  Pupil Assessment  No  Glasgow Coma Scale  Eye Opening 4  Best Verbal Response (NON-intubated) 4  Best Motor Response 6  Glasgow Coma Scale Score 14  Musculoskeletal  Musculoskeletal (WDL) X  Assistive Device Front wheel walker  Generalized Weakness Yes  Weight Bearing Restrictions No  Integumentary  Integumentary (WDL) X  Skin Color Appropriate for ethnicity  Skin Condition Dry  Skin Integrity Abrasion  Abrasion Location Head  Abrasion Location Orientation Right;Mid  Abrasion Intervention Other (Comment) (assesed)  Ecchymosis Location Arm  Ecchymosis Location Orientation Right;Left  Ecchymosis Intervention Other (Comment) (assessed)  Skin Turgor Non-tenting

## 2020-02-17 NOTE — Progress Notes (Signed)
CSW called pt daughter [name]. Daughter states she did not start appeal as she was told that the denial was not official yet. CSW called [facility] to inquire about denial. Representative informed CSW that denial was official. She stated a peer to peer review was offered but expired because no one called for peer to peer review. CSW explained that on 11/24 he was told no peer to peer offered. CSW explained he would like a peer to peer be offered despite expiration due to conflicting information given by [facility] provider line. Representative was unable to say if that would be possible but provided CSW with peer-to-peer line [phone] option 3. Ref # is [id number]. Peer to peer would be done with Dr. Mariea Clonts.

## 2020-02-17 NOTE — Plan of Care (Signed)
  Problem: Education: Goal: Knowledge of General Education information will improve Description: Including pain rating scale, medication(s)/side effects and non-pharmacologic comfort measures Outcome: Progressing   Problem: Clinical Measurements: Goal: Will remain free from infection Outcome: Progressing   

## 2020-02-18 DIAGNOSIS — R55 Syncope and collapse: Secondary | ICD-10-CM | POA: Diagnosis not present

## 2020-02-18 LAB — BASIC METABOLIC PANEL
Anion gap: 9 (ref 5–15)
Anion gap: 11 (ref 5–15)
BUN: 31 mg/dL — ABNORMAL HIGH (ref 8–23)
BUN: 28 mg/dL — ABNORMAL HIGH (ref 8–23)
CO2: 24 mmol/L (ref 22–32)
CO2: 25 mmol/L (ref 22–32)
Calcium: 8.7 mg/dL — ABNORMAL LOW (ref 8.9–10.3)
Calcium: 8.7 mg/dL — ABNORMAL LOW (ref 8.9–10.3)
Chloride: 95 mmol/L — ABNORMAL LOW (ref 98–111)
Chloride: 94 mmol/L — ABNORMAL LOW (ref 98–111)
Creatinine, Ser: 1.14 mg/dL (ref 0.61–1.24)
Creatinine, Ser: 1.16 mg/dL (ref 0.61–1.24)
GFR, Estimated: >60 mL/min (ref >60)
GFR, Estimated: >60 mL/min (ref >60)
Glucose, Bld: 102 mg/dL — ABNORMAL HIGH (ref 70–99)
Glucose, Bld: 110 mg/dL — ABNORMAL HIGH (ref 70–99)
Potassium: 4 mmol/L (ref 3.5–5.1)
Potassium: 4.3 mmol/L (ref 3.5–5.1)
Sodium: 128 mmol/L — ABNORMAL LOW (ref 135–145)
Sodium: 130 mmol/L — ABNORMAL LOW (ref 135–145)

## 2020-02-18 LAB — CBC
HCT: 29.8 % — ABNORMAL LOW (ref 39.0–52.0)
Hemoglobin: 10.3 g/dL — ABNORMAL LOW (ref 13.0–17.0)
MCH: 31.9 pg (ref 26.0–34.0)
MCHC: 34.6 g/dL (ref 30.0–36.0)
MCV: 92.3 fL (ref 80.0–100.0)
Platelets: 253 10*3/uL (ref 150–400)
RBC: 3.23 MIL/uL — ABNORMAL LOW (ref 4.22–5.81)
RDW: 11.9 % (ref 11.5–15.5)
WBC: 8.7 10*3/uL (ref 4.0–10.5)
nRBC: 0 % (ref 0.0–0.2)

## 2020-02-18 MED ORDER — MELATONIN 3 MG PO TABS
3.0000 mg | ORAL_TABLET | Freq: Every day | ORAL | Status: DC
  Administered 2020-02-18: 3 mg via ORAL
  Filled 2020-02-18: qty 1

## 2020-02-18 MED ORDER — TOLVAPTAN 15 MG PO TABS
7.5000 mg | ORAL_TABLET | Freq: Once | ORAL | Status: AC
  Administered 2020-02-18: 7.5 mg via ORAL
  Filled 2020-02-18: qty 1

## 2020-02-18 NOTE — TOC Progression Note (Addendum)
Transition of Care [facility]) - Progression Note    Patient Details  Name: Adam Clements MRN: [id number] Date of Birth: Apr 27, 1934  Transition of [facility] CM/SW [place], [place] Phone Number: 02/18/2020, 9:10 AM  Clinical Narrative: CSW contacted by daughter reporting she spoke with at [facility] respresenative who told her it needs to be expedited review opposed to p2p review. CSW received number [phone] for expedited review. CSW went through phone tree and was informed no representatives until Monday.   11:14a: CSW reached representative through their patient services opposed to provider services and initiated expedited review. Appeal # [id number]. CSW sent updated records for appeal to [facility]  Expected Discharge Plan: [place] Barriers to Discharge: Continued Medical Work up, SNF Pending bed offer  Expected Discharge Plan and Services Expected Discharge Plan: [place] arrangements for the past 2 months: Single Family Home                                       Social Determinants of Health (SDOH) Interventions    Readmission Risk Interventions No flowsheet data found.

## 2020-02-18 NOTE — Progress Notes (Signed)
Patient is now awake, alert x4, answers questions appropriately during assessment. He denies any pain or discomfort.

## 2020-02-18 NOTE — Progress Notes (Signed)
[facility] Progress Note  Subjective:   Patient agitated last night.  More delirious and difficulty sleeping.  Patient unable to answer questions today.  Review of systems: Unable to obtain due to the patient's altered mental status  Vitals:   02/17/20 1650 02/17/20 1824 02/17/20 2232 02/18/20 0745  BP: (!) 186/93 (!) 195/102 (!) 171/95 (!) 105/38  Pulse: 76 72 68 64  Resp: 19 17 18 19   Temp: 98.5 F (36.9 C) 97.8 F (36.6 C) 97.9 F (36.6 C) 98.1 F (36.7 C)  TempSrc: Oral Oral Oral Oral  SpO2: 99% 98% 94% 95%    Exam: Gen elderly thin WM; very hard of hearing     NCAT sclera anicteric Chest no increased work of breathing S1S2 no rub Abd soft ntnd  Ext no leg or UE edema  Neuro tired, arouses to questioning but answers nonsensical Psych no anxiety or agitation.   Scheduled Meds: . atomoxetine  20 mg Oral TID  . dipyridamole-aspirin  1 capsule Oral BID  . magnesium oxide  400 mg Oral Daily  . pravastatin  20 mg Oral q1800  . sodium chloride  2 g Oral TID WC  . tolvaptan  7.5 mg Oral Once   Continuous Infusions: PRN Meds:.acetaminophen **OR** acetaminophen, labetalol    Assessment/ Plan: 1. Hyponatremia: thyroid and adrenal testing wnl. No signs of volume overload UNa is high at 80 and UOsm high at 650.  Long standing issue. Unclear cause but possible idiopathic SIADH or ADH stimulation related to intermittent poor renal perfusion from autonomic dysfunction  1. Sodium slightly decreased to 128 today 2. Reduce tolvaptan 7.5 mg today 3. Recheck BMP this afternoon 4. Goal is ~130 5. Continue 2g Na tabs TID 6. No obvious need for IV hydration today 7. Could consider more fluid restriction 8. Will plan to f/u w/ Dr. Royce Macadamia 2. Orthostatic hypotension - admitted with syncope.  Sig orthostatic BP drop w/o HR increase in sitting position on 11/20.  This is a chronic condition apparently.  Note orthostatic hypotension is part of several neurologic  conditions primarily Parkinson's, also multisystem atrophy and other dementias. Amyloidosis is another entity to consider. Seen by cardiology who recommended avoiding fluorinef for now and trying salt tabs.  Note not on diuretics at home 1. Would cont abd binder/ compression stockings as in the past.    3. Agitation -mostly related to delirium. Appreciate staff and primary team efforts 4. Chronic systolic CHF with EF 61-60% - euvolemic on exam 5. Gout per primary team  6. HL per primary team  7. Memory impairment - per charting- off and on, not severe per family   Recent Labs  Lab 02/12/20 0547 02/13/20 0330 02/14/20 0234 02/14/20 0234 02/15/20 0539 02/16/20 0629 02/17/20 0414 02/18/20 0108  K 4.4   < > 4.6   < > 4.0   < > 4.0 4.0  BUN 22   < > 24*   < > 22   < > 29* 31*  CREATININE 1.15   < > 1.08   < > 1.04   < > 1.12 1.14  CALCIUM 8.2*   < > 8.6*   < > 8.4*   < > 8.8* 8.7*  PHOS 2.6  --  2.9  --  2.7  --   --   --   HGB 10.7*  --   --   --   --   --   --  10.3*   < > = values in  this interval not displayed.   Inpatient medications: . atomoxetine  20 mg Oral TID  . dipyridamole-aspirin  1 capsule Oral BID  . magnesium oxide  400 mg Oral Daily  . pravastatin  20 mg Oral q1800  . sodium chloride  2 g Oral TID WC  . tolvaptan  7.5 mg Oral Once    acetaminophen **OR** acetaminophen, labetalol   Reesa Chew, MD 02/18/2020  10:03 AM

## 2020-02-18 NOTE — Progress Notes (Signed)
Per report, pt was up all night. Patient still noted resting during bedside shift report, able to obtain VS. Daughter at bedside. Patient still noted resting quietly during rounds. Update daughter of care. Will continue to monitor.

## 2020-02-18 NOTE — Progress Notes (Addendum)
Physical Therapy Treatment Patient Details Name: Adam Clements MRN: [id number] DOB: 12/06/1934 Today's Date: 02/18/2020    History of Present Illness 85yo male brought to the ED after multiple episodes of syncope during which he has hit his head and lost consiciousness. EKG in NSR with known R BBB, CTH and cervical spine imaging clear of acute injury. PMH neurogenic orthostatic hypotension/autonomic failure, cardiomyopathy, supine HTN, TIA, anemia, CKD, gout, memory loss, PVD    PT Comments    The pt was in bed and agreeable to PT session at this time. Family states he was able to complete x3 of HEP given at last session yesterday, but has been very fatigued today. Today's session was limited by pt's lethargy and pt sx (dizzy, unable to maintain eyes open) with significant drop in BP with sitting EOB(vitals below). The pt was able to sit EOB, but required either BUE support or minA to maintain stability sitting EOB. The pt did complete a single sit-stand to clean/reposition bed linens, but again requires significant assist of 1 to maintain stability. The pt will continue to benefit from skilled PT to facilitate improved mobility, and may benefit from altering schedule of wearing ted hose and abdominal binder for mobility only to facilitate improved BP with sitting/mobility.   VITALS:  - supine - BP: 150/72; HR: 74bpm - sitting EOB - BP: 70/54; HR: 115bpm - sitting EOB after 2 min LE exercises- BP: 80/46 (58); HR: 126bpm - supine - 94/59 (72); HR: 72bpm     Follow Up Recommendations  SNF;Supervision/Assistance - 24 hour     Equipment Recommendations  Wheelchair cushion (measurements PT);Wheelchair (measurements PT);Rolling walker with 5" wheels;3in1 (PT);Hospital bed    Recommendations for Other Services       Precautions / Restrictions Precautions Precautions: Fall;Other (comment) Precaution Comments: autonomic failure/neurogenic orthostatic hypotension- has gone syncopal in less  than 20 seconds in standing with nursing, impulsive Restrictions Weight Bearing Restrictions: No    Mobility  Bed Mobility Overal bed mobility: Needs Assistance Bed Mobility: Supine to Sit;Sit to Supine     Supine to sit: Min guard Sit to supine: Min assist   General bed mobility comments: minG to come to EOB with increased time and assist for line management  Transfers Overall transfer level: Needs assistance Equipment used: 1 person hand held assist Transfers: Sit to/from Stand Sit to Stand: Min assist         General transfer comment: minA to rise from EOB, significant assist in standing due to instability. pt unable to tolerate at this time despite presence of ted hose and abdominal binder  Ambulation/Gait             General Gait Details: deferred- safety/known orthostatic drop       Balance Overall balance assessment: History of Falls;Needs assistance Sitting-balance support: No upper extremity supported;Feet supported Sitting balance-Leahy Scale: Fair Sitting balance - Comments: minG for static sitting, minA for dynamic reaching and LE movement Postural control: Posterior lean Standing balance support: Bilateral upper extremity supported;During functional activity Standing balance-Leahy Scale: Poor Standing balance comment: reliant on external assist and BUE support. Patient with full body tremors requiring additional assist to maintain balance.                             Cognition Arousal/Alertness: Lethargic Behavior During Therapy: Flat affect Overall Cognitive Status: Impaired/Different from baseline Area of Impairment: Orientation;Attention;Memory;Following commands;Safety/judgement;Awareness;Problem solving  Orientation Level: Disoriented to;Situation Current Attention Level: Focused Memory: Decreased short-term memory;Decreased recall of precautions Following Commands: Follows one step commands  consistently;Follows one step commands with increased time;Follows multi-step commands inconsistently Safety/Judgement: Decreased awareness of safety;Decreased awareness of deficits Awareness: Intellectual Problem Solving: Slow processing;Decreased initiation;Difficulty sequencing;Requires verbal cues;Requires tactile cues General Comments: lethargic but arouses with verbal stimulation, pt with eyes closed sitting EOB and head down. following simple commands with increased cues and time      Exercises General Exercises - Lower Extremity Long Arc Quad: AROM;Both;10 reps;Seated Hip Flexion/Marching: AROM;Both;10 reps;Seated Heel Raises: AROM;Both;15 reps;Seated    General Comments General comments (skin integrity, edema, etc.): BP 150/72 in supine, dropped to 70/54 and then 80/46 in sitting with HR increase from 74 to 126. Pt reported dizziness with eyes closed mostly. Pt with abdominal binder and ted hose in bed. Pt does wear ted hose at baseline, but may benefit from removal of items when supine and donning for mobility only      Pertinent Vitals/Pain Pain Assessment: Faces Faces Pain Scale: Hurts a little bit Pain Location: generalized Pain Descriptors / Indicators: Discomfort;Grimacing Pain Intervention(s): Limited activity within patient's tolerance;Monitored during session;Repositioned           PT Goals (current goals can now be found in the care plan section) Acute Rehab PT Goals Patient Stated Goal: To get better PT Goal Formulation: With patient Time For Goal Achievement: 02/23/20 Potential to Achieve Goals: Fair Progress towards PT goals: Progressing toward goals    Frequency    Min 2X/week      PT Plan Current plan remains appropriate       AM-PAC PT "6 Clicks" Mobility   Outcome Measure  Help needed turning from your back to your side while in a flat bed without using bedrails?: None Help needed moving from lying on your back to sitting on the side of a  flat bed without using bedrails?: None Help needed moving to and from a bed to a chair (including a wheelchair)?: A Lot Help needed standing up from a chair using your arms (e.g., wheelchair or bedside chair)?: A Little Help needed to walk in hospital room?: Total Help needed climbing 3-5 steps with a railing? : Total 6 Click Score: 15    End of Session Equipment Utilized During Treatment: Gait belt Activity Tolerance: Treatment limited secondary to medical complications (Comment) Patient left: in bed;with call bell/phone within reach;with bed alarm set;with family/visitor present Nurse Communication: Mobility status PT Visit Diagnosis: Unsteadiness on feet (R26.81);Muscle weakness (generalized) (M62.81);Repeated falls (R29.6);Difficulty in walking, not elsewhere classified (R26.2)     Time: 0034-9179 PT Time Calculation (min) (ACUTE ONLY): 26 min  Charges:  $Therapeutic Exercise: 8-22 mins $Therapeutic Activity: 8-22 mins                     Karma Ganja, PT, DPT   Acute Rehabilitation Department Pager #: [phone]   Otho Bellows 02/18/2020, 1:34 PM

## 2020-02-18 NOTE — Progress Notes (Signed)
PROGRESS NOTE    Adam Clements  ALP:[id number] DOB: 1934-10-26 DOA: 02/07/2020 PCP: Ria Bush, MD   Brief Narrative:  This 85 y.o.malewithhistory of neurogenic orthostatic hypotension, nonischemic cardiomyopathy last EF measured was 35 to 40% with grade 2 diastolic dysfunction, supine hypertension, TIA, chronic anemia, chronic hyponatremia, chronic kidney disease stage III was brought to the ER after patient had at least 2 episodes of syncope at home.  He was recently started on Strattera by his cardiologist for autonomic orthostatic hypotension. Patient is admitted for recurrent syncope.  MRI head unremarkable.    Patient had syncopal episodes twice during hospitalization.    Assessment & Plan:   Principal Problem:   Syncope and collapse Active Problems:   NICM (nonischemic cardiomyopathy) (HCC)   Autonomic failure   Near syncope   Syncope with history of severe autonomic orthostatic hypotension  - on Strattera outpatient -follows with Dr. Caryl Comes.   - MRI brain was done which was unremarkable.   - PT and OT recommended skilled nursing facility -Cardiology consulted recommended increasing the dose of Strattera, given worsening orthostatic hypotension.  Statterra dose increased to 3 times daily,  repeat echocardiogram showed EF 35 to 40%. Cardiology recommended continuing Strattera, and outpatient follow-up with neurology.  - Patient has another episode 11/19 when he passed out.  Neurology consulted recommended continue current management.  Orthostatic hypotension has improved but still occurring with abdominal binders and compression stockings  -continue PT evals-- patient and daughter motivated to get patient in a safe d/c situation.  I think he would benefit from short term rehab prior to d/c home with wife-- called # given x 2 for peer to peer with a recording saying they are closed - I also asked family to work on arranging 24-7 care in case insurance appeal was denied-  DME equipment ordered  REM sleep behavior disorder -trial of melatonin- will need to adjust  Hyponatremia  -TSH and cortisol normal. Nephrology consulted -getting another dose of samsca  History of TIA: Continue Aggrenox and statins.  Chronic anemia  being followed by patient's primary care physician.  Hemoglobin appears to be at baseline.  Supine hypertension  -need to allow as he has severe orthostatic hypotension   Nonischemic cardiomyopathy: -seen by cardiology   Difficult situation: will need close outpatient follow up with Dr. Caryl Comes.  Will need outpatient sleep study   DVT prophylaxis:  Lovenox Code Status: DNR Family Communication:  Spoke with daughter at bedside Disposition Plan: Status is: Inpatient  Remains inpatient appropriate because:Inpatient level of care appropriate due to severity of illness   Dispo: The patient is from: Home              Anticipated d/c is to: SNF vs home--               Anticipated d/c date is: 2 days              Patient currently is not medically stable to d/c. needs safe d/c plan and stable Na  Consultants:   Cardiology  Nephrology  neurology   Subjective: Not sure if he ever took melatonin in the past   Objective: Vitals:   02/17/20 1824 02/17/20 2232 02/18/20 0745 02/18/20 1214  BP: (!) 195/102 (!) 171/95 (!) 105/38 (!) 150/72  Pulse: 72 68 64 74  Resp: 17 18 19 16   Temp: 97.8 F (36.6 C) 97.9 F (36.6 C) 98.1 F (36.7 C) 97.8 F (36.6 C)  TempSrc: Oral Oral Oral Oral  SpO2: 98% 94% 95% 98%    Intake/Output Summary (Last 24 hours) at 02/18/2020 1416 Last data filed at 02/18/2020 1327 Gross per 24 hour  Intake 600 ml  Output 1800 ml  Net -1200 ml   There were no vitals filed for this visit.  Examination:   General: Appearance:    elderly male in no acute distress     Lungs:     Clear to auscultation bilaterally, respirations unlabored  Heart:    Normal heart rate. Normal rhythm.    MS:   All  extremities are intact.   Neurologic:   Awake, alert, hard of hearing    Data Reviewed: I have personally reviewed following labs and imaging studies  CBC: Recent Labs  Lab 02/12/20 0547 02/18/20 0108  WBC  --  8.7  HGB 10.7* 10.3*  HCT 30.1* 29.8*  MCV  --  92.3  PLT  --  631   Basic Metabolic Panel: Recent Labs  Lab 02/12/20 0547 02/13/20 0330 02/14/20 0234 02/14/20 0234 02/15/20 0539 02/15/20 1234 02/16/20 0011 02/16/20 0629 02/16/20 1203 02/17/20 0414 02/18/20 0108  NA 125*   < > 125*   < > 124*   < > 129* 129* 129* 129* 128*  K 4.4   < > 4.6  --  4.0  --   --  4.2  --  4.0 4.0  CL 97*   < > 94*  --  92*  --   --  97*  --  96* 95*  CO2 22   < > 25  --  23  --   --  23  --  21* 24  GLUCOSE 92   < > 91  --  88  --   --  97  --  92 102*  BUN 22   < > 24*  --  22  --   --  27*  --  29* 31*  CREATININE 1.15   < > 1.08  --  1.04  --   --  1.04  --  1.12 1.14  CALCIUM 8.2*   < > 8.6*  --  8.4*  --   --  8.8*  --  8.8* 8.7*  MG 1.9  --  1.6*  --  1.7  --   --   --   --   --   --   PHOS 2.6  --  2.9  --  2.7  --   --   --   --   --   --    < > = values in this interval not displayed.   GFR: CrCl cannot be calculated (Unknown ideal weight.). Liver Function Tests: No results for input(s): AST, ALT, ALKPHOS, BILITOT, PROT, ALBUMIN in the last 168 hours. No results for input(s): LIPASE, AMYLASE in the last 168 hours. No results for input(s): AMMONIA in the last 168 hours. Coagulation Profile: No results for input(s): INR, PROTIME in the last 168 hours. Cardiac Enzymes: No results for input(s): CKTOTAL, CKMB, CKMBINDEX, TROPONINI in the last 168 hours. BNP (last 3 results) No results for input(s): PROBNP in the last 8760 hours. HbA1C: No results for input(s): HGBA1C in the last 72 hours. CBG: No results for input(s): GLUCAP in the last 168 hours. Lipid Profile: No results for input(s): CHOL, HDL, LDLCALC, TRIG, CHOLHDL, LDLDIRECT in the last 72 hours. Thyroid  Function Tests: No results for input(s): TSH, T4TOTAL, FREET4, T3FREE, THYROIDAB in the last 72 hours. Anemia Panel: Recent  Labs    02/17/20 0414  VITAMINB12 2,325*   Sepsis Labs: No results for input(s): PROCALCITON, LATICACIDVEN in the last 168 hours.  No results found for this or any previous visit (from the past 240 hour(s)).   Radiology Studies: No results found.  Scheduled Meds: . atomoxetine  20 mg Oral TID  . dipyridamole-aspirin  1 capsule Oral BID  . magnesium oxide  400 mg Oral Daily  . melatonin  3 mg Oral QHS  . pravastatin  20 mg Oral q1800  . sodium chloride  2 g Oral TID WC   Continuous Infusions:    LOS: 9 days    Time spent: 20 mins  Geradine Girt, DO [facility]   If 7PM-7AM, please contact night-coverage

## 2020-02-19 DIAGNOSIS — Z66 Do not resuscitate: Secondary | ICD-10-CM

## 2020-02-19 DIAGNOSIS — R55 Syncope and collapse: Secondary | ICD-10-CM | POA: Diagnosis not present

## 2020-02-19 DIAGNOSIS — Z7189 Other specified counseling: Secondary | ICD-10-CM | POA: Diagnosis not present

## 2020-02-19 DIAGNOSIS — Z515 Encounter for palliative care: Secondary | ICD-10-CM | POA: Diagnosis not present

## 2020-02-19 LAB — RENAL FUNCTION PANEL
Albumin: 2.8 g/dL — ABNORMAL LOW (ref 3.5–5.0)
Anion gap: 7 (ref 5–15)
BUN: 22 mg/dL (ref 8–23)
CO2: 26 mmol/L (ref 22–32)
Calcium: 8.8 mg/dL — ABNORMAL LOW (ref 8.9–10.3)
Chloride: 97 mmol/L — ABNORMAL LOW (ref 98–111)
Creatinine, Ser: 1.16 mg/dL (ref 0.61–1.24)
GFR, Estimated: >60 mL/min (ref >60)
Glucose, Bld: 108 mg/dL — ABNORMAL HIGH (ref 70–99)
Phosphorus: 3.3 mg/dL (ref 2.5–4.6)
Potassium: 4.2 mmol/L (ref 3.5–5.1)
Sodium: 130 mmol/L — ABNORMAL LOW (ref 135–145)

## 2020-02-19 MED ORDER — MELATONIN 5 MG PO TABS
5.0000 mg | ORAL_TABLET | Freq: Every day | ORAL | Status: DC
  Administered 2020-02-19 – 2020-02-20 (×2): 5 mg via ORAL
  Filled 2020-02-19 (×2): qty 1

## 2020-02-19 MED ORDER — MELATONIN 5 MG PO TABS: 5.0000 mg | ORAL_TABLET | Freq: Every day | ORAL | Status: DC

## 2020-02-19 NOTE — Progress Notes (Signed)
[facility] Progress Note  Subjective:   Patient resting today without specific complaints.  Difficulty answering some questions directly.  Sodium stable at 130  Review of systems: Unable to obtain due to the patient's altered mental status  Vitals:   02/18/20 1214 02/18/20 1838 02/18/20 2355 02/19/20 0507  BP: (!) 150/72 (!) 143/80 (!) 167/93 (!) 179/92  Pulse: 74 64 68 63  Resp: 16 16 20 20   Temp: 97.8 F (36.6 C) 97.6 F (36.4 C) 98 F (36.7 C) 97.9 F (36.6 C)  TempSrc: Oral Oral    SpO2: 98% 98% 97% 93%    Exam: Gen elderly thin WM; very hard of hearing     NCAT sclera anicteric Chest no increased work of breathing S1S2 no rub Abd soft ntnd  Ext no leg or UE edema  Neuro tired, arouses to questioning, moves extremities spontaneously Psych no anxiety or agitation.   Scheduled Meds: . atomoxetine  20 mg Oral TID  . dipyridamole-aspirin  1 capsule Oral BID  . magnesium oxide  400 mg Oral Daily  . melatonin  3 mg Oral QHS  . pravastatin  20 mg Oral q1800  . sodium chloride  2 g Oral TID WC   Continuous Infusions: PRN Meds:.acetaminophen **OR** acetaminophen, labetalol    Assessment/ Plan: 1. Hyponatremia: thyroid and adrenal testing wnl. No signs of volume overload UNa is high at 80 and UOsm high at 650.  Long standing issue. Unclear cause but possible idiopathic SIADH or ADH stimulation related to intermittent poor renal perfusion from autonomic dysfunction  1. Sodium improved status post tolvaptan and on 11/27 2. Goal is ~130 3. Continue 2g Na tabs TID 4. Fluid restriction of 1.8 L daily 5. Will plan to f/u w/ Dr. Royce Macadamia 6. Continue to monitor sodium daily while inpatient 2. Orthostatic hypotension - admitted with syncope.  Sig orthostatic BP drop w/o HR increase in sitting position on 11/20.  This is a chronic condition apparently.  Note orthostatic hypotension is part of several neurologic conditions primarily Parkinson's, also multisystem  atrophy and other dementias. Amyloidosis is another entity to consider. Seen by cardiology who recommended avoiding fluorinef for now and trying salt tabs.  Note not on diuretics at home 1. Would cont abd binder/ compression stockings as in the past.    3. Agitation -mostly related to delirium. Appreciate staff and primary team efforts 4. Chronic systolic CHF with EF 62-95% - euvolemic on exam 5. Gout per primary team  6. HL per primary team  7. Memory impairment - per charting- off and on, not severe per family   Recent Labs  Lab 02/15/20 0539 02/16/20 0629 02/18/20 0108 02/18/20 0108 02/18/20 1558 02/19/20 0836  K 4.0   < > 4.0   < > 4.3 4.2  BUN 22   < > 31*   < > 28* 22  CREATININE 1.04   < > 1.14   < > 1.16 1.16  CALCIUM 8.4*   < > 8.7*   < > 8.7* 8.8*  PHOS 2.7  --   --   --   --  3.3  HGB  --   --  10.3*  --   --   --    < > = values in this interval not displayed.   Inpatient medications: . atomoxetine  20 mg Oral TID  . dipyridamole-aspirin  1 capsule Oral BID  . magnesium oxide  400 mg Oral Daily  . melatonin  3 mg Oral QHS  .  pravastatin  20 mg Oral q1800  . sodium chloride  2 g Oral TID WC    acetaminophen **OR** acetaminophen, labetalol   Reesa Chew, MD 02/19/2020  10:39 AM

## 2020-02-19 NOTE — Progress Notes (Signed)
PROGRESS NOTE    Adam Clements  XIP:[id number] DOB: Aug 06, 1934 DOA: 02/07/2020 PCP: Ria Bush, MD   Brief Narrative:  This 85 y.o.malewithhistory of neurogenic orthostatic hypotension, nonischemic cardiomyopathy last EF measured was 35 to 40% with grade 2 diastolic dysfunction, supine hypertension, TIA, chronic anemia, chronic hyponatremia, chronic kidney disease stage III was brought to the ER after patient had at least 2 episodes of syncope at home.  He was recently started on Strattera by his cardiologist for autonomic orthostatic hypotension. Patient is admitted for recurrent syncope.  MRI head unremarkable.    Patient had syncopal episodes twice during hospitalization.      Assessment & Plan:   Principal Problem:   Syncope and collapse Active Problems:   NICM (nonischemic cardiomyopathy) (HCC)   Autonomic failure   Near syncope   Syncope with history of severe autonomic orthostatic hypotension  - on Strattera outpatient -follows with Dr. Caryl Comes.   - MRI brain was done which was unremarkable.   - PT and OT recommended skilled nursing facility -Cardiology consulted recommended increasing the dose of Strattera, given worsening orthostatic hypotension.  Statterra dose increased to 3 times daily,  repeat echocardiogram showed EF 35 to 40%. Cardiology recommended continuing Strattera, and outpatient follow-up with neurology.  - Patient has another episode 11/19 when he passed out.  Neurology consulted recommended continue current management.  Orthostatic hypotension has improved but still occurring with abdominal binders and compression stockings  -continue PT evals-- patient and daughter motivated to get patient in a safe d/c situation.  I think he would benefit from short term rehab prior to d/c home with wife-- called # given x 2 for peer to peer with a recording saying they are closed - I also asked family to work on arranging 24-7 care in case insurance appeal was  denied- DME equipment ordered  REM sleep behavior disorder -trial of melatonin- will need to adjust  Hyponatremia  -TSH and cortisol normal. Nephrology consulted -getting another dose of samsca  History of TIA: Continue Aggrenox and statins.  Chronic anemia  being followed by patient's primary care physician.  Hemoglobin appears to be at baseline.  Supine hypertension  -need to allow as he has severe orthostatic hypotension   Nonischemic cardiomyopathy: -seen by cardiology   Will get palliative care consult for GOC   DVT prophylaxis:  Lovenox Code Status: DNR Family Communication:  Spoke with daughter at bedside Disposition Plan: Status is: Inpatient  Remains inpatient appropriate because:Inpatient level of care appropriate due to severity of illness   Dispo: The patient is from: Home              Anticipated d/c is to: SNF vs home--               Anticipated d/c date is: 2 days              Patient currently is not medically stable to d/c. needs safe d/c plan and stable Na  Consultants:   Cardiology  Nephrology  neurology   Subjective: Per nursing report slept better last night   Objective: Vitals:   02/18/20 1838 02/18/20 2355 02/19/20 0507 02/19/20 1254  BP: (!) 143/80 (!) 167/93 (!) 179/92 (!) 169/92  Pulse: 64 68 63 66  Resp: 16 20 20 18   Temp: 97.6 F (36.4 C) 98 F (36.7 C) 97.9 F (36.6 C) 97.8 F (36.6 C)  TempSrc: Oral   Oral  SpO2: 98% 97% 93% 100%    Intake/Output Summary (Last  24 hours) at 02/19/2020 1350 Last data filed at 02/18/2020 2103 Gross per 24 hour  Intake 360 ml  Output 700 ml  Net -340 ml   There were no vitals filed for this visit.  Examination:   General: Appearance:    elderly male in no acute distress     Lungs:      respirations unlabored  Heart:    Normal heart rate. Normal rhythm.    MS:   All extremities are intact.   Neurologic:   Hard of hearing     Data Reviewed: I have personally reviewed  following labs and imaging studies  CBC: Recent Labs  Lab 02/18/20 0108  WBC 8.7  HGB 10.3*  HCT 29.8*  MCV 92.3  PLT 119   Basic Metabolic Panel: Recent Labs  Lab 02/14/20 0234 02/14/20 0234 02/15/20 0539 02/15/20 1234 02/16/20 0629 02/16/20 0629 02/16/20 1203 02/17/20 0414 02/18/20 0108 02/18/20 1558 02/19/20 0836  NA 125*   < > 124*   < > 129*   < > 129* 129* 128* 130* 130*  K 4.6   < > 4.0  --  4.2  --   --  4.0 4.0 4.3 4.2  CL 94*   < > 92*  --  97*  --   --  96* 95* 94* 97*  CO2 25   < > 23  --  23  --   --  21* 24 25 26   GLUCOSE 91   < > 88  --  97  --   --  92 102* 110* 108*  BUN 24*   < > 22  --  27*  --   --  29* 31* 28* 22  CREATININE 1.08   < > 1.04  --  1.04  --   --  1.12 1.14 1.16 1.16  CALCIUM 8.6*   < > 8.4*  --  8.8*  --   --  8.8* 8.7* 8.7* 8.8*  MG 1.6*  --  1.7  --   --   --   --   --   --   --   --   PHOS 2.9  --  2.7  --   --   --   --   --   --   --  3.3   < > = values in this interval not displayed.   GFR: CrCl cannot be calculated (Unknown ideal weight.). Liver Function Tests: Recent Labs  Lab 02/19/20 0836  ALBUMIN 2.8*   No results for input(s): LIPASE, AMYLASE in the last 168 hours. No results for input(s): AMMONIA in the last 168 hours. Coagulation Profile: No results for input(s): INR, PROTIME in the last 168 hours. Cardiac Enzymes: No results for input(s): CKTOTAL, CKMB, CKMBINDEX, TROPONINI in the last 168 hours. BNP (last 3 results) No results for input(s): PROBNP in the last 8760 hours. HbA1C: No results for input(s): HGBA1C in the last 72 hours. CBG: No results for input(s): GLUCAP in the last 168 hours. Lipid Profile: No results for input(s): CHOL, HDL, LDLCALC, TRIG, CHOLHDL, LDLDIRECT in the last 72 hours. Thyroid Function Tests: No results for input(s): TSH, T4TOTAL, FREET4, T3FREE, THYROIDAB in the last 72 hours. Anemia Panel: Recent Labs    02/17/20 0414  VITAMINB12 2,325*   Sepsis Labs: No results for  input(s): PROCALCITON, LATICACIDVEN in the last 168 hours.  No results found for this or any previous visit (from the past 240 hour(s)).   Radiology Studies: No  results found.  Scheduled Meds: . atomoxetine  20 mg Oral TID  . dipyridamole-aspirin  1 capsule Oral BID  . magnesium oxide  400 mg Oral Daily  . melatonin  5 mg Oral QHS  . pravastatin  20 mg Oral q1800  . sodium chloride  2 g Oral TID WC   Continuous Infusions:    LOS: 10 days    Time spent: 35 mins  Geradine Girt, DO [facility]   If 7PM-7AM, please contact night-coverage

## 2020-02-19 NOTE — Consult Note (Signed)
Palliative Medicine Inpatient Consult Note  Reason for consult:  Goals of Care  HPI:  Per intake H&P --> [id number] y.o.malewithhistory of neurogenic orthostatic hypotension, nonischemic cardiomyopathy last EF measured was 35 to 40% with grade 2 diastolic dysfunction, supine hypertension, TIA, chronic anemia, chronic hyponatremia, chronic kidney disease stage III was brought to the ER after patient had at least 2 episodes of syncope at home.He was recently started on Strattera by his cardiologist for autonomic orthostatic hypotension. Patient is admitted for recurrent syncope. MRI head unremarkable.Patient had syncopal episodes twice during hospitalization.    Palliative care was asked to get involved to discuss goals of care.  Clinical Assessment/Goals of Care: I have reviewed medical records including EPIC notes, labs and imaging, received report from bedside RN, assessed the patient who is lying in bed in no distress.    I met with Laureen Abrahams and Alphonsa Overall to further discuss diagnosis prognosis, GOC, EOL wishes, disposition and options.   I introduced Palliative Medicine as specialized medical care for people living with serious illness. It focuses on providing relief from the symptoms and stress of a serious illness. The goal is to improve quality of life for both the patient and the family.  Ry is from [place] originally. His wife, [name] in from [place]. They live presently in [place] on a five acre farm - they have horses. Together they share three daughters and one son. 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 He enjoys being independent, watching [employer], fishing, and being in nature.   In regard to his functional state he was fully independent of bADL's and iADL's prior to hospitalization.   A detailed discussion was had today regarding advanced directives - On file in  [place].  Concepts specific to code status, artifical feeding and hydration, continued IV antibiotics and rehospitalization was had. I completed a MOST form today. The patient and family outlined their wishes for the following treatment decisions:  Cardiopulmonary Resuscitation: Do Not Attempt Resuscitation (DNR/No CPR)  Medical Interventions: Limited Additional Interventions: Use medical treatment, IV fluids and cardiac monitoring as indicated, DO NOT USE intubation or mechanical ventilation. May consider use of less invasive airway support such as BiPAP or CPAP. Also provide comfort measures. Transfer to the hospital if indicated. Avoid intensive care.   Antibiotics: Determine use of limitation of antibiotics when infection occurs  IV Fluids: IV fluids for a defined trial period  Feeding Tube: No feeding tube   The difference between a aggressive medical intervention path  and a palliative comfort care path for this patient at this time was had. We discussed the idea of hospice.  I described hospice as a service for patients for have a life expectancy of < 6 months. It preserves dignity and quality at the end phases of life. The focus changes from curative to symptom relief.   We talked extensively about patients struggles leading up to his hospitalization inclusive of eight falls in one day in the setting of severe autonomic dysfunction. We discussed that it's not clear how much in the setting of improvements the patient may make. Tasean' family identified that if he can make improvements that he mentally would most benefit from seeing those strides with physical and occupational therapy.   Provided information on OP Palliative care. Patients family were amenable to speaking to and learning more about this.   Patient is an exceptionally independent, stubborn  man per his family. They are having a hard time identifying him being at home and not wanting to "do everything". We discussed that there will  be the need for additional caregivers when he gets home. Discussed having our social work team provide this information to them.   Discussed the importance of continued conversation with family and their  medical providers regarding overall plan of care and treatment options, ensuring decisions are within the context of the patients values and GOCs.  Decision Maker: Stein Windhorst (spouse) [phone]  SUMMARY OF RECOMMENDATIONS   DNAR/DNI  MOST Completed, paper copy placed onto the chart electric copy can be found in [facility]  DNR Form Completed, paper copy placed onto the chart electric copy can be found in [facility]  TOC - OP Palliative support through hospice of the [facility]" (patient [facility])  Appreciate social worker reaching out to family with a list of caregiver agencies so that they may start setting up interviews  Patients family is interested in him going to SNF to see if physical improvements can be made  Code Status/Advance Care Planning: DNAR/DNI   Palliative Prophylaxis:   Oral care, mobility, delirium precuations  Additional Recommendations (Limitations, Scope, Preferences):  Continue current scope of care to treat what is treatable   Psycho-social/Spiritual:   Desire for further Chaplaincy support: No  Additional Recommendations: Education on OP Palliative support   Prognosis: Unclear - limited in the setting of neurogenic orthostatic hypotension and nonischemic cardiomyopathy last EF measured was 35 to 40% with grade 2 diastolic dysfunction  Discharge Planning: Discharge to SNF with outpatient Palliative Support  Vitals:   02/19/20 0507 02/19/20 1254  BP: (!) 179/92 (!) 169/92  Pulse: 63 66  Resp: 20 18  Temp: 97.9 F (36.6 C) 97.8 F (36.6 C)  SpO2: 93% 100%    Intake/Output Summary (Last 24 hours) at 02/19/2020 1711 Last data filed at 02/18/2020 2103 Gross per 24 hour  Intake --  Output 250 ml  Net -250 ml   Gen:   Elderly caucasian M in NAD HEENT: Dry mucous membranes CV: Regular rate and rhythm  PULM: On RA Neuro: Alert and oriented   PPS: 60%  This conversation/these recommendations were discussed with patient primary care team, Dr. Eliseo Squires  Time In: 1500 Time Out: 1700 Total Time: 120 Greater than 50%  of this time was spent counseling and coordinating care related to the above assessment and plan.  [place] Team Team Cell Phone: [phone] Please utilize secure chat with additional questions, if there is no response within 30 minutes please call the above phone number  Palliative Medicine Team providers are available by phone from 7am to 7pm daily and can be reached through the team cell phone.  Should this patient require assistance outside of these hours, please call the patient's attending physician.

## 2020-02-20 ENCOUNTER — Encounter: Payer: Self-pay | Admitting: *Deleted

## 2020-02-20 DIAGNOSIS — R55 Syncope and collapse: Secondary | ICD-10-CM | POA: Diagnosis not present

## 2020-02-20 LAB — RENAL FUNCTION PANEL
Albumin: 2.8 g/dL — ABNORMAL LOW (ref 3.5–5.0)
Anion gap: 7 (ref 5–15)
BUN: 25 mg/dL — ABNORMAL HIGH (ref 8–23)
CO2: 25 mmol/L (ref 22–32)
Calcium: 8.7 mg/dL — ABNORMAL LOW (ref 8.9–10.3)
Chloride: 96 mmol/L — ABNORMAL LOW (ref 98–111)
Creatinine, Ser: 1.17 mg/dL (ref 0.61–1.24)
GFR, Estimated: >60 mL/min (ref >60)
Glucose, Bld: 94 mg/dL (ref 70–99)
Phosphorus: 3 mg/dL (ref 2.5–4.6)
Potassium: 4.1 mmol/L (ref 3.5–5.1)
Sodium: 128 mmol/L — ABNORMAL LOW (ref 135–145)

## 2020-02-20 LAB — OSMOLALITY: Osmolality: 280 mOsm/kg (ref 275–295)

## 2020-02-20 NOTE — Care Management Important Message (Signed)
Important Message  Patient Details  Name: Adam Clements MRN: [id number] Date of Birth: 1935-03-02   Medicare Important Message Given:  Yes     Claris Guymon P Irwin 02/20/2020, 3:27 PM

## 2020-02-20 NOTE — Telephone Encounter (Signed)
Noted. Pt remains in hospital currently. Recent palliative care eval.

## 2020-02-20 NOTE — Progress Notes (Signed)
Patient ID: Adam Clements, male   DOB: 08/11/1934, 85 y.o.   MRN: [id number] S: No new complaints but did have another episode of symptomatic orthostasis this morning according to his daughter. O:BP (!) 154/79 (BP Location: Left Arm)   Pulse 70   Temp 98.7 F (37.1 C) (Oral)   Resp 16   SpO2 96%   Intake/Output Summary (Last 24 hours) at 02/20/2020 1357 Last data filed at 02/20/2020 0649 Gross per 24 hour  Intake 120 ml  Output 1250 ml  Net -1130 ml   Intake/Output: I/O last 3 completed shifts: In: 360 [P.O.:360] Out: 1500 [Urine:1500]  Intake/Output this shift:  No intake/output data recorded. Weight change:  Gen: NAD CVS: RRR no rub Resp: cta Abd: +BS, soft, NT/ND, abdominal binder in place Ext: no edema, compression stockings in place.  Recent Labs  Lab 02/14/20 0234 02/14/20 0234 02/15/20 0539 02/15/20 1234 02/16/20 0629 02/16/20 1203 02/17/20 0414 02/18/20 0108 02/18/20 1558 02/19/20 0836 02/20/20 0507  NA 125*   < > 124*   < > 129* 129* 129* 128* 130* 130* 128*  K 4.6   < > 4.0  --  4.2  --  4.0 4.0 4.3 4.2 4.1  CL 94*   < > 92*  --  97*  --  96* 95* 94* 97* 96*  CO2 25   < > 23  --  23  --  21* 24 25 26 25   GLUCOSE 91   < > 88  --  97  --  92 102* 110* 108* 94  BUN 24*   < > 22  --  27*  --  29* 31* 28* 22 25*  CREATININE 1.08   < > 1.04  --  1.04  --  1.12 1.14 1.16 1.16 1.17  ALBUMIN  --   --   --   --   --   --   --   --   --  2.8* 2.8*  CALCIUM 8.6*   < > 8.4*  --  8.8*  --  8.8* 8.7* 8.7* 8.8* 8.7*  PHOS 2.9  --  2.7  --   --   --   --   --   --  3.3 3.0   < > = values in this interval not displayed.   Liver Function Tests: Recent Labs  Lab 02/19/20 0836 02/20/20 0507  ALBUMIN 2.8* 2.8*   No results for input(s): LIPASE, AMYLASE in the last 168 hours. No results for input(s): AMMONIA in the last 168 hours. CBC: Recent Labs  Lab 02/18/20 0108  WBC 8.7  HGB 10.3*  HCT 29.8*  MCV 92.3  PLT 253   Cardiac Enzymes: No results for  input(s): CKTOTAL, CKMB, CKMBINDEX, TROPONINI in the last 168 hours. CBG: No results for input(s): GLUCAP in the last 168 hours.  Iron Studies: No results for input(s): IRON, TIBC, TRANSFERRIN, FERRITIN in the last 72 hours. Studies/Results: No results found. Marland Kitchen atomoxetine  20 mg Oral TID  . dipyridamole-aspirin  1 capsule Oral BID  . magnesium oxide  400 mg Oral Daily  . melatonin  5 mg Oral QHS  . pravastatin  20 mg Oral q1800  . sodium chloride  2 g Oral TID WC    BMET    Component Value Date/Time   NA 128 (L) 02/20/2020 0507   K 4.1 02/20/2020 0507   CL 96 (L) 02/20/2020 0507   CO2 25 02/20/2020 0507   GLUCOSE 94 02/20/2020 0507  BUN 25 (H) 02/20/2020 0507   CREATININE 1.17 02/20/2020 0507   CREATININE 1.26 (H) 08/24/2019 1032   CREATININE 1.25 (H) 10/25/2015 0959   CALCIUM 8.7 (L) 02/20/2020 0507   GFRNONAA >60 02/20/2020 0507   GFRNONAA 52 (L) 08/24/2019 1032   GFRAA >60 08/24/2019 1032   CBC    Component Value Date/Time   WBC 8.7 02/18/2020 0108   RBC 3.23 (L) 02/18/2020 0108   HGB 10.3 (L) 02/18/2020 0108   HGB 10.4 (L) 08/24/2019 1032   HGB 9.6 (L) 02/22/2018 1606   HCT 29.8 (L) 02/18/2020 0108   HCT 29.2 (L) 02/22/2018 1606   PLT 253 02/18/2020 0108   PLT 204 08/24/2019 1032   PLT 254 02/22/2018 1606   MCV 92.3 02/18/2020 0108   MCV 96 02/22/2018 1606   MCH 31.9 02/18/2020 0108   MCHC 34.6 02/18/2020 0108   RDW 11.9 02/18/2020 0108   RDW 12.0 (L) 02/22/2018 1606   LYMPHSABS 0.7 02/07/2020 2049   MONOABS 0.6 02/07/2020 2049   EOSABS 0.1 02/07/2020 2049   BASOSABS 0.0 02/07/2020 2049     Assessment/Plan:  1. Acute on chronic hyponatremia- baseline sodium levels appear to range from 125-130 over the past 2 years.  TSH and cortisol WNL.  euvolemic on exam.  Urine sodium inappropriately high at 80 and Uosm 650 consistent with SIADH, however he also has symptomatic orthostatic hypotension and is on strattera which has been associated with hyponatremia.   Sodium has ranged 128-130 for the past 4 days and no history of N/V or HA.   1. Continue with fluid restriction and sodium chloride tabs 2. Hold off on another dose of tolvaptan (received 7.5 mg on 11/24 and again on 02/18/20).  3. Continue to follow and ok if he remains at 128-130. 4. Will repeat urine studies today 2. Syncope with history of Neurogenic orthostatic hypotension- on straterra as an outpatient and follows with Dr. Caryl Comes. 3. Nonischemic CMP 4. Grade 2 diastolic dysfunction 5. CKD stage IIIa- stable  Donetta Potts, MD [facility] [phone]

## 2020-02-20 NOTE — Consult Note (Signed)
ELECTROPHYSIOLOGY CONSULT NOTE  Patient ID: Adam Clements, MRN: [id number], DOB/AGE: 04-08-1934 85 y.o. Admit date: 02/07/2020 Date of Consult: 02/20/2020  Primary Physician: Ria Bush, MD Primary Cardiologist: Ignacia Bayley Adam Clements is a 85 y.o. male who is being seen today for the evaluation of ORTHOSTATIC hypotension at the request of Dr Eliseo Squires.   Chief Complaint: autonomic failure   HPI Adam Clements is a 85 y.o. male  With a diagnosis of autonomic failure manifested primarily by profound orthostatic hypotension and recurrent syncope.  He was admitted on this occasion because of marked increase in syncopal events, 8 the day of his admission.  There has been some changes in cognitive function  Palliative care consulted with plans for rehab  He has been tried on multiple medications including ProAmatine, pyridostigmine, Northera, and most recently Strattera.  He has had adjunctive therapy with abdominal binders, thigh sleeves and reverse Trendelenburg.  He has persisted amazingly well and undertaking the efforts of caring for his horses but over recent months has found himself with impaired exercise tolerance which has been attributed in large part to hypotension.   Previously evaluated for   amyloid but have found little>> borderline PYP ; myeloma panel>>M prot 0.3  FLC assay neg    IFE urine neg  Discussed with Dr Gaylan Gerold  Hx of TIA on aggrostat.     DATE TEST EF   6/17 MYOVIEW  36 %   4/17 Echo   45-50% LAE severe  10/19 HUT     8/19 PYP  Ratio 1.4  11/21 Echo  35-40% LVH mod 1.5cm    Past Medical History:  Diagnosis Date  . Allergic rhinitis   . Arthritis    Gout  . BPH (benign prostatic hypertrophy)   . Bradycardia   . CAD (coronary artery disease)    nonobstructive  . Chronic anemia 2013   h/o IDA, did not tolerate oral iron, latest normal iron studies, B12, folate  . Diverticulosis of colon    conoscopy 12/1993 (Dr. Amedeo Plenty) S/G  divertics//colonoscopy L&R divertics (Dr. Amedeo Plenty) 11/08/2004  . Dizziness   . Gout   . HTN (hypertension)   . Memory loss   . Mitral regurgitation    Pulmonic regurgitation, with marked LA enlargement  . Nonischemic cardiomyopathy (HCC)    EF 40%  . Orthostatic hypotension   . PVC (premature ventricular contraction)   . PVD (peripheral vascular disease) ([place])    followed by Dr Trula Slade, treating medically (pletal and aspirin)  . RBBB longstanding  . Squamous cell carcinoma in situ of skin 2015   L ear  . Syncope       Surgical History:  Past Surgical History:  Procedure Laterality Date  . 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 APPENDECTOMY    . dexa  2004   "shrunk 2 inches", WNL     Home Meds: Prior to Admission medications   Medication Sig Start Date End Date Taking? Authorizing Provider  atomoxetine (STRATTERA) 10 MG capsule Take 2 capsules (20 mg total) by mouth 2 (two) times daily. 02/02/20  Yes Deboraha Sprang, MD  cholecalciferol (VITAMIN D) 1000 units tablet Take 1,000 Units by mouth daily.   Yes [provider]  dipyridamole-aspirin (AGGRENOX) 200-25 MG 12hr capsule TAKE 1 CAPSULE BY MOUTH TWICE A DAY Patient taking differently: Take 1 capsule by mouth 2 (two) times daily.  10/13/19  Yes Ria Bush, MD  pravastatin (PRAVACHOL) 20 MG tablet TAKE 1 TABLET(20  MG) BY MOUTH DAILY Patient taking differently: Take 20 mg by mouth daily.  11/15/19  Yes Ria Bush, MD  vitamin B-12 (CYANOCOBALAMIN) 500 MCG tablet Take 1 tablet (500 mcg total) by mouth every Monday, Wednesday, and Friday. 01/11/20  Yes Ria Bush, MD    Inpatient Medications:  . atomoxetine  20 mg Oral TID  . dipyridamole-aspirin  1 capsule Oral BID  . magnesium oxide  400 mg Oral Daily  . melatonin  5 mg Oral QHS  . pravastatin  20 mg Oral q1800  . sodium chloride  2 g Oral TID WC      Allergies:  Allergies  Allergen Reactions  . Atorvastatin Other (See Comments)     Memory trouble, confusion, fatigue  . Pletal [Cilostazol]     Social History   Socioeconomic History  . Marital status: Married    Spouse name: Not on file  . Number of children: Not on file  . Years of education: Not on file  . Highest education level: Not on file  Occupational History  . Not on file  Tobacco Use  . Smoking status: Former Smoker    Packs/day: 2.00    Types: Cigarettes    Quit date: 11/10/1982    Years since quitting: 37.3  . Smokeless tobacco: Former Network engineer  . Vaping Use: Never used  Substance and Sexual Activity  . Alcohol use: Yes    Alcohol/week: 7.0 standard drinks    Types: 7 Glasses of wine per week    Comment: 1 glass of wine daily. History of heavy drinking prior to 2005.  . Drug use: No  . Sexual activity: Never  Other Topics Concern  . Not on file  Social History Narrative   Lives with wife [name]). Dog passed away 12-06-12.   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 HCPOA is wife then son and then oldest daughter etc. Advanced directives in chart Dec 06, 2012). Does not want artificial nutrition/hydration or prolonged life support      Does have stairs in home. BS Degree   Social Determinants of Health   Financial Resource Strain:   . Difficulty of Paying Living Expenses: Not on file  Food Insecurity:   . Worried About Charity fundraiser in the Last Year: Not on file  . Ran Out of Food in the Last Year: Not on file  Transportation Needs:   . Lack of Transportation (Medical): Not on file  . Lack of Transportation (Non-Medical): Not on file  Physical Activity:   . Days of Exercise per Week: Not on file  . Minutes of Exercise per Session: Not on file  Stress:   . Feeling of Stress : Not on  file  Social Connections:   . Frequency of Communication with Friends and Family: Not on file  . Frequency of Social Gatherings with Friends and Family: Not on file  . Attends Religious Services: Not on file  . Active Member of Clubs or Organizations: Not on file  . Attends Archivist Meetings: Not on file  . Marital Status: Not on file  Intimate Partner Violence:   . Fear of Current or Ex-Partner: Not on file  . Emotionally Abused: Not on file  . Physically Abused: Not  on file  . Sexually Abused: Not on file     Family History  Problem Relation Age of Onset  . Heart disease Mother 26       Natural causes with CHF  . Hypertension Mother   . Hypertension Father   . Stroke Father        cerebral hemorrhage  . Hyperlipidemia Brother   . Heart disease Brother        before age 19  . Hypertension Brother   . Heart attack Brother   . Hypertension Other      ROS:  Please see the history of present illness.     All other systems reviewed and negative.    Physical Exam: Blood pressure (!) 154/79, pulse 70, temperature 98.7 F (37.1 C), temperature source Oral, resp. rate 16, SpO2 96 %. General: Well developed, well nourished male sleeping with mouth open in no acute distress. Head: Normocephalic, atraumatic, sclera non-icteric, no xanthomas, nares are without discharge. EENT: normal Lymph Nodes:  none Back: without scoliosis/kyphosis, no CVA tendersness Neck: Negative for carotid bruits. JVD not elevated. Lungs: Clear bilaterally to auscultation without wheezes, rales, or rhonchi. Breathing is unlabored. Heart: RRR with S1 S2. No  murmur , rubs, or gallops appreciated. Abdomen: Soft, non-tender, non-distended with normoactive bowel sounds. No hepatomegaly. No rebound/guarding. No obvious abdominal masses. Msk:  Strength and tone appear normal for age. Extremities: No clubbing or cyanosis. No  edema.  Distal pedal pulses are 2+ and equal bilaterally. Skin: Warm and  Dry Neuro: Alert and oriented X 2 2022.  Does not remember the name of the president and cannot pick the president's name out of a series of 3.. CN III-XII intact Grossly normal sensory and motor function patient in bed with bed rest.  Speech is somewhat slurred Psych:  Responds to questions appropriately with a normal affect      Labs: Cardiac Enzymes No results for input(s): CKTOTAL, CKMB, TROPONINI in the last 72 hours. CBC Lab Results  Component Value Date   WBC 8.7 02/18/2020   HGB 10.3 (L) 02/18/2020   HCT 29.8 (L) 02/18/2020   MCV 92.3 02/18/2020   PLT 253 02/18/2020   PROTIME: No results for input(s): LABPROT, INR in the last 72 hours. Chemistry  Recent Labs  Lab 02/20/20 0507  NA 128*  K 4.1  CL 96*  CO2 25  BUN 25*  CREATININE 1.17  CALCIUM 8.7*  GLUCOSE 94   Lipids Lab Results  Component Value Date   CHOL 138 09/27/2019   HDL 60.70 09/27/2019   LDLCALC 72 09/27/2019   TRIG 28.0 09/27/2019   BNP No results found for: PROBNP Thyroid Function Tests: No results for input(s): TSH, T4TOTAL, T3FREE, THYROIDAB in the last 72 hours.  Invalid input(s): FREET3    Miscellaneous No results found for: DDIMER  Radiology/Studies:  DG Chest 2 View  Result Date: 02/07/2020 CLINICAL DATA:  Multiple falls EXAM: CHEST - 2 VIEW COMPARISON:  2017 FINDINGS: Increased top-normal heart size. Chronic interstitial prominence. No pleural effusion. No acute osseous abnormality. IMPRESSION: Chronic interstitial changes.  Increased top-normal heart size. Electronically Signed   By: Macy Mis M.D.   On: 02/07/2020 21:47   DG Abd 1 View  Result Date: 02/08/2020 CLINICAL DATA:  MRI clearance EXAM: ABDOMEN - 1 VIEW COMPARISON:  None. FINDINGS: Normal abdominal gas pattern. Several surgical clips are noted within the right lower quadrant. No unexpected metallic foreign body within the abdomen. Vascular calcifications are seen within the  pelvis. Degenerative changes are seen  within the lumbar spine. IMPRESSION: No unexpected metallic foreign body within the abdomen. Electronically Signed   By: Fidela Salisbury MD   On: 02/08/2020 05:00   CT Head Wo Contrast  Result Date: 02/07/2020 CLINICAL DATA:  Multiple falls, orthostatic, normocytic anemia EXAM: CT HEAD WITHOUT CONTRAST CT CERVICAL SPINE WITHOUT CONTRAST TECHNIQUE: Multidetector CT imaging of the head and cervical spine was performed following the standard protocol without intravenous contrast. Multiplanar CT image reconstructions of the cervical spine were also generated. COMPARISON:  CT 08/26/2017, MRI 02/22/2016 FINDINGS: CT HEAD FINDINGS Brain: Scattered hypoattenuating foci in the bilateral basal ganglia and external capsule likely reflect areas of remote lacunar type infarcts. No evidence of acute infarction, hemorrhage, hydrocephalus, extra-axial collection, visible mass lesion or mass effect. Symmetric prominence of the ventricles, cisterns and sulci compatible with parenchymal volume loss. Patchy areas of white matter hypoattenuation are most compatible with chronic microvascular angiopathy. Vascular: Atherosclerotic calcification of the carotid siphons and intradural vertebral arteries. No hyperdense vessel. Skull: Some mild right temporoparietal and left temporal scalp thickening. Additional thickening and possible laceration of the left frontal scalp as well. No calvarial fracture or acute osseous injury is identified. Sinuses/Orbits: Paranasal sinuses and mastoid air cells are predominantly clear. Orbital structures are unremarkable aside from prior lens extractions. Other: None. CT CERVICAL SPINE FINDINGS Alignment: Cervical stabilization collar is in place at the time of examination. Mild straightening of normal cervical lordosis, possibly positional, degenerative or related to muscle spasm. No evidence of traumatic listhesis. No abnormally widened, perched or jumped facets. Normal alignment of the craniocervical  and atlantoaxial articulations. Skull base and vertebrae: No acute skull base fracture. No vertebral body fracture or height loss. Normal bone mineralization. No worrisome osseous lesions. Moderate arthrosis at the atlantodental interval. Cervical spondylitic changes, detailed below. Soft tissues and spinal canal: No pre or paravertebral fluid or swelling. No visible canal hematoma. Disc levels: Multilevel intervertebral disc height loss with spondylitic endplate changes. Multilevel disc osteophyte complexes efface the ventral thecal sac without significant resulting spinal canal stenosis. Multilevel uncinate spurring and facet degenerative changes which are most pronounced on the left at C3-4 and on the right at C2-3 where more moderate foraminal impingement is present. More mild multilevel foraminal narrowing is seen throughout the remaining cervical levels. Upper chest: Extensive biapical pleuroparenchymal scarring is noted with some apical emphysematous changes as well. Cervical carotid atherosclerosis is present. Other: No concerning thyroid nodules or masses. IMPRESSION: 1. No acute intracranial abnormality. 2. Some mild right temporoparietal and left temporal scalp thickening. Additional thickening and possible laceration of the left frontal scalp as well. No calvarial fracture or acute osseous injury is identified. 3. Chronic microvascular ischemic changes, parenchymal volume loss and intracranial atherosclerosis. 4. Punctate hypoattenuating foci in the bilateral basal ganglia and external capsules compatible with prior lacunar infarcts. 5. No evidence of acute fracture or traumatic listhesis of the cervical spine. 6. Cervical spondylitic and facet degenerative changes, as above. 7. Cervical and intracranial atherosclerosis. Electronically Signed   By: Lovena Le M.D.   On: 02/07/2020 22:05   CT Cervical Spine Wo Contrast  Result Date: 02/07/2020 CLINICAL DATA:  Multiple falls, orthostatic, normocytic  anemia EXAM: CT HEAD WITHOUT CONTRAST CT CERVICAL SPINE WITHOUT CONTRAST TECHNIQUE: Multidetector CT imaging of the head and cervical spine was performed following the standard protocol without intravenous contrast. Multiplanar CT image reconstructions of the cervical spine were also generated. COMPARISON:  CT 08/26/2017, MRI 02/22/2016 FINDINGS: CT HEAD FINDINGS Brain:  Scattered hypoattenuating foci in the bilateral basal ganglia and external capsule likely reflect areas of remote lacunar type infarcts. No evidence of acute infarction, hemorrhage, hydrocephalus, extra-axial collection, visible mass lesion or mass effect. Symmetric prominence of the ventricles, cisterns and sulci compatible with parenchymal volume loss. Patchy areas of white matter hypoattenuation are most compatible with chronic microvascular angiopathy. Vascular: Atherosclerotic calcification of the carotid siphons and intradural vertebral arteries. No hyperdense vessel. Skull: Some mild right temporoparietal and left temporal scalp thickening. Additional thickening and possible laceration of the left frontal scalp as well. No calvarial fracture or acute osseous injury is identified. Sinuses/Orbits: Paranasal sinuses and mastoid air cells are predominantly clear. Orbital structures are unremarkable aside from prior lens extractions. Other: None. CT CERVICAL SPINE FINDINGS Alignment: Cervical stabilization collar is in place at the time of examination. Mild straightening of normal cervical lordosis, possibly positional, degenerative or related to muscle spasm. No evidence of traumatic listhesis. No abnormally widened, perched or jumped facets. Normal alignment of the craniocervical and atlantoaxial articulations. Skull base and vertebrae: No acute skull base fracture. No vertebral body fracture or height loss. Normal bone mineralization. No worrisome osseous lesions. Moderate arthrosis at the atlantodental interval. Cervical spondylitic changes,  detailed below. Soft tissues and spinal canal: No pre or paravertebral fluid or swelling. No visible canal hematoma. Disc levels: Multilevel intervertebral disc height loss with spondylitic endplate changes. Multilevel disc osteophyte complexes efface the ventral thecal sac without significant resulting spinal canal stenosis. Multilevel uncinate spurring and facet degenerative changes which are most pronounced on the left at C3-4 and on the right at C2-3 where more moderate foraminal impingement is present. More mild multilevel foraminal narrowing is seen throughout the remaining cervical levels. Upper chest: Extensive biapical pleuroparenchymal scarring is noted with some apical emphysematous changes as well. Cervical carotid atherosclerosis is present. Other: No concerning thyroid nodules or masses. IMPRESSION: 1. No acute intracranial abnormality. 2. Some mild right temporoparietal and left temporal scalp thickening. Additional thickening and possible laceration of the left frontal scalp as well. No calvarial fracture or acute osseous injury is identified. 3. Chronic microvascular ischemic changes, parenchymal volume loss and intracranial atherosclerosis. 4. Punctate hypoattenuating foci in the bilateral basal ganglia and external capsules compatible with prior lacunar infarcts. 5. No evidence of acute fracture or traumatic listhesis of the cervical spine. 6. Cervical spondylitic and facet degenerative changes, as above. 7. Cervical and intracranial atherosclerosis. Electronically Signed   By: Lovena Le M.D.   On: 02/07/2020 22:05   MR BRAIN WO CONTRAST  Result Date: 02/08/2020 CLINICAL DATA:  Neuro deficit with acute stroke suspected. EXAM: MRI HEAD WITHOUT CONTRAST TECHNIQUE: Multiplanar, multiecho pulse sequences of the brain and surrounding structures were obtained without intravenous contrast. COMPARISON:  Head CT from yesterday FINDINGS: Brain: Prominent generalized cortical atrophy. No acute  infarct, hemorrhage, hydrocephalus, or collection. No masslike finding. Mild chronic white matter disease for age. Vascular: Preserved flow voids Skull and upper cervical spine: No focal marrow lesion. Facet osteoarthritis with notable spurring on the right at C2-3. Sinuses/Orbits: Bilateral cataract resection. Opacified left frontal sinus without fluid level IMPRESSION: 1. No acute or reversible finding. 2. Generalized cortical atrophy. Electronically Signed   By: Monte Fantasia M.D.   On: 02/08/2020 06:32   ECHOCARDIOGRAM COMPLETE  Result Date: 02/08/2020    ECHOCARDIOGRAM REPORT   Patient Name:   MAXIMILLIANO KERSH Date of Exam: 02/08/2020 Medical Rec #:  [id number]       Height:  67.2 in Accession #:    [id number]      Weight:       145.6 lb Date of Birth:  1934/09/20        BSA:          1.771 m Patient Age:    39 years        BP:           151/94 mmHg Patient Gender: M               HR:           65 bpm. Exam Location:  Inpatient Procedure: 2D Echo, Cardiac Doppler and Color Doppler Indications:    Syncope  History:        Patient has prior history of Echocardiogram examinations, most                 recent 10/19/2017. Mitral Valve Disease, Arrythmias:RBBB and PVC,                 Signs/Symptoms:Syncope; Risk Factors:Hypertension and Former                 Smoker. Non-ischemic cardiomyopathy, HFrEF, PAD.  Sonographer:    Clayton Lefort RDCS (AE) Referring Phys: [place] Comments: Suboptimal subcostal window. IMPRESSIONS  1. Left ventricular ejection fraction, by estimation, is 35 to 40%. The left ventricle has moderately decreased function. The left ventricle demonstrates global hypokinesis. There is severe concentric left ventricular hypertrophy. Left ventricular diastolic parameters are indeterminate.  2. Right ventricular systolic function is normal. The right ventricular size is normal.  3. The mitral valve is grossly normal. Trivial mitral valve regurgitation.  4. The aortic valve  was not well visualized. Aortic valve regurgitation is not visualized. No aortic stenosis is present.  5. Aortic dilatation noted. There is mild dilatation of the aortic root, measuring 40 mm. There is borderline dilatation of the ascending aorta, measuring 37 mm. Comparison(s): A prior study was performed on 10/20/19. Compared to prior: similar LV function and stroke volume. Slight dilation of the aortic root. Calcifed papillary muscle stable from 2019 study. FINDINGS  Left Ventricle: Left ventricular ejection fraction, by estimation, is 35 to 40%. The left ventricle has moderately decreased function. The left ventricle demonstrates global hypokinesis. The left ventricular internal cavity size was normal in size. There is severe concentric left ventricular hypertrophy. Left ventricular diastolic parameters are indeterminate. Right Ventricle: The right ventricular size is normal. No increase in right ventricular wall thickness. Right ventricular systolic function is normal. Left Atrium: Left atrial size was normal in size. Right Atrium: Right atrial size was normal in size. Pericardium: There is no evidence of pericardial effusion. Mitral Valve: The mitral valve is grossly normal. Mild mitral annular calcification. Trivial mitral valve regurgitation. Tricuspid Valve: The tricuspid valve is normal in structure. Tricuspid valve regurgitation is not demonstrated. Aortic Valve: The aortic valve was not well visualized. Aortic valve regurgitation is not visualized. No aortic stenosis is present. Aortic valve mean gradient measures 3.0 mmHg. Aortic valve peak gradient measures 5.8 mmHg. Aortic valve area, by VTI measures 1.83 cm. Pulmonic Valve: The pulmonic valve was not well visualized. Pulmonic valve regurgitation is not visualized. Aorta: Aortic dilatation noted. There is mild dilatation of the aortic root, measuring 40 mm. There is borderline dilatation of the ascending aorta, measuring 37 mm. IAS/Shunts: The atrial  septum is grossly normal.  LEFT VENTRICLE PLAX 2D LVIDd:  4.70 cm LVIDs:         4.30 cm LV PW:         1.50 cm LV IVS:        1.60 cm LVOT diam:     1.90 cm LV SV:         42 LV SV Index:   24 LVOT Area:     2.84 cm  LV Volumes (MOD) LV vol d, MOD A2C: 116.0 ml LV vol d, MOD A4C: 154.0 ml LV vol s, MOD A2C: 63.3 ml LV vol s, MOD A4C: 107.0 ml LV SV MOD A2C:     52.7 ml LV SV MOD A4C:     154.0 ml LV SV MOD BP:      52.6 ml RIGHT VENTRICLE RV Basal diam:  3.50 cm RV Mid diam:    2.40 cm RV S prime:     10.60 cm/s TAPSE (M-mode): 2.2 cm LEFT ATRIUM           Index       RIGHT ATRIUM           Index LA diam:      3.80 cm 2.15 cm/m  RA Area:     17.60 cm LA Vol (A2C): 47.2 ml 26.65 ml/m RA Volume:   50.60 ml  28.57 ml/m LA Vol (A4C): 55.7 ml 31.45 ml/m  AORTIC VALVE AV Area (Vmax):    1.58 cm AV Area (Vmean):   1.47 cm AV Area (VTI):     1.83 cm AV Vmax:           120.00 cm/s AV Vmean:          74.700 cm/s AV VTI:            0.228 m AV Peak Grad:      5.8 mmHg AV Mean Grad:      3.0 mmHg LVOT Vmax:         66.70 cm/s LVOT Vmean:        38.600 cm/s LVOT VTI:          0.147 m LVOT/AV VTI ratio: 0.64  AORTA Ao Root diam: 4.00 cm Ao Asc diam:  3.70 cm  SHUNTS Systemic VTI:  0.15 m Systemic Diam: 1.90 cm Rudean Haskell MD Electronically signed by Rudean Haskell MD Signature Date/Time: 02/08/2020/5:00:16 PM    Final     EKG    Assessment and Plan:  Autonomic failure-progressive manifested by orthostatic hypotension recurrent syncope  Hypertension-systolic  Cardiomyopathy-nonischemic  TIA  Hyponatremia question SIADH  Confusion spells/variable mental status  End-of-life discussion   The patient has had progressive autonomic failure with worsening orthostatic hypotension and recurrent syncope.  We have tried multiple medications to try to mitigate as well as compressive wear of thighs and abdomen largely to progressively less avail.  Cognitive dysfunction is supervening.  The  bed rest of the last 10 days will be very hard to overcome even with rehabilitation and I do not suspect that he will be safely ambulatory going forward despite best efforts.  Nonpharmacological interventions including reverse Trendelenburg, minimizing bed rest and ongoing compressive wear in addition to Strattera are our options but as noted, I suspect will be insufficient to allow for safe standing.  In this regard, I discussed with the patient and his wife on the phone and his daughter in the room goals of care.  At this juncture, his autonomic failure continues to progress to the point that it becomes potentially life threatening  and life ending.  Been nonambulatory raises the question as to where he and his family would like his last days/weeks/months to be spent.  Furthermore, his prognosis is I think sufficiently worsening that hospice would be appropriately considered.  I have mentioned this to his wife as well is in conversations with Dr. Eliseo Squires.   I have also discussed end-of-life issues with the patient, his wife and daughter, noting that his end-of-life is coming, that while he is able cognitively and physically, he can consider what his dying well would look like and with his family's help see to accomplish this.      Virl Axe

## 2020-02-20 NOTE — Progress Notes (Signed)
Palliative Medicine RN Note: Notified Cheri with [facility] of referral.  Marjie Skiff. Amberlyn Martinezgarcia, RN, BSN, [facility] Palliative Medicine Team 02/20/2020 2:19 PM Office [phone]

## 2020-02-20 NOTE — Progress Notes (Incomplete)
   Rec'd referral today from d/c planner while pt is currently an inpatient at [facility].  Call to wife [name] to discuss the [facility].  According to [name], pt will be going to a SNF for rehab upon d/c.  Will follow pt's rehab course with plan to schedule intake visit when pt transitions back home. Please call [facility] if we can be of any assistance.  Wynetta Fines, RN

## 2020-02-20 NOTE — Progress Notes (Signed)
PROGRESS NOTE    Adam Clements  EXN:[id number] DOB: 12/24/1934 DOA: 02/07/2020 PCP: Ria Bush, MD   Brief Narrative:  This 85 y.o.malewithhistory of neurogenic orthostatic hypotension, nonischemic cardiomyopathy last EF measured was 35 to 40% with grade 2 diastolic dysfunction, supine hypertension, TIA, chronic anemia, chronic hyponatremia, chronic kidney disease stage III was brought to the ER after patient had at least 2 episodes of syncope at home.  He was recently started on Strattera by his cardiologist for autonomic orthostatic hypotension. Patient is admitted for recurrent syncope.  MRI head unremarkable.    Patient had syncopal episodes twice during hospitalization.   Difficult situation, do not think patient will fully recover to his prior functional status.  Appreciated Dr. Caryl Comes seeing patient.  Will continue to work with palliative care and family for safe d/c plan.     Assessment & Plan:   Principal Problem:   Syncope and collapse Active Problems:   NICM (nonischemic cardiomyopathy) (HCC)   Goals of care, counseling/discussion   Autonomic failure   Near syncope   Palliative care by specialist   DNR (do not resuscitate)   Syncope with history of severe autonomic orthostatic hypotension  - on Strattera outpatient -follows with Dr. Caryl Comes- appreciated his consult - MRI brain was done which was unremarkable.   - PT and OT recommended skilled nursing facility -Cardiology consulted recommended increasing the dose of Strattera, given worsening orthostatic hypotension.  Statterra dose increased to 3 times daily,  repeat echocardiogram showed EF 35 to 40%. Cardiology recommended continuing Strattera, and outpatient follow-up with neurology.  - Patient has another episode 11/19 when he passed out.  Neurology consulted recommended continue current management.  Orthostatic hypotension has improved but still occurring with abdominal binders and compression stockings  -bed  in reverse trendelenberg  REM sleep behavior disorder? -trial of melatonin- will need to adjust -seems to be sleeping better  Hyponatremia  -TSH and cortisol normal. Nephrology consulted -getting another dose of samsca  History of TIA: Continue Aggrenox and statins.  Chronic anemia  being followed by patient's primary care physician.  Hemoglobin appears to be at baseline.  Supine hypertension  -need to allow as he has severe orthostatic hypotension   Nonischemic cardiomyopathy: -seen by cardiology   Will get palliative care consult for GOC   DVT prophylaxis:  Lovenox Code Status: DNR Family Communication:  Spoke with daughter at bedside Disposition Plan: Status is: Inpatient  Remains inpatient appropriate because:Inpatient level of care appropriate due to severity of illness   Dispo: The patient is from: Home              Anticipated d/c is to: SNF vs home--               Anticipated d/c date is: in AM since insurance was approved              Patient currently is not medically stable to d/c. needs safe d/c plan and stable Na  Consultants:   Cardiology  Nephrology  neurology   Subjective: Patient says he feels very rested this AM   Objective: Vitals:   02/19/20 1853 02/19/20 2343 02/20/20 0338 02/20/20 1217  BP: (!) 194/91 (!) 168/87 (!) 178/88 (!) 154/79  Pulse: 71 75 68 70  Resp: 19 19 20 16   Temp: 98.1 F (36.7 C) 98.4 F (36.9 C) 98 F (36.7 C) 98.7 F (37.1 C)  TempSrc: Oral Oral Axillary Oral  SpO2: 98% 95% 98% 96%    Intake/Output Summary (  Last 24 hours) at 02/20/2020 1459 Last data filed at 02/20/2020 1761 Gross per 24 hour  Intake 120 ml  Output 1250 ml  Net -1130 ml   There were no vitals filed for this visit.  Examination:   General: Appearance:    Well developed, well nourished male in no acute distress     Lungs:      respirations unlabored  Heart:    Normal heart rate. Normal rhythm.    MS:   All extremities are intact.    Neurologic:   Awake, alert, speech wet sounding     Data Reviewed: I have personally reviewed following labs and imaging studies  CBC: Recent Labs  Lab 02/18/20 0108  WBC 8.7  HGB 10.3*  HCT 29.8*  MCV 92.3  PLT 607   Basic Metabolic Panel: Recent Labs  Lab 02/14/20 0234 02/14/20 0234 02/15/20 0539 02/15/20 1234 02/17/20 0414 02/18/20 0108 02/18/20 1558 02/19/20 0836 02/20/20 0507  NA 125*   < > 124*   < > 129* 128* 130* 130* 128*  K 4.6   < > 4.0   < > 4.0 4.0 4.3 4.2 4.1  CL 94*   < > 92*   < > 96* 95* 94* 97* 96*  CO2 25   < > 23   < > 21* 24 25 26 25   GLUCOSE 91   < > 88   < > 92 102* 110* 108* 94  BUN 24*   < > 22   < > 29* 31* 28* 22 25*  CREATININE 1.08   < > 1.04   < > 1.12 1.14 1.16 1.16 1.17  CALCIUM 8.6*   < > 8.4*   < > 8.8* 8.7* 8.7* 8.8* 8.7*  MG 1.6*  --  1.7  --   --   --   --   --   --   PHOS 2.9  --  2.7  --   --   --   --  3.3 3.0   < > = values in this interval not displayed.   GFR: CrCl cannot be calculated (Unknown ideal weight.). Liver Function Tests: Recent Labs  Lab 02/19/20 0836 02/20/20 0507  ALBUMIN 2.8* 2.8*   No results for input(s): LIPASE, AMYLASE in the last 168 hours. No results for input(s): AMMONIA in the last 168 hours. Coagulation Profile: No results for input(s): INR, PROTIME in the last 168 hours. Cardiac Enzymes: No results for input(s): CKTOTAL, CKMB, CKMBINDEX, TROPONINI in the last 168 hours. BNP (last 3 results) No results for input(s): PROBNP in the last 8760 hours. HbA1C: No results for input(s): HGBA1C in the last 72 hours. CBG: No results for input(s): GLUCAP in the last 168 hours. Lipid Profile: No results for input(s): CHOL, HDL, LDLCALC, TRIG, CHOLHDL, LDLDIRECT in the last 72 hours. Thyroid Function Tests: No results for input(s): TSH, T4TOTAL, FREET4, T3FREE, THYROIDAB in the last 72 hours. Anemia Panel: No results for input(s): VITAMINB12, FOLATE, FERRITIN, TIBC, IRON, RETICCTPCT in the last 72  hours. Sepsis Labs: No results for input(s): PROCALCITON, LATICACIDVEN in the last 168 hours.  No results found for this or any previous visit (from the past 240 hour(s)).   Radiology Studies: No results found.  Scheduled Meds: . atomoxetine  20 mg Oral TID  . dipyridamole-aspirin  1 capsule Oral BID  . magnesium oxide  400 mg Oral Daily  . melatonin  5 mg Oral QHS  . pravastatin  20 mg Oral q1800  .  sodium chloride  2 g Oral TID WC   Continuous Infusions:    LOS: 11 days    Time spent: 35 mins  Geradine Girt, DO [facility]   If 7PM-7AM, please contact night-coverage

## 2020-02-20 NOTE — Progress Notes (Signed)
CSW called [employer] at [phone]. CSW is told by representative to call provider line.   CSW called provider line [phone] and reached [facility] department. CSW is informed that preauth is denied. CSW inquires about appeal and provides appeal number. Representative is not aware of appeal process. She states that CSW will need to call appeals line at [phone]. CSW called number and is redirected to provider line with robotic explanation that [phone] is the old provider line. Provider line has no options matching appeal or authorizations other than option 3 (pre auth) which CSW already tried.   1151: CSW called [employer] at [phone]

## 2020-02-20 NOTE — Progress Notes (Signed)
Patient crawled out of the bed at this time. RN found out bed alarm was not activated prior. He confirmed he did not fall or hit his head. No injury found with focus assessment. Assisted him back to the bed, reoriented and redirected him. Bed alarm activated and will cont. to monitor.

## 2020-02-21 DIAGNOSIS — Z515 Encounter for palliative care: Secondary | ICD-10-CM | POA: Diagnosis not present

## 2020-02-21 DIAGNOSIS — Z7189 Other specified counseling: Secondary | ICD-10-CM | POA: Diagnosis not present

## 2020-02-21 DIAGNOSIS — R55 Syncope and collapse: Secondary | ICD-10-CM | POA: Diagnosis not present

## 2020-02-21 LAB — CREATININE, URINE, RANDOM: Creatinine, Urine: 38.91 mg/dL

## 2020-02-21 LAB — RENAL FUNCTION PANEL
Albumin: 2.9 g/dL — ABNORMAL LOW (ref 3.5–5.0)
Anion gap: 8 (ref 5–15)
BUN: 22 mg/dL (ref 8–23)
CO2: 26 mmol/L (ref 22–32)
Calcium: 8.7 mg/dL — ABNORMAL LOW (ref 8.9–10.3)
Chloride: 93 mmol/L — ABNORMAL LOW (ref 98–111)
Creatinine, Ser: 1.08 mg/dL (ref 0.61–1.24)
GFR, Estimated: >60 mL/min (ref >60)
Glucose, Bld: 96 mg/dL (ref 70–99)
Phosphorus: 3 mg/dL (ref 2.5–4.6)
Potassium: 3.6 mmol/L (ref 3.5–5.1)
Sodium: 127 mmol/L — ABNORMAL LOW (ref 135–145)

## 2020-02-21 LAB — OSMOLALITY, URINE: Osmolality, Ur: 505 mOsm/kg (ref 300–900)

## 2020-02-21 LAB — SODIUM, URINE, RANDOM: Sodium, Ur: 158 mmol/L

## 2020-02-21 MED ORDER — MELATONIN 3 MG PO TABS
3.0000 mg | ORAL_TABLET | Freq: Every day | ORAL | Status: DC
  Administered 2020-02-21 – 2020-02-22 (×2): 3 mg via ORAL
  Filled 2020-02-21 (×3): qty 1

## 2020-02-21 MED ORDER — TRAZODONE HCL 50 MG PO TABS
25.0000 mg | ORAL_TABLET | Freq: Every day | ORAL | Status: DC
  Administered 2020-02-21: 25 mg via ORAL
  Filled 2020-02-21: qty 1

## 2020-02-21 NOTE — Progress Notes (Signed)
Clyde Canterbury (pt's daughter) updated about patient being physically aggressive and impulsive during the shift. Was appreciative and stated will pass on unto the rest of the family.

## 2020-02-21 NOTE — Progress Notes (Signed)
Palliative:  HPI: 85 y.o.malewithhistory of neurogenic orthostatic hypotension, nonischemic cardiomyopathy last EF measured was 35 to 40% with grade 2 diastolic dysfunction, supine hypertension, TIA, chronic anemia, chronic hyponatremia, chronic kidney disease stage III was brought to the ER after patient had at least 2 episodes of syncope at home.He was recently started on Strattera by his cardiologist for autonomic orthostatic hypotension. Patient is admitted for recurrent syncope. MRI head unremarkable.Patient had syncopal episodes twice during hospitalization.   I met today at Adam Clements's bedside along with daughter, Adam Clements. We discussed concern for his confusion last night and safety. I do have concern for his safety in nursing facility. Adam Clements expresses clear understanding of her father's declining health and end of life. However, he is currently noted to be eating well most of the time but with increasing confusion/delirium. I do not feel we are yet appropriate for hospice facility. Wife feels overwhelmed for him to return home with hospice (although this is ultimate plan after rehab stay). We discussed risks of benefits of trial of very low dose trazodone to assist with sleep. I worry that if he is not resting and is more confused he is at increased risk of fall and injury so maybe we can at least see if we can get him some rest at night. Adam Clements agrees that the benefits outweigh the risks of a trial of sleep medication. I believe trazodone as the most benign to try since melatonin is not working. Discussed also with Dr. Eliseo Squires.   Adam Clements plans to continue conversations with her family about risks vs benefits of facility placement and plan for hospice at home. Wife does seem set on brief rehab stay but hoping this allows them time to better plan for his return to home. Encouraged further family discussion. Will discuss further with wife tomorrow. Will see how he tolerates trial of trazodone.    All questions/concerns addressed. Emotional support provided. Discussed with Dr. Eliseo Squires.   Exam: Alert, confused. No distress. Sitting up in chair and tolerating during my visit. Breathing regular, unlabored. Abd flat. Able to feed self.   Plan: - Ongoing family discussion.  - Trial trazodone 25 mg qhs.  - Likely SNF rehab followed by home with hospice.   Cleary, NP Palliative Medicine Team Pager [phone] (Please see [email or web address] for schedule) Team Phone [phone]    Greater than 50%  of this time was spent counseling and coordinating care related to the above assessment and plan

## 2020-02-21 NOTE — Progress Notes (Signed)
Patient impulsive and in and out of bed several times. Reorientation and reassurance given. Will continue to monitor.

## 2020-02-21 NOTE — Progress Notes (Signed)
Patient ID: Adam Clements, male   DOB: 08-Apr-1934, 85 y.o.   MRN: [id number] S: Very lethargic and difficult to arouse.  Per his daughter he has been like this all morning. O:BP (!) 163/89 (BP Location: Left Arm)   Pulse 66   Temp 98 F (36.7 C) (Oral)   Resp 19   SpO2 97%   Intake/Output Summary (Last 24 hours) at 02/21/2020 1147 Last data filed at 02/21/2020 0728 Gross per 24 hour  Intake 360 ml  Output 1000 ml  Net -640 ml   Intake/Output: I/O last 3 completed shifts: In: 240 [P.O.:240] Out: 2250 [Urine:2250]  Intake/Output this shift:  Total I/O In: 120 [P.O.:120] Out: -  Weight change:  Gen: lethargic and unable to arouse CVS: RRR, no rub Resp: cta Abd: benign Ext: no edema  Recent Labs  Lab 02/15/20 0539 02/15/20 1234 02/16/20 0629 02/16/20 0629 02/16/20 1203 02/17/20 0414 02/18/20 0108 02/18/20 1558 02/19/20 0836 02/20/20 0507 02/21/20 0745  NA 124*   < > 129*   < > 129* 129* 128* 130* 130* 128* 127*  K 4.0  --  4.2  --   --  4.0 4.0 4.3 4.2 4.1 3.6  CL 92*  --  97*  --   --  96* 95* 94* 97* 96* 93*  CO2 23  --  23  --   --  21* 24 25 26 25 26   GLUCOSE 88  --  97  --   --  92 102* 110* 108* 94 96  BUN 22  --  27*  --   --  29* 31* 28* 22 25* 22  CREATININE 1.04  --  1.04  --   --  1.12 1.14 1.16 1.16 1.17 1.08  ALBUMIN  --   --   --   --   --   --   --   --  2.8* 2.8* 2.9*  CALCIUM 8.4*  --  8.8*  --   --  8.8* 8.7* 8.7* 8.8* 8.7* 8.7*  PHOS 2.7  --   --   --   --   --   --   --  3.3 3.0 3.0   < > = values in this interval not displayed.   Liver Function Tests: Recent Labs  Lab 02/19/20 0836 02/20/20 0507 02/21/20 0745  ALBUMIN 2.8* 2.8* 2.9*   No results for input(s): LIPASE, AMYLASE in the last 168 hours. No results for input(s): AMMONIA in the last 168 hours. CBC: Recent Labs  Lab 02/18/20 0108  WBC 8.7  HGB 10.3*  HCT 29.8*  MCV 92.3  PLT 253   Cardiac Enzymes: No results for input(s): CKTOTAL, CKMB, CKMBINDEX, TROPONINI in the  last 168 hours. CBG: No results for input(s): GLUCAP in the last 168 hours.  Iron Studies: No results for input(s): IRON, TIBC, TRANSFERRIN, FERRITIN in the last 72 hours. Studies/Results: No results found. Marland Kitchen atomoxetine  20 mg Oral TID  . dipyridamole-aspirin  1 capsule Oral BID  . magnesium oxide  400 mg Oral Daily  . melatonin  5 mg Oral QHS  . pravastatin  20 mg Oral q1800  . sodium chloride  2 g Oral TID WC    BMET    Component Value Date/Time   NA 127 (L) 02/21/2020 0745   K 3.6 02/21/2020 0745   CL 93 (L) 02/21/2020 0745   CO2 26 02/21/2020 0745   GLUCOSE 96 02/21/2020 0745   BUN 22 02/21/2020 0745  CREATININE 1.08 02/21/2020 0745   CREATININE 1.26 (H) 08/24/2019 1032   CREATININE 1.25 (H) 10/25/2015 0959   CALCIUM 8.7 (L) 02/21/2020 0745   GFRNONAA >60 02/21/2020 0745   GFRNONAA 52 (L) 08/24/2019 1032   GFRAA >60 08/24/2019 1032   CBC    Component Value Date/Time   WBC 8.7 02/18/2020 0108   RBC 3.23 (L) 02/18/2020 0108   HGB 10.3 (L) 02/18/2020 0108   HGB 10.4 (L) 08/24/2019 1032   HGB 9.6 (L) 02/22/2018 1606   HCT 29.8 (L) 02/18/2020 0108   HCT 29.2 (L) 02/22/2018 1606   PLT 253 02/18/2020 0108   PLT 204 08/24/2019 1032   PLT 254 02/22/2018 1606   MCV 92.3 02/18/2020 0108   MCV 96 02/22/2018 1606   MCH 31.9 02/18/2020 0108   MCHC 34.6 02/18/2020 0108   RDW 11.9 02/18/2020 0108   RDW 12.0 (L) 02/22/2018 1606   LYMPHSABS 0.7 02/07/2020 2049   MONOABS 0.6 02/07/2020 2049   EOSABS 0.1 02/07/2020 2049   BASOSABS 0.0 02/07/2020 2049    Assessment/Plan:  1. Acute on chronic hyponatremia- baseline sodium levels appear to range from 125-130 over the past 2 years.  TSH and cortisol WNL.  euvolemic on exam.  Urine sodium inappropriately high at 80 and Uosm 650 consistent with SIADH, however he also has symptomatic orthostatic hypotension and is on strattera which has been associated with hyponatremia.  Sodium has ranged 128-130 for the past 4 days and no  history of N/V or HA.   1. Continue with fluid restriction and sodium chloride tabs 2. Hold off on another dose of tolvaptan (received 7.5 mg on 11/24 and again on 02/18/20).  3. Continue to follow and ok if he remains at 128-130. 4. Could try urea but he is currently not able to take po.  Have seen EP note and agree with transition to comfort care.  Will await family decision. 2. Syncope with history of Neurogenic orthostatic hypotension- on straterra as an outpatient and follows with Dr. Caryl Comes. Has failed several therapies and has now progressed to inability to stand.  Agree with Palliative care consult and transition to hospice. 3. Nonischemic CMP 4. Grade 2 diastolic dysfunction 5. CKD stage IIIa- stable  Donetta Potts, MD [facility] [phone]

## 2020-02-21 NOTE — Progress Notes (Signed)
Physical Therapy Treatment Patient Details Name: Adam Clements MRN: [id number] DOB: [birth year] Today's Date: 02/21/2020    History of Present Illness 85yo male brought to the ED after multiple episodes of syncope during which he has hit his head and lost consiciousness. EKG in NSR with known R BBB, CTH and cervical spine imaging clear of acute injury. PMH neurogenic orthostatic hypotension/autonomic failure, cardiomyopathy, supine HTN, TIA, anemia, CKD, gout, memory loss, PVD    PT Comments    Patient sitting up in recliner today with BP controlled, though plummeted while trying to stand for pulling up pants and to measure BP.  Patient stated needed to sit, but denied symptoms.  Still very lethargic closing eyes and snoring while doing exercises.   Daughter in room and attentive, but continue to feel he may need SNF level rehab at d/c.  PT to continue to follow acutely.   Follow Up Recommendations  SNF;Supervision/Assistance - 24 hour     Equipment Recommendations  Wheelchair cushion (measurements PT);Wheelchair (measurements PT);Rolling walker with 5" wheels;3in1 (PT);Hospital bed    Recommendations for Other Services       Precautions / Restrictions Precautions Precautions: Fall;Other (comment) Precaution Comments: autonomic failure/neurogenic orthostatic hypotension- has gone syncopal in less than 20 seconds in standing with nursing, impulsive    Mobility  Bed Mobility               General bed mobility comments: sitting in recliner  Transfers Overall transfer level: Needs assistance Equipment used: Rolling walker (2 wheeled) Transfers: Sit to/from Stand Sit to Stand: Mod assist;+2 safety/equipment         General transfer comment: some lifting help from chair, walker in front, stood for attempted BP measurement, but to needing to sit prior to measurement  Ambulation/Gait             General Gait Details: deferred- safety/known orthostatic  drop   Stairs             Wheelchair Mobility    Modified Rankin (Stroke Patients Only)       Balance Overall balance assessment: Needs assistance Sitting-balance support: Feet supported Sitting balance-Leahy Scale: Fair   Postural control: Posterior lean Standing balance support: Bilateral upper extremity supported;During functional activity Standing balance-Leahy Scale: Poor Standing balance comment: reliant on external assist and BUE support.                            Cognition Arousal/Alertness: Lethargic Behavior During Therapy: Flat affect Overall Cognitive Status: Impaired/Different from baseline Area of Impairment: Orientation;Attention;Memory;Following commands;Safety/judgement;Awareness;Problem solving                 Orientation Level: Disoriented to;Situation;Time Current Attention Level: Focused Memory: Decreased recall of precautions;Decreased short-term memory Following Commands: Follows one step commands inconsistently;Follows one step commands with increased time Safety/Judgement: Decreased awareness of safety;Decreased awareness of deficits   Problem Solving: Slow processing;Requires verbal cues General Comments: daughter in room and reports pt more lethargic today      Exercises Total Joint Exercises Straight Leg Raises: Strengthening;10 reps;Both;Seated (reclined) General Exercises - Lower Extremity Long Arc Quad: AROM;Both;10 reps;Seated Straight Leg Raises: AROM;Both;10 reps;Seated (reclined) Hip Flexion/Marching: Strengthening;Both;10 reps;Seated Other Exercises Other Exercises: hip adductor squeezes with pillow between knees    General Comments General comments (skin integrity, edema, etc.): total A to don pajama pants after threading condom catheter through leg.      Pertinent Vitals/Pain Pain Assessment: Faces Faces Pain Scale: No  hurt    Home Living                      Prior Function             PT Goals (current goals can now be found in the care plan section) Progress towards PT goals: Not progressing toward goals - comment (continued limitations due to orthostatic hypotension)    Frequency    Min 2X/week      PT Plan Current plan remains appropriate    Co-evaluation              AM-PAC PT "6 Clicks" Mobility   Outcome Measure  Help needed turning from your back to your side while in a flat bed without using bedrails?: A Little Help needed moving from lying on your back to sitting on the side of a flat bed without using bedrails?: A Little Help needed moving to and from a bed to a chair (including a wheelchair)?: A Lot Help needed standing up from a chair using your arms (e.g., wheelchair or bedside chair)?: A Lot Help needed to walk in hospital room?: Total Help needed climbing 3-5 steps with a railing? : Total 6 Click Score: 12    End of Session   Activity Tolerance: Treatment limited secondary to medical complications (Comment) Patient left: in chair;with family/visitor present;with call bell/phone within reach   PT Visit Diagnosis: Unsteadiness on feet (R26.81);Muscle weakness (generalized) (M62.81);Repeated falls (R29.6);Difficulty in walking, not elsewhere classified (R26.2)     Time: 1205-1228 PT Time Calculation (min) (ACUTE ONLY): 23 min  Charges:  $Therapeutic Exercise: 8-22 mins $Therapeutic Activity: 8-22 mins                     Magda Kiel, PT Acute Rehabilitation Services DEYCX:[phone] Office:[phone] 02/21/2020    Reginia Naas 02/21/2020, 1:48 PM

## 2020-02-21 NOTE — Progress Notes (Signed)
Occupational Therapy Treatment Patient Details Name: Adam Clements MRN: [id number] DOB: [birth year] Today's Date: 02/21/2020    History of present illness 85yo male brought to the ED after multiple episodes of syncope during which he has hit his head and lost consiciousness. EKG in NSR with known R BBB, CTH and cervical spine imaging clear of acute injury. PMH neurogenic orthostatic hypotension/autonomic failure, cardiomyopathy, supine HTN, TIA, anemia, CKD, gout, memory loss, PVD   OT comments  Pt making steady progress towards OT goals this session. Pt initially alert upon OTA arrival agreeable to OT intervention, however pts level of arousal fluctuated throughout session. Vitals taken throughout session with overall  improvement in [place]. Pt sit<>stand x2 from recliner with MIN A +2 with RW with pt even able to take a couple steps ~ 37ft with RW and close chair follow, however pt noted to suddenly experience tremors and became unresponsive needing MAX A +2 to return pt to sitting in recliner. BP taken from chair 128/74- daughter reports that he has been having similar events lately. Alerted RN and left pt in recliner wth BP stable and daughter present. Continue to recommend SNF for DC, will follow acutely per POC.   All vitals taken with ABD binder and TEDS donned  sitting in recliner 142/93 prior to mobilization Post sit<> stand from recliner( unable to take accurate standing BP) 129/74 BP after walking ~ 3 ft and returning to sitting in recliner 128/74    Follow Up Recommendations  SNF;Supervision/Assistance - 24 hour    Equipment Recommendations  Other (comment);3 in 1 bedside commode    Recommendations for Other Services      Precautions / Restrictions Precautions Precautions: Fall;Other (comment) Precaution Comments: autonomic failure/neurogenic orthostatic hypotension- has gone syncopal in less than 20 seconds in standing with nursing, impulsive Restrictions Weight Bearing  Restrictions: No       Mobility Bed Mobility               General bed mobility comments: sitting in recliner  Transfers Overall transfer level: Needs assistance Equipment used: Rolling walker (2 wheeled) Transfers: Sit to/from Stand Sit to Stand: Min assist;+2 safety/equipment;+2 physical assistance;Max assist         General transfer comment: pt completed x2 sit<>stands from recliner with MIN A +2 for safety, pt even able to take steps this session ~ 3 ft however pt noted to experience tremors and became unrepsonsive needing MAX A +2 to lower pt back to chair    Balance Overall balance assessment: Needs assistance Sitting-balance support: Feet supported Sitting balance-Leahy Scale: Fair   Postural control: Posterior lean Standing balance support: Bilateral upper extremity supported;During functional activity Standing balance-Leahy Scale: Poor Standing balance comment: reliant on external assist and BUE support.                           ADL either performed or assessed with clinical judgement   ADL                           Toilet Transfer: Minimal assistance;+2 for safety/equipment;Ambulation Toilet Transfer Details (indicate cue type and reason): simulated via functional mobility         Functional mobility during ADLs: Minimal assistance;Cueing for safety;+2 for safety/equipment General ADL Comments: pt with TEDs and ABD donned, orthstasis improved during session but still with slight drop- of note pt presenting with tremor episode in standing needing manual facilitation  at hips for pt to sit in recliner, noted to have period of unresponsiveness however daughter reports he has been doing that- RN aware of episode     Vision       Perception     Praxis      Cognition Arousal/Alertness: Lethargic;Awake/alert (moments of lethargy and moments of wakefulness; fluctuated a lot during session) Behavior During Therapy: Flat  affect;Anxious Overall Cognitive Status: Impaired/Different from baseline Area of Impairment: Orientation;Attention;Memory;Following commands;Safety/judgement;Awareness;Problem solving                 Orientation Level: Disoriented to;Situation;Time (speaking unintelligibly about location and talking about "stocks" alot however able to accuraltey state wifes name and number of horses pt owns) Current Attention Level: Focused Memory: Decreased recall of precautions;Decreased short-term memory Following Commands: Follows one step commands inconsistently;Follows one step commands with increased time Safety/Judgement: Decreased awareness of safety;Decreased awareness of deficits Awareness: Intellectual Problem Solving: Slow processing;Requires verbal cues General Comments: pt continues to be plesantly confused this session. pt initally awake upon arrival but noted to have periods of lethargy. pt following commands most of session but did have period of unresponsiveness where pt not following commands        Exercises   Shoulder Instructions       General Comments sitting in recliner 142/93, post stand from recliner( unable to take accurate standing BP) 129/74, BP after walking ~ 3 ft and returning to sitting in recliner 128/74 ( all vitals taken with TEDS and ABD binder donned) pts dtr present during session, very appreciative and supportive- discussed therapy goals and POC with dtr requesting continued efforts to getting pt to SNF, although per chart review noted palliative involved    Pertinent Vitals/ Pain       Pain Assessment: Faces Faces Pain Scale: No hurt  Home Living                                          Prior Functioning/Environment              Frequency  Min 2X/week        Progress Toward Goals  OT Goals(current goals can now be found in the care plan section)  Progress towards OT goals: Progressing toward goals  Acute Rehab OT  Goals Patient Stated Goal: To get better Time For Goal Achievement: 02/23/20 Potential to Achieve Goals: [place] Discharge plan remains appropriate;Frequency remains appropriate    Co-evaluation                 AM-PAC OT "6 Clicks" Daily Activity     Outcome Measure   Help from another person eating meals?: A Little Help from another person taking care of personal grooming?: A Little Help from another person toileting, which includes using toliet, bedpan, or urinal?: A Lot Help from another person bathing (including washing, rinsing, drying)?: A Lot Help from another person to put on and taking off regular upper body clothing?: A Little Help from another person to put on and taking off regular lower body clothing?: A Lot 6 Click Score: 15    End of Session Equipment Utilized During Treatment: Gait belt;Rolling walker  OT Visit Diagnosis: Other abnormalities of gait and mobility (R26.89);Muscle weakness (generalized) (M62.81);History of falling (Z91.81);Other symptoms and signs involving cognitive function;Dizziness and giddiness (R42)   Activity Tolerance Patient tolerated treatment well   Patient Left  in chair;with call bell/phone within reach;with family/visitor present   Nurse Communication Mobility status        Time: 8867-7373 OT Time Calculation (min): 22 min  Charges: OT General Charges $OT Visit: 1 Visit OT Treatments $Therapeutic Activity: 8-22 mins  Adam Clam., COTA/L Acute Rehabilitation Services [phone]    Adam Clements 02/21/2020, 3:51 PM

## 2020-02-21 NOTE — Progress Notes (Signed)
PROGRESS NOTE    Adam MCCAUGHAN  OZH:[id number] DOB: 1935-01-03 DOA: 02/07/2020 PCP: Ria Bush, MD   Brief Narrative:  This 85 y.o.malewithhistory of neurogenic orthostatic hypotension, nonischemic cardiomyopathy last EF measured was 35 to 40% with grade 2 diastolic dysfunction, supine hypertension, TIA, chronic anemia, chronic hyponatremia, chronic kidney disease stage III was brought to the ER after patient had >2 episodes of syncope at home.  He was recently started on Strattera by his cardiologist for autonomic orthostatic hypotension.  Continues to have issues with syncope/orthostatic hypotension.   Difficult situation, do not think patient will fully recover to his prior functional status.  Appreciated Dr. Caryl Comes seeing patient.  Will continue to work with palliative care and family for safe d/c plan.  At this point patient has been approved for 5 days of rehab and family would like to pursue this.   Hope for d/c in the AM.  Assessment & Plan:   Principal Problem:   Syncope and collapse Active Problems:   NICM (nonischemic cardiomyopathy) (HCC)   Goals of care, counseling/discussion   Autonomic failure   Near syncope   Palliative care by specialist   DNR (do not resuscitate)   Syncope with history of severe autonomic orthostatic hypotension  - on Strattera outpatient -follows with Dr. Caryl Comes- appreciated his consult:   The patient has had progressive autonomic failure with worsening orthostatic hypotension and recurrent syncope.  We have tried multiple medications to try to mitigate as well as compressive wear of thighs and abdomen largely to progressively less avail.  Cognitive dysfunction is  Supervening.  I do not suspect that he will be  safely ambulatory going forward despite best efforts.  Nonpharmacological interventions including reverse Trendelenburg, minimizing bed rest and ongoing compressive wear in addition to Strattera are our options but as noted, I suspect  will be insufficient to allow for safe standing.  In this regard, I discussed with the patient and his wife on the phone and his daughter in the room goals of care.  At this juncture, his autonomic failure continues to progress to the point that it becomes potentially life threatening and life ending.  Been nonambulatory raises the  question as to where he and his family would  like his last days/weeks/months to be spent.  Furthermore, his prognosis is I think sufficiently worsening that hospice would be appropriately considered.   I have also discussed end-of-life issues with the patient, his wife and daughter, noting that his end-of-life is coming, that while he is able cognitively and physically, he can consider what his dying well would look like and with his family's help see to accomplish this.  - MRI brain was done which was unremarkable.   - PT and OT recommended skilled nursing facility- has been approved for 5 days of rehab: hope for d/c in the AM -palliative care consult  REM sleep behavior disorder? -trial of melatonin  Hyponatremia  -TSH and cortisol normal. Nephrology consulted and managing  History of TIA: Continue Aggrenox and statins.  Chronic anemia  being followed by patient's primary care physician.  Hemoglobin appears to be at baseline.  Supine hypertension  -need to allow as he has severe orthostatic hypotension   Nonischemic cardiomyopathy: -seen by cardiology   ? Trial of trazodone at night?   DVT prophylaxis:  Lovenox Code Status: DNR Family Communication:  Spoke with daughter at bedside Disposition Plan: Status is: Inpatient  Remains inpatient appropriate because:Inpatient level of care appropriate due to severity of illness  Dispo: The patient is from: Home              Anticipated d/c is to: SNF              Anticipated d/c date is: in AM since insurance was approved                  Consultants:    Cardiology  Nephrology  Neurology  Palliative care   Subjective: Sleepy this AM   Objective: Vitals:   02/20/20 1820 02/21/20 0021 02/21/20 0437 02/21/20 1209  BP: (!) 191/101 (!) 177/99 (!) 163/89 133/79  Pulse: 75 79 66 72  Resp: 16 18 19 18   Temp: 97.9 F (36.6 C) 98.6 F (37 C) 98 F (36.7 C) 97.9 F (36.6 C)  TempSrc: Oral Oral Oral Oral  SpO2: 95% 98% 97% 97%    Intake/Output Summary (Last 24 hours) at 02/21/2020 1307 Last data filed at 02/21/2020 3790 Gross per 24 hour  Intake 360 ml  Output 1000 ml  Net -640 ml   There were no vitals filed for this visit.  Examination:   General: Appearance:    elderly male  Who appears sleepy     Lungs:      respirations unlabored  Heart:    Normal heart rate. Normal rhythm.    MS:   All extremities are intact.   Neurologic:   Sleepy, hard of hearing. Soft, wet sounding voice      Data Reviewed: I have personally reviewed following labs and imaging studies  CBC: Recent Labs  Lab 02/18/20 0108  WBC 8.7  HGB 10.3*  HCT 29.8*  MCV 92.3  PLT 240   Basic Metabolic Panel: Recent Labs  Lab 02/15/20 0539 02/15/20 1234 02/18/20 0108 02/18/20 1558 02/19/20 0836 02/20/20 0507 02/21/20 0745  NA 124*   < > 128* 130* 130* 128* 127*  K 4.0   < > 4.0 4.3 4.2 4.1 3.6  CL 92*   < > 95* 94* 97* 96* 93*  CO2 23   < > 24 25 26 25 26   GLUCOSE 88   < > 102* 110* 108* 94 96  BUN 22   < > 31* 28* 22 25* 22  CREATININE 1.04   < > 1.14 1.16 1.16 1.17 1.08  CALCIUM 8.4*   < > 8.7* 8.7* 8.8* 8.7* 8.7*  MG 1.7  --   --   --   --   --   --   PHOS 2.7  --   --   --  3.3 3.0 3.0   < > = values in this interval not displayed.   GFR: CrCl cannot be calculated (Unknown ideal weight.). Liver Function Tests: Recent Labs  Lab 02/19/20 0836 02/20/20 0507 02/21/20 0745  ALBUMIN 2.8* 2.8* 2.9*   No results for input(s): LIPASE, AMYLASE in the last 168 hours. No results for input(s): AMMONIA in the last 168  hours. Coagulation Profile: No results for input(s): INR, PROTIME in the last 168 hours. Cardiac Enzymes: No results for input(s): CKTOTAL, CKMB, CKMBINDEX, TROPONINI in the last 168 hours. BNP (last 3 results) No results for input(s): PROBNP in the last 8760 hours. HbA1C: No results for input(s): HGBA1C in the last 72 hours. CBG: No results for input(s): GLUCAP in the last 168 hours. Lipid Profile: No results for input(s): CHOL, HDL, LDLCALC, TRIG, CHOLHDL, LDLDIRECT in the last 72 hours. Thyroid Function Tests: No results for input(s): TSH, T4TOTAL, FREET4, T3FREE, THYROIDAB  in the last 72 hours. Anemia Panel: No results for input(s): VITAMINB12, FOLATE, FERRITIN, TIBC, IRON, RETICCTPCT in the last 72 hours. Sepsis Labs: No results for input(s): PROCALCITON, LATICACIDVEN in the last 168 hours.  No results found for this or any previous visit (from the past 240 hour(s)).   Radiology Studies: No results found.  Scheduled Meds: . atomoxetine  20 mg Oral TID  . dipyridamole-aspirin  1 capsule Oral BID  . magnesium oxide  400 mg Oral Daily  . melatonin  5 mg Oral QHS  . pravastatin  20 mg Oral q1800  . sodium chloride  2 g Oral TID WC  . traZODone  25 mg Oral QHS   Continuous Infusions:    LOS: 12 days    Time spent: 35 mins  Geradine Girt, DO [facility]   If 7PM-7AM, please contact night-coverage

## 2020-02-21 NOTE — Progress Notes (Signed)
Patient continue to be impulsive and aggressive, staff could no longer redirect patient. Bil soft wrist restraints ordered and applied. Will continue to monitor.

## 2020-02-22 DIAGNOSIS — G459 Transient cerebral ischemic attack, unspecified: Secondary | ICD-10-CM | POA: Diagnosis not present

## 2020-02-22 DIAGNOSIS — E871 Hypo-osmolality and hyponatremia: Secondary | ICD-10-CM | POA: Diagnosis not present

## 2020-02-22 DIAGNOSIS — R5381 Other malaise: Secondary | ICD-10-CM | POA: Diagnosis not present

## 2020-02-22 DIAGNOSIS — Z515 Encounter for palliative care: Secondary | ICD-10-CM | POA: Diagnosis not present

## 2020-02-22 DIAGNOSIS — M2550 Pain in unspecified joint: Secondary | ICD-10-CM | POA: Diagnosis not present

## 2020-02-22 DIAGNOSIS — R41841 Cognitive communication deficit: Secondary | ICD-10-CM | POA: Diagnosis not present

## 2020-02-22 DIAGNOSIS — I5022 Chronic systolic (congestive) heart failure: Secondary | ICD-10-CM | POA: Diagnosis not present

## 2020-02-22 DIAGNOSIS — R296 Repeated falls: Secondary | ICD-10-CM | POA: Diagnosis not present

## 2020-02-22 DIAGNOSIS — Z7401 Bed confinement status: Secondary | ICD-10-CM | POA: Diagnosis not present

## 2020-02-22 DIAGNOSIS — D649 Anemia, unspecified: Secondary | ICD-10-CM | POA: Diagnosis not present

## 2020-02-22 DIAGNOSIS — N183 Chronic kidney disease, stage 3 unspecified: Secondary | ICD-10-CM | POA: Diagnosis not present

## 2020-02-22 DIAGNOSIS — I428 Other cardiomyopathies: Secondary | ICD-10-CM | POA: Diagnosis not present

## 2020-02-22 DIAGNOSIS — Z7189 Other specified counseling: Secondary | ICD-10-CM | POA: Diagnosis not present

## 2020-02-22 DIAGNOSIS — I951 Orthostatic hypotension: Secondary | ICD-10-CM | POA: Diagnosis not present

## 2020-02-22 DIAGNOSIS — R278 Other lack of coordination: Secondary | ICD-10-CM | POA: Diagnosis not present

## 2020-02-22 DIAGNOSIS — R55 Syncope and collapse: Secondary | ICD-10-CM | POA: Diagnosis not present

## 2020-02-22 DIAGNOSIS — G903 Multi-system degeneration of the autonomic nervous system: Secondary | ICD-10-CM | POA: Diagnosis not present

## 2020-02-22 DIAGNOSIS — R2689 Other abnormalities of gait and mobility: Secondary | ICD-10-CM | POA: Diagnosis not present

## 2020-02-22 LAB — RENAL FUNCTION PANEL
Albumin: 2.8 g/dL — ABNORMAL LOW (ref 3.5–5.0)
Anion gap: 11 (ref 5–15)
BUN: 24 mg/dL — ABNORMAL HIGH (ref 8–23)
CO2: 22 mmol/L (ref 22–32)
Calcium: 9 mg/dL (ref 8.9–10.3)
Chloride: 98 mmol/L (ref 98–111)
Creatinine, Ser: 1.11 mg/dL (ref 0.61–1.24)
GFR, Estimated: >60 mL/min (ref >60)
Glucose, Bld: 93 mg/dL (ref 70–99)
Phosphorus: 3.4 mg/dL (ref 2.5–4.6)
Potassium: 4.3 mmol/L (ref 3.5–5.1)
Sodium: 131 mmol/L — ABNORMAL LOW (ref 135–145)

## 2020-02-22 LAB — SARS CORONAVIRUS 2 BY RT PCR (HOSPITAL ORDER, PERFORMED IN ~~LOC~~ HOSPITAL LAB): SARS Coronavirus 2: NEGATIVE

## 2020-02-22 MED ORDER — TRAZODONE HCL 50 MG PO TABS
25.0000 mg | ORAL_TABLET | Freq: Every day | ORAL | Status: DC
  Administered 2020-02-22: 25 mg via ORAL
  Filled 2020-02-22: qty 1

## 2020-02-22 MED ORDER — MELATONIN 3 MG PO TABS: 3.0000 mg | ORAL_TABLET | Freq: Every day | ORAL | 0 refills | Status: AC

## 2020-02-22 MED ORDER — MAGNESIUM OXIDE 400 (241.3 MG) MG PO TABS: 400.0000 mg | ORAL_TABLET | Freq: Every day | ORAL | Status: AC

## 2020-02-22 MED ORDER — TRAZODONE HCL 50 MG PO TABS: 25.0000 mg | ORAL_TABLET | Freq: Every day | ORAL | Status: AC

## 2020-02-22 MED ORDER — ATOMOXETINE HCL 10 MG PO CAPS
20.0000 mg | ORAL_CAPSULE | Freq: Three times a day (TID) | ORAL | Status: DC
Start: 2020-02-22 — End: 2020-03-09

## 2020-02-22 MED ORDER — SODIUM CHLORIDE 1 G PO TABS: 2.0000 g | ORAL_TABLET | Freq: Three times a day (TID) | ORAL | Status: AC

## 2020-02-22 NOTE — Discharge Summary (Signed)
Adam Clements PTW:[id number] DOB: [birth year] DOA: 02/07/2020  PCP: Ria Bush, MD  Admit date: 02/07/2020 Discharge date: 02/22/2020  Admitted From: home Disposition:  SNF  Recommendations for Outpatient Follow-up:  1. Follow up with PCP in 1 week 2. Please obtain BMP/CBC in one week 3. Follow up with cardiology in one week 4. Follow up with neurology in one week 5. Follow up with nephrology in one week  Home Health:Upon discharge, patient will go home with hospice   Discharge Condition:Stable CODE STATUS:DNR  Diet recommendation: Regular  Brief/Interim Summary: Please note this is my first time encountering this patient. Discharge summary based on HPI and review of records. This85 y.o.malewithhistory of neurogenic orthostatic hypotension, nonischemic cardiomyopathy last EF measured was 35 to 40% with grade 2 diastolic dysfunction, supine hypertension, TIA, chronic anemia, chronic hyponatremia, chronic kidney disease stage III was brought to the ER after patient had at least 2 episodes of syncope at home.He was recently started on Strattera by his cardiologist for autonomic orthostatic hypotension. Patient is admitted for recurrent syncope. MRI head unremarkable.  Patient had syncopal episodes twice during hospitalization.     Syncope with history of severe autonomic orthostatic hypotension  - on Strattera outpatient -follows with Dr. Caryl Comes- - who saw patient in the hospital, who thought would be best to have hospice involved due to his worsening of his clinical status. Nonpharmacological interventions including reverse Trendelenburg, minimizing bed rest and ongoing compressive wear in addition to Strattera are our options . - MRI brain was done which was unremarkable.  - PT and OT recommended skilled nursing facility  Statterra dose increased to 3 times daily,  repeat echocardiogram showed EF 35 to 40% Avoid fluorinef. -bed in reverse trendelenberg Follow up with  neurology as outpatient  REM sleep behavior disorder? -trial of melatonin- will need to adjust -seems to be sleeping better  Hyponatremia  -TSH and cortisol normal. Nephrology was consulted -getting another dose of samsca  History of TIA: Continue Aggrenox and statins.  Chronic anemia  being followed by patient's primary care physician. Hemoglobin appears to be at baseline.  Supine hypertension  -need to allow as he has severe orthostatic hypotension   Nonischemic cardiomyopathy: -followed by cardiology  Patient will be going home with home hospice after rehab completion  Discharge Diagnoses:  Principal Problem:   Syncope and collapse Active Problems:   NICM (nonischemic cardiomyopathy) (HCC)   Goals of care, counseling/discussion   Autonomic failure   Near syncope   Palliative care by specialist   DNR (do not resuscitate)    Discharge Instructions  Discharge Instructions    Call MD for:  temperature >100.4   Complete by: As directed    Diet - low sodium heart healthy   Complete by: As directed    Increase activity slowly   Complete by: As directed      Allergies as of 02/22/2020      Reactions   Atorvastatin Other (See Comments)   Memory trouble, confusion, fatigue   Pletal [cilostazol]       Medication List    TAKE these medications   atomoxetine 10 MG capsule Commonly known as: STRATTERA Take 2 capsules (20 mg total) by mouth 3 (three) times daily. What changed: when to take this   cholecalciferol 1000 units tablet Commonly known as: VITAMIN D Take 1,000 Units by mouth daily.   dipyridamole-aspirin 200-25 MG 12hr capsule Commonly known as: AGGRENOX TAKE 1 CAPSULE BY MOUTH TWICE A DAY   magnesium oxide 400 (  241.3 Mg) MG tablet Commonly known as: MAG-OX Take 1 tablet (400 mg total) by mouth daily. Start taking on: February 23, 2020   melatonin 3 MG Tabs tablet Take 1 tablet (3 mg total) by mouth at bedtime.   pravastatin 20 MG  tablet Commonly known as: PRAVACHOL TAKE 1 TABLET(20 MG) BY MOUTH DAILY What changed: See the new instructions.   sodium chloride 1 g tablet Take 2 tablets (2 g total) by mouth 3 (three) times daily with meals.   traZODone 50 MG tablet Commonly known as: DESYREL Take 0.5 tablets (25 mg total) by mouth daily at 8 pm.   vitamin B-12 500 MCG tablet Commonly known as: CYANOCOBALAMIN Take 1 tablet (500 mcg total) by mouth every Monday, Wednesday, and Friday.            Durable Medical Equipment  (From admission, onward)         Start     Ordered   02/17/20 1241  For home use only DME lightweight manual wheelchair with seat cushion  Once       Comments: Patient suffers from severe orthostatic hypotension which impairs their ability to perform daily activities like toileting in the home.  A walker will not resolve  issue with performing activities of daily living. A wheelchair will allow patient to safely perform daily activities. Patient is not able to propel themselves in the home using a standard weight wheelchair due to arm weakness. Patient can self propel in the lightweight wheelchair. Length of need Lifetime. Accessories: elevating leg rests (ELRs), wheel locks, extensions and anti-tippers.   02/17/20 1240   02/17/20 1240  For home use only DME 3 n 1  Once        02/17/20 1240   02/17/20 1240  For home use only DME Hospital bed  Once       Question Answer Comment  Length of Need Lifetime   The above medical condition requires: Patient requires the ability to reposition frequently   Head must be elevated greater than: 30 degrees   Bed type Semi-electric      02/17/20 1240          Follow-up Information    Ria Bush, MD Follow up in 1 week(s).   Specialty: Family Medicine Contact information: [place] [address] [phone]        Deboraha Sprang, MD Follow up in 1 week(s).   Specialty: Cardiology Contact information: [address]  [place] [address] [phone]        Donato Heinz, MD Follow up in 1 week(s).   Specialty: Nephrology Contact information: [address] [place] [address] [phone]              Allergies  Allergen Reactions  . Atorvastatin Other (See Comments)    Memory trouble, confusion, fatigue  . Pletal [Cilostazol]     Consultations:  Cardiology, nephrology, palliative care   Procedures/Studies: DG Chest 2 View  Result Date: 02/07/2020 CLINICAL DATA:  Multiple falls EXAM: CHEST - 2 VIEW COMPARISON:  2017 FINDINGS: Increased top-normal heart size. Chronic interstitial prominence. No pleural effusion. No acute osseous abnormality. IMPRESSION: Chronic interstitial changes.  Increased top-normal heart size. Electronically Signed   By: Macy Mis M.D.   On: 02/07/2020 21:47   DG Abd 1 View  Result Date: 02/08/2020 CLINICAL DATA:  MRI clearance EXAM: ABDOMEN - 1 VIEW COMPARISON:  None. FINDINGS: Normal abdominal gas pattern. Several surgical clips  are noted within the right lower quadrant. No unexpected metallic foreign body within the abdomen. Vascular calcifications are seen within the pelvis. Degenerative changes are seen within the lumbar spine. IMPRESSION: No unexpected metallic foreign body within the abdomen. Electronically Signed   By: Fidela Salisbury MD   On: 02/08/2020 05:00   CT Head Wo Contrast  Result Date: 02/07/2020 CLINICAL DATA:  Multiple falls, orthostatic, normocytic anemia EXAM: CT HEAD WITHOUT CONTRAST CT CERVICAL SPINE WITHOUT CONTRAST TECHNIQUE: Multidetector CT imaging of the head and cervical spine was performed following the standard protocol without intravenous contrast. Multiplanar CT image reconstructions of the cervical spine were also generated. COMPARISON:  CT 08/26/2017, MRI 02/22/2016 FINDINGS: CT HEAD FINDINGS Brain: Scattered hypoattenuating foci in the bilateral basal ganglia and external capsule likely  reflect areas of remote lacunar type infarcts. No evidence of acute infarction, hemorrhage, hydrocephalus, extra-axial collection, visible mass lesion or mass effect. Symmetric prominence of the ventricles, cisterns and sulci compatible with parenchymal volume loss. Patchy areas of white matter hypoattenuation are most compatible with chronic microvascular angiopathy. Vascular: Atherosclerotic calcification of the carotid siphons and intradural vertebral arteries. No hyperdense vessel. Skull: Some mild right temporoparietal and left temporal scalp thickening. Additional thickening and possible laceration of the left frontal scalp as well. No calvarial fracture or acute osseous injury is identified. Sinuses/Orbits: Paranasal sinuses and mastoid air cells are predominantly clear. Orbital structures are unremarkable aside from prior lens extractions. Other: None. CT CERVICAL SPINE FINDINGS Alignment: Cervical stabilization collar is in place at the time of examination. Mild straightening of normal cervical lordosis, possibly positional, degenerative or related to muscle spasm. No evidence of traumatic listhesis. No abnormally widened, perched or jumped facets. Normal alignment of the craniocervical and atlantoaxial articulations. Skull base and vertebrae: No acute skull base fracture. No vertebral body fracture or height loss. Normal bone mineralization. No worrisome osseous lesions. Moderate arthrosis at the atlantodental interval. Cervical spondylitic changes, detailed below. Soft tissues and spinal canal: No pre or paravertebral fluid or swelling. No visible canal hematoma. Disc levels: Multilevel intervertebral disc height loss with spondylitic endplate changes. Multilevel disc osteophyte complexes efface the ventral thecal sac without significant resulting spinal canal stenosis. Multilevel uncinate spurring and facet degenerative changes which are most pronounced on the left at C3-4 and on the right at C2-3 where  more moderate foraminal impingement is present. More mild multilevel foraminal narrowing is seen throughout the remaining cervical levels. Upper chest: Extensive biapical pleuroparenchymal scarring is noted with some apical emphysematous changes as well. Cervical carotid atherosclerosis is present. Other: No concerning thyroid nodules or masses. IMPRESSION: 1. No acute intracranial abnormality. 2. Some mild right temporoparietal and left temporal scalp thickening. Additional thickening and possible laceration of the left frontal scalp as well. No calvarial fracture or acute osseous injury is identified. 3. Chronic microvascular ischemic changes, parenchymal volume loss and intracranial atherosclerosis. 4. Punctate hypoattenuating foci in the bilateral basal ganglia and external capsules compatible with prior lacunar infarcts. 5. No evidence of acute fracture or traumatic listhesis of the cervical spine. 6. Cervical spondylitic and facet degenerative changes, as above. 7. Cervical and intracranial atherosclerosis. Electronically Signed   By: Lovena Le M.D.   On: 02/07/2020 22:05   CT Cervical Spine Wo Contrast  Result Date: 02/07/2020 CLINICAL DATA:  Multiple falls, orthostatic, normocytic anemia EXAM: CT HEAD WITHOUT CONTRAST CT CERVICAL SPINE WITHOUT CONTRAST TECHNIQUE: Multidetector CT imaging of the head and cervical spine was performed following the standard protocol without intravenous contrast. Multiplanar CT  image reconstructions of the cervical spine were also generated. COMPARISON:  CT 08/26/2017, MRI 02/22/2016 FINDINGS: CT HEAD FINDINGS Brain: Scattered hypoattenuating foci in the bilateral basal ganglia and external capsule likely reflect areas of remote lacunar type infarcts. No evidence of acute infarction, hemorrhage, hydrocephalus, extra-axial collection, visible mass lesion or mass effect. Symmetric prominence of the ventricles, cisterns and sulci compatible with parenchymal volume loss.  Patchy areas of white matter hypoattenuation are most compatible with chronic microvascular angiopathy. Vascular: Atherosclerotic calcification of the carotid siphons and intradural vertebral arteries. No hyperdense vessel. Skull: Some mild right temporoparietal and left temporal scalp thickening. Additional thickening and possible laceration of the left frontal scalp as well. No calvarial fracture or acute osseous injury is identified. Sinuses/Orbits: Paranasal sinuses and mastoid air cells are predominantly clear. Orbital structures are unremarkable aside from prior lens extractions. Other: None. CT CERVICAL SPINE FINDINGS Alignment: Cervical stabilization collar is in place at the time of examination. Mild straightening of normal cervical lordosis, possibly positional, degenerative or related to muscle spasm. No evidence of traumatic listhesis. No abnormally widened, perched or jumped facets. Normal alignment of the craniocervical and atlantoaxial articulations. Skull base and vertebrae: No acute skull base fracture. No vertebral body fracture or height loss. Normal bone mineralization. No worrisome osseous lesions. Moderate arthrosis at the atlantodental interval. Cervical spondylitic changes, detailed below. Soft tissues and spinal canal: No pre or paravertebral fluid or swelling. No visible canal hematoma. Disc levels: Multilevel intervertebral disc height loss with spondylitic endplate changes. Multilevel disc osteophyte complexes efface the ventral thecal sac without significant resulting spinal canal stenosis. Multilevel uncinate spurring and facet degenerative changes which are most pronounced on the left at C3-4 and on the right at C2-3 where more moderate foraminal impingement is present. More mild multilevel foraminal narrowing is seen throughout the remaining cervical levels. Upper chest: Extensive biapical pleuroparenchymal scarring is noted with some apical emphysematous changes as well. Cervical  carotid atherosclerosis is present. Other: No concerning thyroid nodules or masses. IMPRESSION: 1. No acute intracranial abnormality. 2. Some mild right temporoparietal and left temporal scalp thickening. Additional thickening and possible laceration of the left frontal scalp as well. No calvarial fracture or acute osseous injury is identified. 3. Chronic microvascular ischemic changes, parenchymal volume loss and intracranial atherosclerosis. 4. Punctate hypoattenuating foci in the bilateral basal ganglia and external capsules compatible with prior lacunar infarcts. 5. No evidence of acute fracture or traumatic listhesis of the cervical spine. 6. Cervical spondylitic and facet degenerative changes, as above. 7. Cervical and intracranial atherosclerosis. Electronically Signed   By: Lovena Le M.D.   On: 02/07/2020 22:05   MR BRAIN WO CONTRAST  Result Date: 02/08/2020 CLINICAL DATA:  Neuro deficit with acute stroke suspected. EXAM: MRI HEAD WITHOUT CONTRAST TECHNIQUE: Multiplanar, multiecho pulse sequences of the brain and surrounding structures were obtained without intravenous contrast. COMPARISON:  Head CT from yesterday FINDINGS: Brain: Prominent generalized cortical atrophy. No acute infarct, hemorrhage, hydrocephalus, or collection. No masslike finding. Mild chronic white matter disease for age. Vascular: Preserved flow voids Skull and upper cervical spine: No focal marrow lesion. Facet osteoarthritis with notable spurring on the right at C2-3. Sinuses/Orbits: Bilateral cataract resection. Opacified left frontal sinus without fluid level IMPRESSION: 1. No acute or reversible finding. 2. Generalized cortical atrophy. Electronically Signed   By: Monte Fantasia M.D.   On: 02/08/2020 06:32   ECHOCARDIOGRAM COMPLETE  Result Date: 02/08/2020    ECHOCARDIOGRAM REPORT   Patient Name:   Adam Clements Date of  Exam: 02/08/2020 Medical Rec #:  [id number]       Height:       67.2 in Accession #:    [id number]       Weight:       145.6 lb Date of Birth:  May 24, 1934        BSA:          1.771 m Patient Age:    70 years        BP:           151/94 mmHg Patient Gender: M               HR:           65 bpm. Exam Location:  Inpatient Procedure: 2D Echo, Cardiac Doppler and Color Doppler Indications:    Syncope  History:        Patient has prior history of Echocardiogram examinations, most                 recent 10/19/2017. Mitral Valve Disease, Arrythmias:RBBB and PVC,                 Signs/Symptoms:Syncope; Risk Factors:Hypertension and Former                 Smoker. Non-ischemic cardiomyopathy, HFrEF, PAD.  Sonographer:    Clayton Lefort RDCS (AE) Referring Phys: [place] Comments: Suboptimal subcostal window. IMPRESSIONS  1. Left ventricular ejection fraction, by estimation, is 35 to 40%. The left ventricle has moderately decreased function. The left ventricle demonstrates global hypokinesis. There is severe concentric left ventricular hypertrophy. Left ventricular diastolic parameters are indeterminate.  2. Right ventricular systolic function is normal. The right ventricular size is normal.  3. The mitral valve is grossly normal. Trivial mitral valve regurgitation.  4. The aortic valve was not well visualized. Aortic valve regurgitation is not visualized. No aortic stenosis is present.  5. Aortic dilatation noted. There is mild dilatation of the aortic root, measuring 40 mm. There is borderline dilatation of the ascending aorta, measuring 37 mm. Comparison(s): A prior study was performed on 10/20/19. Compared to prior: similar LV function and stroke volume. Slight dilation of the aortic root. Calcifed papillary muscle stable from 2019 study. FINDINGS  Left Ventricle: Left ventricular ejection fraction, by estimation, is 35 to 40%. The left ventricle has moderately decreased function. The left ventricle demonstrates global hypokinesis. The left ventricular internal cavity size was normal in size. There is  severe concentric left ventricular hypertrophy. Left ventricular diastolic parameters are indeterminate. Right Ventricle: The right ventricular size is normal. No increase in right ventricular wall thickness. Right ventricular systolic function is normal. Left Atrium: Left atrial size was normal in size. Right Atrium: Right atrial size was normal in size. Pericardium: There is no evidence of pericardial effusion. Mitral Valve: The mitral valve is grossly normal. Mild mitral annular calcification. Trivial mitral valve regurgitation. Tricuspid Valve: The tricuspid valve is normal in structure. Tricuspid valve regurgitation is not demonstrated. Aortic Valve: The aortic valve was not well visualized. Aortic valve regurgitation is not visualized. No aortic stenosis is present. Aortic valve mean gradient measures 3.0 mmHg. Aortic valve peak gradient measures 5.8 mmHg. Aortic valve area, by VTI measures 1.83 cm. Pulmonic Valve: The pulmonic valve was not well visualized. Pulmonic valve regurgitation is not visualized. Aorta: Aortic dilatation noted. There is mild dilatation of the aortic root, measuring 40 mm. There is borderline dilatation of the ascending aorta, measuring 37 mm. IAS/Shunts:  The atrial septum is grossly normal.  LEFT VENTRICLE PLAX 2D LVIDd:         4.70 cm LVIDs:         4.30 cm LV PW:         1.50 cm LV IVS:        1.60 cm LVOT diam:     1.90 cm LV SV:         42 LV SV Index:   24 LVOT Area:     2.84 cm  LV Volumes (MOD) LV vol d, MOD A2C: 116.0 ml LV vol d, MOD A4C: 154.0 ml LV vol s, MOD A2C: 63.3 ml LV vol s, MOD A4C: 107.0 ml LV SV MOD A2C:     52.7 ml LV SV MOD A4C:     154.0 ml LV SV MOD BP:      52.6 ml RIGHT VENTRICLE RV Basal diam:  3.50 cm RV Mid diam:    2.40 cm RV S prime:     10.60 cm/s TAPSE (M-mode): 2.2 cm LEFT ATRIUM           Index       RIGHT ATRIUM           Index LA diam:      3.80 cm 2.15 cm/m  RA Area:     17.60 cm LA Vol (A2C): 47.2 ml 26.65 ml/m RA Volume:   50.60 ml  28.57  ml/m LA Vol (A4C): 55.7 ml 31.45 ml/m  AORTIC VALVE AV Area (Vmax):    1.58 cm AV Area (Vmean):   1.47 cm AV Area (VTI):     1.83 cm AV Vmax:           120.00 cm/s AV Vmean:          74.700 cm/s AV VTI:            0.228 m AV Peak Grad:      5.8 mmHg AV Mean Grad:      3.0 mmHg LVOT Vmax:         66.70 cm/s LVOT Vmean:        38.600 cm/s LVOT VTI:          0.147 m LVOT/AV VTI ratio: 0.64  AORTA Ao Root diam: 4.00 cm Ao Asc diam:  3.70 cm  SHUNTS Systemic VTI:  0.15 m Systemic Diam: 1.90 cm Rudean Haskell MD Electronically signed by Rudean Haskell MD Signature Date/Time: 02/08/2020/5:00:16 PM    Final       Subjective: Daughter at bedside. Has no complaints.   Discharge Exam: Vitals:   02/21/20 1209 02/21/20 1632  BP: 133/79 (!) 153/65  Pulse: 72 71  Resp: 18 18  Temp: 97.9 F (36.6 C) 98.4 F (36.9 C)  SpO2: 97% 96%   Vitals:   02/21/20 0021 02/21/20 0437 02/21/20 1209 02/21/20 1632  BP: (!) 177/99 (!) 163/89 133/79 (!) 153/65  Pulse: 79 66 72 71  Resp: 18 19 18 18   Temp: 98.6 F (37 C) 98 F (36.7 C) 97.9 F (36.6 C) 98.4 F (36.9 C)  TempSrc: Oral Oral Oral Oral  SpO2: 98% 97% 97% 96%    General: Pt is alert, awake, not in acute distress Cardiovascular: RRR, S1/S2 +, no rubs, no gallops Respiratory: CTA bilaterally, no wheezing, no rhonchi Abdominal: Soft, NT, ND, bowel sounds + Extremities: no edema, no cyanosis    The results of significant diagnostics from this hospitalization (including imaging, microbiology, ancillary and laboratory) are listed  below for reference.     Microbiology: No results found for this or any previous visit (from the past 240 hour(s)).   Labs: BNP (last 3 results) No results for input(s): BNP in the last 8760 hours. Basic Metabolic Panel: Recent Labs  Lab 02/18/20 1558 02/19/20 0836 02/20/20 0507 02/21/20 0745 02/22/20 0324  NA 130* 130* 128* 127* 131*  K 4.3 4.2 4.1 3.6 4.3  CL 94* 97* 96* 93* 98  CO2 25 26 25  26 22   GLUCOSE 110* 108* 94 96 93  BUN 28* 22 25* 22 24*  CREATININE 1.16 1.16 1.17 1.08 1.11  CALCIUM 8.7* 8.8* 8.7* 8.7* 9.0  PHOS  --  3.3 3.0 3.0 3.4   Liver Function Tests: Recent Labs  Lab 02/19/20 0836 02/20/20 0507 02/21/20 0745 02/22/20 0324  ALBUMIN 2.8* 2.8* 2.9* 2.8*   No results for input(s): LIPASE, AMYLASE in the last 168 hours. No results for input(s): AMMONIA in the last 168 hours. CBC: Recent Labs  Lab 02/18/20 0108  WBC 8.7  HGB 10.3*  HCT 29.8*  MCV 92.3  PLT 253   Cardiac Enzymes: No results for input(s): CKTOTAL, CKMB, CKMBINDEX, TROPONINI in the last 168 hours. BNP: Invalid input(s): POCBNP CBG: No results for input(s): GLUCAP in the last 168 hours. D-Dimer No results for input(s): DDIMER in the last 72 hours. Hgb A1c No results for input(s): HGBA1C in the last 72 hours. Lipid Profile No results for input(s): CHOL, HDL, LDLCALC, TRIG, CHOLHDL, LDLDIRECT in the last 72 hours. Thyroid function studies No results for input(s): TSH, T4TOTAL, T3FREE, THYROIDAB in the last 72 hours.  Invalid input(s): FREET3 Anemia work up No results for input(s): VITAMINB12, FOLATE, FERRITIN, TIBC, IRON, RETICCTPCT in the last 72 hours. Urinalysis    Component Value Date/Time   COLORURINE YELLOW 02/08/2020 0809   APPEARANCEUR CLEAR 02/08/2020 0809   LABSPEC 1.017 02/08/2020 0809   PHURINE 5.0 02/08/2020 0809   GLUCOSEU 50 (A) 02/08/2020 0809   HGBUR SMALL (A) 02/08/2020 0809   HGBUR negative 01/13/2008 0845   BILIRUBINUR NEGATIVE 02/08/2020 0809   BILIRUBINUR negative 10/31/2019 1148   KETONESUR NEGATIVE 02/08/2020 0809   PROTEINUR NEGATIVE 02/08/2020 0809   UROBILINOGEN 0.2 10/31/2019 1148   UROBILINOGEN 0.2 02/05/2015 1550   NITRITE NEGATIVE 02/08/2020 0809   LEUKOCYTESUR NEGATIVE 02/08/2020 0809   Sepsis Labs Invalid input(s): PROCALCITONIN,  WBC,  LACTICIDVEN Microbiology No results found for this or any previous visit (from the past 240  hour(s)).   Time coordinating discharge: Over 30 minutes  SIGNED:   Nolberto Hanlon, MD  [facility] 02/22/2020, 12:16 PM Pager   If 7PM-7AM, please contact night-coverage [email or web address] Password [email or web address]

## 2020-02-22 NOTE — Progress Notes (Signed)
RN called [facility] and spoke with Leah,RN and gave her report on the patient she stated understanding. Pt waiting on [facility] transport for DC

## 2020-02-22 NOTE — Progress Notes (Signed)
Patient ID: Adam Clements, male   DOB: April 07, 1934, 85 y.o.   MRN: [id number] S: More awake and alert this morning.  No new complaints. O:BP 116/72 (BP Location: Left Arm)   Pulse 70   Temp 98.4 F (36.9 C) (Oral)   Resp 16   SpO2 97%   Intake/Output Summary (Last 24 hours) at 02/22/2020 1253 Last data filed at 02/21/2020 2106 Gross per 24 hour  Intake --  Output 500 ml  Net -500 ml   Intake/Output: I/O last 3 completed shifts: In: 360 [P.O.:360] Out: 1500 [Urine:1500]  Intake/Output this shift:  No intake/output data recorded. Weight change:  Gen: frail, elderly WM in NAD CVS: no rub Resp: cta Abd: +BS, soft, NT/ND Ext: no edema  Recent Labs  Lab 02/17/20 0414 02/18/20 0108 02/18/20 1558 02/19/20 0836 02/20/20 0507 02/21/20 0745 02/22/20 0324  NA 129* 128* 130* 130* 128* 127* 131*  K 4.0 4.0 4.3 4.2 4.1 3.6 4.3  CL 96* 95* 94* 97* 96* 93* 98  CO2 21* 24 25 26 25 26 22   GLUCOSE 92 102* 110* 108* 94 96 93  BUN 29* 31* 28* 22 25* 22 24*  CREATININE 1.12 1.14 1.16 1.16 1.17 1.08 1.11  ALBUMIN  --   --   --  2.8* 2.8* 2.9* 2.8*  CALCIUM 8.8* 8.7* 8.7* 8.8* 8.7* 8.7* 9.0  PHOS  --   --   --  3.3 3.0 3.0 3.4   Liver Function Tests: Recent Labs  Lab 02/20/20 0507 02/21/20 0745 02/22/20 0324  ALBUMIN 2.8* 2.9* 2.8*   No results for input(s): LIPASE, AMYLASE in the last 168 hours. No results for input(s): AMMONIA in the last 168 hours. CBC: Recent Labs  Lab 02/18/20 0108  WBC 8.7  HGB 10.3*  HCT 29.8*  MCV 92.3  PLT 253   Cardiac Enzymes: No results for input(s): CKTOTAL, CKMB, CKMBINDEX, TROPONINI in the last 168 hours. CBG: No results for input(s): GLUCAP in the last 168 hours.  Iron Studies: No results for input(s): IRON, TIBC, TRANSFERRIN, FERRITIN in the last 72 hours. Studies/Results: No results found. Marland Kitchen atomoxetine  20 mg Oral TID  . dipyridamole-aspirin  1 capsule Oral BID  . magnesium oxide  400 mg Oral Daily  . melatonin  3 mg Oral QHS  .  pravastatin  20 mg Oral q1800  . sodium chloride  2 g Oral TID WC  . traZODone  25 mg Oral Q2000    BMET    Component Value Date/Time   NA 131 (L) 02/22/2020 0324   K 4.3 02/22/2020 0324   CL 98 02/22/2020 0324   CO2 22 02/22/2020 0324   GLUCOSE 93 02/22/2020 0324   BUN 24 (H) 02/22/2020 0324   CREATININE 1.11 02/22/2020 0324   CREATININE 1.26 (H) 08/24/2019 1032   CREATININE 1.25 (H) 10/25/2015 0959   CALCIUM 9.0 02/22/2020 0324   GFRNONAA >60 02/22/2020 0324   GFRNONAA 52 (L) 08/24/2019 1032   GFRAA >60 08/24/2019 1032   CBC    Component Value Date/Time   WBC 8.7 02/18/2020 0108   RBC 3.23 (L) 02/18/2020 0108   HGB 10.3 (L) 02/18/2020 0108   HGB 10.4 (L) 08/24/2019 1032   HGB 9.6 (L) 02/22/2018 1606   HCT 29.8 (L) 02/18/2020 0108   HCT 29.2 (L) 02/22/2018 1606   PLT 253 02/18/2020 0108   PLT 204 08/24/2019 1032   PLT 254 02/22/2018 1606   MCV 92.3 02/18/2020 0108   MCV 96 02/22/2018  1606   MCH 31.9 02/18/2020 0108   MCHC 34.6 02/18/2020 0108   RDW 11.9 02/18/2020 0108   RDW 12.0 (L) 02/22/2018 1606   LYMPHSABS 0.7 02/07/2020 2049   MONOABS 0.6 02/07/2020 2049   EOSABS 0.1 02/07/2020 2049   BASOSABS 0.0 02/07/2020 2049      Assessment/Plan:  1. Acute on chronic hyponatremia- baseline sodium levels appear to range from 125-130 over the past 2 years. TSH and cortisol WNL. euvolemic on exam. Urine sodium inappropriately high at 80 and Uosm 650 consistent with SIADH, however he also has symptomatic orthostatic hypotension and is on strattera which has been associated with hyponatremia. Sodium has ranged 128-130 for the past 4 days and no history of N/V or HA.  1. Continue with fluid restriction and sodium chloride tabs 2. Hold off on another dose of tolvaptan (received 7.5 mg on 11/24 and again on 02/18/20).  3. Sodium improved to 131 4. Nothing further to add and will sign off.  Please call with questions or concerns. 5. Plan for pt to go home with  hospice. 2. Syncope with history of Neurogenic orthostatic hypotension- on straterra as an outpatient and follows with Dr. Caryl Comes. Has failed several therapies and has now progressed to inability to stand.  Agree with Palliative care consult and transition to hospice. 3. Nonischemic CMP 4. Grade 2 diastolic dysfunction 5. CKD stage IIIa- stable Donetta Potts, MD [facility] [phone]

## 2020-02-22 NOTE — TOC Transition Note (Addendum)
Transition of Care [facility]) - CM/SW Discharge Note   Patient Details  Name: Adam Clements MRN: [id number] Date of Birth: 1934-10-25  Transition of Care [facility]) CM/SW Contact:  Bethann Berkshire, [place] Phone Number: 02/22/2020, 10:16 AM   Clinical Narrative:     CSW confirmed with [facility] that insurance auth still good and pt can come today. Pt will need rapid covid swab.   1320: Covid negative; CSW spoke with pt daughter [name] and confirmed desire for palliative services following pt at [place]. CSW made referral to [facility] as [facility] is contracted with them.   Patient will DC to: [facility] Anticipated DC date: 02/22/20 Family notified: Alphonsa Overall daughter Transport by: Corey Harold   Per MD patient ready for DC to [place] . RN, patient, patient's family, and facility notified of DC. Discharge Summary and FL2 sent to facility. RN to call report prior to discharge ([phone]). DC packet on chart. Ambulance transport requested for patient.   CSW will sign off for now as social work intervention is no longer needed. Please consult [place] again if new needs arise.  Final next level of care: Skilled Nursing Facility Barriers to Discharge: No Barriers Identified   Patient Goals and CMS Choice Patient states their goals for this hospitalization and ongoing recovery are:: SNF for rehab CMS Medicare.gov Compare Post Acute Care list provided to:: Patient Choice offered to / list presented to : Patient, Adult Children  Discharge Placement              Patient chooses bed at:  ([facility]) Patient to be transferred to facility by: [facility] Name of family member notified: Alphonsa Overall Patient and family notified of of transfer: 02/22/20  Discharge Plan and Services                                     Social Determinants of Health (SDOH) Interventions     Readmission Risk Interventions No flowsheet data found.

## 2020-02-22 NOTE — Progress Notes (Addendum)
Palliative:  HPI: 85 y.o.malewithhistory of neurogenic orthostatic hypotension, nonischemic cardiomyopathy last EF measured was 35 to 40% with grade 2 diastolic dysfunction, supine hypertension, TIA, chronic anemia, chronic hyponatremia, chronic kidney disease stage III was brought to the ER after patient had at least 2 episodes of syncope at home.He was recently started on Strattera by his cardiologist for autonomic orthostatic hypotension. Patient is admitted for recurrent syncope. MRI head unremarkable.Patient had syncopal episodes twice during hospitalization.  I met today at [name] bedside. He is sleeping but awakens easily to voice. He seems more appropriate and oriented today. Daughter, Adam Clements, reports that he had a very good night, rested well, and awoke more oriented and they have had meaningful interactions this morning. He was a little restless and we agreed to give trazodone a little earlier in the night. No further concerns. She does report her sister, Adam Clements, is taking primary lead in setting up care along with their mother.   I called and spoke to [place]. We discussed plan from here for SNF rehab stay. We discussed care from rehab and she has been in contact with [facility] and [facility]. We discussed connecting with [facility] (palliative with [facility] [place]) at facility. We discussed poor prognosis with severe autonomic orthostatic hypotension and multisystem atrophy and that we have limited options to improve his functional status in these circumstances and this is why he would qualify for hospice support at home. We discussed the benefit of hospice care and I recommend to have hospice support along with private caregiver support from [place] to help assist patient and family at home.   All questions/concerns addressed. Emotional support provided. Discussed with Dr. Kurtis Bushman and CSW.   Exam: Easily awakens. Speech mumbled and difficult to understand  at times but appropriate responses. No distress. Breathing regular, unlabored. Abd soft.   Plan: - SNF rehab with palliative followed by hospice at home with private caregiver support.  - Continue trazodone 25 mg po but give at 2000 instead of 5427.   35 min  Vinie Sill, NP Palliative Medicine Team Pager [phone] (Please see [email or web address] for schedule) Team Phone [phone]    Greater than 50%  of this time was spent counseling and coordinating care related to the above assessment and plan

## 2020-02-22 NOTE — Progress Notes (Signed)
Manufacturing engineer [facility])  Hospital Liaison RN note         Notified by [facility] manager of patient/family request for [facility] Palliative services at [place] after discharge.            [facility] Palliative team will follow up with patient after discharge.         Please call with any hospice or palliative related questions.         Thank you for the opportunity to participate in this patient's care.     Chrislyn Edison Pace, BSN, RN [place] (listed on [place] under [facility]osp[facility])    [phone]

## 2020-02-22 NOTE — Progress Notes (Signed)
[facility] arrived to transport pt to [place]

## 2020-02-22 NOTE — Progress Notes (Signed)
Pt left in stable condition via stretcher with [facility] to [facility]

## 2020-02-23 ENCOUNTER — Other Ambulatory Visit: Payer: Self-pay | Admitting: Hematology and Oncology

## 2020-02-23 ENCOUNTER — Telehealth: Payer: Self-pay | Admitting: Internal Medicine

## 2020-02-23 ENCOUNTER — Ambulatory Visit: Payer: Medicare HMO | Admitting: Hematology and Oncology

## 2020-02-23 ENCOUNTER — Other Ambulatory Visit: Payer: Medicare HMO

## 2020-02-23 DIAGNOSIS — D472 Monoclonal gammopathy: Secondary | ICD-10-CM

## 2020-02-23 NOTE — Telephone Encounter (Signed)
Patient's daughter [name] calling to follow up on whether the FMLA paperwork she faxed yesterday was received.

## 2020-02-23 NOTE — Telephone Encounter (Signed)
Medical Records Received FMLA paperwork front the MR box from the [facility]. Called patient to inform him of the forms. His Wife DPR stated he is in hospital and will call MR back about Release forms,and payment. jc 02/23/20

## 2020-02-24 ENCOUNTER — Telehealth: Payer: Self-pay

## 2020-02-24 NOTE — Telephone Encounter (Signed)
Fine to enroll patient in [facility] - what do I need to do for this?

## 2020-02-24 NOTE — Telephone Encounter (Signed)
[name] RN with [facility] home based palliative care with [employer] left v/m that they received a referral from [facility] hospital at pts discharge; pt has a short stop over for rehab at [facility]; only 5 days covered for skilled nursing care; pt should be transitioning home next wk and family is requesting care connections support and wants pt enrolled in care connections services to have nurse and social worker come out for eval when pt comes home. JoAnn request order from [name] to enroll pt in [facility] and wants to know if [name] would also sign [facility] orders.

## 2020-02-27 DIAGNOSIS — E871 Hypo-osmolality and hyponatremia: Secondary | ICD-10-CM | POA: Diagnosis not present

## 2020-02-27 DIAGNOSIS — I951 Orthostatic hypotension: Secondary | ICD-10-CM | POA: Diagnosis not present

## 2020-02-27 DIAGNOSIS — N183 Chronic kidney disease, stage 3 unspecified: Secondary | ICD-10-CM | POA: Diagnosis not present

## 2020-02-27 DIAGNOSIS — G459 Transient cerebral ischemic attack, unspecified: Secondary | ICD-10-CM | POA: Diagnosis not present

## 2020-02-27 DIAGNOSIS — R55 Syncope and collapse: Secondary | ICD-10-CM | POA: Diagnosis not present

## 2020-02-27 DIAGNOSIS — G903 Multi-system degeneration of the autonomic nervous system: Secondary | ICD-10-CM | POA: Diagnosis not present

## 2020-02-27 DIAGNOSIS — D649 Anemia, unspecified: Secondary | ICD-10-CM | POA: Diagnosis not present

## 2020-02-27 DIAGNOSIS — I428 Other cardiomyopathies: Secondary | ICD-10-CM | POA: Diagnosis not present

## 2020-02-27 NOTE — Telephone Encounter (Signed)
Lvm asking JoAnn to call back.  Need to relay Dr. Synthia Innocent message.

## 2020-02-28 ENCOUNTER — Other Ambulatory Visit: Payer: Self-pay

## 2020-02-28 ENCOUNTER — Non-Acute Institutional Stay: Payer: Medicare HMO | Admitting: Hospice

## 2020-02-28 DIAGNOSIS — G903 Multi-system degeneration of the autonomic nervous system: Secondary | ICD-10-CM

## 2020-02-28 DIAGNOSIS — Z515 Encounter for palliative care: Secondary | ICD-10-CM | POA: Diagnosis not present

## 2020-02-28 NOTE — Progress Notes (Signed)
[facility] Consult Note Telephone: [phone]  Fax: [phone]  PATIENT NAME: Adam Clements [address] [place] [address] [phone] (home)  DOB: September 15, 1934 MRN: [id number]  PRIMARY CARE PROVIDER:    Ria Bush, MD,  [place] [address] [phone]  REFERRING PROVIDER:   Ria Bush, MD [address] [place],  [place] [address] [phone]  RESPONSIBLE PARTY:  Self Adam Clements Extended Emergency Contact Information Primary Emergency Contact: Adam Clements Address: [address]          [place], [place] [address] Johnnette Litter of [place] Phone: [phone] Mobile Phone: [phone] Relation: Spouse Secondary Emergency Contact: Adam Clements Mobile Phone: [phone] Relation: Daughter  Adam Clements - daughter [phone]  I met face to face with patient and family in home/facility.  RECOMMENDATIONS/PLAN:    Visit at the request of  Adam Clements for palliative consult. Visit consisted of building trust and discussions on Palliative Medicine as specialized medical care for people living with serious illness, aimed at facilitating better quality of life through symptoms relief, assisting with advance care plan and establishing complex decision making. Adam Clements is present during visit Discussion on the difference between Palliative and Hospice care. Palliative care and hospice have similar goals of managing symptoms, promoting comfort, improving quality of life, and maintaining a person's dignity. However, palliative care may be offered during any phase of a serious illness, while hospice care is usually offered when a person is expected to live for 6 months or less.  Family considering hospice service in the future. NP contact information given for smooth transition to Hospice at the appropriate time.  Adam Clements reached out requesting a meeting to further discuss  benefits of Hospice. 03/06/2020 at 12.30pm suggested, to be confirmed.  Advance Care Planning/CODE STATUS: Ramifications and implications of CODE STATUS discussed.  Patient affirmed he is a DO NOT RESUSCITATE.   GOALS OF CARE: Goals of care include to maximize quality of life and symptom management.  Patient has a signed medical orders for scope of treatment form.  Selections include do not attempt resuscitation, IV fluids for defined trial.,  No feeding tube, determine use of antibiotics when infection occurs.  Adam Clements signed DNR and MOST forms. These were handed over to LPN Adam Clements for facility records.   Follow up Palliative Care Visit: Palliative care will continue to follow for goals of care clarification and symptom management.   Symptom Management:  Orthostatic Hypotension: Continue orthostatic blood pressure check as ordered.  Continue atomoxetine as ordered.  Slow position changes. Falls: Falls related to syncope/orthostatic hypotension. Continue PT/OT.  Fall precautions in place.  NP oriented patient to his call bell patient.  Patient is followed by a Cardiologist, Neurologist.  Rapid eye disorder: managed with Trazadone and Melatonin. Patient sleeping better.   Palliative will continue to monitor for symptom management/decline and make recommendations as needed.  Family /Caregiver/Community Supports: Patient in SNF for rehab.  Patient has wife and 3 daughters who are involved in his care.  Strong family support system identified.  I spent 1 hour and 20 minutes providing this initial consultation; time includes time spent with patient/family, chart review, provider coordination,  and documentation. More than 50% of the time in this consultation was spent on counseling patient and coordinating communication.   CHIEF COMPLAIN/HISTORY OF PRESENT ILLNESS:  Adam Clements is a 85 y.o. male with multiple medical problems including orthostatic hypotension, rapid eye movement sleep disorder.  History obtained  from review of EMR, discussion with primary team/primary RN and interview with patient and his daughter at Adam Clements.   Palliative Care was asked to follow this patient by consultation request of Adam Bush, MD to help address advance care planning and complex decision making. Thank you for the opportunity to participate in the care of Adam Clements: DNR  PPS:  50%  HOSPICE ELIGIBILITY/DIAGNOSIS: TBD  PAST MEDICAL HISTORY:  Past Medical History:  Diagnosis Date  . Allergic rhinitis   . Arthritis    Gout  . BPH (benign prostatic hypertrophy)   . Bradycardia   . CAD (coronary artery disease)    nonobstructive  . Chronic anemia 2013   h/o IDA, did not tolerate oral iron, latest normal iron studies, B12, folate  . Diverticulosis of colon    conoscopy 12/1993 (Dr. Amedeo Plenty) S/G divertics//colonoscopy L&R divertics (Dr. Amedeo Plenty) 11/08/2004  . Dizziness   . Gout   . HTN (hypertension)   . Memory loss   . Mitral regurgitation    Pulmonic regurgitation, with marked LA enlargement  . Nonischemic cardiomyopathy (HCC)    EF 40%  . Orthostatic hypotension   . PVC (premature ventricular contraction)   . PVD (peripheral vascular disease) ([place])    followed by Dr Trula Slade, treating medically (pletal and aspirin)  . RBBB longstanding  . Squamous cell carcinoma in situ of skin 2015   L ear  . Syncope     SOCIAL HX:  Social History   Tobacco Use  . Smoking status: Former Smoker    Packs/day: 2.00    Types: Cigarettes    Quit date: 11/10/1982    Years since quitting: 37.3  . Smokeless tobacco: Former Network engineer Use Topics  . Alcohol use: Yes    Alcohol/week: 7.0 standard drinks    Types: 7 Glasses of wine per week    Comment: 1 glass of wine daily. History of heavy drinking prior to 2005.   FAMILY HX:  Family History  Problem Relation Age of Onset  . Heart disease Mother 8       Natural causes with CHF  . Hypertension Mother   .  Hypertension Father   . Stroke Father        cerebral hemorrhage  . Hyperlipidemia Brother   . Heart disease Brother        before age 9  . Hypertension Brother   . Heart attack Brother   . Hypertension Other     ALLERGIES:  Allergies  Allergen Reactions  . Atorvastatin Other (See Comments)    Memory trouble, confusion, fatigue  . Pletal [Cilostazol]      PERTINENT MEDICATIONS:  Outpatient Encounter Medications as of 02/28/2020  Medication Sig  . atomoxetine (STRATTERA) 10 MG capsule Take 2 capsules (20 mg total) by mouth 3 (three) times daily.  . cholecalciferol (VITAMIN D) 1000 units tablet Take 1,000 Units by mouth daily.  Marland Kitchen dipyridamole-aspirin (AGGRENOX) 200-25 MG 12hr capsule TAKE 1 CAPSULE BY MOUTH TWICE A DAY (Patient taking differently: Take 1 capsule by mouth 2 (two) times daily. )  . magnesium oxide (MAG-OX) 400 (241.3 Mg) MG tablet Take 1 tablet (400 mg total) by mouth daily.  . melatonin 3 MG TABS tablet Take 1 tablet (3 mg total) by mouth at bedtime.  . pravastatin (PRAVACHOL) 20 MG tablet TAKE 1 TABLET(20 MG) BY MOUTH DAILY (Patient taking differently: Take 20 mg by mouth daily. )  . sodium chloride 1 g tablet  Take 2 tablets (2 g total) by mouth 3 (three) times daily with meals.  . traZODone (DESYREL) 50 MG tablet Take 0.5 tablets (25 mg total) by mouth daily at 8 pm.  . vitamin B-12 (CYANOCOBALAMIN) 500 MCG tablet Take 1 tablet (500 mcg total) by mouth every Monday, Wednesday, and Friday.   No facility-administered encounter medications on file as of 02/28/2020.   Physical Exam/ROS General: NAD, cooperative Cardiovascular: regular rate and rhythm; denies chest pain Pulmonary: clear ant /post fields Abdomen: soft, nontender, + bowel sounds GU: no suprapubic tenderness Extremities: no edema, no joint deformities Skin: no rashes to visible skin Neurological: Weakness but otherwise nonfocal  Note:  Portions of this note were generated with Theatre manager. Dictation errors may occur despite attempts at proofreading.   Teodoro Spray, NP

## 2020-02-29 DIAGNOSIS — R55 Syncope and collapse: Secondary | ICD-10-CM | POA: Diagnosis not present

## 2020-02-29 NOTE — Telephone Encounter (Signed)
Spoke with Adam Clements asking notifying her Dr. Darnell Level is agreeing to enroll pt in [facility].  States they just needed a verbal order so she will document that and inform JoAnn.  If anything else is needed they will contact us or fax info.

## 2020-02-29 NOTE — Telephone Encounter (Signed)
Adam Clements did inform Adam Clements's wife that MR did received the [facility] 02/23/20 and that there  would be a fee and we need Adam Clements to fill out a release form. No answer back. jc 02/29/20

## 2020-03-01 ENCOUNTER — Telehealth: Payer: Self-pay | Admitting: Hospice

## 2020-03-02 ENCOUNTER — Telehealth: Payer: Self-pay | Admitting: Family Medicine

## 2020-03-02 DIAGNOSIS — G459 Transient cerebral ischemic attack, unspecified: Secondary | ICD-10-CM | POA: Diagnosis not present

## 2020-03-02 DIAGNOSIS — D649 Anemia, unspecified: Secondary | ICD-10-CM | POA: Diagnosis not present

## 2020-03-02 DIAGNOSIS — R55 Syncope and collapse: Secondary | ICD-10-CM | POA: Diagnosis not present

## 2020-03-02 DIAGNOSIS — E871 Hypo-osmolality and hyponatremia: Secondary | ICD-10-CM | POA: Diagnosis not present

## 2020-03-02 DIAGNOSIS — G903 Multi-system degeneration of the autonomic nervous system: Secondary | ICD-10-CM | POA: Diagnosis not present

## 2020-03-02 DIAGNOSIS — I951 Orthostatic hypotension: Secondary | ICD-10-CM | POA: Diagnosis not present

## 2020-03-02 DIAGNOSIS — N183 Chronic kidney disease, stage 3 unspecified: Secondary | ICD-10-CM | POA: Diagnosis not present

## 2020-03-02 DIAGNOSIS — I428 Other cardiomyopathies: Secondary | ICD-10-CM | POA: Diagnosis not present

## 2020-03-02 NOTE — Telephone Encounter (Signed)
Rep called stated that he has a declining health and is bed bounded due to his condition and is being transferred from [facility] home to the home and then being enrolled in the [facility] [place] [facility] [place].

## 2020-03-04 DIAGNOSIS — R55 Syncope and collapse: Secondary | ICD-10-CM | POA: Diagnosis not present

## 2020-03-04 DIAGNOSIS — G908 Other disorders of autonomic nervous system: Secondary | ICD-10-CM | POA: Diagnosis not present

## 2020-03-04 DIAGNOSIS — I739 Peripheral vascular disease, unspecified: Secondary | ICD-10-CM | POA: Diagnosis not present

## 2020-03-04 DIAGNOSIS — L89522 Pressure ulcer of left ankle, stage 2: Secondary | ICD-10-CM | POA: Diagnosis not present

## 2020-03-04 DIAGNOSIS — I4891 Unspecified atrial fibrillation: Secondary | ICD-10-CM | POA: Diagnosis not present

## 2020-03-04 DIAGNOSIS — L89512 Pressure ulcer of right ankle, stage 2: Secondary | ICD-10-CM | POA: Diagnosis not present

## 2020-03-04 DIAGNOSIS — N183 Chronic kidney disease, stage 3 unspecified: Secondary | ICD-10-CM | POA: Diagnosis not present

## 2020-03-04 DIAGNOSIS — I951 Orthostatic hypotension: Secondary | ICD-10-CM | POA: Diagnosis not present

## 2020-03-04 DIAGNOSIS — I5022 Chronic systolic (congestive) heart failure: Secondary | ICD-10-CM | POA: Diagnosis not present

## 2020-03-04 DIAGNOSIS — I428 Other cardiomyopathies: Secondary | ICD-10-CM | POA: Diagnosis not present

## 2020-03-04 DIAGNOSIS — G903 Multi-system degeneration of the autonomic nervous system: Secondary | ICD-10-CM | POA: Diagnosis not present

## 2020-03-04 DIAGNOSIS — Z8673 Personal history of transient ischemic attack (TIA), and cerebral infarction without residual deficits: Secondary | ICD-10-CM | POA: Diagnosis not present

## 2020-03-04 DIAGNOSIS — R634 Abnormal weight loss: Secondary | ICD-10-CM | POA: Diagnosis not present

## 2020-03-04 DIAGNOSIS — E441 Mild protein-calorie malnutrition: Secondary | ICD-10-CM | POA: Diagnosis not present

## 2020-03-04 DIAGNOSIS — R1312 Dysphagia, oropharyngeal phase: Secondary | ICD-10-CM | POA: Diagnosis not present

## 2020-03-05 DIAGNOSIS — I5022 Chronic systolic (congestive) heart failure: Secondary | ICD-10-CM | POA: Diagnosis not present

## 2020-03-05 DIAGNOSIS — G903 Multi-system degeneration of the autonomic nervous system: Secondary | ICD-10-CM | POA: Diagnosis not present

## 2020-03-05 DIAGNOSIS — R55 Syncope and collapse: Secondary | ICD-10-CM | POA: Diagnosis not present

## 2020-03-05 DIAGNOSIS — G908 Other disorders of autonomic nervous system: Secondary | ICD-10-CM | POA: Diagnosis not present

## 2020-03-05 DIAGNOSIS — I951 Orthostatic hypotension: Secondary | ICD-10-CM | POA: Diagnosis not present

## 2020-03-05 DIAGNOSIS — I4891 Unspecified atrial fibrillation: Secondary | ICD-10-CM | POA: Diagnosis not present

## 2020-03-05 NOTE — Telephone Encounter (Signed)
Patient's daughter Adam Clements needed a tour of Hospice home in [place]. After telephone conversation with her discussing possible hospice service in the future for patient, she was provided contact information for [facility]ospice [facility] so she can receive more information and possibly a tour of the facility. She expressed appreciation for the information/contact. She verbalized understanding to call with any concerns.

## 2020-03-06 DIAGNOSIS — R55 Syncope and collapse: Secondary | ICD-10-CM | POA: Diagnosis not present

## 2020-03-06 DIAGNOSIS — I4891 Unspecified atrial fibrillation: Secondary | ICD-10-CM | POA: Diagnosis not present

## 2020-03-06 DIAGNOSIS — G908 Other disorders of autonomic nervous system: Secondary | ICD-10-CM | POA: Diagnosis not present

## 2020-03-06 DIAGNOSIS — I951 Orthostatic hypotension: Secondary | ICD-10-CM | POA: Diagnosis not present

## 2020-03-06 DIAGNOSIS — G903 Multi-system degeneration of the autonomic nervous system: Secondary | ICD-10-CM | POA: Diagnosis not present

## 2020-03-06 DIAGNOSIS — I5022 Chronic systolic (congestive) heart failure: Secondary | ICD-10-CM | POA: Diagnosis not present

## 2020-03-08 DIAGNOSIS — I4891 Unspecified atrial fibrillation: Secondary | ICD-10-CM | POA: Diagnosis not present

## 2020-03-08 DIAGNOSIS — G903 Multi-system degeneration of the autonomic nervous system: Secondary | ICD-10-CM | POA: Diagnosis not present

## 2020-03-08 DIAGNOSIS — G908 Other disorders of autonomic nervous system: Secondary | ICD-10-CM | POA: Diagnosis not present

## 2020-03-08 DIAGNOSIS — R55 Syncope and collapse: Secondary | ICD-10-CM | POA: Diagnosis not present

## 2020-03-08 DIAGNOSIS — I5022 Chronic systolic (congestive) heart failure: Secondary | ICD-10-CM | POA: Diagnosis not present

## 2020-03-08 DIAGNOSIS — I951 Orthostatic hypotension: Secondary | ICD-10-CM | POA: Diagnosis not present

## 2020-03-09 ENCOUNTER — Other Ambulatory Visit: Payer: Self-pay

## 2020-03-09 ENCOUNTER — Inpatient Hospital Stay: Payer: Medicare HMO | Admitting: Family Medicine

## 2020-03-09 ENCOUNTER — Telehealth: Payer: Self-pay | Admitting: Internal Medicine

## 2020-03-09 ENCOUNTER — Ambulatory Visit (INDEPENDENT_AMBULATORY_CARE_PROVIDER_SITE_OTHER): Payer: Medicare HMO | Admitting: Family Medicine

## 2020-03-09 ENCOUNTER — Encounter: Payer: Self-pay | Admitting: Family Medicine

## 2020-03-09 VITALS — BP 140/70 | HR 71 | Temp 97.8°F | Ht 67.25 in | Wt 139.1 lb

## 2020-03-09 DIAGNOSIS — G903 Multi-system degeneration of the autonomic nervous system: Secondary | ICD-10-CM

## 2020-03-09 DIAGNOSIS — G4752 REM sleep behavior disorder: Secondary | ICD-10-CM | POA: Diagnosis not present

## 2020-03-09 DIAGNOSIS — Z66 Do not resuscitate: Secondary | ICD-10-CM

## 2020-03-09 DIAGNOSIS — I4891 Unspecified atrial fibrillation: Secondary | ICD-10-CM | POA: Diagnosis not present

## 2020-03-09 DIAGNOSIS — H6123 Impacted cerumen, bilateral: Secondary | ICD-10-CM

## 2020-03-09 DIAGNOSIS — R55 Syncope and collapse: Secondary | ICD-10-CM | POA: Diagnosis not present

## 2020-03-09 DIAGNOSIS — I951 Orthostatic hypotension: Secondary | ICD-10-CM

## 2020-03-09 DIAGNOSIS — Z515 Encounter for palliative care: Secondary | ICD-10-CM

## 2020-03-09 DIAGNOSIS — E871 Hypo-osmolality and hyponatremia: Secondary | ICD-10-CM

## 2020-03-09 DIAGNOSIS — Z7189 Other specified counseling: Secondary | ICD-10-CM

## 2020-03-09 DIAGNOSIS — G908 Other disorders of autonomic nervous system: Secondary | ICD-10-CM | POA: Diagnosis not present

## 2020-03-09 DIAGNOSIS — I5022 Chronic systolic (congestive) heart failure: Secondary | ICD-10-CM | POA: Diagnosis not present

## 2020-03-09 NOTE — Progress Notes (Signed)
Patient ID: Adam Clements, male    DOB: Aug 13, 1934, 85 y.o.   MRN: [id number]  This visit was conducted in person.  BP 140/70 (BP Location: Right Arm, Patient Position: Sitting, Cuff Size: Normal)   Pulse 71   Temp 97.8 F (36.6 C) (Temporal)   Ht 5' 7.25" (1.708 m)   Wt 139 lb 1 oz (63.1 kg)   SpO2 96%   BMI 21.62 kg/m   No data found.  CC: hosp f/u visit  Subjective:   HPI: Adam Clements is a 85 y.o. male presenting on 03/09/2020 for Hospitalization Follow-up (Here for PT rehab f/u.  Pt accompanied by daughter, Adam Clements- temp 97.7.) and Form Completion (Requests [place] [employer] form.  ) Here with daughter Adam Clements who lives in [place].   Recent hospitalization for recurrent syncope while at home. MRI head unremarkable. Progressive worsening noted. Strattera was increased to 20mg  TID. Daughter states he was actually diagnosed with MSA by neurology. Diagnosed also with OSA and REM sleep disorder. For REM sleep disorder started on melatonin 3mg .   Adam Clements was changed to midodrine He's been on NaCl 6gm/day and increased water intake.  Daughter also notes improving sleep has helped   Last blood work was last week at [facility], Na 128, other labs stable.  Since home he's doing better. Daughter has taken over medications and ensuring he's taking these regularly, as well as staying hydrated. Daughter is planning to return to [place] soon.   Saw [facility] this past week - bad ear wax to left ear unable to be removed. Requests ENT eval if unable to be removed here.   DNR active at home.  They request MOST form filled out - this was done.   Admit date: 02/07/2020 Discharge date: 02/22/2020 Discharged home: 03/03/2020 TCM hospital f/u phone call not completed.   Admitted From: home Disposition: [facility]  Recommendations for Outpatient Follow-up:  1. Follow up with PCP in 1 week 2. Please obtain BMP/CBC in one week 3. Follow up with cardiology in one  week 4. Follow up with neurology in one week 5. Follow up with nephrology in one week  Home Health:Upon discharge, patient will go home with hospice  Consultants:   Cardiology  Nephrology  Neurology  Palliative care  Discharge Diagnoses:  Principal Problem:   Syncope and collapse Active Problems:   NICM (nonischemic cardiomyopathy) ([place])   Goals of care, counseling/discussion   Autonomic failure   Near syncope   Palliative care by specialist   DNR (do not resuscitate)     Relevant past medical, surgical, family and social history reviewed and updated as indicated. Interim medical history since our last visit reviewed. Allergies and medications reviewed and updated. Outpatient Medications Prior to Visit  Medication Sig Dispense Refill  . cholecalciferol (VITAMIN D) 1000 units tablet Take 1,000 Units by mouth daily.    Marland Kitchen dipyridamole-aspirin (AGGRENOX) 200-25 MG 12hr capsule TAKE 1 CAPSULE BY MOUTH TWICE A DAY (Patient taking differently: Take 1 capsule by mouth 2 (two) times daily.) 60 capsule 11  . haloperidol (HALDOL) 1 MG tablet Take 1-2 mg by mouth every 4 (four) hours as needed.    . magnesium oxide (MAG-OX) 400 (241.3 Mg) MG tablet Take 1 tablet (400 mg total) by mouth daily.    . melatonin 3 MG TABS tablet Take 1 tablet (3 mg total) by mouth at bedtime. (Patient taking differently: Take 3 mg by mouth at bedtime. Takes 2 tablets daily)  0  . midodrine (PROAMATINE) 10 MG tablet Take 10 mg by mouth 3 (three) times daily.    . Potassium 99 MG TABS Take by mouth daily.    . sodium chloride 1 g tablet Take 2 tablets (2 g total) by mouth 3 (three) times daily with meals.    . traZODone (DESYREL) 50 MG tablet Take 0.5 tablets (25 mg total) by mouth daily at 8 pm. (Patient taking differently: Take 50 mg by mouth at bedtime.)    . vitamin B-12 (CYANOCOBALAMIN) 500 MCG tablet Take 1 tablet (500 mcg total) by mouth every Monday, Wednesday, and Friday.    Marland Kitchen atomoxetine (STRATTERA)  10 MG capsule Take 2 capsules (20 mg total) by mouth 3 (three) times daily.    . pravastatin (PRAVACHOL) 20 MG tablet TAKE 1 TABLET(20 MG) BY MOUTH DAILY (Patient taking differently: Take 20 mg by mouth daily. ) 90 tablet 3   No facility-administered medications prior to visit.     Per HPI unless specifically indicated in ROS section below Review of Systems Objective:  BP 140/70 (BP Location: Right Arm, Patient Position: Sitting, Cuff Size: Normal)   Pulse 71   Temp 97.8 F (36.6 C) (Temporal)   Ht 5' 7.25" (1.708 m)   Wt 139 lb 1 oz (63.1 kg)   SpO2 96%   BMI 21.62 kg/m   Wt Readings from Last 3 Encounters:  03/09/20 139 lb 1 oz (63.1 kg)  01/06/20 145 lb 9 oz (66 kg)  10/31/19 145 lb 7 oz (66 kg)      Physical Exam Vitals and nursing note reviewed.  Constitutional:      Appearance: Normal appearance. He is not ill-appearing.     Comments: Sitting in wheelchair  Cardiovascular:     Rate and Rhythm: Normal rate and regular rhythm.     Pulses: Normal pulses.     Heart sounds: Normal heart sounds. No murmur heard.   Pulmonary:     Effort: Pulmonary effort is normal. No respiratory distress.     Breath sounds: Normal breath sounds. No wheezing, rhonchi or rales.  Musculoskeletal:        General: Normal range of motion.     Right lower leg: No edema.     Left lower leg: No edema.  Skin:    General: Skin is warm and dry.     Findings: No rash.  Neurological:     Mental Status: He is alert.  Psychiatric:        Mood and Affect: Mood normal.        Behavior: Behavior normal.       Assessment & Plan:  This visit occurred during the SARS-CoV-2 public health emergency.  Safety protocols were in place, including screening questions prior to the visit, additional usage of staff PPE, and extensive cleaning of exam room while observing appropriate contact time as indicated for disinfecting solutions.   Patient's wife Adam Clements sees Dr Jenny Reichmann in [place] - this is  closer for them than our office at [facility]. Due to pt's progessive difficulty with mobility and transportation, they ask about transferring care to [place] which is ok with me - I will touch base with Dr Jenny Reichmann about this.  Problem List Items Addressed This Visit    Syncope due to orthostatic hypotension    Recurrent episodes led to hospitalization with subsequent SNF stay, now back at home.       Relevant Orders   Ambulatory referral to Neurology  REM sleep behavior disorder    ?of this during hospitalization.  Daughter describes longstanding trouble with movements in his sleep leading wife and pt to use separate rooms to sleep for 20+ yrs.  Pt currently taking trazodone 50mg  nightly and haldol 1mg  nightly with significant improvement in sleep.  Consider transition from haldol to benzo if truly REM sleep disorder.  Daughter also mentions OSA dx while hospitalized - I don't see mention of this in hospital records.       Relevant Orders   Ambulatory referral to Neurology   Recurrent syncope    ?new dx multiple system atrophy - daughter mentions this was diagnosed while hospitalized. I reviewed hospital records and specifically neurology consult note from 02/10/2200 (Dr Cheral Marker) - again don't see mention of this formal diagnosis, however very likely given current symptoms so will refer to neurology for formal evaluation/input. Pt/daughter agree.  Now largely confined to wheelchair which he conducts on his own - with significant improvement noted.       Relevant Orders   Ambulatory referral to Neurology   Neurogenic orthostatic hypotension ([place]) - Primary    Recent concern for MSA diagnosis - see above.  Discharged on straterra 20mg  TID, hospice did not cover this so now on midodrine 10mg  TID - need to tolerate supine hypertension due to marked orthostatic hypotension.  Reviewing EMR, he was previously on midodrine for a few months 2019, unclear why transition to [place], but he  was later transitioned to [place] due to unaffordability of Northera. Also previously tried on Florinef.  Reviewed importance of adherence to daily increased water intake.       Relevant Medications   midodrine (PROAMATINE) 10 MG tablet   Other Relevant Orders   Ambulatory referral to Neurology   Hospice care patient    Currently enrolled in hospice.       Hearing loss of both ears due to cerumen impaction    Persistent cerumen impaction to L ear, along with evidence of trauma to ear canal - pt has been using qtips - advised stop this. Did not attempt irrigation/disimpaction today. Will refer to ENT for cerumen disimpaction.       Relevant Orders   Ambulatory referral to ENT   DNR (do not resuscitate)    Reviewed with patient.      Chronic hyponatremia    S/p renal consult while hospitalized - ?SIADH vs contribution from [place].  Now on NaCl 2gm TID.       Advanced care planning/counseling discussion    MOST form filled out. DNR, ok with limited intervention for support including hospitalization but would not want intensive therapy including ICU stay. [place] with IVF, abx, defined trial of tube feeds. MOST form will be scanned.           No orders of the defined types were placed in this encounter.  Orders Placed This Encounter  Procedures  . Ambulatory referral to ENT    Referral Priority:   Routine    Referral Type:   Consultation    Referral Reason:   Specialty Services Required    Requested Specialty:   Otolaryngology    Number of Visits Requested:   1  . Ambulatory referral to Neurology    Referral Priority:   Routine    Referral Type:   Consultation    Referral Reason:   Specialty Services Required    Requested Specialty:   Neurology    Number of Visits Requested:   1  Patient Instructions  We will refer you to [facility] ENT for left ear cleaning I would like to refer you to [facility] neurology for further evaluation of MSA.  I will touch base with Dr  Jenny Reichmann about transfer of care.  Good to see you today.   Follow up plan: Return if symptoms worsen or fail to improve.  Ria Bush, MD

## 2020-03-09 NOTE — Telephone Encounter (Signed)
    Pt c/o medication issue:  1. Name of Medication: northera 100 mg  2. How are you currently taking this medication (dosage and times per day)?   3. Are you having a reaction (difficulty breathing--STAT)?   4. What is your medication issue? Kayla with cover [employer] med[employer] calling, she is asking if we need help submitting prior auth for this med. She gave  ref# [id number]

## 2020-03-09 NOTE — Patient Instructions (Addendum)
We will refer you to [facility] ENT for left ear cleaning I would like to refer you to [facility] neurology for further evaluation of MSA.  I will touch base with Dr Jenny Reichmann about transfer of care.  Good to see you today.

## 2020-03-10 ENCOUNTER — Telehealth: Payer: Self-pay | Admitting: Family Medicine

## 2020-03-10 ENCOUNTER — Encounter: Payer: Self-pay | Admitting: Family Medicine

## 2020-03-10 DIAGNOSIS — H6123 Impacted cerumen, bilateral: Secondary | ICD-10-CM | POA: Insufficient documentation

## 2020-03-10 DIAGNOSIS — G4752 REM sleep behavior disorder: Secondary | ICD-10-CM | POA: Insufficient documentation

## 2020-03-10 NOTE — Assessment & Plan Note (Addendum)
S/p renal consult while hospitalized - ?SIADH vs contribution from [place].  Now on NaCl 2gm TID.

## 2020-03-10 NOTE — Assessment & Plan Note (Signed)
Reviewed with patient

## 2020-03-10 NOTE — Assessment & Plan Note (Addendum)
?  new dx multiple system atrophy - daughter mentions this was diagnosed while hospitalized. I reviewed hospital records and specifically neurology consult note from 02/10/2200 (Dr Cheral Marker) - again don't see mention of this formal diagnosis, however very likely given current symptoms so will refer to neurology for formal evaluation/input. Pt/daughter agree.  Now largely confined to wheelchair which he conducts on his own - with significant improvement noted.

## 2020-03-10 NOTE — Assessment & Plan Note (Addendum)
MOST form filled out. DNR, ok with limited intervention for support including hospitalization but would not want intensive therapy including ICU stay. [place] with IVF, abx, defined trial of tube feeds. MOST form will be scanned.

## 2020-03-10 NOTE — Assessment & Plan Note (Signed)
Recurrent episodes led to hospitalization with subsequent SNF stay, now back at home.

## 2020-03-10 NOTE — Assessment & Plan Note (Addendum)
Recent concern for MSA diagnosis - see above.  Discharged on straterra 20mg  TID, hospice did not cover this so now on midodrine 10mg  TID - need to tolerate supine hypertension due to marked orthostatic hypotension.  Reviewing EMR, he was previously on midodrine for a few months 2019, unclear why transition to [place], but he was later transitioned to [place] due to unaffordability of Northera. Also previously tried on Florinef.  Reviewed importance of adherence to daily increased water intake.

## 2020-03-10 NOTE — Assessment & Plan Note (Signed)
Currently enrolled in hospice.

## 2020-03-10 NOTE — Telephone Encounter (Signed)
Opened in error

## 2020-03-10 NOTE — Assessment & Plan Note (Signed)
Persistent cerumen impaction to L ear, along with evidence of trauma to ear canal - pt has been using qtips - advised stop this. Did not attempt irrigation/disimpaction today. Will refer to ENT for cerumen disimpaction.

## 2020-03-10 NOTE — Assessment & Plan Note (Addendum)
?  of this during hospitalization.  Daughter describes longstanding trouble with movements in his sleep leading wife and pt to use separate rooms to sleep for 20+ yrs.  Pt currently taking trazodone 50mg  nightly and haldol 1mg  nightly with significant improvement in sleep.  Consider transition from haldol to benzo if truly REM sleep disorder.  Daughter also mentions OSA dx while hospitalized - I don't see mention of this in hospital records.

## 2020-03-12 ENCOUNTER — Telehealth: Payer: Self-pay | Admitting: Internal Medicine

## 2020-03-12 ENCOUNTER — Encounter: Payer: Self-pay | Admitting: Neurology

## 2020-03-12 DIAGNOSIS — I951 Orthostatic hypotension: Secondary | ICD-10-CM | POA: Diagnosis not present

## 2020-03-12 DIAGNOSIS — R55 Syncope and collapse: Secondary | ICD-10-CM | POA: Diagnosis not present

## 2020-03-12 DIAGNOSIS — I5022 Chronic systolic (congestive) heart failure: Secondary | ICD-10-CM | POA: Diagnosis not present

## 2020-03-12 DIAGNOSIS — G903 Multi-system degeneration of the autonomic nervous system: Secondary | ICD-10-CM | POA: Diagnosis not present

## 2020-03-12 DIAGNOSIS — I4891 Unspecified atrial fibrillation: Secondary | ICD-10-CM | POA: Diagnosis not present

## 2020-03-12 DIAGNOSIS — G908 Other disorders of autonomic nervous system: Secondary | ICD-10-CM | POA: Diagnosis not present

## 2020-03-12 NOTE — Telephone Encounter (Signed)
I received a staff message from [place] requesting me to change PCP to him from Dr. Danise Mina.  LVM for patient to call the office and set up a TOC appointment with Dr.John.

## 2020-03-12 NOTE — Telephone Encounter (Signed)
**Note De-Identified  Obfuscation** Pt is no longer taking Northera. I have archived this request in covermymeds.

## 2020-03-12 NOTE — Telephone Encounter (Signed)
Noted. Updated these notes in both referrals for the patient in the chart.

## 2020-03-12 NOTE — Telephone Encounter (Signed)
Patient's daughter called in stating it is best to contact her for father's referral and other things. Stated she is helping handle care since recent hospitalization. Please advise. Will update DPR when back in office.

## 2020-03-12 NOTE — Telephone Encounter (Signed)
Patient seen on Friday

## 2020-03-13 DIAGNOSIS — G908 Other disorders of autonomic nervous system: Secondary | ICD-10-CM | POA: Diagnosis not present

## 2020-03-13 DIAGNOSIS — I5022 Chronic systolic (congestive) heart failure: Secondary | ICD-10-CM | POA: Diagnosis not present

## 2020-03-13 DIAGNOSIS — I4891 Unspecified atrial fibrillation: Secondary | ICD-10-CM | POA: Diagnosis not present

## 2020-03-13 DIAGNOSIS — G903 Multi-system degeneration of the autonomic nervous system: Secondary | ICD-10-CM | POA: Diagnosis not present

## 2020-03-13 DIAGNOSIS — R55 Syncope and collapse: Secondary | ICD-10-CM | POA: Diagnosis not present

## 2020-03-13 DIAGNOSIS — I951 Orthostatic hypotension: Secondary | ICD-10-CM | POA: Diagnosis not present

## 2020-03-14 DIAGNOSIS — I951 Orthostatic hypotension: Secondary | ICD-10-CM | POA: Diagnosis not present

## 2020-03-14 DIAGNOSIS — I5022 Chronic systolic (congestive) heart failure: Secondary | ICD-10-CM | POA: Diagnosis not present

## 2020-03-14 DIAGNOSIS — I4891 Unspecified atrial fibrillation: Secondary | ICD-10-CM | POA: Diagnosis not present

## 2020-03-14 DIAGNOSIS — G903 Multi-system degeneration of the autonomic nervous system: Secondary | ICD-10-CM | POA: Diagnosis not present

## 2020-03-14 DIAGNOSIS — R55 Syncope and collapse: Secondary | ICD-10-CM | POA: Diagnosis not present

## 2020-03-14 DIAGNOSIS — G908 Other disorders of autonomic nervous system: Secondary | ICD-10-CM | POA: Diagnosis not present

## 2020-03-15 DIAGNOSIS — G908 Other disorders of autonomic nervous system: Secondary | ICD-10-CM | POA: Diagnosis not present

## 2020-03-15 DIAGNOSIS — I4891 Unspecified atrial fibrillation: Secondary | ICD-10-CM | POA: Diagnosis not present

## 2020-03-15 DIAGNOSIS — R55 Syncope and collapse: Secondary | ICD-10-CM | POA: Diagnosis not present

## 2020-03-15 DIAGNOSIS — G903 Multi-system degeneration of the autonomic nervous system: Secondary | ICD-10-CM | POA: Diagnosis not present

## 2020-03-15 DIAGNOSIS — I951 Orthostatic hypotension: Secondary | ICD-10-CM | POA: Diagnosis not present

## 2020-03-15 DIAGNOSIS — I5022 Chronic systolic (congestive) heart failure: Secondary | ICD-10-CM | POA: Diagnosis not present

## 2020-03-19 DIAGNOSIS — G903 Multi-system degeneration of the autonomic nervous system: Secondary | ICD-10-CM | POA: Diagnosis not present

## 2020-03-19 DIAGNOSIS — I4891 Unspecified atrial fibrillation: Secondary | ICD-10-CM | POA: Diagnosis not present

## 2020-03-19 DIAGNOSIS — R55 Syncope and collapse: Secondary | ICD-10-CM | POA: Diagnosis not present

## 2020-03-19 DIAGNOSIS — G908 Other disorders of autonomic nervous system: Secondary | ICD-10-CM | POA: Diagnosis not present

## 2020-03-19 DIAGNOSIS — I5022 Chronic systolic (congestive) heart failure: Secondary | ICD-10-CM | POA: Diagnosis not present

## 2020-03-19 DIAGNOSIS — I951 Orthostatic hypotension: Secondary | ICD-10-CM | POA: Diagnosis not present

## 2020-03-20 ENCOUNTER — Telehealth: Payer: Self-pay

## 2020-03-20 DIAGNOSIS — I5022 Chronic systolic (congestive) heart failure: Secondary | ICD-10-CM | POA: Diagnosis not present

## 2020-03-20 DIAGNOSIS — R55 Syncope and collapse: Secondary | ICD-10-CM | POA: Diagnosis not present

## 2020-03-20 DIAGNOSIS — I4891 Unspecified atrial fibrillation: Secondary | ICD-10-CM | POA: Diagnosis not present

## 2020-03-20 DIAGNOSIS — G908 Other disorders of autonomic nervous system: Secondary | ICD-10-CM | POA: Diagnosis not present

## 2020-03-20 DIAGNOSIS — I951 Orthostatic hypotension: Secondary | ICD-10-CM | POA: Diagnosis not present

## 2020-03-20 DIAGNOSIS — G903 Multi-system degeneration of the autonomic nervous system: Secondary | ICD-10-CM | POA: Diagnosis not present

## 2020-03-20 NOTE — Telephone Encounter (Signed)
Pts wife called and left a message on triage VM letting Dr Reece Agar know he was accepted at [facility] in [facility] for extended care treatment.

## 2020-03-20 NOTE — Telephone Encounter (Signed)
Noted. Thank you. I assume they mean hospice home in [place].  Will forward to Dr Jonny Ruiz as [place]ain Childes.

## 2020-03-21 DIAGNOSIS — G908 Other disorders of autonomic nervous system: Secondary | ICD-10-CM | POA: Diagnosis not present

## 2020-03-21 DIAGNOSIS — I951 Orthostatic hypotension: Secondary | ICD-10-CM | POA: Diagnosis not present

## 2020-03-21 DIAGNOSIS — R55 Syncope and collapse: Secondary | ICD-10-CM | POA: Diagnosis not present

## 2020-03-21 DIAGNOSIS — I5022 Chronic systolic (congestive) heart failure: Secondary | ICD-10-CM | POA: Diagnosis not present

## 2020-03-21 DIAGNOSIS — I4891 Unspecified atrial fibrillation: Secondary | ICD-10-CM | POA: Diagnosis not present

## 2020-03-21 DIAGNOSIS — G903 Multi-system degeneration of the autonomic nervous system: Secondary | ICD-10-CM | POA: Diagnosis not present

## 2020-03-22 DIAGNOSIS — G903 Multi-system degeneration of the autonomic nervous system: Secondary | ICD-10-CM | POA: Diagnosis not present

## 2020-03-22 DIAGNOSIS — R55 Syncope and collapse: Secondary | ICD-10-CM | POA: Diagnosis not present

## 2020-03-22 DIAGNOSIS — I5022 Chronic systolic (congestive) heart failure: Secondary | ICD-10-CM | POA: Diagnosis not present

## 2020-03-22 DIAGNOSIS — I951 Orthostatic hypotension: Secondary | ICD-10-CM | POA: Diagnosis not present

## 2020-03-22 DIAGNOSIS — I4891 Unspecified atrial fibrillation: Secondary | ICD-10-CM | POA: Diagnosis not present

## 2020-03-22 DIAGNOSIS — G908 Other disorders of autonomic nervous system: Secondary | ICD-10-CM | POA: Diagnosis not present

## 2020-03-23 DIAGNOSIS — I951 Orthostatic hypotension: Secondary | ICD-10-CM | POA: Diagnosis not present

## 2020-03-23 DIAGNOSIS — G903 Multi-system degeneration of the autonomic nervous system: Secondary | ICD-10-CM | POA: Diagnosis not present

## 2020-03-23 DIAGNOSIS — I4891 Unspecified atrial fibrillation: Secondary | ICD-10-CM | POA: Diagnosis not present

## 2020-03-23 DIAGNOSIS — G908 Other disorders of autonomic nervous system: Secondary | ICD-10-CM | POA: Diagnosis not present

## 2020-03-23 DIAGNOSIS — R55 Syncope and collapse: Secondary | ICD-10-CM | POA: Diagnosis not present

## 2020-03-23 DIAGNOSIS — I5022 Chronic systolic (congestive) heart failure: Secondary | ICD-10-CM | POA: Diagnosis not present

## 2020-03-24 DEATH — deceased

## 2020-04-02 ENCOUNTER — Telehealth: Payer: Self-pay | Admitting: Family Medicine

## 2020-04-02 NOTE — Telephone Encounter (Signed)
Received notice of patient's passing at hospice home in [place] on 02/28/2020.  Called daughter Rodman Pickle to express my condolences. Spoke with daughter.  Called wife [name] to express my condolences - unavailable at home #, cell phone not taking calls.  Please update chart.

## 2020-04-09 ENCOUNTER — Ambulatory Visit: Payer: Medicare HMO | Admitting: Neurology
